# Patient Record
Sex: Male | Born: 1960
Health system: Southern US, Community
[De-identification: ages and names within clinical notes are randomized; demographics above are authoritative.]

## PROBLEM LIST (undated history)

## (undated) DIAGNOSIS — F32A Depression, unspecified: Secondary | ICD-10-CM

## (undated) DIAGNOSIS — F419 Anxiety disorder, unspecified: Secondary | ICD-10-CM

## (undated) DIAGNOSIS — K5792 Diverticulitis of intestine, part unspecified, without perforation or abscess without bleeding: Secondary | ICD-10-CM

## (undated) DIAGNOSIS — F4310 Post-traumatic stress disorder, unspecified: Secondary | ICD-10-CM

## (undated) DIAGNOSIS — N400 Enlarged prostate without lower urinary tract symptoms: Secondary | ICD-10-CM

## (undated) DIAGNOSIS — I213 ST elevation (STEMI) myocardial infarction of unspecified site: Secondary | ICD-10-CM

## (undated) DIAGNOSIS — E7800 Pure hypercholesterolemia, unspecified: Secondary | ICD-10-CM

## (undated) DIAGNOSIS — I513 Intracardiac thrombosis, not elsewhere classified: Secondary | ICD-10-CM

## (undated) DIAGNOSIS — F329 Major depressive disorder, single episode, unspecified: Secondary | ICD-10-CM

## (undated) DIAGNOSIS — I10 Essential (primary) hypertension: Secondary | ICD-10-CM

## (undated) HISTORY — PX: KNEE SURGERY: SHX244

---

## 1998-01-10 ENCOUNTER — Other Ambulatory Visit: Admission: RE | Admit: 1998-01-10 | Discharge: 1998-01-10 | Payer: Self-pay | Admitting: Unknown Physician Specialty

## 1998-02-07 ENCOUNTER — Other Ambulatory Visit: Admission: RE | Admit: 1998-02-07 | Discharge: 1998-02-07 | Payer: Self-pay | Admitting: Unknown Physician Specialty

## 2001-10-01 ENCOUNTER — Emergency Department (HOSPITAL_COMMUNITY): Admission: EM | Admit: 2001-10-01 | Discharge: 2001-10-01 | Payer: Self-pay | Admitting: Emergency Medicine

## 2001-10-01 ENCOUNTER — Encounter: Payer: Self-pay | Admitting: Emergency Medicine

## 2001-10-09 ENCOUNTER — Emergency Department (HOSPITAL_COMMUNITY): Admission: EM | Admit: 2001-10-09 | Discharge: 2001-10-09 | Payer: Self-pay | Admitting: Emergency Medicine

## 2002-01-14 ENCOUNTER — Ambulatory Visit: Admission: RE | Admit: 2002-01-14 | Discharge: 2002-01-14 | Payer: Self-pay | Admitting: Orthopedic Surgery

## 2002-01-14 ENCOUNTER — Encounter: Payer: Self-pay | Admitting: Orthopedic Surgery

## 2004-08-20 ENCOUNTER — Ambulatory Visit: Payer: Self-pay | Admitting: Internal Medicine

## 2004-10-01 ENCOUNTER — Ambulatory Visit: Payer: Self-pay | Admitting: Internal Medicine

## 2004-11-02 ENCOUNTER — Emergency Department (HOSPITAL_COMMUNITY): Admission: EM | Admit: 2004-11-02 | Discharge: 2004-11-02 | Payer: Self-pay | Admitting: Emergency Medicine

## 2004-12-07 ENCOUNTER — Ambulatory Visit: Payer: Self-pay | Admitting: Internal Medicine

## 2004-12-28 ENCOUNTER — Ambulatory Visit: Payer: Self-pay | Admitting: Internal Medicine

## 2005-01-04 ENCOUNTER — Inpatient Hospital Stay (HOSPITAL_COMMUNITY): Admission: EM | Admit: 2005-01-04 | Discharge: 2005-01-06 | Payer: Self-pay | Admitting: Emergency Medicine

## 2005-01-04 ENCOUNTER — Ambulatory Visit (HOSPITAL_COMMUNITY): Admission: RE | Admit: 2005-01-04 | Discharge: 2005-01-04 | Payer: Self-pay | Admitting: Internal Medicine

## 2005-01-05 ENCOUNTER — Ambulatory Visit: Payer: Self-pay | Admitting: Internal Medicine

## 2005-01-23 ENCOUNTER — Ambulatory Visit: Payer: Self-pay | Admitting: Internal Medicine

## 2005-02-28 ENCOUNTER — Ambulatory Visit: Payer: Self-pay | Admitting: Internal Medicine

## 2005-03-01 ENCOUNTER — Encounter: Admission: RE | Admit: 2005-03-01 | Discharge: 2005-03-01 | Payer: Self-pay | Admitting: General Surgery

## 2005-05-20 ENCOUNTER — Ambulatory Visit: Payer: Self-pay | Admitting: Internal Medicine

## 2005-05-21 ENCOUNTER — Ambulatory Visit: Payer: Self-pay | Admitting: Internal Medicine

## 2005-07-22 ENCOUNTER — Ambulatory Visit: Payer: Self-pay | Admitting: Internal Medicine

## 2006-03-21 ENCOUNTER — Ambulatory Visit: Payer: Self-pay | Admitting: Internal Medicine

## 2006-06-27 ENCOUNTER — Ambulatory Visit: Payer: Self-pay | Admitting: Internal Medicine

## 2006-06-27 LAB — CONVERTED CEMR LAB
Hemoglobin, Urine: NEGATIVE
Nitrite: NEGATIVE
Specific Gravity, Urine: <=1.005 (ref 1.000–1.03)
Total Protein, Urine: NEGATIVE mg/dL
Urine Glucose: NEGATIVE mg/dL

## 2006-10-01 ENCOUNTER — Ambulatory Visit: Payer: Self-pay | Admitting: Internal Medicine

## 2006-12-31 ENCOUNTER — Ambulatory Visit: Payer: Self-pay | Admitting: Internal Medicine

## 2007-03-07 ENCOUNTER — Encounter: Payer: Self-pay | Admitting: Internal Medicine

## 2007-03-07 DIAGNOSIS — N508 Other specified disorders of male genital organs: Secondary | ICD-10-CM | POA: Insufficient documentation

## 2007-03-07 DIAGNOSIS — Z8719 Personal history of other diseases of the digestive system: Secondary | ICD-10-CM

## 2007-03-07 DIAGNOSIS — F4310 Post-traumatic stress disorder, unspecified: Secondary | ICD-10-CM

## 2007-03-07 DIAGNOSIS — K219 Gastro-esophageal reflux disease without esophagitis: Secondary | ICD-10-CM

## 2007-05-22 ENCOUNTER — Ambulatory Visit: Payer: Self-pay | Admitting: Internal Medicine

## 2007-05-22 LAB — CONVERTED CEMR LAB
AST: 28 units/L (ref 0–37)
Alkaline Phosphatase: 78 units/L (ref 39–117)
BUN: 13 mg/dL (ref 6–23)
Basophils Absolute: 0.1 10*3/uL (ref 0.0–0.1)
Calcium: 9.1 mg/dL (ref 8.4–10.5)
Crystals: NEGATIVE
Eosinophils Absolute: 0.3 10*3/uL (ref 0.0–0.6)
GFR calc non Af Amer: 85 mL/min
HDL: 25.1 mg/dL — ABNORMAL LOW (ref >39.0)
Hemoglobin, Urine: NEGATIVE
Hemoglobin: 15.2 g/dL (ref 13.0–17.0)
Ketones, ur: NEGATIVE mg/dL
Leukocytes, UA: NEGATIVE
Monocytes Absolute: 0.6 10*3/uL (ref 0.2–0.7)
Mucus, UA: NEGATIVE
Neutro Abs: 2.6 10*3/uL (ref 1.4–7.7)
Neutrophils Relative %: 44.8 % (ref 43.0–77.0)
Platelets: 264 10*3/uL (ref 150–400)
Potassium: 4.2 meq/L (ref 3.5–5.1)
RBC / HPF: NONE SEEN
Squamous Epithelial / LPF: NEGATIVE /lpf
Total Bilirubin: 0.6 mg/dL (ref 0.3–1.2)
Total CHOL/HDL Ratio: 6.9
Total Protein, Urine: NEGATIVE mg/dL
Total Protein: 7.1 g/dL (ref 6.0–8.3)
Triglycerides: 328 mg/dL (ref 0–149)
Urine Glucose: NEGATIVE mg/dL
Vitamin B-12: 440 pg/mL (ref 211–911)
WBC: 5.9 10*3/uL (ref 4.5–10.5)
pH: 5.5 (ref 5.0–8.0)

## 2007-06-03 ENCOUNTER — Encounter: Payer: Self-pay | Admitting: Internal Medicine

## 2007-06-03 ENCOUNTER — Ambulatory Visit: Payer: Self-pay | Admitting: Internal Medicine

## 2007-06-03 DIAGNOSIS — M25519 Pain in unspecified shoulder: Secondary | ICD-10-CM

## 2007-06-03 DIAGNOSIS — F329 Major depressive disorder, single episode, unspecified: Secondary | ICD-10-CM

## 2007-06-03 DIAGNOSIS — F3289 Other specified depressive episodes: Secondary | ICD-10-CM | POA: Insufficient documentation

## 2007-06-03 DIAGNOSIS — F418 Other specified anxiety disorders: Secondary | ICD-10-CM

## 2007-06-03 DIAGNOSIS — N390 Urinary tract infection, site not specified: Secondary | ICD-10-CM

## 2007-08-20 ENCOUNTER — Encounter: Payer: Self-pay | Admitting: Internal Medicine

## 2007-09-17 ENCOUNTER — Ambulatory Visit: Payer: Self-pay | Admitting: Internal Medicine

## 2007-09-21 LAB — CONVERTED CEMR LAB
Bilirubin Urine: NEGATIVE
Hemoglobin, Urine: NEGATIVE
Ketones, ur: NEGATIVE mg/dL
Nitrite: NEGATIVE
Total Protein, Urine: NEGATIVE mg/dL
Urine Glucose: NEGATIVE mg/dL
Urobilinogen, UA: 0.2 (ref 0.0–1.0)
pH: 6 (ref 5.0–8.0)

## 2007-09-23 ENCOUNTER — Ambulatory Visit: Payer: Self-pay | Admitting: Internal Medicine

## 2007-09-23 DIAGNOSIS — L570 Actinic keratosis: Secondary | ICD-10-CM

## 2007-11-19 ENCOUNTER — Encounter: Payer: Self-pay | Admitting: Internal Medicine

## 2008-01-01 ENCOUNTER — Encounter: Payer: Self-pay | Admitting: Internal Medicine

## 2008-02-18 ENCOUNTER — Ambulatory Visit: Payer: Self-pay | Admitting: Internal Medicine

## 2008-05-13 ENCOUNTER — Telehealth: Payer: Self-pay | Admitting: Internal Medicine

## 2008-06-01 ENCOUNTER — Telehealth: Payer: Self-pay | Admitting: Internal Medicine

## 2008-06-20 ENCOUNTER — Ambulatory Visit: Payer: Self-pay | Admitting: Internal Medicine

## 2008-06-21 LAB — CONVERTED CEMR LAB
BUN: 15 mg/dL (ref 6–23)
Calcium: 9.1 mg/dL (ref 8.4–10.5)
Creatinine, Ser: 0.9 mg/dL (ref 0.4–1.5)
GFR calc Af Amer: 115 mL/min
Ketones, ur: NEGATIVE mg/dL
Leukocytes, UA: NEGATIVE
Potassium: 3.9 meq/L (ref 3.5–5.1)
Specific Gravity, Urine: 1.02 (ref 1.000–1.03)
Total Protein, Urine: NEGATIVE mg/dL
Urine Glucose: NEGATIVE mg/dL
Urobilinogen, UA: 0.2 (ref 0.0–1.0)

## 2008-06-22 ENCOUNTER — Ambulatory Visit: Payer: Self-pay | Admitting: Internal Medicine

## 2008-06-22 DIAGNOSIS — N529 Male erectile dysfunction, unspecified: Secondary | ICD-10-CM

## 2009-01-16 ENCOUNTER — Telehealth: Payer: Self-pay | Admitting: Internal Medicine

## 2009-06-18 ENCOUNTER — Emergency Department (HOSPITAL_COMMUNITY): Admission: EM | Admit: 2009-06-18 | Discharge: 2009-06-18 | Payer: Self-pay | Admitting: Emergency Medicine

## 2009-06-30 ENCOUNTER — Ambulatory Visit: Payer: Self-pay | Admitting: Internal Medicine

## 2009-06-30 DIAGNOSIS — J069 Acute upper respiratory infection, unspecified: Secondary | ICD-10-CM | POA: Insufficient documentation

## 2009-06-30 DIAGNOSIS — S61209A Unspecified open wound of unspecified finger without damage to nail, initial encounter: Secondary | ICD-10-CM | POA: Insufficient documentation

## 2009-12-05 ENCOUNTER — Ambulatory Visit: Payer: Self-pay | Admitting: Internal Medicine

## 2010-02-28 ENCOUNTER — Telehealth: Payer: Self-pay | Admitting: Internal Medicine

## 2010-06-27 ENCOUNTER — Telehealth: Payer: Self-pay | Admitting: Internal Medicine

## 2010-09-02 ENCOUNTER — Encounter: Payer: Self-pay | Admitting: Internal Medicine

## 2010-09-13 NOTE — Progress Notes (Signed)
Summary: Rf Lorazepam  Phone Note Refill Request Message from:  Fax from Pharmacy  Refills Requested: Medication #1:  ATIVAN 1 MG  TABS 1 tab 2 - 3  times a day as needed   Dosage confirmed as above?Dosage Confirmed   Supply Requested: #90   Last Refilled: 01/29/2010  Method Requested: Telephone to Pharmacy Next Appointment Scheduled: None Initial call taken by: Lanier Prude, [facility]),  February 28, 2010 3:41 PM  Follow-up for Phone Call        ok 3 ref Follow-up by: Tresa Garter MD,  February 28, 2010 11:03 PM    Prescriptions: ATIVAN 1 MG  TABS (LORAZEPAM) 1 tab 2 - 3  times a day as needed  #90 x 3   Entered by:   Lamar Sprinkles, CMA   Authorized by:   Tresa Garter MD   Signed by:   Lamar Sprinkles, CMA on 03/01/2010   Method used:   Telephoned to ...       [facility]  [phone]* (retail)       [address] [place], [place]  [address]       Ph: [phone] or [phone]       Fax: [phone]   RxID:   [id number]

## 2010-09-13 NOTE — Progress Notes (Signed)
Summary: Rf Lorazepam  Phone Note Refill Request Message from:  Fax from Pharmacy  Refills Requested: Medication #1:  ATIVAN 1 MG  TABS 1 tab 2 - 3  times a day as needed   Dosage confirmed as above?Dosage Confirmed   Supply Requested: 90   Last Refilled: 05/29/2010  Method Requested: Telephone to Pharmacy Next Appointment Scheduled: none Initial call taken by: Lanier Prude, [facility]),  June 27, 2010 11:48 AM  Follow-up for Phone Call        0k 1 ref - needs ov Follow-up by: Tresa Garter MD,  June 27, 2010 12:39 PM  Additional Follow-up for Phone Call Additional follow up Details #1::        Rx called to pharmacy Additional Follow-up by: Lanier Prude, [facility]),  June 27, 2010 2:51 PM    Prescriptions: ATIVAN 1 MG  TABS (LORAZEPAM) 1 tab 2 - 3  times a day as needed  #90 x 0   Entered by:   Lanier Prude, CMA(AAMA)   Authorized by:   Tresa Garter MD   Signed by:   Lanier Prude, CMA(AAMA) on 06/27/2010   Method used:   Telephoned to ...       [facility]  [phone]* (retail)       [address] [place], [place]  [address]       Ph: [phone] or [phone]       Fax: [phone]   RxID:   [id number]

## 2010-12-25 NOTE — Assessment & Plan Note (Signed)
[facility]                           PRIMARY CARE OFFICE NOTE   JEFFERSON, FULLAM                     MRN:          [id number]  DATE:12/31/2006                            DOB:          12-Nov-1960    PROCEDURE:  Cryosurgery.   INDICATION:  Genital warts.  Risks, including a need for a repeat  procedure, infections,scars , and others as well as benefits explained  to the patient in detail and he agreed to proceed.  Three lesions on the  penile skin were treated with liquid nitrogen in the usual fashion.  Tolerated well.   COMPLICATIONS:  None.     Georgina Quint. Plotnikov, MD  Electronically Signed    AVP/MedQ  DD: 01/08/2007  DT: 01/08/2007  Job #: [id number]

## 2010-12-25 NOTE — Letter (Signed)
February 23, 2007    Trial Court Administrator  Attention:  Payton Mccallum Excuse Request  [address]  [place], [place] [address]   RE:  Henry David, Henry David  MRN:  [id number]  /  DOB:  [birth year]   Dr. Magdalen Spatz Court Administrator,   Please excuse my patient, Mr. Garland Hincapie, from jury duty on March 02, 2007, juror [phone] due to medical reasons.    Sincerely,      Georgina Quint. Plotnikov, MD  Electronically Signed    AVP/MedQ  DD: 02/23/2007  DT: 02/24/2007  Job #: [id number]

## 2010-12-28 NOTE — Assessment & Plan Note (Signed)
[facility]                             PRIMARY CARE OFFICE NOTE   Henry David, Henry David                     MRN:          [id number]  DATE:06/27/2006                            DOB:          [birth year]    PROCEDURE:  Irrigation.   INDICATION:  Severe wax impaction bilaterally.   Risks and benefits related to the patient in detail.  He agreed to proceed.  Both ears were irrigated with lukewarm water.  After a significant effort,  both ears were cleared from wax.  He tolerated it well.   COMPLICATIONS:  None.   In case he develops swimmers ear, given a prescription for Cortisporin otic  solution 3 drops each ear three times a day for 5 days.     Georgina Quint. Plotnikov, MD  Electronically Signed    AVP/MedQ  DD: 06/27/2006  DT: 06/27/2006  Job #: [id number]

## 2010-12-28 NOTE — Discharge Summary (Signed)
NAME:  Henry David, Henry David NO.:  [id number]   MEDICAL RECORD NO.:  [id number]          PATIENT TYPE:  INP   LOCATION:  1512                         FACILITY:  [facility]   PHYSICIAN:  Rosalyn Gess. Norins, M.D. [facility] OF BIRTH:  1960-09-24   DATE OF ADMISSION:  01/04/2005  DATE OF DISCHARGE:  01/06/2005                                 DISCHARGE SUMMARY   ADMISSION DIAGNOSIS:  Diverticulitis.   DISCHARGE DIAGNOSES:  Diverticulitis.   HISTORY OF PRESENT ILLNESS:  The patient is a 50 year old Caucasian male who  presented to the office with abdominal pain and fever two to three weeks  prior to admission.  He had an elevated white count at that time and was  treated empirically with Cipro for 10 days per possible UTI versus  diverticulitis.  The patient reports that his fever resolved and abdominal  pain improved but continued to be a problem.  He had increased myalgias and  abdominal pain with sluggishness.  The patient also complained of urinary  frequency.  The patient on CT scan of the date of admission did have what  appeared to be diverticulitis with microperforation and he subsequently  admitted to the hospital.   Please see the History and Physical for Past Medical History, Family History  and Social History.   HOSPITAL COURSE:  The patient was admitted to a regular bed.  He was seen in  consultation by general surgery who felt the patient did not require  surgical intervention but conservative medical management.  The patient was  started on Augmentin.  He had no fever.  He continued to do well with only  mild abdominal discomfort.  The patient was continued on Augmentin for a  full 36 to 48 hours.  On the date of discharge, his white count was 6900,  KUB was ordered and pending.  If negative for any increasing problems with  bowel wall thickening or increased free air he would be discharged to home  with oral course of antibiotics using Augmentin 875 mg b.i.d.   The patient was instructed in the size of diverticulitis as well as food  avoidance in regards to hard nondigestable products such as popcorn kernels  etc.   DISCHARGE EXAMINATION:  Temperature 98.6, blood pressure was stable.  Abdomen has positive bowel sounds noted.  The patient has mild tenderness to  palpation in the left lower quadrant.  Final laboratory with normal  hemoglobin and hematocrit with WBC of 6.9.  KUB is pending at the time of  dictation.   DISPOSITION:  The patient is discharged to home.  He is to follow up with  his primary care physician in 7-10 days.     MEN/MEDQ  D:  01/06/2005  T:  01/06/2005  Job:  [id number]   cc:   Dr. Trinna Post _____________  [facility] _________
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
## 2012-11-27 ENCOUNTER — Encounter (HOSPITAL_COMMUNITY): Payer: Self-pay | Admitting: Emergency Medicine

## 2012-11-27 ENCOUNTER — Emergency Department (HOSPITAL_COMMUNITY)
Admission: EM | Admit: 2012-11-27 | Discharge: 2012-11-27 | Disposition: A | Discharge status: Home / Self Care | Payer: Self-pay | Attending: Emergency Medicine | Admitting: Emergency Medicine

## 2012-11-27 DIAGNOSIS — Z77098 Contact with and (suspected) exposure to other hazardous, chiefly nonmedicinal, chemicals: Secondary | ICD-10-CM | POA: Insufficient documentation

## 2012-11-27 DIAGNOSIS — Z8659 Personal history of other mental and behavioral disorders: Secondary | ICD-10-CM | POA: Insufficient documentation

## 2012-11-27 DIAGNOSIS — F17200 Nicotine dependence, unspecified, uncomplicated: Secondary | ICD-10-CM | POA: Insufficient documentation

## 2012-11-27 DIAGNOSIS — Z8639 Personal history of other endocrine, nutritional and metabolic disease: Secondary | ICD-10-CM | POA: Insufficient documentation

## 2012-11-27 DIAGNOSIS — Z862 Personal history of diseases of the blood and blood-forming organs and certain disorders involving the immune mechanism: Secondary | ICD-10-CM | POA: Insufficient documentation

## 2012-11-27 HISTORY — DX: Major depressive disorder, single episode, unspecified: F32.9

## 2012-11-27 HISTORY — DX: Anxiety disorder, unspecified: F41.9

## 2012-11-27 HISTORY — DX: Depression, unspecified: F32.A

## 2012-11-27 HISTORY — DX: Post-traumatic stress disorder, unspecified: F43.10

## 2012-11-27 HISTORY — DX: Pure hypercholesterolemia, unspecified: E78.00

## 2012-11-27 NOTE — ED Provider Notes (Signed)
History    This chart was scribed for non-physician practitioner working with Gwyneth Sprout, MD by Toya Smothers, ED Scribe. This patient was seen in room TR05C/TR05C and the patient's care was started at 5:1 PM.  CSN: [id number]  Arrival date & time 11/27/12  1530   First MD Initiated Contact with Patient 11/27/12 1703      No chief complaint on file.  Patient is a 52 y.o. male presenting with eye problem. The history is provided by the patient. No language interpreter was used.  Eye Problem Location:  Both Quality:  Dull Severity:  Mild Onset quality:  Gradual Duration:  3 hours Timing:  Constant Progression:  Unchanged Chronicity:  New Context: chemical exposure   Relieved by:  Flushing Ineffective treatments:  None tried Associated symptoms: itching   Associated symptoms: no blurred vision, no decreased vision, no discharge, no double vision, no facial rash, no photophobia and no redness   Risk factors: no previous injury to eye     KAVAUGHN FAUCETT is a 52 y.o. male with h/o Anxiety, Depression, and PTSD, who presents to the ED c/o 3 hours of gradual onset, unchanged, mild eye irritation after thoroughly wiping his face and neck with a disposable germicidal sheet containing chlorine. Pt was taken to an eye wash station where symptoms became worse.No redness, itching, or visual disturbance. Pain has not been treated prior to arrival. No fever, chills, cough, congestion, rhinorrhea, chest pain, SOB, or n/v/d. Pt is a current everyday smoker admitting alcohol use, and denying illicit drug use.     Past Medical History  Diagnosis Date  . Anxiety   . Depression   . Post traumatic stress disorder   . Elevated cholesterol     Past Surgical History  Procedure Laterality Date  . Knee surgery      No family history on file.  History  Substance Use Topics  . Smoking status: Current Every Day Smoker  . Smokeless tobacco: Not on file  . Alcohol Use: Yes      Review of  Systems  Eyes: Positive for itching. Negative for blurred vision, double vision, photophobia, discharge and redness.  All other systems reviewed and are negative.    Allergies  Coconut fatty acids  Home Medications   Current Outpatient Rx  Name  Route  Sig  Dispense  Refill  . Ibuprofen (IBU PO)   Oral   Take 4 tablets by mouth daily as needed (pain).           BP 139/79  Pulse 90  Temp(Src) 98 F (36.7 C) (Oral)  SpO2 96%  Physical Exam  Nursing note and vitals reviewed. Constitutional: He is oriented to person, place, and time. He appears well-developed and well-nourished. No distress.  HENT:  Head: Normocephalic and atraumatic.  No swelling of the lids of either R or L eye.  Eyes: Conjunctivae and EOM are normal. Right eye exhibits no discharge. Left eye exhibits no discharge. No scleral icterus.  Neck: Neck supple. No tracheal deviation present.  Cardiovascular: Normal rate.   Capillary refill is less than 3 seconds.  Pulmonary/Chest: Effort normal. No respiratory distress. He has no wheezes. He has no rales.  Symmetrical rise and fall of the chest.  Musculoskeletal: Normal range of motion.  Neurological: He is alert and oriented to person, place, and time.  Skin: Skin is warm and dry.  No rash about the face or the neck. No rash on the hands.  Psychiatric: He has a  normal mood and affect. His behavior is normal.    ED Course  Procedures DIAGNOSTIC STUDIES: Oxygen Saturation is 96% on room air, normal by my interpretation.    COORDINATION OF CARE: 17:11- Evaluated Pt. Pt is awake, alert, and without distress. 17:17- Patient  understands and agrees with initial ED impression and plan with expectations set for ED visit.     Labs Reviewed - No data to display No results found.   No diagnosis found.    MDM  I have reviewed nursing notes, vital signs, and all appropriate lab and imaging results for this patient. Patient weighed hands, face, and neck  with industrial cleaner/sensitizer by mistake. Patient states that he had mild itching around the eyes and some redness. He received irrigation and flush prior to arriving in the fast track area. He states that he feels some better now. Patient states that he is somewhat anxious because he has not received his medications lately, as well as his mother is currently being treated for a possible stroke.  Visual acuity noted. No acute changes noted on examination. Patient is advised to continue to irrigate the face and eyes. He is to return to the emergency department if any changes, problems, or concerns.     *I personally performed the services described in this documentation, which was scribed in my presence. The recorded information has been reviewed and is accurate.  Kathie Dike, PA-C 11/27/12 1737

## 2012-11-27 NOTE — ED Notes (Signed)
PA at bedside.

## 2012-11-27 NOTE — ED Notes (Signed)
Pt visitor in the hospital. Wiped his face and eyes with germicidal wipes. Pt reportedly was taken to an eye wash station and irrigated his eyes. Eyes appear slightly reddened. Vision intact. Skin without blistering or significant redness.

## 2012-11-28 NOTE — ED Provider Notes (Signed)
Medical screening examination/treatment/procedure(s) were performed by non-physician practitioner and as supervising physician I was immediately available for consultation/collaboration.   Shyonna Carlin, MD 11/28/12 0030 

## 2015-07-09 ENCOUNTER — Inpatient Hospital Stay (HOSPITAL_COMMUNITY)
Admission: EM | Admit: 2015-07-09 | Discharge: 2015-07-13 | DRG: 247 | Disposition: A | Discharge status: Home / Self Care | Payer: Self-pay | Attending: Cardiovascular Disease | Admitting: Cardiovascular Disease

## 2015-07-09 ENCOUNTER — Encounter (HOSPITAL_COMMUNITY)
Admission: EM | Disposition: A | Discharge status: Home / Self Care | Payer: Self-pay | Source: Home / Self Care | Attending: Cardiovascular Disease

## 2015-07-09 ENCOUNTER — Encounter (HOSPITAL_COMMUNITY): Payer: Self-pay | Admitting: Emergency Medicine

## 2015-07-09 ENCOUNTER — Emergency Department (HOSPITAL_COMMUNITY): Payer: Self-pay

## 2015-07-09 DIAGNOSIS — I237 Postinfarction angina: Secondary | ICD-10-CM | POA: Diagnosis not present

## 2015-07-09 DIAGNOSIS — I2102 ST elevation (STEMI) myocardial infarction involving left anterior descending coronary artery: Principal | ICD-10-CM | POA: Diagnosis present

## 2015-07-09 DIAGNOSIS — Z91018 Allergy to other foods: Secondary | ICD-10-CM

## 2015-07-09 DIAGNOSIS — E785 Hyperlipidemia, unspecified: Secondary | ICD-10-CM | POA: Diagnosis present

## 2015-07-09 DIAGNOSIS — F329 Major depressive disorder, single episode, unspecified: Secondary | ICD-10-CM | POA: Diagnosis present

## 2015-07-09 DIAGNOSIS — I213 ST elevation (STEMI) myocardial infarction of unspecified site: Secondary | ICD-10-CM

## 2015-07-09 DIAGNOSIS — R7303 Prediabetes: Secondary | ICD-10-CM | POA: Diagnosis present

## 2015-07-09 DIAGNOSIS — I10 Essential (primary) hypertension: Secondary | ICD-10-CM | POA: Diagnosis present

## 2015-07-09 DIAGNOSIS — I25119 Atherosclerotic heart disease of native coronary artery with unspecified angina pectoris: Secondary | ICD-10-CM | POA: Diagnosis present

## 2015-07-09 DIAGNOSIS — F418 Other specified anxiety disorders: Secondary | ICD-10-CM | POA: Diagnosis present

## 2015-07-09 DIAGNOSIS — I2510 Atherosclerotic heart disease of native coronary artery without angina pectoris: Secondary | ICD-10-CM

## 2015-07-09 DIAGNOSIS — Z7982 Long term (current) use of aspirin: Secondary | ICD-10-CM

## 2015-07-09 DIAGNOSIS — Z9861 Coronary angioplasty status: Secondary | ICD-10-CM

## 2015-07-09 DIAGNOSIS — F4310 Post-traumatic stress disorder, unspecified: Secondary | ICD-10-CM | POA: Diagnosis present

## 2015-07-09 DIAGNOSIS — E7800 Pure hypercholesterolemia, unspecified: Secondary | ICD-10-CM | POA: Diagnosis present

## 2015-07-09 DIAGNOSIS — Z955 Presence of coronary angioplasty implant and graft: Secondary | ICD-10-CM | POA: Diagnosis not present

## 2015-07-09 DIAGNOSIS — Z79899 Other long term (current) drug therapy: Secondary | ICD-10-CM

## 2015-07-09 DIAGNOSIS — F17210 Nicotine dependence, cigarettes, uncomplicated: Secondary | ICD-10-CM | POA: Diagnosis present

## 2015-07-09 DIAGNOSIS — F411 Generalized anxiety disorder: Secondary | ICD-10-CM | POA: Diagnosis present

## 2015-07-09 DIAGNOSIS — I25118 Atherosclerotic heart disease of native coronary artery with other forms of angina pectoris: Secondary | ICD-10-CM | POA: Diagnosis present

## 2015-07-09 HISTORY — PX: CARDIAC CATHETERIZATION: SHX172

## 2015-07-09 LAB — BASIC METABOLIC PANEL
ANION GAP: 10 (ref 5–15)
BUN: 17 mg/dL (ref 6–20)
CHLORIDE: 100 mmol/L — AB (ref 101–111)
CO2: 25 mmol/L (ref 22–32)
Calcium: 9.9 mg/dL (ref 8.9–10.3)
Creatinine, Ser: 0.95 mg/dL (ref 0.61–1.24)
GFR calc non Af Amer: >60 mL/min (ref >60)
Glucose, Bld: 122 mg/dL — ABNORMAL HIGH (ref 65–99)
POTASSIUM: 4.1 mmol/L (ref 3.5–5.1)
SODIUM: 135 mmol/L (ref 135–145)

## 2015-07-09 LAB — CBC
HEMATOCRIT: 46.8 % (ref 39.0–52.0)
HEMOGLOBIN: 16.3 g/dL (ref 13.0–17.0)
MCH: 30.6 pg (ref 26.0–34.0)
MCHC: 34.8 g/dL (ref 30.0–36.0)
MCV: 87.8 fL (ref 78.0–100.0)
PLATELETS: 276 10*3/uL (ref 150–400)
RBC: 5.33 MIL/uL (ref 4.22–5.81)
RDW: 12.9 % (ref 11.5–15.5)
WBC: 9.2 10*3/uL (ref 4.0–10.5)

## 2015-07-09 LAB — TROPONIN I
TROPONIN I: 0.22 ng/mL — AB (ref <0.031)
TROPONIN I: 0.98 ng/mL — AB (ref <0.031)
Troponin I: 0.1 ng/mL — ABNORMAL HIGH (ref <0.031)

## 2015-07-09 LAB — D-DIMER, QUANTITATIVE (NOT AT ARMC): D DIMER QUANT: 0.34 ug{FEU}/mL (ref 0.00–0.50)

## 2015-07-09 LAB — APTT: APTT: 31 s (ref 24–37)

## 2015-07-09 LAB — I-STAT TROPONIN, ED: Troponin i, poc: 0.09 ng/mL (ref 0.00–0.08)

## 2015-07-09 LAB — PROTIME-INR
INR: 0.93 (ref 0.00–1.49)
Prothrombin Time: 12.7 seconds (ref 11.6–15.2)

## 2015-07-09 SURGERY — LEFT HEART CATH AND CORONARY ANGIOGRAPHY
Anesthesia: LOCAL

## 2015-07-09 MED ORDER — SODIUM CHLORIDE 0.9 % IJ SOLN: 3.0000 mL | INTRAMUSCULAR | Status: DC | PRN

## 2015-07-09 MED ORDER — MIDAZOLAM HCL 2 MG/2ML IJ SOLN
INTRAMUSCULAR | Status: DC | PRN
  Administered 2015-07-09: 1 mg via INTRAVENOUS

## 2015-07-09 MED ORDER — NITROGLYCERIN 1 MG/10 ML FOR IR/CATH LAB
INTRA_ARTERIAL | Status: DC | PRN
  Administered 2015-07-09: 21:00:00

## 2015-07-09 MED ORDER — ONDANSETRON HCL 4 MG/2ML IJ SOLN
4.0000 mg | Freq: Four times a day (QID) | INTRAMUSCULAR | Status: DC | PRN
  Administered 2015-07-09 – 2015-07-11 (×5): 4 mg via INTRAVENOUS
  Filled 2015-07-09 (×5): qty 2

## 2015-07-09 MED ORDER — HEPARIN SODIUM (PORCINE) 5000 UNIT/ML IJ SOLN
5000.0000 [IU] | Freq: Once | INTRAMUSCULAR | Status: AC
  Administered 2015-07-09: 5000 [IU] via SUBCUTANEOUS

## 2015-07-09 MED ORDER — ASPIRIN EC 81 MG PO TBEC
81.0000 mg | DELAYED_RELEASE_TABLET | Freq: Every day | ORAL | Status: DC
  Administered 2015-07-10: 81 mg via ORAL
  Filled 2015-07-09: qty 1

## 2015-07-09 MED ORDER — LISINOPRIL 2.5 MG PO TABS
2.5000 mg | ORAL_TABLET | Freq: Every day | ORAL | Status: DC
Start: 2015-07-10 — End: 2015-07-10
  Administered 2015-07-10: 2.5 mg via ORAL
  Filled 2015-07-09: qty 1

## 2015-07-09 MED ORDER — METOPROLOL TARTRATE 12.5 MG HALF TABLET
12.5000 mg | ORAL_TABLET | Freq: Two times a day (BID) | ORAL | Status: DC
  Administered 2015-07-09 – 2015-07-10 (×2): 12.5 mg via ORAL
  Filled 2015-07-09 (×2): qty 1

## 2015-07-09 MED ORDER — ACETAMINOPHEN 325 MG PO TABS
650.0000 mg | ORAL_TABLET | ORAL | Status: DC | PRN
  Administered 2015-07-11 – 2015-07-12 (×4): 650 mg via ORAL
  Filled 2015-07-09 (×4): qty 2

## 2015-07-09 MED ORDER — NITROGLYCERIN 0.4 MG SL SUBL
0.4000 mg | SUBLINGUAL_TABLET | SUBLINGUAL | Status: AC | PRN
  Administered 2015-07-09 (×3): 0.4 mg via SUBLINGUAL
  Filled 2015-07-09: qty 1

## 2015-07-09 MED ORDER — MORPHINE SULFATE (PF) 2 MG/ML IV SOLN
2.0000 mg | INTRAVENOUS | Status: DC | PRN
Start: 2015-07-09 — End: 2015-07-13
  Administered 2015-07-09 – 2015-07-10 (×2): 4 mg via INTRAVENOUS
  Administered 2015-07-10 – 2015-07-12 (×8): 2 mg via INTRAVENOUS
  Filled 2015-07-09: qty 2
  Filled 2015-07-09 (×2): qty 1
  Filled 2015-07-09: qty 2
  Filled 2015-07-09 (×7): qty 1

## 2015-07-09 MED ORDER — NITROGLYCERIN IN D5W 200-5 MCG/ML-% IV SOLN: 0.0000 ug/min | INTRAVENOUS | Status: DC

## 2015-07-09 MED ORDER — TICAGRELOR 90 MG PO TABS
ORAL_TABLET | ORAL | Status: DC | PRN
  Administered 2015-07-09: 180 mg via ORAL

## 2015-07-09 MED ORDER — BIVALIRUDIN 250 MG IV SOLR
INTRAVENOUS | Status: AC
  Filled 2015-07-09: qty 250

## 2015-07-09 MED ORDER — NITROGLYCERIN IN D5W 200-5 MCG/ML-% IV SOLN: 5.0000 ug/min | INTRAVENOUS | Status: DC

## 2015-07-09 MED ORDER — ASPIRIN 81 MG PO CHEW
CHEWABLE_TABLET | ORAL | Status: AC
  Filled 2015-07-09: qty 4

## 2015-07-09 MED ORDER — FENTANYL CITRATE (PF) 100 MCG/2ML IJ SOLN
INTRAMUSCULAR | Status: DC | PRN
  Administered 2015-07-09 (×2): 25 ug via INTRAVENOUS

## 2015-07-09 MED ORDER — ASPIRIN 81 MG PO CHEW
324.0000 mg | CHEWABLE_TABLET | Freq: Once | ORAL | Status: AC
Start: 2015-07-09 — End: 2015-07-09
  Administered 2015-07-09: 324 mg via ORAL

## 2015-07-09 MED ORDER — HEPARIN (PORCINE) IN NACL 2-0.9 UNIT/ML-% IJ SOLN
INTRAMUSCULAR | Status: AC
  Filled 2015-07-09: qty 1000

## 2015-07-09 MED ORDER — SODIUM CHLORIDE 0.9 % IJ SOLN
3.0000 mL | Freq: Two times a day (BID) | INTRAMUSCULAR | Status: DC
  Administered 2015-07-10 – 2015-07-11 (×4): 3 mL via INTRAVENOUS

## 2015-07-09 MED ORDER — TICAGRELOR 90 MG PO TABS
ORAL_TABLET | ORAL | Status: AC
Start: 2015-07-09 — End: 2015-07-09
  Filled 2015-07-09: qty 1

## 2015-07-09 MED ORDER — ATORVASTATIN CALCIUM 80 MG PO TABS
80.0000 mg | ORAL_TABLET | Freq: Every day | ORAL | Status: DC
  Administered 2015-07-10 – 2015-07-12 (×3): 80 mg via ORAL
  Filled 2015-07-09 (×4): qty 1

## 2015-07-09 MED ORDER — SODIUM CHLORIDE 0.9 % IV SOLN
1.7500 mg/kg/h | INTRAVENOUS | Status: AC
  Administered 2015-07-09 – 2015-07-10 (×2): 1.75 mg/kg/h via INTRAVENOUS
  Filled 2015-07-09 (×2): qty 250

## 2015-07-09 MED ORDER — HEPARIN (PORCINE) IN NACL 100-0.45 UNIT/ML-% IJ SOLN: 12.0000 [IU]/kg/h | INTRAMUSCULAR | Status: DC

## 2015-07-09 MED ORDER — MORPHINE SULFATE (PF) 4 MG/ML IV SOLN
INTRAVENOUS | Status: AC
  Filled 2015-07-09: qty 1

## 2015-07-09 MED ORDER — HEPARIN (PORCINE) IN NACL 100-0.45 UNIT/ML-% IJ SOLN
1000.0000 [IU]/h | INTRAMUSCULAR | Status: DC
Start: 2015-07-09 — End: 2015-07-09
  Filled 2015-07-09: qty 250

## 2015-07-09 MED ORDER — NITROGLYCERIN IN D5W 200-5 MCG/ML-% IV SOLN
0.0000 ug/min | INTRAVENOUS | Status: DC
  Administered 2015-07-09: 5 ug/min via INTRAVENOUS

## 2015-07-09 MED ORDER — SODIUM CHLORIDE 0.9 % IV SOLN: 250.0000 mL | INTRAVENOUS | Status: DC | PRN

## 2015-07-09 MED ORDER — LIDOCAINE HCL (PF) 1 % IJ SOLN
INTRAMUSCULAR | Status: AC
  Filled 2015-07-09: qty 30

## 2015-07-09 MED ORDER — SODIUM CHLORIDE 0.9 % IV SOLN
250.0000 mg | INTRAVENOUS | Status: DC | PRN
  Administered 2015-07-09 (×2): 1.75 mg/kg/h via INTRAVENOUS

## 2015-07-09 MED ORDER — MORPHINE SULFATE (PF) 4 MG/ML IV SOLN
4.0000 mg | Freq: Once | INTRAVENOUS | Status: AC
  Administered 2015-07-09: 4 mg via INTRAVENOUS

## 2015-07-09 MED ORDER — NITROGLYCERIN 1 MG/10 ML FOR IR/CATH LAB
INTRA_ARTERIAL | Status: AC
  Filled 2015-07-09: qty 10

## 2015-07-09 MED ORDER — IOHEXOL 350 MG/ML SOLN
INTRAVENOUS | Status: DC | PRN
  Administered 2015-07-09: 345 mL via INTRAVENOUS

## 2015-07-09 MED ORDER — BIVALIRUDIN BOLUS VIA INFUSION - CUPID
INTRAVENOUS | Status: DC | PRN
  Administered 2015-07-09: 70.725 mg via INTRAVENOUS

## 2015-07-09 MED ORDER — HEPARIN SODIUM (PORCINE) 5000 UNIT/ML IJ SOLN: 60.0000 [IU]/kg | Freq: Once | INTRAMUSCULAR | Status: DC

## 2015-07-09 MED ORDER — TICAGRELOR 90 MG PO TABS
90.0000 mg | ORAL_TABLET | Freq: Two times a day (BID) | ORAL | Status: DC
  Administered 2015-07-10: 90 mg via ORAL
  Filled 2015-07-09: qty 1

## 2015-07-09 MED ORDER — TICAGRELOR 90 MG PO TABS
ORAL_TABLET | ORAL | Status: AC
  Filled 2015-07-09: qty 1

## 2015-07-09 MED ORDER — FENTANYL CITRATE (PF) 100 MCG/2ML IJ SOLN
INTRAMUSCULAR | Status: AC
  Filled 2015-07-09: qty 2

## 2015-07-09 MED ORDER — HEPARIN BOLUS VIA INFUSION
4000.0000 [IU] | Freq: Once | INTRAVENOUS | Status: DC
  Filled 2015-07-09: qty 4000

## 2015-07-09 MED ORDER — LIDOCAINE HCL (PF) 1 % IJ SOLN
INTRAMUSCULAR | Status: DC | PRN
  Administered 2015-07-09: 20 mL

## 2015-07-09 MED ORDER — HEPARIN (PORCINE) IN NACL 2-0.9 UNIT/ML-% IJ SOLN
INTRAMUSCULAR | Status: DC | PRN
  Administered 2015-07-09: 20:00:00

## 2015-07-09 MED ORDER — MORPHINE SULFATE (PF) 4 MG/ML IV SOLN: 4.0000 mg | Freq: Once | INTRAVENOUS | Status: DC

## 2015-07-09 MED ORDER — METOPROLOL TARTRATE 1 MG/ML IV SOLN
INTRAVENOUS | Status: AC
  Filled 2015-07-09: qty 5

## 2015-07-09 MED ORDER — MORPHINE SULFATE (PF) 2 MG/ML IV SOLN
INTRAVENOUS | Status: AC
  Filled 2015-07-09: qty 1

## 2015-07-09 MED ORDER — MIDAZOLAM HCL 2 MG/2ML IJ SOLN
INTRAMUSCULAR | Status: AC
  Filled 2015-07-09: qty 2

## 2015-07-09 MED ORDER — SODIUM CHLORIDE 0.9 % IV SOLN
INTRAVENOUS | Status: DC
  Administered 2015-07-09 – 2015-07-10 (×2): via INTRAVENOUS

## 2015-07-09 MED ORDER — NITROGLYCERIN IN D5W 200-5 MCG/ML-% IV SOLN
INTRAVENOUS | Status: AC
  Filled 2015-07-09: qty 250

## 2015-07-09 SURGICAL SUPPLY — 19 items
BALLN EUPHORA RX 2.5X12 (BALLOONS) ×2
BALLN EUPHORA RX 3.0X15 (BALLOONS) ×2
BALLN ~~LOC~~ TREK RX 3.0X12 (BALLOONS) ×2
BALLOON EUPHORA RX 2.5X12 (BALLOONS) IMPLANT
BALLOON EUPHORA RX 3.0X15 (BALLOONS) IMPLANT
BALLOON ~~LOC~~ TREK RX 3.0X12 (BALLOONS) IMPLANT
CATH INFINITI 5FR MULTPACK ANG (CATHETERS) ×1 IMPLANT
CATH VISTA GUIDE 6FR XBLAD3.5 (CATHETERS) ×1 IMPLANT
KIT ENCORE 26 ADVANTAGE (KITS) ×1 IMPLANT
KIT HEART LEFT (KITS) ×2 IMPLANT
PACK CARDIAC CATHETERIZATION (CUSTOM PROCEDURE TRAY) ×2 IMPLANT
SHEATH PINNACLE 6F 10CM (SHEATH) ×1 IMPLANT
STENT XIENCE ALPINE RX 2.75X18 (Permanent Stent) ×1 IMPLANT
SYR MEDRAD MARK V 150ML (SYRINGE) ×2 IMPLANT
TRANSDUCER W/STOPCOCK (MISCELLANEOUS) ×2 IMPLANT
TUBING CIL FLEX 10 FLL-RA (TUBING) ×2 IMPLANT
WIRE COUGAR XT STRL 190CM (WIRE) ×1 IMPLANT
WIRE EMERALD 3MM-J .035X150CM (WIRE) ×1 IMPLANT
WIRE PT2 MS 185 (WIRE) ×1 IMPLANT

## 2015-07-09 NOTE — ED Notes (Signed)
MD at bedside. 

## 2015-07-09 NOTE — H&P (Signed)
Chief Complaint:  [name] is a 30 white male who was transferred from [facility] long emergency room for emergent cardiac catheterization with anterolateral ST segment elevation myocardial infarction.   HPI:  [name] denies any known prior cardiac history.  He has a history of anxiety and depression and stress disorder.  He has not seen a physician in years.  In the past.  He was told of having high cholesterol but has not never received treatment.  Reactive tobacco use.  Today between noon and 1 PM, he developed chest discomfort.  He initially presented to [facility] ER at ~ 4 PM.  Initially was felt that his chest pain was somewhat atypical and he was not taken back to be evaluated immediately.  His initial ECG did not show any definitive diagnostic changes, but there was a suggestion of subtle inferior J-point elevation.  His point of care marker came back minimally positive.  Several hours later.  Subsequent ECG was done with troponin level and his ECG now showed ST elevation anterolaterally code STEMI was called.  He was transported emergently to [facility] catheterization laboratory for emergent cardiac catheterization.  PMHx:  Past Medical History  Diagnosis Date  . Anxiety   . Depression   . Post traumatic stress disorder   . Elevated cholesterol     Past Surgical History  Procedure Laterality Date  . Knee surgery      FAMHx:  History reviewed. No pertinent family history.  SOCHx:   reports that he has been smoking.  He does not have any smokeless tobacco history on file. He reports that he drinks alcohol. He reports that he does not use illicit drugs.  ALLERGIES:  Allergies  Allergen Reactions  . Coconut Fatty Acids     unknown    ROS: General: Negative; No fevers, chills, or night sweats;  HEENT: Negative; No changes in vision or hearing, sinus congestion, difficulty swallowing Pulmonary: Negative; No cough, wheezing, shortness of breath,  hemoptysis Cardiovascular:  See HPI GI: Negative; No nausea, vomiting, diarrhea, or abdominal pain GU: Negative; No dysuria, hematuria, or difficulty voiding Musculoskeletal: Negative; no myalgias, joint pain, or weakness Hematologic/Oncology: Negative; no easy bruising, bleeding Endocrine: Negative; no heat/cold intolerance; no diabetes Neuro: Negative; no changes in balance, headaches Skin: Negative; No rashes or skin lesions Psychiatric: Positive for depression depression Sleep: Negative; No snoring, daytime sleepiness, hypersomnolence, bruxism, restless legs, hypnogognic hallucinations, no cataplexy Other comprehensive 14 point system review is negative.  HOME MEDS: Medications Prior to Admission  Medication Sig Dispense Refill  . aspirin 81 MG tablet Take 2-3 mg by mouth once.    . Doxylamine Succinate, Sleep, (SLEEP AID PO) Take 1 tablet by mouth at bedtime as needed (sleep).    Marland Kitchen ibuprofen (ADVIL,MOTRIN) 200 MG tablet Take 200 mg by mouth every 6 (six) hours as needed for moderate pain.    . Pseudoephedrine HCl (DECONGESTANT PO) Take 1-2 tablets by mouth daily as needed (help breathe).      LABS/IMAGING: Results for orders placed or performed during the hospital encounter of 07/09/15 (from the past 48 hour(s))  Basic metabolic panel     Status: Abnormal   Collection Time: 07/09/15  5:25 PM  Result Value Ref Range   Sodium 135 135 - 145 mmol/L   Potassium 4.1 3.5 - 5.1 mmol/L   Chloride 100 (L) 101 - 111 mmol/L   CO2 25 22 - 32 mmol/L   Glucose, Bld 122 (H) 65 - 99 mg/dL  BUN 17 6 - 20 mg/dL   Creatinine, Ser 0.95 0.61 - 1.24 mg/dL   Calcium 9.9 8.9 - 10.3 mg/dL   GFR calc non Af Amer >60 >60 mL/min   GFR calc Af Amer >60 >60 mL/min    Comment: (NOTE) The eGFR has been calculated using the CKD EPI equation. This calculation has not been validated in all clinical situations. eGFR's persistently <60 mL/min signify possible Chronic Kidney Disease.    Anion gap 10 5 - 15   CBC     Status: None   Collection Time: 07/09/15  5:25 PM  Result Value Ref Range   WBC 9.2 4.0 - 10.5 K/uL   RBC 5.33 4.22 - 5.81 MIL/uL   Hemoglobin 16.3 13.0 - 17.0 g/dL   HCT 46.8 39.0 - 52.0 %   MCV 87.8 78.0 - 100.0 fL   MCH 30.6 26.0 - 34.0 pg   MCHC 34.8 30.0 - 36.0 g/dL   RDW 12.9 11.5 - 15.5 %   Platelets 276 150 - 400 K/uL  D-dimer, quantitative (not at [facility])     Status: None   Collection Time: 07/09/15  5:25 PM  Result Value Ref Range   D-Dimer, Quant 0.34 0.00 - 0.50 ug/mL-FEU    Comment: Please note change in reference range. (NOTE) At the manufacturer cut-off of 0.50 ug/mL FEU, this assay has been documented to exclude PE with a sensitivity and negative predictive value of 97 to 99%.  At this time, this assay has not been approved by the FDA to exclude DVT/VTE. Results should be correlated with clinical presentation.   Troponin I     Status: Abnormal   Collection Time: 07/09/15  5:25 PM  Result Value Ref Range   Troponin I 0.10 (H) <0.031 ng/mL    Comment:        PERSISTENTLY INCREASED TROPONIN VALUES IN THE RANGE OF 0.04-0.49 ng/mL CAN BE SEEN IN:       -UNSTABLE ANGINA       -CONGESTIVE HEART FAILURE       -MYOCARDITIS       -CHEST TRAUMA       -ARRYHTHMIAS       -LATE PRESENTING MYOCARDIAL INFARCTION       -COPD   CLINICAL FOLLOW-UP RECOMMENDED.   I-stat troponin, ED (not at [facility])     Status: Abnormal   Collection Time: 07/09/15  5:55 PM  Result Value Ref Range   Troponin i, poc 0.09 (HH) 0.00 - 0.08 ng/mL   Comment NOTIFIED PHYSICIAN    Comment 3            Comment: Due to the release kinetics of cTnI, a negative result within the first hours of the onset of symptoms does not rule out myocardial infarction with certainty. If myocardial infarction is still suspected, repeat the test at appropriate intervals.   APTT     Status: None   Collection Time: 07/09/15  7:42 PM  Result Value Ref Range   aPTT 31 24 - 37 seconds  Protime-INR      Status: None   Collection Time: 07/09/15  7:42 PM  Result Value Ref Range   Prothrombin Time 12.7 11.6 - 15.2 seconds   INR 0.93 0.00 - 1.49  Troponin I     Status: Abnormal   Collection Time: 07/09/15  7:42 PM  Result Value Ref Range   Troponin I 0.22 (H) <0.031 ng/mL    Comment:  PERSISTENTLY INCREASED TROPONIN VALUES IN THE RANGE OF 0.04-0.49 ng/mL CAN BE SEEN IN:       -UNSTABLE ANGINA       -CONGESTIVE HEART FAILURE       -MYOCARDITIS       -CHEST TRAUMA       -ARRYHTHMIAS       -LATE PRESENTING MYOCARDIAL INFARCTION       -COPD   CLINICAL FOLLOW-UP RECOMMENDED.    Dg Chest 2 View  07/09/2015  CLINICAL DATA:  Left-sided chest pain EXAM: CHEST  2 VIEW COMPARISON:  None. FINDINGS: The mild cardiac silhouette enlargement. Normal vascular pattern. Lungs clear. IMPRESSION: Mild enlargement of cardiac silhouette.  Otherwise negative. Electronically Signed   By: Skipper Cliche M.D.   On: 07/09/2015 19:13    VITALS: Blood pressure 140/91, pulse 92, temperature 97.8 F (36.6 C), temperature source Oral, resp. rate 11, height $RemoveBe'5\' 11"'ILhnuocCo$  (1.803 m), weight 208 lb (94.348 kg), SpO2 99 %.  EXAM: General appearance: alert, cooperative and Complaining of 9 out of 10 chest pain on initial presentation. Neck: no adenopathy, no carotid bruit, no JVD, supple, symmetrical, trachea midline and thyroid not enlarged, symmetric, no tenderness/mass/nodules Lungs: No wheezing or rhonchi Heart: regular rate and rhythm and .  No S3 or S4. Abdomen: soft, non-tender; bowel sounds normal; no masses,  no organomegaly Extremities: no edema, redness or tenderness in the calves or thighs Pulses: 2+ and symmetric Skin: Skin color, texture, turgor normal. No rashes or lesions Neurologic: Grossly normal   ECG at 8:01 pm(independently read by me): Acute anterolateral injury current. V3- through V5 and 0.5-1 mm inferior ST elevation inferiorly  [facility] (independently reviewed by me):  Normal sinus rhythm at 80.  Less than 1 mm J-point elevation inferiorly.  QS complex V1 V2.  IMPRESSION: 1. Acute coronary syndrome with acute ST segment anterolateral myocardial infarction, most likely due to LAD/diagonal disease.  However, the patient also has subtle inferior J-point elevation of < 47mm.   2.  History of hyperlipidemia  3.  History of anxiety/depression   PLAN: Emergent cardiac catheterization and possible coronary intervention.  Troy Sine, MD, [facility] 07/09/2015  10:42 PM

## 2015-07-09 NOTE — Progress Notes (Signed)
ANTICOAGULATION CONSULT NOTE Pharmacy Consult for heparin Indication: CAD s/p PCI  Allergies  Allergen Reactions  . Coconut Fatty Acids     unknown    Patient Measurements: Height: 5\' 11"  (180.3 cm) Weight: 208 lb (94.348 kg) (patient estimate) IBW/kg (Calculated) : 75.3  Vital Signs: Temp: 97.8 F (36.6 C) (11/27 2011) Temp Source: Oral (11/27 2011) BP: 140/91 mmHg (11/27 2151) Pulse Rate: 92 (11/27 2151)  Labs:  Recent Labs  07/09/15 1725 07/09/15 1942  HGB 16.3  --   HCT 46.8  --   PLT 276  --   APTT  --  31  LABPROT  --  12.7  INR  --  0.93  CREATININE 0.95  --   TROPONINI 0.10* 0.22*    Estimated Creatinine Clearance: 101.8 mL/min (by C-G formula based on Cr of 0.95).  Assessment: 54 yo male with STEMI s/p PCI for anticoagulation.  Angiomax to continue for total of 4 hours.  Heparin to be resumed 8 hours after sheath removal while awaiting staged PCI  Goal of Therapy:  Heparin level 0.3-0.7 units/ml Monitor platelets by anticoagulation protocol: Yes   Plan:  F/U sheath removal later tonight, then restart heparin 1350 units/hr . Check heparin level 8 hours after resuming.   Geannie Risen, PharmD, BCPS  07/09/2015 10:44 PM

## 2015-07-09 NOTE — ED Notes (Signed)
Pt transferred from [facility]; Pt c/o of left chest pain 9/10; pt A&O on arrival;

## 2015-07-09 NOTE — ED Provider Notes (Addendum)
CSN: [id number]     Arrival date & time 07/09/15  1600 History   First MD Initiated Contact with Patient 07/09/15 1815     Chief Complaint  Patient presents with  . Chest Pain     (Consider location/radiation/quality/duration/timing/severity/associated sxs/prior Treatment) Patient is a 54 y.o. male presenting with chest pain.  Chest Pain Pain location:  Substernal area Pain quality: sharp   Pain radiates to:  Does not radiate Pain radiates to the back: no   Pain severity:  Moderate Onset quality:  Gradual Duration:  5 hours Timing:  Constant Progression:  Unchanged Chronicity:  New Context: at rest   Relieved by:  Nothing Worsened by:  Coughing and deep breathing Ineffective treatments:  None tried Associated symptoms: no back pain, no cough, no nausea, no numbness, no shortness of breath and not vomiting     Past Medical History  Diagnosis Date  . Anxiety   . Depression   . Post traumatic stress disorder   . Elevated cholesterol    Past Surgical History  Procedure Laterality Date  . Knee surgery     History reviewed. No pertinent family history. Social History  Substance Use Topics  . Smoking status: Current Every Day Smoker  . Smokeless tobacco: None  . Alcohol Use: Yes    Review of Systems  Respiratory: Negative for cough and shortness of breath.   Cardiovascular: Positive for chest pain.  Gastrointestinal: Negative for nausea and vomiting.  Musculoskeletal: Negative for back pain.  Neurological: Negative for numbness.  All other systems reviewed and are negative.     Allergies  Coconut fatty acids  Home Medications   Prior to Admission medications   Medication Sig Start Date End Date Taking? Authorizing Provider  aspirin 81 MG tablet Take 2-3 mg by mouth once.   Yes Historical Provider, MD  Doxylamine Succinate, Sleep, (SLEEP AID PO) Take 1 tablet by mouth at bedtime as needed (sleep).   Yes Historical Provider, MD  ibuprofen (ADVIL,MOTRIN) 200  MG tablet Take 200 mg by mouth every 6 (six) hours as needed for moderate pain.   Yes Historical Provider, MD  Pseudoephedrine HCl (DECONGESTANT PO) Take 1-2 tablets by mouth daily as needed (help breathe).   Yes Historical Provider, MD   BP 164/96 mmHg  Pulse 67  Temp(Src) 97.4 F (36.3 C) (Oral)  Resp 11  SpO2 94% Physical Exam  Constitutional: He is oriented to person, place, and time. He appears well-developed and well-nourished.  HENT:  Head: Normocephalic and atraumatic.  Eyes: Conjunctivae and EOM are normal.  Neck: Normal range of motion. Neck supple.  Cardiovascular: Normal rate, regular rhythm and normal heart sounds.   Pulmonary/Chest: Effort normal and breath sounds normal. No respiratory distress. Chest wall is not dull to percussion. He exhibits tenderness.  Abdominal: He exhibits no distension. There is no tenderness. There is no rebound and no guarding.  Musculoskeletal: Normal range of motion.  Neurological: He is alert and oriented to person, place, and time.  Skin: Skin is warm and dry.  Vitals reviewed.   ED Course  Procedures (including critical care time) Labs Review Labs Reviewed  BASIC METABOLIC PANEL - Abnormal; Notable for the following:    Chloride 100 (*)    Glucose, Bld 122 (*)    All other components within normal limits  TROPONIN I - Abnormal; Notable for the following:    Troponin I 0.10 (*)    All other components within normal limits  I-STAT TROPOININ, ED - Abnormal;  Notable for the following:    Troponin i, poc 0.09 (*)    All other components within normal limits  CBC  D-DIMER, QUANTITATIVE (NOT AT [facility])  APTT  PROTIME-INR  TROPONIN I    Imaging Review Dg Chest 2 View  07/09/2015  CLINICAL DATA:  Left-sided chest pain EXAM: CHEST  2 VIEW COMPARISON:  None. FINDINGS: The mild cardiac silhouette enlargement. Normal vascular pattern. Lungs clear. IMPRESSION: Mild enlargement of cardiac silhouette.  Otherwise negative. Electronically  Signed   By: Esperanza Heir M.D.   On: 07/09/2015 19:13   I have personally reviewed and evaluated these images and lab results as part of my medical decision-making.   EKG Interpretation   Date/Time:  Sunday July 09 2015 19:27:36 EST Ventricular Rate:  74 PR Interval:  137 QRS Duration: 106 QT Interval:  396 QTC Calculation: 439 R Axis:   73 Text Interpretation:  Sinus arrhythmia Consider right atrial enlargement  Inferior infarct, acute Anterior infarct, acute (LAD) code stemi called  Confirmed by Mirian Mo [phone]) on 07/09/2015 7:33:20 PM      CRITICAL CARE Performed by: Mirian Mo   Total critical care time: 35 minutes  Critical care time was exclusive of separately billable procedures and treating other patients.  Critical care was necessary to treat or prevent imminent or life-threatening deterioration.  Critical care was time spent personally by me on the following activities: development of treatment plan with patient and/or surrogate as well as nursing, discussions with consultants, evaluation of patient's response to treatment, examination of patient, obtaining history from patient or surrogate, ordering and performing treatments and interventions, ordering and review of laboratory studies, ordering and review of radiographic studies, pulse oximetry and re-evaluation of patient's condition.   MDM   Final diagnoses:  ST elevation myocardial infarction (STEMI), unspecified artery (HCC)    54 y.o. male with pertinent PMH of anxiety, depression, PTSD, HLD presents with chest pain as above.  On arrival vitals and physical exam as above.  History atypical, exam with reproduction.  Triage labs with +trop.  EKG with STE, no reciprocal changes in inferior leads.  Consulted cardiology.  I have reviewed all laboratory and imaging studies if ordered as above  1. ST elevation myocardial infarction (STEMI), unspecified artery [employer])         Mirian Mo,  MD 07/09/15 [id number]  Cardiology recommended repeat EKG, this was notable for prominent change and STE in anterolateral leads.  Code STEMI called.  Transferred to [facility] emergently after heparin  Mirian Mo, MD 07/09/15 1950

## 2015-07-09 NOTE — ED Notes (Signed)
Pt complaining of left sided burning aching chest pain. Denies SOB, N/V/D, weakness/fatigue, back pain. Pt not obviously diaphoretic. States it feels similar to heartburn or a pulled muscle that hasn't gone away for a couple of hours.

## 2015-07-09 NOTE — ED Notes (Signed)
Bed: WA21 Expected date:  Expected time:  Means of arrival:  Comments: TR 6

## 2015-07-09 NOTE — ED Notes (Signed)
ED tech took EKG to MD A.Freida Busman

## 2015-07-09 NOTE — ED Notes (Signed)
Pt complaining of worsening chest pain in the lobby. Roomed patient in triage while awaiting room. Alerted MD Littie Deeds. Hooked patient up to monitor, ED physician stated did not need to do repeat EKG. Will room patient as soon as room available.

## 2015-07-09 NOTE — Progress Notes (Addendum)
ANTICOAGULATION CONSULT NOTE - Initial Consult  Pharmacy Consult for IV heparin Indication: chest pain/ACS  Allergies  Allergen Reactions  . Coconut Fatty Acids     unknown    Patient Measurements:    Vital Signs: Temp: 97.4 F (36.3 C) (11/27 1604) Temp Source: Oral (11/27 1604) BP: 164/96 mmHg (11/27 1930) Pulse Rate: 67 (11/27 1900)  Labs:  Recent Labs  07/09/15 1725  HGB 16.3  HCT 46.8  PLT 276  CREATININE 0.95  TROPONINI 0.10*    CrCl cannot be calculated (Unknown ideal weight.).   Medical History: Past Medical History  Diagnosis Date  . Anxiety   . Depression   . Post traumatic stress disorder   . Elevated cholesterol     Medications:  Scheduled:  . aspirin      . metoprolol      . morphine       Infusions:    Assessment: 54 yo male presented to ER with CP to start IV heparin per pharmacy for ACS. Baseline labs stable. Note that had to use old weight for patient in order to start heparin quickly as was unable to get weight from ER staff  Goal of Therapy:  Heparin level 0.3-0.7 units/ml Monitor platelets by anticoagulation protocol: Yes   Plan:  1) Note that 5000 units SQ heparin was given x 1 as the bolus  2) IV heparin infusion rate of 1000 units/hr 3) Check heparin level 6 hours after start of IV heparin 4) Daily heparin level and CBC   Hessie Knows, PharmD, BCPS Pager [phone] 07/09/2015 7:51 PM

## 2015-07-10 ENCOUNTER — Encounter (HOSPITAL_COMMUNITY): Payer: Self-pay | Admitting: Cardiovascular Disease

## 2015-07-10 DIAGNOSIS — Z955 Presence of coronary angioplasty implant and graft: Secondary | ICD-10-CM | POA: Diagnosis not present

## 2015-07-10 DIAGNOSIS — Z9861 Coronary angioplasty status: Secondary | ICD-10-CM

## 2015-07-10 DIAGNOSIS — I25119 Atherosclerotic heart disease of native coronary artery with unspecified angina pectoris: Secondary | ICD-10-CM | POA: Diagnosis present

## 2015-07-10 DIAGNOSIS — E785 Hyperlipidemia, unspecified: Secondary | ICD-10-CM | POA: Diagnosis present

## 2015-07-10 DIAGNOSIS — I10 Essential (primary) hypertension: Secondary | ICD-10-CM

## 2015-07-10 DIAGNOSIS — I2510 Atherosclerotic heart disease of native coronary artery without angina pectoris: Secondary | ICD-10-CM | POA: Diagnosis present

## 2015-07-10 LAB — BASIC METABOLIC PANEL
ANION GAP: 10 (ref 5–15)
BUN: 11 mg/dL (ref 6–20)
CO2: 23 mmol/L (ref 22–32)
Calcium: 9.1 mg/dL (ref 8.9–10.3)
Chloride: 101 mmol/L (ref 101–111)
Creatinine, Ser: 0.92 mg/dL (ref 0.61–1.24)
GLUCOSE: 139 mg/dL — AB (ref 65–99)
POTASSIUM: 4.2 mmol/L (ref 3.5–5.1)
Sodium: 134 mmol/L — ABNORMAL LOW (ref 135–145)

## 2015-07-10 LAB — POCT ACTIVATED CLOTTING TIME
ACTIVATED CLOTTING TIME: 700 s
Activated Clotting Time: 116 seconds

## 2015-07-10 LAB — CBC
HEMATOCRIT: 40.3 % (ref 39.0–52.0)
HEMOGLOBIN: 14.3 g/dL (ref 13.0–17.0)
MCH: 30.6 pg (ref 26.0–34.0)
MCHC: 35.5 g/dL (ref 30.0–36.0)
MCV: 86.3 fL (ref 78.0–100.0)
Platelets: 286 10*3/uL (ref 150–400)
RBC: 4.67 MIL/uL (ref 4.22–5.81)
RDW: 13.1 % (ref 11.5–15.5)
WBC: 14.3 10*3/uL — ABNORMAL HIGH (ref 4.0–10.5)

## 2015-07-10 LAB — TROPONIN I: TROPONIN I: 21.93 ng/mL — AB (ref <0.031)

## 2015-07-10 LAB — MRSA PCR SCREENING: MRSA by PCR: NEGATIVE

## 2015-07-10 LAB — HEPARIN LEVEL (UNFRACTIONATED): HEPARIN UNFRACTIONATED: 0.25 [IU]/mL — AB (ref 0.30–0.70)

## 2015-07-10 MED ORDER — ALPRAZOLAM 0.25 MG PO TABS
0.2500 mg | ORAL_TABLET | Freq: Two times a day (BID) | ORAL | Status: DC | PRN
  Administered 2015-07-10 – 2015-07-13 (×5): 0.25 mg via ORAL
  Filled 2015-07-10 (×5): qty 1

## 2015-07-10 MED ORDER — ATROPINE SULFATE 0.1 MG/ML IJ SOLN
INTRAMUSCULAR | Status: AC
  Filled 2015-07-10: qty 10

## 2015-07-10 MED ORDER — HEPARIN (PORCINE) IN NACL 100-0.45 UNIT/ML-% IJ SOLN
1750.0000 [IU]/h | INTRAMUSCULAR | Status: DC
  Administered 2015-07-10: 1350 [IU]/h via INTRAVENOUS
  Administered 2015-07-11: 1550 [IU]/h via INTRAVENOUS
  Filled 2015-07-10 (×2): qty 250

## 2015-07-10 MED ORDER — LISINOPRIL 2.5 MG PO TABS
2.5000 mg | ORAL_TABLET | Freq: Every day | ORAL | Status: DC
  Administered 2015-07-10 – 2015-07-13 (×4): 2.5 mg via ORAL
  Filled 2015-07-10 (×4): qty 1

## 2015-07-10 MED ORDER — METOPROLOL TARTRATE 12.5 MG HALF TABLET
12.5000 mg | ORAL_TABLET | Freq: Two times a day (BID) | ORAL | Status: DC
  Administered 2015-07-10 (×2): 12.5 mg via ORAL
  Filled 2015-07-10 (×2): qty 1

## 2015-07-10 MED ORDER — SERTRALINE HCL 50 MG PO TABS
50.0000 mg | ORAL_TABLET | Freq: Every day | ORAL | Status: DC
  Administered 2015-07-10: 50 mg via ORAL
  Filled 2015-07-10: qty 1

## 2015-07-10 MED ORDER — SODIUM CHLORIDE 0.9 % IV SOLN
INTRAVENOUS | Status: DC
  Administered 2015-07-11: 06:00:00 via INTRAVENOUS

## 2015-07-10 MED ORDER — SERTRALINE HCL 50 MG PO TABS
50.0000 mg | ORAL_TABLET | Freq: Every day | ORAL | Status: DC
  Administered 2015-07-10 – 2015-07-13 (×4): 50 mg via ORAL
  Filled 2015-07-10 (×4): qty 1

## 2015-07-10 MED ORDER — INFLUENZA VAC SPLIT QUAD 0.5 ML IM SUSY
0.5000 mL | PREFILLED_SYRINGE | INTRAMUSCULAR | Status: AC
  Administered 2015-07-12: 0.5 mL via INTRAMUSCULAR
  Filled 2015-07-10: qty 0.5

## 2015-07-10 MED ORDER — TICAGRELOR 90 MG PO TABS
90.0000 mg | ORAL_TABLET | Freq: Two times a day (BID) | ORAL | Status: DC
  Administered 2015-07-10 – 2015-07-13 (×7): 90 mg via ORAL
  Filled 2015-07-10 (×7): qty 1

## 2015-07-10 MED ORDER — ASPIRIN EC 81 MG PO TBEC
81.0000 mg | DELAYED_RELEASE_TABLET | Freq: Every day | ORAL | Status: DC
  Administered 2015-07-10 – 2015-07-13 (×4): 81 mg via ORAL
  Filled 2015-07-10 (×4): qty 1

## 2015-07-10 MED ORDER — SODIUM CHLORIDE 0.9 % IJ SOLN
3.0000 mL | Freq: Two times a day (BID) | INTRAMUSCULAR | Status: DC
  Administered 2015-07-11: 3 mL via INTRAVENOUS

## 2015-07-10 MED ORDER — PNEUMOCOCCAL VAC POLYVALENT 25 MCG/0.5ML IJ INJ
0.5000 mL | INJECTION | INTRAMUSCULAR | Status: AC
  Administered 2015-07-12: 0.5 mL via INTRAMUSCULAR
  Filled 2015-07-10: qty 0.5

## 2015-07-10 MED ORDER — SODIUM CHLORIDE 0.9 % IV SOLN: 250.0000 mL | INTRAVENOUS | Status: DC | PRN

## 2015-07-10 MED ORDER — SODIUM CHLORIDE 0.9 % IJ SOLN: 3.0000 mL | INTRAMUSCULAR | Status: DC | PRN

## 2015-07-10 NOTE — Progress Notes (Signed)
CRITICAL VALUE ALERT  Critical value received:  Trop 21.93  Date of notification:  07/10/15  Time of notification:  1400  Critical value read back yes  Nurse who received alert:  Ellwood Handler  MD notified (1st page):  Lisabeth Devoid  Time of first page:  1400  MD notified (2nd page):  Time of second page:  Responding MD:  Lisabeth Devoid  Time MD responded:  1500

## 2015-07-10 NOTE — Progress Notes (Signed)
CARDIAC REHAB PHASE I  Spoke with pt RN, states pt has been complaining of nausea, pain, received medication and is now sleeping. Per RN, pt has been anxious and will likely not be receptive to education today. Will follow-up tomorrow.    Joylene Grapes, RN, BSN 07/10/2015 1:01 PM

## 2015-07-10 NOTE — Progress Notes (Signed)
Patient unable to void. Bladder scan done with results greater than 999 ml of urine in bladder. Patient to be on bedrest for 4 hours post sheath pull, 16 Fr foley cath placed until bedrest is complete.

## 2015-07-10 NOTE — Progress Notes (Signed)
   07/10/15 1400  Clinical Encounter Type  Visited With Patient not available  Visit Type Initial;Psychological support;Spiritual support;Social support  Referral From Care management  Consult/Referral To Chaplain   Pt. was asleep went chaplain came by. Chaplain will check in later.

## 2015-07-10 NOTE — Progress Notes (Signed)
   07/10/15 0515 07/10/15 0520 07/10/15 0525  Vitals  BP 123/70 mmHg 114/71 mmHg 114/71 mmHg  MAP (mmHg) 85 84 83  Pulse Rate 87 93 96  ECG Heart Rate 84 93 96  Resp 15 20 18   Oxygen Therapy  SpO2 97 % 97 % 96 %     07/10/15 0530 07/10/15 0535 07/10/15 0540  Vitals  BP 112/69 mmHg 115/73 mmHg 114/69 mmHg  MAP (mmHg) 81 84 83  Pulse Rate 95 80 93  ECG Heart Rate 93 80 89  Resp (!) 21 16 16   Oxygen Therapy  SpO2 96 % 98 % 98 %     07/10/15 0545  Vitals  BP 115/78 mmHg  MAP (mmHg) 89  Pulse Rate 96  ECG Heart Rate 94  Resp (!) 22  Oxygen Therapy  SpO2 96 %  Right femoral sheath with oozing prior to sheath pull. Sheath pulled, pressure held with easy hold device for 20 min.,  hemostasis achieved. Bilateral pedal pulses 3+. Post sheath pull, groin site is level 0. Gauze pressure dressing applied. Vital signs stable. Patient tolerated well. Pt educated on holding pressure to site when coughing, sneezing, lifting head, or shifting in bed. Patient demonstrated and voiced understanding. Will continue to monitor.

## 2015-07-10 NOTE — Progress Notes (Signed)
Subjective:  This AM, he reports a "bruising" feeling in his chest though markedly improved from his pain on admission. He reports smoking 1/2 ppd since 1982 and not following with a regular doctor for quite some time. He struggles with anxiety and depression.  Objective:  Vital Signs in the last 24 hours: Temp:  [97.4 F (36.3 C)-97.9 F (36.6 C)] 97.9 F (36.6 C) (11/28 0000) Pulse Rate:  [62-97] 76 (11/28 0700) Resp:  [9-24] 13 (11/28 0700) BP: (110-173)/(64-107) 116/74 mmHg (11/28 0700) SpO2:  [0 %-100 %] 98 % (11/28 0700) Weight:  [208 lb (94.348 kg)-211 lb 10.3 oz (96 kg)] 211 lb 10.3 oz (96 kg) (11/27 2300)  Intake/Output from previous day: 11/27 0701 - 11/28 0700 In: 1581.6 [P.O.:200; I.V.:1381.6] Out: 1101 [Urine:1100; Emesis/NG output:1]  Physical Exam: General: obese, middle aged Caucasian male, resting in bed, 4L O2 by [place] HEENT: EOMI, no scleral icterus, oropharynx clear, no JVD though exam limited by obese neck Cardiac: RRR, no rubs, murmurs or gallops Pulm: clear to auscultation bilaterally in the anterior lung fields, no wheezes, rales, or rhonchi Ext: warm and well perfused, no pedal edema, R groin with bandage clean/dry/intact, Foley in place  Neuro: responds to questions appropriately; moving all extremities freely   Lab Results:  Recent Labs  07/09/15 1725 07/10/15 0449  WBC 9.2 14.3*  HGB 16.3 14.3  PLT 276 286    Recent Labs  07/09/15 1725 07/10/15 0449  NA 135 134*  K 4.1 4.2  CL 100* 101  CO2 25 23  GLUCOSE 122* 139*  BUN 17 11  CREATININE 0.95 0.92    Recent Labs  07/09/15 1942 07/09/15 2250  TROPONINI 0.22* 0.98*    Cardiac Studies: Procedures    Coronary Stent Intervention   Left Heart Cath and Coronary Angiography    Conclusion     Dist LAD lesion, 60% stenosed.  1st Mrg lesion, 80% stenosed.  Prox RCA lesion, 80% stenosed.  Mid LAD lesion, 95% stenosed. Post intervention, there is a 0% residual  stenosis.  1st Diag lesion, 80% stenosed. Post intervention, there is a 0% residual stenosis.  There is mild left ventricular systolic dysfunction.  Mild acute LV dysfunction with mid anterolateral hypocontractility and an ejection fraction of approximate 45-50%.  Significant multivessel CAD with the LAD giving rise to a large bifurcating diagonal vessel with tubular narrowing of 75-80% and after the takeoff of the proximal septal perforating artery. The LAD had a napkin ring 95% stenosis followed by 60% stenosis in its midsegment.  Left circumflex coronary artery with 80% eccentric, somewhat napkin ringlike stenosis in the obtuse marginal 1 vessel.  Large dominant RCA with diffuse 80% proximal tubular stenosis.   Tele: Normal sinus rhythm  Assessment/Plan:  Henry David is a 54 year old male with ongoing tobacco abuse, anxiety, depression and history of hyperlipidemia hospitalized for anterolateral STEMI found to have multi-vessel disease now s/p DES to the mid LAD.   Anteriolateral STEMI s/p DES to mid LAD: Serial EKGs notable for ST elevations in the anteriolateral leads with mildly elevated troponin initially. EKG this AM with Q waves in V4-V6. Cath findings as noted above. Chest discomfort appears improved though confounded by anxiety.  -Continue close monitoring for symptoms with plan for staged PCI either today or tomorrow. -Recheck troponin this morning -Continue nitroglycerin and heparin gtt -Continue ASA, Brillinta -Continue lisinopril 2.5mg  daily -Increase metoprolol to  twice daily -Consult Case Management and Social Work for medication/insurance needs -[employer] operational [employer] for now  with plan to remove once bed rest expires  History of hyperlipidemia: Lipid panel 2008 notable for total cholesterol 174, triglycerides 328, LDL 103, HDL 25. . -Continue Lipitor 80mg  -Check fasting lipid panel tomorrow morning -Consult Case Management and Social Work as noted above  Ongoing  tobacco abuse: Reports smoking 1/2 ppd x 34 years.  -Advise cessation  Anxiety/depression: Reports seeing a therapist previously. -Start Xanax 0.25mg  twice daily as needed for anxiety -Start Zoloft 50mg  as well for mood symptoms    Heywood Iles, M.D. 07/10/2015, 7:51 AM

## 2015-07-10 NOTE — Progress Notes (Signed)
Dr. Herbie Baltimore ordered foley catheter to stay in until after procedure on 07/11/15

## 2015-07-10 NOTE — Progress Notes (Signed)
Pt. Vomited approx. 5 min. After receiving 10am meds --Zofran given.  Pharmacy notified.   Meds given @ 1pm

## 2015-07-10 NOTE — Research (Signed)
Dal-Gene Informed Consent   Subject Name: Henry David  Subject met inclusion and exclusion criteria.  The informed consent form, study requirements and expectations were reviewed with the subject and questions and concerns were addressed prior to the signing of the consent form.  The subject verbalized understanding of the trail requirements.  The subject agreed to participate in the Dal-Gene trial and signed the informed consent.  The informed consent was obtained prior to performance of any protocol-specific procedures for the subject.  A copy of the signed informed consent was given to the subject and a copy was placed in the subject's medical record.  [name] 07/10/2015, 4:50pm

## 2015-07-10 NOTE — Care Management Note (Signed)
Case Management Note  Patient Details  Name: Henry David MRN: [id number] Date of Birth: 07-22-1961  Subjective/Objective:      Adm w stemi              Action/Plan: lives alone, pcp dr plotnikov   Expected Discharge Date:                  Expected Discharge Plan:     In-House Referral:     Discharge planning Services     Post Acute Care Choice:    Choice offered to:     DME Arranged:    DME Agency:     HH Arranged:    HH Agency:     Status of Service:     Medicare Important Message Given:    Date Medicare IM Given:    Medicare IM give by:    Date Additional Medicare IM Given:    Additional Medicare Important Message give by:     If discussed at Long Length of Stay Meetings, dates discussed:    Additional Comments: ur review done  Hanley Hays, RN 07/10/2015, 8:22 AM

## 2015-07-10 NOTE — Progress Notes (Addendum)
Pt states residual CP- but better than earlier. Dr. Herbie Baltimore notified and in room to evaluate pt.EKG ordered

## 2015-07-10 NOTE — Care Management Note (Signed)
Case Management Note  Patient Details  Name: Henry David MRN: [id number] Date of Birth: 06/01/1961  Subjective/Objective:      Adm w mi              Action/Plan: lives alone, pcp dr plotnikov   Expected Discharge Date:                  Expected Discharge Plan:  Home/Self Care  In-House Referral:     Discharge planning Services  CM Consult, Medication Assistance  Post Acute Care Choice:    Choice offered to:     DME Arranged:    DME Agency:     HH Arranged:    HH Agency:     Status of Service:     Medicare Important Message Given:    Date Medicare IM Given:    Medicare IM give by:    Date Additional Medicare IM Given:    Additional Medicare Important Message give by:     If discussed at Long Length of Stay Meetings, dates discussed:    Additional Comments:no ins listed. Left pt assist form on shadow chart if needed. Gave pt 30day free and copay assist card for brilinta.left [facility] list in room also in case does not have ins.  Hanley Hays, RN 07/10/2015, 9:56 AM

## 2015-07-10 NOTE — Progress Notes (Addendum)
ANTICOAGULATION CONSULT NOTE Pharmacy Consult for heparin Indication: CAD s/p PCI  Allergies  Allergen Reactions  . Coconut Fatty Acids     unknown    Patient Measurements: Height: 5\' 10"  (177.8 cm) Weight: 211 lb 10.3 oz (96 kg) IBW/kg (Calculated) : 73  Vital Signs: Temp: 97.5 F (36.4 C) (11/28 2000) Temp Source: Oral (11/28 2000) BP: 132/78 mmHg (11/28 2200) Pulse Rate: 90 (11/28 2200)  Labs:  Recent Labs  07/09/15 1725 07/09/15 1942 07/09/15 2250 07/10/15 0449 07/10/15 1357 07/10/15 2234  HGB 16.3  --   --  14.3  --   --   HCT 46.8  --   --  40.3  --   --   PLT 276  --   --  286  --   --   APTT  --  31  --   --   --   --   LABPROT  --  12.7  --   --   --   --   INR  --  0.93  --   --   --   --   HEPARINUNFRC  --   --   --   --   --  0.25*  CREATININE 0.95  --   --  0.92  --   --   TROPONINI 0.10* 0.22* 0.98*  --  21.93*  --     Estimated Creatinine Clearance: 104.2 mL/min (by C-G formula based on Cr of 0.92).  Assessment: 54 yo male with STEMI s/p PCI, awaiting staged PCI, for heparin.   Goal of Therapy:  Heparin level 0.3-0.7 units/ml Monitor platelets by anticoagulation protocol: Yes   Plan:  Increase Heparin 1550 units/hr Follow-up am labs.   Geannie Risen, PharmD, BCPS  07/10/2015 11:33 PM  Addendum: Repeat level this am 0.11  Infusing OK per RN.  Will increase heparin 1750 units/hr  Check heparin level in 8 hours.  Geannie Risen, PharmD, BCPS 07/11/2015 6:06 AM

## 2015-07-11 ENCOUNTER — Encounter (HOSPITAL_COMMUNITY)
Admission: EM | Disposition: A | Discharge status: Home / Self Care | Payer: Self-pay | Source: Home / Self Care | Attending: Cardiovascular Disease

## 2015-07-11 DIAGNOSIS — I237 Postinfarction angina: Secondary | ICD-10-CM

## 2015-07-11 DIAGNOSIS — F418 Other specified anxiety disorders: Secondary | ICD-10-CM

## 2015-07-11 DIAGNOSIS — I2510 Atherosclerotic heart disease of native coronary artery without angina pectoris: Secondary | ICD-10-CM

## 2015-07-11 HISTORY — PX: CARDIAC CATHETERIZATION: SHX172

## 2015-07-11 LAB — BASIC METABOLIC PANEL
ANION GAP: 10 (ref 5–15)
BUN: 9 mg/dL (ref 6–20)
CALCIUM: 9.1 mg/dL (ref 8.9–10.3)
CO2: 25 mmol/L (ref 22–32)
Chloride: 98 mmol/L — ABNORMAL LOW (ref 101–111)
Creatinine, Ser: 0.95 mg/dL (ref 0.61–1.24)
GFR calc Af Amer: >60 mL/min (ref >60)
Glucose, Bld: 131 mg/dL — ABNORMAL HIGH (ref 65–99)
POTASSIUM: 3.6 mmol/L (ref 3.5–5.1)
SODIUM: 133 mmol/L — AB (ref 135–145)

## 2015-07-11 LAB — HEPATIC FUNCTION PANEL
ALT: 53 U/L (ref 17–63)
AST: 196 U/L — AB (ref 15–41)
Albumin: 3.9 g/dL (ref 3.5–5.0)
Alkaline Phosphatase: 76 U/L (ref 38–126)
Total Bilirubin: 0.9 mg/dL (ref 0.3–1.2)
Total Protein: 7.6 g/dL (ref 6.5–8.1)

## 2015-07-11 LAB — CBC
HCT: 42.5 % (ref 39.0–52.0)
HEMOGLOBIN: 14.7 g/dL (ref 13.0–17.0)
MCH: 30.1 pg (ref 26.0–34.0)
MCHC: 34.6 g/dL (ref 30.0–36.0)
MCV: 87.1 fL (ref 78.0–100.0)
Platelets: 268 10*3/uL (ref 150–400)
RBC: 4.88 MIL/uL (ref 4.22–5.81)
RDW: 13 % (ref 11.5–15.5)
WBC: 18.8 10*3/uL — ABNORMAL HIGH (ref 4.0–10.5)

## 2015-07-11 LAB — TROPONIN I: TROPONIN I: 26.3 ng/mL — AB (ref <0.031)

## 2015-07-11 LAB — HEMOGLOBIN A1C
Hgb A1c MFr Bld: 6.1 % — ABNORMAL HIGH (ref 4.8–5.6)
Mean Plasma Glucose: 128 mg/dL

## 2015-07-11 LAB — LIPID PANEL
CHOL/HDL RATIO: 4.8 ratio
CHOLESTEROL: 206 mg/dL — AB (ref 0–200)
HDL: 43 mg/dL (ref >40)
LDL Cholesterol: 135 mg/dL — ABNORMAL HIGH (ref 0–99)
Triglycerides: 138 mg/dL (ref <150)
VLDL: 28 mg/dL (ref 0–40)

## 2015-07-11 LAB — POCT ACTIVATED CLOTTING TIME: ACTIVATED CLOTTING TIME: 429 s

## 2015-07-11 LAB — HEPARIN LEVEL (UNFRACTIONATED): HEPARIN UNFRACTIONATED: 0.11 [IU]/mL — AB (ref 0.30–0.70)

## 2015-07-11 SURGERY — CORONARY STENT INTERVENTION

## 2015-07-11 MED ORDER — HEPARIN (PORCINE) IN NACL 2-0.9 UNIT/ML-% IJ SOLN
INTRAMUSCULAR | Status: AC
  Filled 2015-07-11: qty 1000

## 2015-07-11 MED ORDER — LIDOCAINE HCL (PF) 1 % IJ SOLN
INTRAMUSCULAR | Status: AC
  Filled 2015-07-11: qty 30

## 2015-07-11 MED ORDER — SODIUM CHLORIDE 0.9 % IV SOLN
250.0000 mL | INTRAVENOUS | Status: DC | PRN
  Administered 2015-07-12: 250 mL via INTRAVENOUS

## 2015-07-11 MED ORDER — FAMOTIDINE 20 MG PO TABS
20.0000 mg | ORAL_TABLET | Freq: Two times a day (BID) | ORAL | Status: DC
  Administered 2015-07-11 – 2015-07-13 (×5): 20 mg via ORAL
  Filled 2015-07-11 (×5): qty 1

## 2015-07-11 MED ORDER — LIDOCAINE HCL (PF) 1 % IJ SOLN
INTRAMUSCULAR | Status: DC | PRN
  Administered 2015-07-11: 15:00:00

## 2015-07-11 MED ORDER — METOPROLOL TARTRATE 25 MG PO TABS
25.0000 mg | ORAL_TABLET | Freq: Two times a day (BID) | ORAL | Status: DC
Start: 2015-07-11 — End: 2015-07-13
  Administered 2015-07-11 – 2015-07-13 (×5): 25 mg via ORAL
  Filled 2015-07-11 (×5): qty 1

## 2015-07-11 MED ORDER — ASPIRIN EC 81 MG PO TBEC: 81.0000 mg | DELAYED_RELEASE_TABLET | Freq: Every day | ORAL | Status: DC

## 2015-07-11 MED ORDER — SODIUM CHLORIDE 0.9 % IV SOLN
INTRAVENOUS | Status: AC
  Administered 2015-07-11: 16:00:00 via INTRAVENOUS

## 2015-07-11 MED ORDER — SODIUM CHLORIDE 0.9 % IJ SOLN
3.0000 mL | Freq: Two times a day (BID) | INTRAMUSCULAR | Status: DC
  Administered 2015-07-11: 3 mL via INTRAVENOUS
  Administered 2015-07-12: 10 mL via INTRAVENOUS
  Administered 2015-07-12: 3 mL via INTRAVENOUS

## 2015-07-11 MED ORDER — MIDAZOLAM HCL 2 MG/2ML IJ SOLN
INTRAMUSCULAR | Status: AC
  Filled 2015-07-11: qty 2

## 2015-07-11 MED ORDER — FENTANYL CITRATE (PF) 100 MCG/2ML IJ SOLN
INTRAMUSCULAR | Status: DC | PRN
  Administered 2015-07-11: 50 ug via INTRAVENOUS

## 2015-07-11 MED ORDER — ACETAMINOPHEN 325 MG PO TABS: 650.0000 mg | ORAL_TABLET | ORAL | Status: DC | PRN

## 2015-07-11 MED ORDER — TICAGRELOR 90 MG PO TABS: 90.0000 mg | ORAL_TABLET | Freq: Two times a day (BID) | ORAL | Status: DC

## 2015-07-11 MED ORDER — FAMOTIDINE 20 MG PO TABS: 20.0000 mg | ORAL_TABLET | Freq: Two times a day (BID) | ORAL | Status: DC

## 2015-07-11 MED ORDER — FENTANYL CITRATE (PF) 100 MCG/2ML IJ SOLN
INTRAMUSCULAR | Status: AC
  Filled 2015-07-11: qty 2

## 2015-07-11 MED ORDER — BIVALIRUDIN 250 MG IV SOLR
INTRAVENOUS | Status: AC
  Filled 2015-07-11: qty 250

## 2015-07-11 MED ORDER — BIVALIRUDIN BOLUS VIA INFUSION - CUPID
INTRAVENOUS | Status: DC | PRN
  Administered 2015-07-11: 70.35 mg via INTRAVENOUS

## 2015-07-11 MED ORDER — ONDANSETRON HCL 4 MG/2ML IJ SOLN: 4.0000 mg | Freq: Four times a day (QID) | INTRAMUSCULAR | Status: DC | PRN

## 2015-07-11 MED ORDER — SODIUM CHLORIDE 0.9 % IJ SOLN: 3.0000 mL | INTRAMUSCULAR | Status: DC | PRN

## 2015-07-11 MED ORDER — NITROGLYCERIN 1 MG/10 ML FOR IR/CATH LAB
INTRA_ARTERIAL | Status: AC
  Filled 2015-07-11: qty 10

## 2015-07-11 MED ORDER — SODIUM CHLORIDE 0.9 % IV SOLN
250.0000 mg | INTRAVENOUS | Status: DC | PRN
  Administered 2015-07-11: 1.75 mg/kg/h via INTRAVENOUS

## 2015-07-11 MED ORDER — NITROGLYCERIN IN D5W 200-5 MCG/ML-% IV SOLN
INTRAVENOUS | Status: AC
  Filled 2015-07-11: qty 250

## 2015-07-11 MED ORDER — MIDAZOLAM HCL 2 MG/2ML IJ SOLN
INTRAMUSCULAR | Status: DC | PRN
  Administered 2015-07-11: 2 mg via INTRAVENOUS

## 2015-07-11 SURGICAL SUPPLY — 15 items
BALLN EMERGE MR 2.5X12 (BALLOONS) ×2
BALLN ~~LOC~~ EUPHORA RX 4.5X15 (BALLOONS) ×2
BALLOON EMERGE MR 2.5X12 (BALLOONS) IMPLANT
BALLOON ~~LOC~~ EUPHORA RX 4.5X15 (BALLOONS) IMPLANT
CATH INFINITI 5FR JL4 (CATHETERS) ×4 IMPLANT
GUIDE CATH RUNWAY 6FR FR4 (CATHETERS) ×2 IMPLANT
KIT ENCORE 26 ADVANTAGE (KITS) ×2 IMPLANT
KIT HEART LEFT (KITS) ×2 IMPLANT
PACK CARDIAC CATHETERIZATION (CUSTOM PROCEDURE TRAY) ×2 IMPLANT
SHEATH PINNACLE 6F 10CM (SHEATH) ×2 IMPLANT
STENT XIENCE ALPINE RX 4.0X18 (Permanent Stent) ×1 IMPLANT
TRANSDUCER W/STOPCOCK (MISCELLANEOUS) ×2 IMPLANT
TUBING CIL FLEX 10 FLL-RA (TUBING) ×2 IMPLANT
WIRE COUGAR XT STRL 190CM (WIRE) ×1 IMPLANT
WIRE EMERALD 3MM-J .035X150CM (WIRE) ×2 IMPLANT

## 2015-07-11 NOTE — Progress Notes (Signed)
CSW consulted for outpatient therapy resources.  CSW met with pt who does report he has struggled with depression for 30 years now- believes this is largely attributed to the decline and death of his parents while he was there caretaker.  Pt is agreeable to resources and CSW provided list of outpatient providers- pt does state he used to take medication for depression and believes this did help while he was on it.  Pt reports no further needs or concerns- CSW signing off.  Domenica Reamer, [place] Social Worker [phone]

## 2015-07-11 NOTE — Progress Notes (Signed)
60f sheath removed from Rt femoral artery at 1700.  Manual pressure applied to rt groin for 20 min.  No complications.  Post sheath removal instructions given.  Pt understands and acknowledges.  Palpable Rt. PT pulse and rt groin level 0  before and after sheath pull.  Vital signs stable.

## 2015-07-11 NOTE — Progress Notes (Signed)
Subjective:  This AM, he appears improved. Still reports some ongoing heartburn though feels his chest pain is improved. No dyspnea.  Objective:  Vital Signs in the last 24 hours: Temp:  [97.5 F (36.4 C)-98.8 F (37.1 C)] 98.6 F (37 C) (11/29 0400) Pulse Rate:  [72-97] 97 (11/29 0600) Resp:  [9-22] 14 (11/29 0600) BP: (115-146)/(46-91) 119/70 mmHg (11/29 0600) SpO2:  [91 %-100 %] 96 % (11/29 0600) Weight:  [206 lb 12.7 oz (93.8 kg)] 206 lb 12.7 oz (93.8 kg) (11/29 0400)  Intake/Output from previous day: 11/28 0701 - 11/29 0700 In: 1208.1 [P.O.:220; I.V.:988.1] Out: 3785 [Urine:3485; Emesis/NG output:300]  Physical Exam: General: obese, middle aged Caucasian male, resting in bed HEENT: EOMI, no scleral icterus, oropharynx clear, no JVD though exam limited by obese neck Cardiac: RRR, no rubs, murmurs or gallops Pulm: clear to auscultation bilaterally in the anterior lung fields, no wheezes, rales, or rhonchi Ext: warm and well perfused, no pedal edema, 2+ DP pulses, R groin with bandage clean/dry/intact, Foley in place  Neuro: responds to questions appropriately; moving all extremities freely   Lab Results:  Recent Labs  07/10/15 0449 07/11/15 0515  WBC 14.3* 18.8*  HGB 14.3 14.7  PLT 286 268    Recent Labs  07/10/15 0449 07/11/15 0515  NA 134* 133*  K 4.2 3.6  CL 101 98*  CO2 23 25  GLUCOSE 139* 131*  BUN 11 9  CREATININE 0.92 0.95    Recent Labs  07/09/15 2250 07/10/15 1357  TROPONINI 0.98* 21.93*    Cardiac Studies: Procedures    Coronary Stent Intervention   Left Heart Cath and Coronary Angiography    Conclusion     Dist LAD lesion, 60% stenosed.  1st Mrg lesion, 80% stenosed.  Prox RCA lesion, 80% stenosed.  Mid LAD lesion, 95% stenosed. Post intervention, there is a 0% residual stenosis.  1st Diag lesion, 80% stenosed. Post intervention, there is a 0% residual stenosis.  There is mild left ventricular systolic  dysfunction.  Mild acute LV dysfunction with mid anterolateral hypocontractility and an ejection fraction of approximate 45-50%.  Significant multivessel CAD with the LAD giving rise to a large bifurcating diagonal vessel with tubular narrowing of 75-80% and after the takeoff of the proximal septal perforating artery. The LAD had a napkin ring 95% stenosis followed by 60% stenosis in its midsegment.  Left circumflex coronary artery with 80% eccentric, somewhat napkin ringlike stenosis in the obtuse marginal 1 vessel.  Large dominant RCA with diffuse 80% proximal tubular stenosis.   Tele: Occasional PVCs  Assessment/Plan:  Henry David is a 54 year old male with ongoing tobacco abuse, anxiety, depression hyperlipidemia, pre-diabetes hospitalized for anterolateral STEMI found to have multi-vessel disease now s/p DES to the mid LAD scheduled for staged PCI today.   Anteriolateral STEMI s/p DES to mid LAD: Serial EKGs notable for ST elevations in the anteriolateral leads and ST changes in the inferior leads with mildly elevated troponin initially. Troponin rose to 21.93 yesterday though repeat EKG yesterday afternoon with no acute findings. -Plan for staged PCI today. -Continue nitroglycerin and heparin gtt -Continue ASA, Brillinta -Continue lisinopril 2.5mg  daily, metoprolol 25mg  twice daily -Consult Case Management and Social Work for medication/insurance needs -Continue Foley for now until post-procedure  Hyperlipidemia: Lipid panel this morning notable for total cholesterol 206, triglycerides 138, LDL 135, HDL 43. . -Continue Lipitor 80mg  -Consult Case Management and Social Work as noted above  Ongoing tobacco abuse: Reports smoking 1/2 ppd x  34 years.  -Advise cessation  Pre-diabetes: A1c 6.1.  -Advise lifestyle modification as noted above  Anxiety/depression: Reports seeing a therapist previously. -Continue Xanax 0.25mg  twice daily as needed for anxiety -Continue Zoloft   as well for mood symptoms  Henry David, PGY2 Internal Medicine Pager: [phone]  07/11/2015, 7:33 AM    I have seen, examined and evaluated the patient this AM along with Dr. Allena Katz on AM rounds.  After reviewing all the available data and chart,  I agree with his findings, examination as well as impression recommendations. Principal Problem:   ST elevation myocardial infarction involving left anterior descending (LAD) coronary artery (HCC) Active Problems:   Coronary artery disease involving native coronary artery with angina pectoris (HCC)   Coronary artery disease involving native coronary artery of native heart with angina pectoris (HCC)   Presence of drug coated stent in LAD coronary artery   Postinfarction angina (HCC)   Essential hypertension   Hyperlipidemia with target LDL less than 70  Admitted with ~ Anterior STEMI - EKG still showing evolving changes Had persistent chest discomfort thoughout the day yesterday - at least some of this sounds MSK (worse with movement).  Significant anxiety - agree with Benzo PRN with Zoloft.  On BB & low dose ACE-I. Statin & DAPT.  With recurrent anginal symptoms following MI & Troponin level not decreasing, agree that proceeding with complete revascularization is indicated.  Plan Cor angio & PCI today Keep foley in until post cath bedrest.  Anticipate transfer to TCU or 6C post PCI. ? D/c tomorrow vs. In 2 days depending upon Sx.    Henry David, M.D., M.S. Interventional Cardiologist   Pager # [phone]

## 2015-07-12 ENCOUNTER — Encounter (HOSPITAL_COMMUNITY): Payer: Self-pay | Admitting: Cardiovascular Disease

## 2015-07-12 DIAGNOSIS — Z955 Presence of coronary angioplasty implant and graft: Secondary | ICD-10-CM

## 2015-07-12 LAB — CBC
HEMATOCRIT: 39.9 % (ref 39.0–52.0)
HEMOGLOBIN: 13.6 g/dL (ref 13.0–17.0)
MCH: 30 pg (ref 26.0–34.0)
MCHC: 34.1 g/dL (ref 30.0–36.0)
MCV: 88.1 fL (ref 78.0–100.0)
Platelets: 252 10*3/uL (ref 150–400)
RBC: 4.53 MIL/uL (ref 4.22–5.81)
RDW: 13 % (ref 11.5–15.5)
WBC: 14.6 10*3/uL — AB (ref 4.0–10.5)

## 2015-07-12 LAB — BASIC METABOLIC PANEL
ANION GAP: 8 (ref 5–15)
BUN: 14 mg/dL (ref 6–20)
CHLORIDE: 101 mmol/L (ref 101–111)
CO2: 23 mmol/L (ref 22–32)
Calcium: 8.4 mg/dL — ABNORMAL LOW (ref 8.9–10.3)
Creatinine, Ser: 0.96 mg/dL (ref 0.61–1.24)
GFR calc Af Amer: >60 mL/min (ref >60)
GLUCOSE: 138 mg/dL — AB (ref 65–99)
POTASSIUM: 3.4 mmol/L — AB (ref 3.5–5.1)
Sodium: 132 mmol/L — ABNORMAL LOW (ref 135–145)

## 2015-07-12 LAB — TROPONIN I: TROPONIN I: 12.46 ng/mL — AB (ref <0.031)

## 2015-07-12 LAB — MAGNESIUM: Magnesium: 2.1 mg/dL (ref 1.7–2.4)

## 2015-07-12 MED ORDER — NICOTINE 7 MG/24HR TD PT24
7.0000 mg | MEDICATED_PATCH | Freq: Every day | TRANSDERMAL | Status: DC
  Filled 2015-07-12: qty 1

## 2015-07-12 MED ORDER — NICOTINE 14 MG/24HR TD PT24
14.0000 mg | MEDICATED_PATCH | Freq: Every day | TRANSDERMAL | Status: DC
  Administered 2015-07-13: 14 mg via TRANSDERMAL
  Filled 2015-07-12: qty 1

## 2015-07-12 MED ORDER — POTASSIUM CHLORIDE ER 10 MEQ PO TBCR: 40.0000 meq | EXTENDED_RELEASE_TABLET | Freq: Once | ORAL | Status: DC

## 2015-07-12 MED ORDER — TRAMADOL HCL 50 MG PO TABS
50.0000 mg | ORAL_TABLET | Freq: Four times a day (QID) | ORAL | Status: DC | PRN
  Administered 2015-07-12 – 2015-07-13 (×2): 50 mg via ORAL
  Filled 2015-07-12 (×2): qty 1

## 2015-07-12 MED ORDER — POTASSIUM CHLORIDE CRYS ER 20 MEQ PO TBCR
40.0000 meq | EXTENDED_RELEASE_TABLET | ORAL | Status: AC
  Administered 2015-07-12 (×2): 40 meq via ORAL
  Filled 2015-07-12 (×2): qty 2

## 2015-07-12 MED ORDER — ISOSORBIDE MONONITRATE ER 30 MG PO TB24
30.0000 mg | ORAL_TABLET | Freq: Every day | ORAL | Status: DC
  Administered 2015-07-12 – 2015-07-13 (×2): 30 mg via ORAL
  Filled 2015-07-12 (×2): qty 1

## 2015-07-12 MED FILL — Nitroglycerin IV Soln 200 MCG/ML in D5W: INTRAVENOUS | Qty: 250 | Status: AC

## 2015-07-12 NOTE — Progress Notes (Signed)
EKG CRITICAL VALUE     12 lead EKG performed.  Critical value noted.  Kavin Leech, RN notified.   Wandalee Ferdinand, [place] 07/12/2015 7:35 AM

## 2015-07-12 NOTE — Progress Notes (Addendum)
Subjective:  This AM, he reports intermittent chest discomfort, mostly left lower side that's worse with inspiration and radiates up the margin of his rib to the sternum and throat. Feels it may be associated with movement with onset prior to procedure yesterday.   Objective:  Vital Signs in the last 24 hours: Temp:  [97.7 F (36.5 C)-98.6 F (37 C)] 98.1 F (36.7 C) (11/30 1343) Pulse Rate:  [0-107] 89 (11/30 1343) Resp:  [0-95] 20 (11/30 1343) BP: (83-134)/(52-90) 83/52 mmHg (11/30 1343) SpO2:  [0 %-100 %] 98 % (11/30 1132)  Intake/Output from previous day: 11/29 0701 - 11/30 0700 In: 2171.5 [P.O.:360; I.V.:1811.5] Out: 3200 [Urine:3200]  Physical Exam: General: obese, middle aged Caucasian male, resting in bed HEENT: EOMI, no scleral icterus, oropharynx clear, no JVD though exam limited by obese neck Cardiac: RRR, no rubs, murmurs or gallops Pulm: clear to auscultation bilaterally, no wheezes, rales, or rhonchi Ext: warm and well perfused, no pedal edema, 2+ DP pulses, R groin with bandage clean/dry/intact  Neuro: responds to questions appropriately; moving all extremities freely   Lab Results:  Recent Labs  07/11/15 0515 07/12/15 0250  WBC 18.8* 14.6*  HGB 14.7 13.6  PLT 268 252    Recent Labs  07/11/15 0515 07/12/15 0250  NA 133* 132*  K 3.6 3.4*  CL 98* 101  CO2 25 23  GLUCOSE 131* 138*  BUN 9 14  CREATININE 0.95 0.96    Recent Labs  07/11/15 0515 07/12/15 1011  TROPONINI 26.30* 12.46*    Cardiac Studies:  07/09/15 Procedures    Coronary Stent Intervention   Left Heart Cath and Coronary Angiography    Conclusion     Dist LAD lesion, 60% stenosed.  1st Mrg lesion, 80% stenosed.  Prox RCA lesion, 80% stenosed.  Mid LAD lesion, 95% stenosed. Post intervention, there is a 0% residual stenosis.  1st Diag lesion, 80% stenosed. Post intervention, there is a 0% residual stenosis.  There is mild left ventricular systolic  dysfunction.  Mild acute LV dysfunction with mid anterolateral hypocontractility and an ejection fraction of approximate 45-50%.  Significant multivessel CAD with the LAD giving rise to a large bifurcating diagonal vessel with tubular narrowing of 75-80% and after the takeoff of the proximal septal perforating artery. The LAD had a napkin ring 95% stenosis followed by 60% stenosis in its midsegment.  Left circumflex coronary artery with 80% eccentric, somewhat napkin ringlike stenosis in the obtuse marginal 1 vessel.  Large dominant RCA with diffuse 80% proximal tubular stenosis.   07/11/15 Procedures    Coronary Stent Intervention    Conclusion     1st Mrg lesion, 30% stenosed.  1st Diag lesion, 25% stenosed.  Prox RCA to Mid RCA lesion, 90% stenosed. Post intervention, there is a 0% residual stenosis.  Widely patent stent in the LAD at the site of acute intervention on 07/09/2015 with no significant restenosis at the site of PTCA in the first diagonal vessel.  Left circumflex coronary artery with significant improvement in the circumflex flex marginal stenosis, which now appeared only 30%.  Large dominant RCA with 90% stenosis proximally.  Successful PCI/DES stenting of the RCA with insertion of a 4.018 mm Xience Alpine DES stent postdilated to 4.51 mm with the stenosis being reduced to 0% and evidence for brisk TIMI-3 flow without evidence for dissection.      Tele: Occasional PVCs  Assessment/Plan:  [name] is a 54 year old male with ongoing tobacco abuse, anxiety, depression, hyperlipidemia,  pre-diabetes hospitalized for anterolateral STEMI found to have multi-vessel disease now s/p DES to the mid LAD and proximal RCA.   Anteriolateral STEMI s/p DES to mid LAD and proximal RCA: Repeat EKG this AM with ST changes in V2, V3 and Q waves in the inferior leads which appear stable from post-cath EKG. Troponin 26.3 yesterday . -Wean off nitroglycerin and encourage  ambulation today -Start Imdur 30mg  -Continue ASA, Brillinta -Continue lisinopril 2.5mg  daily, metoprolol 25mg  twice daily -Transfer to telemetry today  Hyperlipidemia: Lipid panel notable for total cholesterol 206, triglycerides 138, LDL 135, HDL 43. . -Continue Lipitor 80mg   Ongoing tobacco abuse: Reports smoking 1/2 ppd x 34 years.  -Advise cessation -Start 7g nicotine patch  Pre-diabetes: A1c 6.1.  -Advise lifestyle modification as noted above  Anxiety/depression: Reports seeing a therapist previously. Received resources yesterday from social work. -Continue Xanax 0.25mg  twice daily as needed for anxiety -Continue Zoloft 50mg  as well for mood symptoms  Heywood Iles, PGY2 Internal Medicine Pager: [phone]  07/12/2015, 7:10 AM    have seen, examined and evaluated the patient this AM along with Dr. Allena Katz on AM rounds. After reviewing all the available data and chart, I agree with his findings, examination as well as impression recommendations.  Principal Problem:   ST elevation myocardial infarction involving left anterior descending (LAD) coronary artery [facility]) Active Problems:   Coronary artery disease involving native coronary artery with angina pectoris (HCC)   Coronary artery disease involving native coronary artery of native heart with angina pectoris (HCC)   Presence of drug coated stent in LAD coronary artery   Postinfarction angina (HCC)   Presence of drug coated stent in right coronary artery   Essential hypertension   Hyperlipidemia with target LDL less than 70   CAD in native artery  As post PCI of the RCA for post infarct angina residual RCA stenosis. Thankfully the circumflex lesion apparently cleared up with no significant stenosis by relook cath. He may have had a small thrombus could not location, but the lesion is now no longer significant. I personally reviewed the images with Dr. Tresa Endo yesterday. He still has some musculoskeletal chest pain on this  side. He still feels somewhat weak. Is yet to walk around. I would like to have him ambulate with cardiac rehabilitation who will help hopefully with motivation and lifestyle modification recommendations. We talked for about 7 right minutes about smoking cessation. We can consider NicoDerm patch if he has cravings, but not really having much cravings. His blood pressure has been stable, but am reluctant to titrate up any further from the current dose of beta blocker and ACE inhibitor. We have started Imdur. On dual platelet therapy and statin.  Remains very anxious and nervous. He definitely will benefit from Zoloft as an outpatient. This will also help with smoking cessation.   -- He did not like get up with cardiac rehabilitation today, but did walk with them and then had some discomfort in his chest after going to the bathroom. In quite rapidly became very upset because felt like he was being pushed around and he was trying to get into bed to take rest but is light was on, he then felt stressed about being moved to another unit which is already the plan to transfer to telemetry today. He did not remember that discussion. He was frustrated because he felt rushed with finishing his lunch prior to transfer to the telemetry floor. He rapidly went from feeling poorly lying in bed to wearing  his sitting close monitoring to leave potentially gets medical advice. He came down to talk with him about this and long 20 minute discussion about the importance of not making rash decisions. I felt like he was not feeling well and wanted to monitor him. We'll give him some Ultram for his but clearly muscle skeletal chest pain. But my concern is with his 2 new stents in him being relatively weak at 100 monitor him another day to ensure that his blood pressure remained stable and that he is no longer having any anginal symptoms.  I was able to convince him to stay for the day to allow [place] to monitor stability. We will  plan to discharge tomorrow administered is any adverse outcomes. He will need some medication assistance, at least for the Brilinta. Follow-up for medical form for assistance.  He finally calmed down and agreed to move to the telemetry floor if he can eat his lunch there. We'll have him ambulate in the hallways see how he does. We will provide tramadol for her chest discomfort as well to minimize sedation already getting Xanax for anxiety. Stressed the importance of actually being forthcoming with his concerns and feelings of his treatment prior to that and it get to the point of him being so upset that he mostly than me. In the C2 comfortable discussing his concerns but also understand that there are protocols that need to be followed.      Marykay Lex, M.D., M.S. Interventional Cardiologist   Pager # [phone]

## 2015-07-12 NOTE — Progress Notes (Signed)
CARDIAC REHAB PHASE I   PRE:  Rate/Rhythm: 93 SR  BP:  Sitting: 120/70        SaO2: 95 RA  MODE:  Ambulation: 350 ft   POST:  Rate/Rhythm: 111 ST  BP:  Sitting: 116/70         SaO2: 98 RA  Pt ambulated 350 ft on RA, handheld assist, fairly steady gait, tolerated well.  Pt c/o of mild DOE, possible dizziness (pt very vague explaining symptoms), denies cp, does complain of ongoing pain to left chest/rib area with deep breaths, declined rest stop. Began MI/stent education.  Reviewed risk factors, tobacco cessation (gave pt fake cigarette), anti-platelet therapy, stent card, activity restrictions, ntg, exercise, and phase 2 cardiac rehab. Gave pt heart healthy diet handout to review, will discuss tomorrow as pt states he is overwhelmed. Pt verbalized understanding, receptive to education. Pt states he has been very depressed, does not work and does not have insurance. Will need to discuss cost of medication with case manager prior to discharge. Pt is currently on brilinta. Pt agrees to phase 2 cardiac rehab referral, will send to [facility]. Gave pt financial assistance form. Pt to recliner after walk, call bell within reach. Encouraged additional ambulation today. Will follow.   [id number]  Joylene Grapes, RN, BSN 07/12/2015 10:54 AM

## 2015-07-13 DIAGNOSIS — F411 Generalized anxiety disorder: Secondary | ICD-10-CM

## 2015-07-13 DIAGNOSIS — R7303 Prediabetes: Secondary | ICD-10-CM

## 2015-07-13 DIAGNOSIS — Z720 Tobacco use: Secondary | ICD-10-CM

## 2015-07-13 LAB — BASIC METABOLIC PANEL
Anion gap: 8 (ref 5–15)
BUN: 15 mg/dL (ref 6–20)
CALCIUM: 8.3 mg/dL — AB (ref 8.9–10.3)
CO2: 22 mmol/L (ref 22–32)
CREATININE: 0.94 mg/dL (ref 0.61–1.24)
Chloride: 103 mmol/L (ref 101–111)
GFR calc Af Amer: >60 mL/min (ref >60)
GLUCOSE: 115 mg/dL — AB (ref 65–99)
Potassium: 3.8 mmol/L (ref 3.5–5.1)
Sodium: 133 mmol/L — ABNORMAL LOW (ref 135–145)

## 2015-07-13 LAB — CBC
HCT: 37.8 % — ABNORMAL LOW (ref 39.0–52.0)
Hemoglobin: 13 g/dL (ref 13.0–17.0)
MCH: 30.4 pg (ref 26.0–34.0)
MCHC: 34.4 g/dL (ref 30.0–36.0)
MCV: 88.3 fL (ref 78.0–100.0)
Platelets: 249 10*3/uL (ref 150–400)
RBC: 4.28 MIL/uL (ref 4.22–5.81)
RDW: 13 % (ref 11.5–15.5)
WBC: 13.1 10*3/uL — ABNORMAL HIGH (ref 4.0–10.5)

## 2015-07-13 MED ORDER — SERTRALINE HCL 50 MG PO TABS: 50.0000 mg | ORAL_TABLET | Freq: Every day | ORAL | Status: DC

## 2015-07-13 MED ORDER — TICAGRELOR 90 MG PO TABS: 90.0000 mg | ORAL_TABLET | Freq: Two times a day (BID) | ORAL | Status: DC

## 2015-07-13 MED ORDER — LISINOPRIL 2.5 MG PO TABS: 2.5000 mg | ORAL_TABLET | Freq: Every day | ORAL | Status: DC

## 2015-07-13 MED ORDER — FAMOTIDINE 20 MG PO TABS: 20.0000 mg | ORAL_TABLET | Freq: Two times a day (BID) | ORAL | Status: DC

## 2015-07-13 MED ORDER — ASPIRIN 81 MG PO TBEC: 81.0000 mg | DELAYED_RELEASE_TABLET | Freq: Every day | ORAL | Status: DC

## 2015-07-13 MED ORDER — METOPROLOL TARTRATE 25 MG PO TABS: 25.0000 mg | ORAL_TABLET | Freq: Two times a day (BID) | ORAL | Status: DC

## 2015-07-13 MED ORDER — ISOSORBIDE MONONITRATE ER 30 MG PO TB24: 30.0000 mg | ORAL_TABLET | Freq: Every day | ORAL | Status: DC

## 2015-07-13 MED ORDER — ATORVASTATIN CALCIUM 80 MG PO TABS: 80.0000 mg | ORAL_TABLET | Freq: Every day | ORAL | Status: DC

## 2015-07-13 MED ORDER — MAGNESIUM HYDROXIDE 400 MG/5ML PO SUSP
30.0000 mL | Freq: Every day | ORAL | Status: DC | PRN
  Administered 2015-07-13: 30 mL via ORAL
  Filled 2015-07-13: qty 30

## 2015-07-13 MED ORDER — NICOTINE 14 MG/24HR TD PT24: 14.0000 mg | MEDICATED_PATCH | Freq: Every day | TRANSDERMAL | Status: DC

## 2015-07-13 NOTE — Discharge Summary (Signed)
Physician Discharge Summary   Cardiologist:  Cleda Clarks PCP: Sonda Primes, MD  Patient ID: Henry David MRN: [id number] DOB/AGE: [birth year] 54 y.o.  Admit date: 07/09/2015 Discharge date: 07/13/2015  Admission Diagnoses:  STEMI  Discharge Diagnoses:  Principal Problem:   ST elevation myocardial infarction involving left anterior descending (LAD) coronary artery (HCC) Active Problems:   Anxiety state   Essential hypertension   Hyperlipidemia with target LDL less than 70   Coronary artery disease involving native coronary artery with angina pectoris (HCC)   Presence of drug coated stent in LAD coronary artery   Postinfarction angina (HCC)   Presence of drug coated stent in right coronary artery   Prediabetes    Discharged Condition: stable  Hospital Course:   [name] denies any known prior cardiac history. He has a history of anxiety and depression and stress disorder. He has not seen a physician in years. In the past he was told of having high cholesterol but has  never received treatment. Reactive tobacco use. Between noon and 1 PM, the day of admission he developed chest discomfort. He initially presented to [facility] ER at ~ 4 PM and it was felt that his chest pain was somewhat atypical and he was not taken back to be evaluated immediately. His initial ECG did not show any definitive diagnostic changes, but there was a suggestion of subtle inferior J-point elevation. His point of care marker came back minimally positive. Several hours later. Subsequent ECG was done with troponin level and his ECG showed ST elevation anterolaterally code STEMI was called. He was transported emergently to [facility] catheterization laboratory for emergent cardiac catheterization.  The cardiac cath revealed the dist LAD lesion, 60% stenosed. 1st Mrg lesion, 80% stenosed. Prox RCA lesion, 80% stenosed.  Mid LAD lesion, 95% stenosed.  1st Diag lesion, 80% stenosed. There is mild left  ventricular systolic dysfunction.  Mild acute LV dysfunction with mid anterolateral hypocontractility and an ejection fraction of approximate 45-50%.  He underwent successful percutaneous coronary intervention with PTCA/stenting of the mid LAD with insertion of a 2.7518 mm Xience Alpine stent dilated to 2.97 mm with the stenoses being reduced to 0%, and PTCA of the very proximal diagonal 1 vessel with the 75-80% tubular stenosis being reduced to 0-5%.  He was started on ASA, brilinta, high dose statin, beta blocker. The plan was for resumption of heparin 8 hours post sheath removal with plans for staged PCI to the RCA and probable OM1 vessel in 2-3 days.  He also continued on IV NTG.  The stage PCI took place on 11/29 with successful PCI/DES stenting of the RCA with insertion of a 4.018 mm Xience Alpine DES stent.  The patient had significant anxiety and was started on Zolft and Xanax.  Case manger and CSW consulted for medication assistance.  He was enrolled in the Dal-Gene trial.  Tobacco cessation was discussed as well as appropriate diet.  Nicotine patch, imdur and Lisinopril 2.5 added.  On 11/30 he ambulated well with Cardiac Rehab.  No complaints of CP.  He agrees to participate in Phase II CR.   The patient was seen by Dr. Herbie Baltimore who felt he was stable for DC home.    He will need lipid panel and LFTs in 6 weeks.      Consults: Cardiac rehab, Case manager, Social work  Significant Diagnostic Studies:   Conclusion     Dist LAD lesion, 60% stenosed.  1st Mrg lesion, 80% stenosed.  Prox RCA  lesion, 80% stenosed.  Mid LAD lesion, 95% stenosed. Post intervention, there is a 0% residual stenosis.  1st Diag lesion, 80% stenosed. Post intervention, there is a 0% residual stenosis.  There is mild left ventricular systolic dysfunction.  Mild acute LV dysfunction with mid anterolateral hypocontractility and an ejection fraction of approximate 45-50%.  Significant multivessel CAD with the  LAD giving rise to a large bifurcating diagonal vessel with tubular narrowing of 75-80% and after the takeoff of the proximal septal perforating artery. The LAD had a napkin ring 95% stenosis followed by 60% stenosis in its midsegment.  Left circumflex coronary artery with 80% eccentric, somewhat napkin ringlike stenosis in the obtuse marginal 1 vessel.  Large dominant RCA with diffuse 80% proximal tubular stenosis.  Successful percutaneous coronary intervention with PTCA/stenting of the mid LAD with insertion of a 2.7518 mm Xience Alpine stent dilated to 2.97 mm with the stenoses being reduced to 0%, and PTCA of the very proximal diagonal 1 vessel with the 75-80% tubular stenosis being reduced to 0-5%.  RECOMMENDATION: The patient will be maintained on bivalirudin for 4 hours post procedure. The patient had delayed presentation to the Cath Lab. due to initial uncertainty of cardiac etiology to his discomfort after presenting to [facility].Marland Kitchen He ultimately evolved anterolateral ST elevation and underwent intervention to both the mid LAD and proximal diagonal vessel. Plan for resumption of heparin 8 hours post sheath removal with plans for staged PCI to the RCA and probable OM1 vessel in 2-3 days.     Diagnostic Diagram 07/09/15        Post-Intervention Diagram 07/11/15         Treatments: See above  Discharge Exam: Blood pressure 115/66, pulse 87, temperature 98.2 F (36.8 C), temperature source Oral, resp. rate 20, height  (1.778 m), weight 206 lb 12.7 oz (93.8 kg), SpO2 99 %.   Disposition: 01-Home or Self Care      Discharge Instructions    AMB Referral to Cardiac Rehabilitation - Phase II    Complete by:  As directed   Diagnosis:  Myocardial Infarction     Diet - low sodium heart healthy    Complete by:  As directed      Increase activity slowly    Complete by:  As directed             Medication List    STOP taking these medications        aspirin 81 MG  tablet  Replaced by:  aspirin 81 MG EC tablet     DECONGESTANT PO     ibuprofen 200 MG tablet  Commonly known as:  ADVIL,MOTRIN      TAKE these medications        aspirin 81 MG EC tablet  Take 1 tablet (81 mg total) by mouth daily.     atorvastatin 80 MG tablet  Commonly known as:  LIPITOR  Take 1 tablet (80 mg total) by mouth daily at 6 PM.     famotidine 20 MG tablet  Commonly known as:  PEPCID  Take 1 tablet (20 mg total) by mouth 2 (two) times daily.     isosorbide mononitrate 30 MG 24 hr tablet  Commonly known as:  IMDUR  Take 1 tablet (30 mg total) by mouth daily.     lisinopril 2.5 MG tablet  Commonly known as:  PRINIVIL,ZESTRIL  Take 1 tablet (2.5 mg total) by mouth daily.     metoprolol tartrate 25 MG tablet  Commonly known as:  LOPRESSOR  Take 1 tablet (25 mg total) by mouth 2 (two) times daily.     nicotine 14 mg/24hr patch  Commonly known as:  NICODERM CQ - dosed in mg/24 hours  Place 1 patch (14 mg total) onto the skin daily.     sertraline 50 MG tablet  Commonly known as:  ZOLOFT  Take 1 tablet (50 mg total) by mouth daily.     SLEEP AID PO  Take 1 tablet by mouth at bedtime as needed (sleep).     ticagrelor 90 MG Tabs tablet  Commonly known as:  BRILINTA  Take 1 tablet (90 mg total) by mouth 2 (two) times daily.       Follow-up Information    Follow up with Abelino Derrick, PA-C On 07/20/2015.   Specialties:  Cardiology, Radiology   Why:  9:00 AM   Contact information:   [address] [place] [address] [phone]       Schedule an appointment as soon as possible for a visit with Sonda Primes, MD.   Specialty:  Internal Medicine   Contact information:   [address] [place] [address] [phone]      Greater than 30 minutes was spent completing the patient's discharge.   SignedWilburt Finlay, PAC 07/13/2015, 10:23 AM   I have seen, examined and evaluated the patient this AM along with Mr. Leron Croak, [place].  After reviewing all the available data and chart, I agree with his summary.  Patient actually is doing much better today. He had some mild reproducible left-sided chest discomfort that I can palpate on exam. He is still anxious, but he is stable for discharge today. Blood pressure stable. On stable regimen.  He is on low-dose ACE inhibitor and beta blocker. On dual antiplatelet therapy and high-dose statin.  Smoking cessation counseling provided. He may require patches at home. We discussed that. Currently on patch here.  For his muscle skeletal chest pain, would use Ultram when necessary. Needs prediabetes evaluation by PCP.  With his anxiety, I would prefer to use a standing dose of Zoloft as opposed to when necessary benzodiazepine. I also plan to him the importance of reestablishing with his PCP to get this managed. Apparently was undergoing psychotherapy, but stopped it because he felt it wasn't helping.  Plan: Discharge today with close follow-up next week.   Marykay Lex, M.D., M.S. Interventional Cardiologist   Pager # [phone]

## 2015-07-13 NOTE — Progress Notes (Signed)
CARDIAC REHAB PHASE I   Pt lying in bed, eyes closed, states he is constipated and cannot move right now. Pt declines ambulation at this time. Pt increasingly frustrated, states he does no wish to discuss diet education, states he "will not remember it anyway."  When asked if pt will be willing to discuss diet education or ambulate later today, pt states "I don't know." RN notified, will attempt to follow-up as schedule permits and if pt is willing/receptive.    Joylene Grapes, RN, BSN 07/13/2015 9:17 AM

## 2015-07-13 NOTE — Progress Notes (Signed)
Discussed with the patient and all questioned fully answered. He will call me if any problems arise. Given paper prescriptions including 30 day Brilinta prescription. Reinforced that he go to wellness clinic to fill prescriptions.   Leonidas Romberg, RN

## 2015-07-13 NOTE — Hospital Discharge Follow-Up (Signed)
This Case Manager received phone call from Donn Pierini, RN CM who indicated patient needing a hospital follow-up appointment.  Patient does not have a PCP.  Appointment scheduled on 07/18/15 at 1545 with Dr. Venetia Night.  AVS updated.  Donn Pierini, RN CM also updated.

## 2015-07-13 NOTE — Progress Notes (Signed)
Subjective: Still with some reproducible CP.    Objective: Vital signs in last 24 hours: Temp:  [98.1 F (36.7 C)-98.5 F (36.9 C)] 98.2 F (36.8 C) (12/01 0313) Pulse Rate:  [82-98] 87 (12/01 0313) Resp:  [20] 20 (12/01 0313) BP: (83-116)/(52-70) 115/66 mmHg (12/01 0313) SpO2:  [96 %-99 %] 99 % (12/01 0313) Last BM Date: 07/09/15  Intake/Output from previous day: 11/30 0701 - 12/01 0700 In: 751.5 [P.O.:720; I.V.:31.5] Out: 450 [Urine:450] Intake/Output this shift:    Medications Scheduled Meds: . aspirin EC  81 mg Oral Daily  . atorvastatin  80 mg Oral q1800  . famotidine  20 mg Oral BID  . isosorbide mononitrate  30 mg Oral Daily  . lisinopril  2.5 mg Oral Daily  . metoprolol tartrate  25 mg Oral BID  . nicotine  14 mg Transdermal Daily  . sertraline  50 mg Oral Daily  . sodium chloride  3 mL Intravenous Q12H  . ticagrelor  90 mg Oral BID   Continuous Infusions:  PRN Meds:.sodium chloride, acetaminophen, ALPRAZolam, morphine injection, ondansetron (ZOFRAN) IV, traMADol  PE: General appearance: alert, cooperative and no distress Lungs: clear to auscultation bilaterally Heart: regular rate and rhythm, S1, S2 normal, no murmur, click, rub or gallop Extremities: No LEE Pulses: 2+ and symmetric Skin: Warm and dry Neurologic: Grossly normal  Lab Results:   Recent Labs  07/11/15 0515 07/12/15 0250 07/13/15 0305  WBC 18.8* 14.6* 13.1*  HGB 14.7 13.6 13.0  HCT 42.5 39.9 37.8*  PLT 268 252 249   BMET  Recent Labs  07/11/15 0515 07/12/15 0250 07/13/15 0305  NA 133* 132* 133*  K 3.6 3.4* 3.8  CL 98* 101 103  CO2 25 23 22   GLUCOSE 131* 138* 115*  BUN 9 14 15   CREATININE 0.95 0.96 0.94  CALCIUM 9.1 8.4* 8.3*   PT/INR No results for input(s): LABPROT, INR in the last 72 hours. Cholesterol  Recent Labs  07/11/15 0515  CHOL 206*   Lipid Panel     Component Value Date/Time   CHOL 206* 07/11/2015 0515   TRIG 138 07/11/2015 0515   HDL 43  07/11/2015 0515   CHOLHDL 4.8 07/11/2015 0515   VLDL 28 07/11/2015 0515   LDLCALC 135* 07/11/2015 0515   LDLDIRECT 103.1 05/22/2007 8372       Assessment/Plan  Principal Problem:   ST elevation myocardial infarction involving left anterior descending (LAD) coronary artery (HCC) Active Problems:   Essential hypertension   Hyperlipidemia with target LDL less than 70   Coronary artery disease involving native coronary artery with angina pectoris (HCC)   Coronary artery disease involving native coronary artery of native heart with angina pectoris (HCC)   Presence of drug coated stent in LAD coronary artery   Postinfarction angina (HCC)   CAD in native artery   Presence of drug coated stent in right coronary artery  [name] is a 54 year old male with ongoing tobacco abuse, anxiety, depression, hyperlipidemia, pre-diabetes hospitalized for anterolateral STEMI found to have multi-vessel disease now s/p DES to the mid LAD and proximal RCA.   Anteriolateral STEMI s/p DES to mid LAD and proximal RCA 07/09/15:  . -Imdur 30mg , ASA, Brillinta -Continue lisinopril 2.5mg  daily, metoprolol 25mg  twice daily BP controlled.   Hyperlipidemia:  Lipid panel notable for total cholesterol 206, triglycerides 138, LDL 135, HDL 43. . -Continue Lipitor 80mg  LFTs and lipids in six weeks.   Ongoing tobacco abuse:  Reports smoking 1/2 ppd x  34 years. Advise cessation.  7g nicotine patch  Pre-diabetes:  A1c 6.1. Diet and exercise discussed.   Anxiety/depression:  Reports seeing a therapist previously. Received resources yesterday from social work. Continue Zoloft  for mood symptoms.  Appears nervous about going home.  Lives alone and no family.  Ambulate with Cardiac rehab this morning and likely DC home    LOS: 4 days    HAGER, BRYAN PA-C 07/13/2015 7:46 AM  I have seen, examined and evaluated the patient this AM along with Mr. Leron Croak, [place].  After reviewing all the available data  and chart,  I agree with his findings, examination as well as impression recommendations.  Patient actually is doing much better today. He had some mild reproducible left-sided chest discomfort that I can palpate on exam. He is still anxious, but he is stable for discharge today. Blood pressure stable. On stable regimen.  He is on low-dose ACE inhibitor and beta blocker. On dual antiplatelet therapy and high-dose statin.  Smoking cessation counseling provided. He may require patches at home. We discussed that. Currently on patch here.  For his muscle skeletal chest pain, would use Ultram when necessary. Needs prediabetes evaluation by PCP.  With his anxiety, I would prefer to use a standing dose of Zoloft as opposed to when necessary benzodiazepine. I also plan to him the importance of reestablishing with his PCP to get this managed. Apparently was undergoing psychotherapy, but stopped it because he felt it wasn't helping.  Plan: Discharge today with close follow-up next week.   Marykay Lex, M.D., M.S. Interventional Cardiologist   Pager # [phone]

## 2015-07-13 NOTE — Care Management Note (Signed)
Case Management Note Previous CM note initiated by Dorcas Carrow RN CM  Patient Details  Name: Henry David MRN: [id number] Date of Birth: Mar 01, 1961  Subjective/Objective:      Adm w mi              Action/Plan: lives alone, pcp dr Posey Rea   Expected Discharge Date:    07/13/15              Expected Discharge Plan:  Home/Self Care  In-House Referral:     Discharge planning Services  CM Consult, Medication Assistance, [facility] Clinic, Follow-up appt scheduled  Post Acute Care Choice:  NA Choice offered to:  NA  DME Arranged:  N/A DME Agency:  NA  HH Arranged:  NA HH Agency:  NA  Status of Service:  Completed, signed off  Medicare Important Message Given:    Date Medicare IM Given:    Medicare IM give by:    Date Additional Medicare IM Given:    Additional Medicare Important Message give by:     If discussed at Long Length of Stay Meetings, dates discussed:    Additional Comments:no ins listed. Left pt assist form on shadow chart if needed. Gave pt 30day free and copay assist card for brilinta.left [facility] list in room also in case does not have ins.--- Dorcas Carrow RN CM   07/13/15- Donn Pierini RN, BSN - 1200- pt for discharge today- in to speak with pt at bedside regarding d/c needs- per pt he states that he does not have a PCP- has not seen one in "years", pt also reports that he has no income/ does not work- is worried about loosing his house after recently loosing his mother. Pt appears overwhelmed by situation- discussed Brilinta and importance of taking drug- pt has 30 day free card- assisted pt with filling out pt assistance form, MD has signed - form faxed for pt to pt assistance with Brilinta (original application returned pt)- pt will also be assisted with medications through [facility] at discharge- program explained to pt and letter given- pt states that he can do the $3 per script copay. After discussing PCP needs- pt agreeable to establish  at [facility]- call made to [facility] [place] liaison with clinic - appointment made for 07/18/15 at 1545 pm- pt given info on clinic along with appointment time.    Darrold Span, RN 07/13/2015, 12:12 PM

## 2015-07-18 ENCOUNTER — Telehealth: Payer: Self-pay | Admitting: *Deleted

## 2015-07-18 ENCOUNTER — Inpatient Hospital Stay: Payer: Self-pay | Admitting: Family Medicine

## 2015-07-18 NOTE — Telephone Encounter (Signed)
I spoke to patient on phone to let him know he did not have the genotype to qualify for the Dal-Gene Study. Patient verbalized understanding.

## 2015-07-20 ENCOUNTER — Ambulatory Visit (INDEPENDENT_AMBULATORY_CARE_PROVIDER_SITE_OTHER): Payer: Self-pay | Admitting: Cardiology

## 2015-07-20 ENCOUNTER — Encounter: Payer: Self-pay | Admitting: Cardiology

## 2015-07-20 VITALS — BP 96/66 | HR 68 | Ht 71.0 in | Wt 210.5 lb

## 2015-07-20 DIAGNOSIS — F411 Generalized anxiety disorder: Secondary | ICD-10-CM

## 2015-07-20 DIAGNOSIS — F17200 Nicotine dependence, unspecified, uncomplicated: Secondary | ICD-10-CM | POA: Insufficient documentation

## 2015-07-20 DIAGNOSIS — Z79899 Other long term (current) drug therapy: Secondary | ICD-10-CM

## 2015-07-20 DIAGNOSIS — Z9861 Coronary angioplasty status: Secondary | ICD-10-CM

## 2015-07-20 DIAGNOSIS — I2510 Atherosclerotic heart disease of native coronary artery without angina pectoris: Secondary | ICD-10-CM

## 2015-07-20 DIAGNOSIS — E785 Hyperlipidemia, unspecified: Secondary | ICD-10-CM

## 2015-07-20 DIAGNOSIS — I255 Ischemic cardiomyopathy: Secondary | ICD-10-CM | POA: Insufficient documentation

## 2015-07-20 DIAGNOSIS — R7303 Prediabetes: Secondary | ICD-10-CM

## 2015-07-20 DIAGNOSIS — Z720 Tobacco use: Secondary | ICD-10-CM

## 2015-07-20 DIAGNOSIS — I2102 ST elevation (STEMI) myocardial infarction involving left anterior descending coronary artery: Secondary | ICD-10-CM

## 2015-07-20 MED ORDER — ISOSORBIDE MONONITRATE ER 30 MG PO TB24: 15.0000 mg | ORAL_TABLET | Freq: Every day | ORAL | Status: DC

## 2015-07-20 NOTE — Progress Notes (Signed)
07/20/2015 Henry David   [birth year]  [id number]  Primary Physician Sonda Primes, MD Primary Cardiologist: Dr Tresa Endo  HPI:  54 y/o male with a history of anxiety and depression. 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 His mother recently passed and he has been struggling with this. He presented to [facility] ED 07/09/15 with chest pain. His initial EKG showed no acute changes and apparently he was sent back to the waiting room.  He continued to complain of pain and a second EKG showed anterior STEMI. He was transferred to [facility] and underwent urgent cath. This revealed LAD, RCA, and OM disease with mild LVD. He had an LAD PCI with DES followed by a staged RCA DES 07/11/15. The OM lesion did not appear as severe on the second cath. He had some chest pain post PCI but he also is very anxious. He is in the office today for follow up.He denies any further angina. His main complaint is a headache.    Current Outpatient Prescriptions  Medication Sig Dispense Refill  . aspirin EC 81 MG EC tablet Take 1 tablet (81 mg total) by mouth daily.    Marland Kitchen atorvastatin (LIPITOR) 80 MG tablet Take 1 tablet (80 mg total) by mouth daily at 6 PM. 30 tablet 11  . famotidine (PEPCID) 20 MG tablet Take 1 tablet (20 mg total) by mouth 2 (two) times daily. 60 tablet 5  . isosorbide mononitrate (IMDUR) 30 MG 24 hr tablet Take 0.5 tablets (15 mg total) by mouth daily. 15 tablet 11  . lisinopril (PRINIVIL,ZESTRIL) 2.5 MG tablet Take 1 tablet (2.5 mg total) by mouth daily. 30 tablet 11  . metoprolol tartrate (LOPRESSOR) 25 MG tablet Take 1 tablet (25 mg total) by mouth 2 (two) times daily. 60 tablet 11  . sertraline (ZOLOFT) 50 MG tablet Take 1 tablet (50 mg total) by mouth daily. 30 tablet 1  . ticagrelor (BRILINTA) 90 MG TABS tablet Take 1 tablet (90 mg total) by mouth 2 (two) times daily. 60 tablet 0   No current facility-administered medications for this visit.    Allergies  Allergen Reactions  .  Coconut Fatty Acids     unknown    Social History   Social History  . Marital Status: Single    Spouse Name: N/A  . Number of Children: N/A  . Years of Education: N/A   Occupational History  . Not on file.   Social History Main Topics  . Smoking status: Current Every Day Smoker -- 0.50 packs/day    Types: Cigarettes  . Smokeless tobacco: Not on file  . Alcohol Use: 1.8 oz/week    3 Cans of beer per week     Comment: occassional use  . Drug Use: No  . Sexual Activity: Not Currently   Other Topics Concern  . Not on file   Social History Narrative     Review of Systems: General: negative for chills, fever, night sweats or weight changes.  Cardiovascular: negative for chest pain, dyspnea on exertion, edema, orthopnea, palpitations, paroxysmal nocturnal dyspnea or shortness of breath Dermatological: negative for rash Respiratory: negative for cough or wheezing Urologic: negative for hematuria Abdominal: negative for nausea, vomiting, diarrhea, bright red blood per rectum, melena, or hematemesis Neurologic: negative for visual changes, syncope, or dizziness All other systems reviewed and are otherwise negative except as noted above.    Blood pressure 96/66, pulse 68, height  (1.803 m), weight 210 lb 8 oz (  95.482 kg).  General appearance: alert, cooperative, no distress and mildly obese Neck: no carotid bruit and no JVD Lungs: clear to auscultation bilaterally Heart: regular rate and rhythm Neurologic: Grossly normal  EKG NSR-67, Q V2, NSST changes  ASSESSMENT AND PLAN:   STEMI 07/09/15 Anterior STEMI, late presentation to [facility]  CAD S/P LAD and RCA DES LAD DES 07/09/15, staged RCA DES 07/10/18  Prediabetes Hgb A1c- 6.1  Anxiety state On medication  Hyperlipidemia with target LDL less than 70 LDL 138 in hospital- statin added  Smoker Not smoked since his MI/PCI  Cardiomyopathy, ischemic Mild LVD at cath 07/09/15    PLAN  I suggested he cut his  Imdur to 15 mg, if he still has a headache on this dose he can stop it. F/U with Dr Tresa Endo in 2-3 months, check lipids and LFTs before that visit. I congratulated him on not smoking.   Corine Shelter K PA-C 07/20/2015 9:37 AM

## 2015-07-20 NOTE — Assessment & Plan Note (Signed)
Mild LVD at cath 07/09/15

## 2015-07-20 NOTE — Assessment & Plan Note (Deleted)
LDL 138 in hospital- statin added

## 2015-07-20 NOTE — Assessment & Plan Note (Signed)
Anterior STEMI, late presentation to [facility]

## 2015-07-20 NOTE — Assessment & Plan Note (Signed)
LAD DES 07/09/15, staged RCA DES 07/10/18

## 2015-07-20 NOTE — Assessment & Plan Note (Signed)
LDL 138 in hospital- statin added 

## 2015-07-20 NOTE — Assessment & Plan Note (Addendum)
Not smoked since his MI/PCI

## 2015-07-20 NOTE — Assessment & Plan Note (Signed)
Hgb A1c- 6.1

## 2015-07-20 NOTE — Patient Instructions (Signed)
Medication Instructions:  DECREASE Isosorbide Mononitrate (Imdur) to 15 mg - take a half tablet daily.  >>If you continue to have headaches, it is okay to stop the medication  Labwork: Your physician recommends that you return for lab work in at your convenience (before your follow-up with Dr Tresa Endo).  Testing/Procedures: NONE  Follow-Up: Corine Shelter, PA-C, recommends that you schedule a follow-up appointment in [place] with Dr Tresa Endo. You will receive a reminder letter in the mail two months in advance. If you don't receive a letter, please call our office to schedule the follow-up appointment.  If you need a refill on your cardiac medications before your next appointment, please call your pharmacy.

## 2015-07-20 NOTE — Assessment & Plan Note (Signed)
On medication

## 2015-07-31 ENCOUNTER — Ambulatory Visit: Payer: Self-pay | Attending: Family Medicine | Admitting: Family Medicine

## 2015-07-31 ENCOUNTER — Encounter: Payer: Self-pay | Admitting: Family Medicine

## 2015-07-31 ENCOUNTER — Encounter (HOSPITAL_BASED_OUTPATIENT_CLINIC_OR_DEPARTMENT_OTHER): Payer: Self-pay | Admitting: Clinical

## 2015-07-31 VITALS — BP 117/63 | HR 69 | Temp 97.6°F | Resp 16 | Ht 68.5 in | Wt 210.0 lb

## 2015-07-31 DIAGNOSIS — Z6830 Body mass index (BMI) 30.0-30.9, adult: Secondary | ICD-10-CM | POA: Insufficient documentation

## 2015-07-31 DIAGNOSIS — F411 Generalized anxiety disorder: Secondary | ICD-10-CM | POA: Insufficient documentation

## 2015-07-31 DIAGNOSIS — R7303 Prediabetes: Secondary | ICD-10-CM | POA: Insufficient documentation

## 2015-07-31 DIAGNOSIS — Z79899 Other long term (current) drug therapy: Secondary | ICD-10-CM | POA: Insufficient documentation

## 2015-07-31 DIAGNOSIS — Z7982 Long term (current) use of aspirin: Secondary | ICD-10-CM | POA: Insufficient documentation

## 2015-07-31 DIAGNOSIS — E669 Obesity, unspecified: Secondary | ICD-10-CM | POA: Insufficient documentation

## 2015-07-31 DIAGNOSIS — F32A Depression, unspecified: Secondary | ICD-10-CM

## 2015-07-31 DIAGNOSIS — Z9861 Coronary angioplasty status: Secondary | ICD-10-CM | POA: Insufficient documentation

## 2015-07-31 DIAGNOSIS — I2510 Atherosclerotic heart disease of native coronary artery without angina pectoris: Secondary | ICD-10-CM | POA: Insufficient documentation

## 2015-07-31 DIAGNOSIS — Z955 Presence of coronary angioplasty implant and graft: Secondary | ICD-10-CM | POA: Insufficient documentation

## 2015-07-31 DIAGNOSIS — M5431 Sciatica, right side: Secondary | ICD-10-CM | POA: Insufficient documentation

## 2015-07-31 DIAGNOSIS — R351 Nocturia: Secondary | ICD-10-CM | POA: Insufficient documentation

## 2015-07-31 DIAGNOSIS — R51 Headache: Secondary | ICD-10-CM | POA: Insufficient documentation

## 2015-07-31 DIAGNOSIS — R079 Chest pain, unspecified: Secondary | ICD-10-CM | POA: Insufficient documentation

## 2015-07-31 DIAGNOSIS — K219 Gastro-esophageal reflux disease without esophagitis: Secondary | ICD-10-CM | POA: Insufficient documentation

## 2015-07-31 DIAGNOSIS — F418 Other specified anxiety disorders: Secondary | ICD-10-CM

## 2015-07-31 DIAGNOSIS — F329 Major depressive disorder, single episode, unspecified: Secondary | ICD-10-CM

## 2015-07-31 DIAGNOSIS — Z87891 Personal history of nicotine dependence: Secondary | ICD-10-CM | POA: Insufficient documentation

## 2015-07-31 DIAGNOSIS — R519 Headache, unspecified: Secondary | ICD-10-CM

## 2015-07-31 DIAGNOSIS — F419 Anxiety disorder, unspecified: Principal | ICD-10-CM

## 2015-07-31 MED ORDER — CYCLOBENZAPRINE HCL 10 MG PO TABS: 10.0000 mg | ORAL_TABLET | Freq: Three times a day (TID) | ORAL | Status: DC | PRN

## 2015-07-31 MED ORDER — METOPROLOL TARTRATE 25 MG PO TABS: 25.0000 mg | ORAL_TABLET | Freq: Two times a day (BID) | ORAL | Status: DC

## 2015-07-31 MED ORDER — LISINOPRIL 2.5 MG PO TABS: 2.5000 mg | ORAL_TABLET | Freq: Every day | ORAL | Status: DC

## 2015-07-31 MED ORDER — SERTRALINE HCL 50 MG PO TABS: 50.0000 mg | ORAL_TABLET | Freq: Every day | ORAL | Status: DC

## 2015-07-31 MED ORDER — ATORVASTATIN CALCIUM 80 MG PO TABS: 80.0000 mg | ORAL_TABLET | Freq: Every day | ORAL | Status: DC

## 2015-07-31 MED ORDER — TICAGRELOR 90 MG PO TABS: 90.0000 mg | ORAL_TABLET | Freq: Two times a day (BID) | ORAL | Status: DC

## 2015-07-31 MED ORDER — FAMOTIDINE 20 MG PO TABS
20.0000 mg | ORAL_TABLET | Freq: Two times a day (BID) | ORAL | Status: DC
Start: 2015-07-31 — End: 2015-10-04

## 2015-07-31 MED ORDER — HYDROXYZINE HCL 25 MG PO TABS: 25.0000 mg | ORAL_TABLET | Freq: Three times a day (TID) | ORAL | Status: DC | PRN

## 2015-07-31 MED ORDER — ASPIRIN 81 MG PO TBEC: 81.0000 mg | DELAYED_RELEASE_TABLET | Freq: Every day | ORAL | Status: DC

## 2015-07-31 NOTE — Patient Instructions (Signed)
Generalized Anxiety Disorder Generalized anxiety disorder (GAD) is a mental disorder. It interferes with life functions, including relationships, work, and school. GAD is different from normal anxiety, which everyone experiences at some point in their lives in response to specific life events and activities. Normal anxiety actually helps us prepare for and get through these life events and activities. Normal anxiety goes away after the event or activity is over.  GAD causes anxiety that is not necessarily related to specific events or activities. It also causes excess anxiety in proportion to specific events or activities. The anxiety associated with GAD is also difficult to control. GAD can vary from mild to severe. People with severe GAD can have intense waves of anxiety with physical symptoms (panic attacks).  SYMPTOMS The anxiety and worry associated with GAD are difficult to control. This anxiety and worry are related to many life events and activities and also occur more days than not for 6 months or longer. People with GAD also have three or more of the following symptoms (one or more in children):  Restlessness.   Fatigue.  Difficulty concentrating.   Irritability.  Muscle tension.  Difficulty sleeping or unsatisfying sleep. DIAGNOSIS GAD is diagnosed through an assessment by your health care provider. Your health care provider will ask you questions aboutyour mood,physical symptoms, and events in your life. Your health care provider may ask you about your medical history and use of alcohol or drugs, including prescription medicines. Your health care provider may also do a physical exam and blood tests. Certain medical conditions and the use of certain substances can cause symptoms similar to those associated with GAD. Your health care provider may refer you to a mental health specialist for further evaluation. TREATMENT The following therapies are usually used to treat GAD:    Medication. Antidepressant medication usually is prescribed for long-term daily control. Antianxiety medicines may be added in severe cases, especially when panic attacks occur.   Talk therapy (psychotherapy). Certain types of talk therapy can be helpful in treating GAD by providing support, education, and guidance. A form of talk therapy called cognitive behavioral therapy can teach you healthy ways to think about and react to daily life events and activities.  Stress managementtechniques. These include yoga, meditation, and exercise and can be very helpful when they are practiced regularly. A mental health specialist can help determine which treatment is best for you. Some people see improvement with one therapy. However, other people require a combination of therapies.   This information is not intended to replace advice given to you by your health care provider. Make sure you discuss any questions you have with your health care provider.   Document Released: 11/23/2012 Document Revised: 08/19/2014 Document Reviewed: 11/23/2012 Elsevier Interactive Patient Education 2016 [employer]  

## 2015-07-31 NOTE — Progress Notes (Signed)
ASSESSMENT: Pt currently experiencing symptoms of anxiety and depression. Pt needs to f/u with [facility] physician and [facility]; would benefit from psychoeducation and supportive counseling regarding coping with symptoms of anxiety and depression.  Stage of Change: contemplative  PLAN: 1. F/U with behavioral health consultant in one month 2. Psychiatric Medications: hydroxyzine, zoloft. 3. Behavioral recommendation(s):   -Continue quit smoking -Consider walking five minutes/day -Consider importance of self-care, as discussed in office visit -Consider reading educational material regarding coping with anxiety and panic attacks  SUBJECTIVE: Pt. referred by Dr Venetia Night for symptoms of anxiety and depression:  Pt. reports the following symptoms/concerns: Pt states that he has been "dealing with depression and anxiety since 1987", and that nothing has helped except medications. He thinks he may have had panic attacks while driving. Pt says he is thinking about possibly needing to take better care of himself, but he cannot think of anything that might help; he admits he could walk for a few minutes, but probably won't. Quit smoking one month ago, and no alcohol since ED visit 3 weeks ago. He states that "sometimes I have thoughts of death, but I have never wanted to hurt myself". Pt has used both [employer] and [employer] of the [place] previously, says they both cause him further anxiety.  Duration of problem: Most recent-over three weeks Severity: severe  OBJECTIVE: Orientation & Cognition: Oriented x3. Thought processes normal and appropriate to situation. Mood: appropriate. Affect: appropriate Appearance: appropriate Risk of harm to self or others: no known risk of harm to self or others Substance use: none Assessments administered: PHQ9: 24/ GAD7: 21  Diagnosis: Anxiety and depression CPT Code: F41.8 -------------------------------------------- Other(s) present in the room: none  Time spent with  patient in exam room: 25 minutes

## 2015-07-31 NOTE — Progress Notes (Addendum)
Subjective:  Patient ID: Henry David, male    DOB: 09/12/1960  Age: 54 y.o. MRN: [id number]  CC: Hospitalization Follow-up   HPI Henry David is a 54 year old male with a history of anxiety, GERD, coronary artery disease status post PCI with DES to LAD and RCA in 06/2015 (currently on dual antiplatelet therapy with Brilinta and aspirin) who comes into the clinic to establish care. He was last seen by cardiology on 07/20/15 where he had complained of headaches and his isosorbide was cut back from 30 mg to 15 mg.  Today he states he still has headache which is not as severe. He also complains of feeling weak and being short of breath along with intermittent chest pains which he has had ever since his cardiac procedure in 06/2015 but denies any symptoms at this time. He admits to gaining a few pounds ever since he quit smoking a month ago but then states he is not as active and sits at home all day trying to soak through his late moms belongings. He also suffers from anxiety and was placed on Zoloft at his last visit with cardiology; this happens to be the one-year anniversary of his mom's passing and he is taking it real hard as he was an only child.  He complains of right low back numbness that radiates down to the right hip but no pain. Numbness is worse when he tries to walk and is rated as mild. He has noticed waking up at night several times to use the bathroom and has to strain but denies any abdominal pain or fever. Medical history is notable for prediabetes with A1c of 6.1  Outpatient Prescriptions Prior to Visit  Medication Sig Dispense Refill  . aspirin EC 81 MG EC tablet Take 1 tablet (81 mg total) by mouth daily.    Marland Kitchen atorvastatin (LIPITOR) 80 MG tablet Take 1 tablet (80 mg total) by mouth daily at 6 PM. 30 tablet 11  . famotidine (PEPCID) 20 MG tablet Take 1 tablet (20 mg total) by mouth 2 (two) times daily. 60 tablet 5  . isosorbide mononitrate (IMDUR) 30 MG 24 hr tablet  Take 0.5 tablets (15 mg total) by mouth daily. 15 tablet 11  . lisinopril (PRINIVIL,ZESTRIL) 2.5 MG tablet Take 1 tablet (2.5 mg total) by mouth daily. 30 tablet 11  . metoprolol tartrate (LOPRESSOR) 25 MG tablet Take 1 tablet (25 mg total) by mouth 2 (two) times daily. 60 tablet 11  . sertraline (ZOLOFT) 50 MG tablet Take 1 tablet (50 mg total) by mouth daily. 30 tablet 1  . ticagrelor (BRILINTA) 90 MG TABS tablet Take 1 tablet (90 mg total) by mouth 2 (two) times daily. 60 tablet 0   No facility-administered medications prior to visit.    ROS Review of Systems  Constitutional: Negative for activity change and appetite change.  HENT: Negative for sinus pressure and sore throat.   Eyes: Negative for visual disturbance.  Respiratory: Positive for shortness of breath. Negative for cough and chest tightness.   Cardiovascular: Positive for chest pain (intermittent which is absent at this time). Negative for leg swelling.  Gastrointestinal: Negative for abdominal pain, diarrhea, constipation and abdominal distention.  Endocrine: Negative.   Genitourinary:       See history of present illness  Musculoskeletal: Negative for myalgias and joint swelling.  Skin: Negative for rash.  Allergic/Immunologic: Negative.   Neurological: Positive for numbness. Negative for weakness and light-headedness.  Psychiatric/Behavioral: Positive for dysphoric mood. Negative  for suicidal ideas. The patient is nervous/anxious.     Objective:  BP 117/63 mmHg  Pulse 69  Temp(Src) 97.6 F (36.4 C) (Oral)  Resp 16  Ht 5' 8.5" (1.74 m)  Wt 210 lb (95.255 kg)  BMI 31.46 kg/m2  SpO2 96%  BP/Weight 07/31/2015 07/20/2015 07/13/2015  Systolic BP 117 96 115  Diastolic BP 63 66 66  Wt. (Lbs) 210 210.5 -  BMI 31.46 29.37 -      Physical Exam  Constitutional: He is oriented to person, place, and time. He appears well-developed and well-nourished.  Obese  HENT:  Head: Normocephalic and atraumatic.  Right Ear:  External ear normal.  Left Ear: External ear normal.  Eyes: Conjunctivae and EOM are normal. Pupils are equal, round, and reactive to light.  Neck: Normal range of motion. Neck supple. No tracheal deviation present.  Cardiovascular: Normal rate, regular rhythm and normal heart sounds.   No murmur heard. Pulmonary/Chest: Effort normal and breath sounds normal. No respiratory distress. He has no wheezes. He exhibits no tenderness.  Abdominal: Soft. Bowel sounds are normal. He exhibits no mass. There is no tenderness.  Musculoskeletal: Normal range of motion. He exhibits no edema or tenderness.  Neurological: He is alert and oriented to person, place, and time.  Skin: Skin is warm and dry.  Psychiatric:  Depressed    Wt Readings from Last 3 Encounters:  07/31/15 210 lb (95.255 kg)  07/20/15 210 lb 8 oz (95.482 kg)  07/11/15 206 lb 12.7 oz (93.8 kg)    Assessment & Plan:   1. Anxiety state LCSW called in to see the patient as he has just been on an SSRI for just 2 weeks and the effect cannot be felt yet. His chest pains could also be secondary to his anxiety and the fact that this is the anniversary of his mom's passing. Placed on hydroxyzine as per patient request-educated about side effects especially sedation - sertraline (ZOLOFT) 50 MG tablet; Take 1 tablet (50 mg total) by mouth daily.  Dispense: 30 tablet; Refill: 3  2. CAD S/P LAD and RCA DES Aggressive lifestyle modification.  Referred to the pharmacy so paperwork for medication assistance can be completed. - atorvastatin (LIPITOR) 80 MG tablet; Take 1 tablet (80 mg total) by mouth daily at 6 PM.  Dispense: 30 tablet; Refill: 3 - aspirin 81 MG EC tablet; Take 1 tablet (81 mg total) by mouth daily.  Dispense: 30 tablet; Refill: 3 - lisinopril (PRINIVIL,ZESTRIL) 2.5 MG tablet; Take 1 tablet (2.5 mg total) by mouth daily.  Dispense: 30 tablet; Refill: 3 - metoprolol tartrate (LOPRESSOR) 25 MG tablet; Take 1 tablet (25 mg total) by  mouth 2 (two) times daily.  Dispense: 60 tablet; Refill: 3 - ticagrelor (BRILINTA) 90 MG TABS tablet; Take 1 tablet (90 mg total) by mouth 2 (two) times daily.  Dispense: 180 tablet; Refill: 3  3. Prediabetes Diet control. Advised on an exercise regimen and cutting portion sizes; also cut back on starches and increase fruits and vegetables  4. Nocturia May need to be placed on Flomax for lower urinary tract symptoms versus BPH followed Will need to exclude prostate malignancy first - PSA  5. Gastroesophageal reflux disease without esophagitis Controlled - famotidine (PEPCID) 20 MG tablet; Take 1 tablet (20 mg total) by mouth 2 (two) times daily.  Dispense: 60 tablet; Refill: 3  6. Sciatica of right side Demonstrated back exercises - cyclobenzaprine (FLEXERIL) 10 MG tablet; Take 1 tablet (10 mg total) by mouth  3 (three) times daily as needed for muscle spasms.  Dispense: 30 tablet; Refill: 0  7. Obesity This could explain the current shortness of breath he is having as he admits to some recent weight gain ever since he stopped smoking a month ago and so I have discussed a walking regimen with him and he is willing to work on it.  8. Headaches Could be secondary to Imdur which I have discontinued today.  Meds ordered this encounter  Medications  . atorvastatin (LIPITOR) 80 MG tablet    Sig: Take 1 tablet (80 mg total) by mouth daily at 6 PM.    Dispense:  30 tablet    Refill:  3  . famotidine (PEPCID) 20 MG tablet    Sig: Take 1 tablet (20 mg total) by mouth 2 (two) times daily.    Dispense:  60 tablet    Refill:  3  . aspirin 81 MG EC tablet    Sig: Take 1 tablet (81 mg total) by mouth daily.    Dispense:  30 tablet    Refill:  3  . lisinopril (PRINIVIL,ZESTRIL) 2.5 MG tablet    Sig: Take 1 tablet (2.5 mg total) by mouth daily.    Dispense:  30 tablet    Refill:  3  . metoprolol tartrate (LOPRESSOR) 25 MG tablet    Sig: Take 1 tablet (25 mg total) by mouth 2 (two) times  daily.    Dispense:  60 tablet    Refill:  3  . sertraline (ZOLOFT) 50 MG tablet    Sig: Take 1 tablet (50 mg total) by mouth daily.    Dispense:  30 tablet    Refill:  3  . ticagrelor (BRILINTA) 90 MG TABS tablet    Sig: Take 1 tablet (90 mg total) by mouth 2 (two) times daily.    Dispense:  180 tablet    Refill:  3    First script was for 30 days free.  Patient was given card.  . cyclobenzaprine (FLEXERIL) 10 MG tablet    Sig: Take 1 tablet (10 mg total) by mouth 3 (three) times daily as needed for muscle spasms.    Dispense:  30 tablet    Refill:  0    Follow-up: Return in about 1 month (around 08/31/2015) for folllow up of anxiety.   Jaclyn Shaggy MD

## 2015-07-31 NOTE — Progress Notes (Signed)
Patients here for hospital f/up for stemi. Patient states that he's having some SOB. weak and some pain in chest off and on.   Patient reports having numbness in buttocks that radiates down right leg rated at 6/10.  Patient concern with anxiety/panic attacks.

## 2015-07-31 NOTE — Addendum Note (Signed)
Addended by: Jaclyn Shaggy on: 07/31/2015 05:03 PM   Modules accepted: Orders

## 2015-08-01 ENCOUNTER — Other Ambulatory Visit: Payer: Self-pay | Admitting: Family Medicine

## 2015-08-01 ENCOUNTER — Telehealth: Payer: Self-pay

## 2015-08-01 DIAGNOSIS — N139 Obstructive and reflux uropathy, unspecified: Secondary | ICD-10-CM

## 2015-08-01 LAB — PSA: PSA: 0.85 ng/mL (ref <=4.00)

## 2015-08-01 MED ORDER — TAMSULOSIN HCL 0.4 MG PO CAPS: 0.4000 mg | ORAL_CAPSULE | Freq: Every day | ORAL | Status: DC

## 2015-08-01 NOTE — Telephone Encounter (Signed)
-----   Message from Jaclyn Shaggy, MD sent at 08/01/2015  8:53 AM EST ----- His PSA is normal and so i have sent a prescription for flomax to his pharmacy to treat his urinary symptoms which could be an evidence of benign prostatic hyperplasia.

## 2015-08-01 NOTE — Telephone Encounter (Signed)
CMA called patient, patient verified name and DOB. Patient was given lab results with no further questions or concern. Patient verbalized that he understood.

## 2015-08-03 ENCOUNTER — Telehealth: Payer: Self-pay | Admitting: Family Medicine

## 2015-08-03 DIAGNOSIS — F17200 Nicotine dependence, unspecified, uncomplicated: Secondary | ICD-10-CM

## 2015-08-03 NOTE — Telephone Encounter (Signed)
Pt is wondering if there is a medication to help with his tobacco addiction  Pt feels the nicotine patch is too expensive Please call Pt.

## 2015-08-08 DIAGNOSIS — F17200 Nicotine dependence, unspecified, uncomplicated: Secondary | ICD-10-CM | POA: Insufficient documentation

## 2015-08-08 MED ORDER — BUPROPION HCL ER (SR) 150 MG PO TB12: 150.0000 mg | ORAL_TABLET | Freq: Two times a day (BID) | ORAL | Status: DC

## 2015-08-08 NOTE — Telephone Encounter (Signed)
Verified name and date of birth and let patient know about the buproprion ready for him at the pharmacy.  Patient verbalized understanding and appreciated call to check on him.  He states he has not been smoking and is doing it "cold [place]"  RN encouraged him to try the buproprion and patient states he will.  He also reports that he is now eating breakfast each morning and trying to be more active.  RN commended patient and reminded him of his follow up appointment on 08/23/15

## 2015-08-08 NOTE — Telephone Encounter (Signed)
Bupropion sent to pharmacy. Holding off on Chantix due to his Coronary Artery Disease

## 2015-08-08 NOTE — Telephone Encounter (Signed)
Message routed to Dr. Venetia Night for review

## 2015-08-14 MED FILL — SERTRALINE HCL 50 MG TABLET: 50 | 30 days supply | Qty: 30 | Fill #1

## 2015-08-16 ENCOUNTER — Ambulatory Visit: Payer: Self-pay | Attending: Family Medicine

## 2015-08-23 ENCOUNTER — Encounter: Payer: Self-pay | Admitting: Family Medicine

## 2015-08-23 ENCOUNTER — Ambulatory Visit (HOSPITAL_BASED_OUTPATIENT_CLINIC_OR_DEPARTMENT_OTHER): Payer: Self-pay | Admitting: Clinical

## 2015-08-23 ENCOUNTER — Ambulatory Visit: Payer: Self-pay | Attending: Family Medicine | Admitting: Family Medicine

## 2015-08-23 VITALS — BP 96/64 | HR 60 | Temp 98.0°F | Resp 16 | Ht 71.0 in | Wt 211.6 lb

## 2015-08-23 DIAGNOSIS — Z7982 Long term (current) use of aspirin: Secondary | ICD-10-CM | POA: Insufficient documentation

## 2015-08-23 DIAGNOSIS — Z9861 Coronary angioplasty status: Secondary | ICD-10-CM

## 2015-08-23 DIAGNOSIS — F411 Generalized anxiety disorder: Secondary | ICD-10-CM | POA: Insufficient documentation

## 2015-08-23 DIAGNOSIS — R7303 Prediabetes: Secondary | ICD-10-CM | POA: Insufficient documentation

## 2015-08-23 DIAGNOSIS — Z87891 Personal history of nicotine dependence: Secondary | ICD-10-CM | POA: Insufficient documentation

## 2015-08-23 DIAGNOSIS — F4310 Post-traumatic stress disorder, unspecified: Secondary | ICD-10-CM | POA: Insufficient documentation

## 2015-08-23 DIAGNOSIS — J322 Chronic ethmoidal sinusitis: Secondary | ICD-10-CM

## 2015-08-23 DIAGNOSIS — F418 Other specified anxiety disorders: Secondary | ICD-10-CM

## 2015-08-23 DIAGNOSIS — J329 Chronic sinusitis, unspecified: Secondary | ICD-10-CM | POA: Insufficient documentation

## 2015-08-23 DIAGNOSIS — Z9889 Other specified postprocedural states: Secondary | ICD-10-CM | POA: Insufficient documentation

## 2015-08-23 DIAGNOSIS — F329 Major depressive disorder, single episode, unspecified: Secondary | ICD-10-CM

## 2015-08-23 DIAGNOSIS — K219 Gastro-esophageal reflux disease without esophagitis: Secondary | ICD-10-CM | POA: Insufficient documentation

## 2015-08-23 DIAGNOSIS — I2510 Atherosclerotic heart disease of native coronary artery without angina pectoris: Secondary | ICD-10-CM | POA: Insufficient documentation

## 2015-08-23 DIAGNOSIS — F419 Anxiety disorder, unspecified: Principal | ICD-10-CM

## 2015-08-23 DIAGNOSIS — N139 Obstructive and reflux uropathy, unspecified: Secondary | ICD-10-CM

## 2015-08-23 DIAGNOSIS — Z955 Presence of coronary angioplasty implant and graft: Secondary | ICD-10-CM | POA: Insufficient documentation

## 2015-08-23 DIAGNOSIS — E7800 Pure hypercholesterolemia, unspecified: Secondary | ICD-10-CM | POA: Insufficient documentation

## 2015-08-23 MED ORDER — NITROGLYCERIN 0.4 MG SL SUBL: 0.4000 mg | SUBLINGUAL_TABLET | SUBLINGUAL | Status: DC | PRN

## 2015-08-23 MED ORDER — HYDROXYZINE HCL 25 MG PO TABS: 25.0000 mg | ORAL_TABLET | Freq: Three times a day (TID) | ORAL | Status: DC | PRN

## 2015-08-23 MED ORDER — FLUTICASONE PROPIONATE 50 MCG/ACT NA SUSP: 2.0000 | Freq: Every day | NASAL | Status: DC

## 2015-08-23 MED FILL — ?HYDROXYZINE HCL 25 MG TAB: 25 | 30 days supply | Qty: 90 | Fill #0

## 2015-08-23 MED FILL — FLUTICASONE PROP 50 MCG SPR: 50 | 30 days supply | Qty: 16 | Fill #0

## 2015-08-23 MED FILL — NITROSTAT 0.4 MG TABLET SL: 0.4 | 25 days supply | Qty: 25 | Fill #0

## 2015-08-23 NOTE — Patient Instructions (Signed)
Generalized Anxiety Disorder Generalized anxiety disorder (GAD) is a mental disorder. It interferes with life functions, including relationships, work, and school. GAD is different from normal anxiety, which everyone experiences at some point in their lives in response to specific life events and activities. Normal anxiety actually helps us prepare for and get through these life events and activities. Normal anxiety goes away after the event or activity is over.  GAD causes anxiety that is not necessarily related to specific events or activities. It also causes excess anxiety in proportion to specific events or activities. The anxiety associated with GAD is also difficult to control. GAD can vary from mild to severe. People with severe GAD can have intense waves of anxiety with physical symptoms (panic attacks).  SYMPTOMS The anxiety and worry associated with GAD are difficult to control. This anxiety and worry are related to many life events and activities and also occur more days than not for 6 months or longer. People with GAD also have three or more of the following symptoms (one or more in children):  Restlessness.   Fatigue.  Difficulty concentrating.   Irritability.  Muscle tension.  Difficulty sleeping or unsatisfying sleep. DIAGNOSIS GAD is diagnosed through an assessment by your health care provider. Your health care provider will ask you questions aboutyour mood,physical symptoms, and events in your life. Your health care provider may ask you about your medical history and use of alcohol or drugs, including prescription medicines. Your health care provider may also do a physical exam and blood tests. Certain medical conditions and the use of certain substances can cause symptoms similar to those associated with GAD. Your health care provider may refer you to a mental health specialist for further evaluation. TREATMENT The following therapies are usually used to treat GAD:    Medication. Antidepressant medication usually is prescribed for long-term daily control. Antianxiety medicines may be added in severe cases, especially when panic attacks occur.   Talk therapy (psychotherapy). Certain types of talk therapy can be helpful in treating GAD by providing support, education, and guidance. A form of talk therapy called cognitive behavioral therapy can teach you healthy ways to think about and react to daily life events and activities.  Stress managementtechniques. These include yoga, meditation, and exercise and can be very helpful when they are practiced regularly. A mental health specialist can help determine which treatment is best for you. Some people see improvement with one therapy. However, other people require a combination of therapies.   This information is not intended to replace advice given to you by your health care provider. Make sure you discuss any questions you have with your health care provider.   Document Released: 11/23/2012 Document Revised: 08/19/2014 Document Reviewed: 11/23/2012 Elsevier Interactive Patient Education 2016 [employer]  

## 2015-08-23 NOTE — Progress Notes (Signed)
Patient here for follow up on his anxiety and depression Patient states occasionally he is still feeling like a "flutter" on the left side of his chest Also stated he has not smoked since his hospital stay

## 2015-08-23 NOTE — Patient Instructions (Signed)
PLAN:  1. F/U with behavioral health consultant in one month 2. Psychiatric Medications: Atarax, Zoloft, Wellbutrin 3. Behavioral recommendation(s):  -Continue quit smoking/alcohol  -Consider 5 minute walk/day (as discussed at last visit) -Consider relaxation breathing exercises, as practiced in office.

## 2015-08-23 NOTE — Progress Notes (Signed)
Subjective:    Patient ID: Henry David, male    DOB: March 03, 1961, 54 y.o.   MRN: [id number]  HPI Henry David is a 55 year old male with a history o fdepression and  anxiety, GERD, coronary artery disease status post PCI with DES to LAD and RCA in 06/2015 (currently on dual antiplatelet therapy with Brilinta and aspirin) who comes into the clinic for a follow up on depression and anxiety. He has ben on Zoloft for 1 month and Hydroxyzine for 2 weeks and states he has not noticed much improvement; he had a counelling session with the LCSW today. Imdur was discontinued at his last visit due to headaches and he reports improvement in that regard. He was last seen by cardiology on 07/20/15.  Complains of post nasal drip worse n the morning and states he previously received Flonase from his previous PCP which helped his symptoms.  Past Medical History  Diagnosis Date  . Anxiety   . Depression   . Post traumatic stress disorder   . Elevated cholesterol     Past Surgical History  Procedure Laterality Date  . Knee surgery    . Cardiac catheterization N/A 07/09/2015    Procedure: Left Heart Cath and Coronary Angiography;  Surgeon: Lennette Bihari, MD;  Location[facility] INVASIVE CV LAB;  Service: Cardiovascular;  Laterality: N/A;  . Cardiac catheterization N/A 07/09/2015    Procedure: Coronary Stent Intervention;  Surgeon: Lennette Bihari, MD;  Location: [facility] INVASIVE CV LAB;  Service: Cardiovascular;  Laterality: N/A;  . Cardiac catheterization N/A 07/11/2015    Procedure: Coronary Stent Intervention;  Surgeon: Lennette Bihari, MD;  Location: [facility] INVASIVE CV LAB;  Service: Cardiovascular;  Laterality: N/A;    Social History   Social History  . Marital Status: Single    Spouse Name: N/A  . Number of Children: N/A  . Years of Education: N/A   Occupational History  . Not on file.   Social History Main Topics  . Smoking status: Former Smoker -- 0.25 packs/day    Types: Cigarettes  .  Smokeless tobacco: Not on file  . Alcohol Use: 1.8 oz/week    3 Cans of beer per week     Comment: occassional use  . Drug Use: No  . Sexual Activity: Not Currently   Other Topics Concern  . Not on file   Social History Narrative    Allergies  Allergen Reactions  . Coconut Fatty Acids     unknown      Review of Systems  Constitutional: Negative for activity change and appetite change.  HENT: Positive for postnasal drip. Negative for sinus pressure and sore throat.   Eyes: Negative for visual disturbance.  Respiratory: Negative for cough, chest tightness and shortness of breath.   Cardiovascular: Negative for chest pain and leg swelling.       Occasional chest fluttering  Gastrointestinal: Negative for abdominal pain, diarrhea, constipation and abdominal distention.  Endocrine: Negative.   Genitourinary: Negative for dysuria.  Musculoskeletal: Negative for myalgias and joint swelling.  Skin: Negative for rash.  Allergic/Immunologic: Negative.   Neurological: Negative for weakness, light-headedness and numbness.  Psychiatric/Behavioral: Positive for dysphoric mood. Negative for suicidal ideas. The patient is nervous/anxious.        Objective: Filed Vitals:   08/23/15 1446  BP: 96/64  Pulse: 60  Temp: 98 F (36.7 C)  Resp: 16  Height:  (1.803 m)  Weight: 211 lb 9.6 oz (95.981 kg)  SpO2: 98%  Physical Exam Constitutional: He is oriented to person, place, and time. He appears well-developed and well-nourished.  Obese  HENT:  Head: Normocephalic and atraumatic.  Right Ear: External ear normal.  Left Ear: External ear normal.  Eyes: Conjunctivae and EOM are normal. Pupils are equal, round, and reactive to light.  Neck: Normal range of motion. Neck supple. No tracheal deviation present.  Cardiovascular: Normal rate, regular rhythm and normal heart sounds.   No murmur heard. Pulmonary/Chest: Effort normal and breath sounds normal. No respiratory  distress. He has no wheezes. He exhibits no tenderness.  Abdominal: Soft. Bowel sounds are normal. He exhibits no mass. There is no tenderness.  Musculoskeletal: Normal range of motion. He exhibits no edema or tenderness.  Neurological: He is alert and oriented to person, place, and time.  Skin: Skin is warm and dry.  Psychiatric:  Depressed         Assessment & Plan:  1. Anxiety state Has a counselling session with the LCSW today Continue Zoloft and Hydroxyzine - sertraline (ZOLOFT) 50 MG tablet; Take 1 tablet (50 mg total) by mouth daily.  Dispense: 30 tablet; Refill: 3  2. CAD S/P LAD and RCA DES Aggressive lifestyle modification.  Nitroglyccerin prn chest pain Also followed by Cardiology - atorvastatin (LIPITOR) 80 MG tablet; Take 1 tablet (80 mg total) by mouth daily at 6 PM.  Dispense: 30 tablet; Refill: 3 - aspirin 81 MG EC tablet; Take 1 tablet (81 mg total) by mouth daily.  Dispense: 30 tablet; Refill: 3 - lisinopril (PRINIVIL,ZESTRIL) 2.5 MG tablet; Take 1 tablet (2.5 mg total) by mouth daily.  Dispense: 30 tablet; Refill: 3 - metoprolol tartrate (LOPRESSOR) 25 MG tablet; Take 1 tablet (25 mg total) by mouth 2 (two) times daily.  Dispense: 60 tablet; Refill: 3 - ticagrelor (BRILINTA) 90 MG TABS tablet; Take 1 tablet (90 mg total) by mouth 2 (two) times daily.  Dispense: 180 tablet; Refill: 3  3. Prediabetes Diet control. Advised on an exercise regimen and cutting portion sizes; also cut back on starches and increase fruits and vegetables  4. Lower urinary obstructive symptoms Likely from BPH as PSA was normal Symptoms are controlled on Flomax   5. Gastroesophageal reflux disease without esophagitis Controlled - famotidine (PEPCID) 20 MG tablet; Take 1 tablet (20 mg total) by mouth 2 (two) times daily.  Dispense: 60 tablet; Refill: 3  6. Chronic sinusitis Placed on Flonase

## 2015-08-23 NOTE — Progress Notes (Signed)
ASSESSMENT: Pt currently experiencing symptoms of anxiety and depression. Pt needs to f/u with PCP and [facility]; may benefit from brief therapy to cope with symptoms of anxiety and depression.  Stage of Change: contemplative  PLAN: 1. F/U with behavioral health consultant in one month 2. Psychiatric Medications: Atarax, Zoloft, Wellbutrin 3. Behavioral recommendation(s):   -Continue quit smoking/alcohol -Consider relaxation breathing exercises, as practiced in office  SUBJECTIVE: Pt. referred by Dr Venetia Night for symptoms of anxiety and depression:  Pt. reports the following symptoms/concerns: Pt states that he has been taking Wellbutrin, and that he is unsure if it is helping yet or not; nothing else helps his feelings of anxiety/depression. His primary concerns today are anxiety while in the car and feeling like he cannot breathe through his nose; flonase helped years ago with his breathing.  Duration of problem: over a month Severity:severe  OBJECTIVE: Orientation & Cognition: Oriented x3. Thought processes normal and appropriate to situation. Mood: appropriate. Affect: appropriate Appearance: appropriate Risk of harm to self or others: no know risk of harm to self or others Substance use: none Assessments administered: PHQ9: 24/ GAD7: 21  Diagnosis: Anxiety and depression CPT Code: F41.8 -------------------------------------------- Other(s) present in the room: none  Time spent with patient in exam room: 16 minutes

## 2015-08-29 MED FILL — ?FAMOTIDINE 20 MG TABLET: 20 | 30 days supply | Qty: 60 | Fill #1

## 2015-08-29 MED FILL — TAMSULOSIN HCL 0.4 MG CAP: 0.4 | 30 days supply | Qty: 30 | Fill #1

## 2015-09-07 ENCOUNTER — Other Ambulatory Visit: Payer: Self-pay | Admitting: Family Medicine

## 2015-09-07 MED FILL — SERTRALINE HCL 50 MG TABLET: 50 | 30 days supply | Qty: 30 | Fill #0

## 2015-09-07 MED FILL — LISINOPRIL 2.5 MG TABLET: 2.5 | 30 days supply | Qty: 30 | Fill #1

## 2015-09-07 MED FILL — ?METOPROLOL 25 MG TABLET: 25 | 30 days supply | Qty: 60 | Fill #1

## 2015-09-07 MED FILL — ATORVASTATIN 80 MG TABLET: 80 | 30 days supply | Qty: 30 | Fill #1

## 2015-09-07 MED FILL — ?BUPROPION HCL SR 150 MG TA: 150 | 30 days supply | Qty: 60 | Fill #1

## 2015-09-08 MED FILL — CYCLOBENZAPRINE 10 MG TAB: 10 | 10 days supply | Qty: 30 | Fill #0

## 2015-09-12 ENCOUNTER — Telehealth: Payer: Self-pay | Admitting: Cardiovascular Disease

## 2015-09-12 NOTE — Telephone Encounter (Signed)
PT AWARE  BRILINTA 90  MG  SAMPLES LEFT  AT  FRONT  DESK  NUMBER 64 TABS ./CY

## 2015-09-12 NOTE — Telephone Encounter (Signed)
New message      Patient calling the office for samples of medication:   1.  What medication and dosage are you requesting samples for? birlinta 90mg  2.  Are you currently out of this medication? Pt is out and they are trying to get assistance

## 2015-09-20 ENCOUNTER — Other Ambulatory Visit: Payer: Self-pay | Admitting: Family Medicine

## 2015-09-20 DIAGNOSIS — I2510 Atherosclerotic heart disease of native coronary artery without angina pectoris: Secondary | ICD-10-CM

## 2015-09-20 DIAGNOSIS — Z9861 Coronary angioplasty status: Principal | ICD-10-CM

## 2015-09-20 MED ORDER — TICAGRELOR 90 MG PO TABS: 90.0000 mg | ORAL_TABLET | Freq: Two times a day (BID) | ORAL | Status: DC

## 2015-09-27 ENCOUNTER — Other Ambulatory Visit: Payer: Self-pay | Admitting: *Deleted

## 2015-09-27 DIAGNOSIS — Z9861 Coronary angioplasty status: Principal | ICD-10-CM

## 2015-09-27 DIAGNOSIS — I2510 Atherosclerotic heart disease of native coronary artery without angina pectoris: Secondary | ICD-10-CM

## 2015-09-27 MED ORDER — TICAGRELOR 90 MG PO TABS: 90.0000 mg | ORAL_TABLET | Freq: Two times a day (BID) | ORAL | Status: DC

## 2015-09-28 LAB — COMPREHENSIVE METABOLIC PANEL
ALT: 39 U/L (ref 9–46)
AST: 25 U/L (ref 10–35)
Albumin: 4.3 g/dL (ref 3.6–5.1)
Alkaline Phosphatase: 100 U/L (ref 40–115)
BUN: 18 mg/dL (ref 7–25)
CO2: 24 mmol/L (ref 20–31)
Calcium: 9.2 mg/dL (ref 8.6–10.3)
Chloride: 104 mmol/L (ref 98–110)
Creat: 1.01 mg/dL (ref 0.70–1.33)
Glucose, Bld: 105 mg/dL — ABNORMAL HIGH (ref 65–99)
Potassium: 4.8 mmol/L (ref 3.5–5.3)
Sodium: 140 mmol/L (ref 135–146)
Total Bilirubin: 0.5 mg/dL (ref 0.2–1.2)
Total Protein: 7.3 g/dL (ref 6.1–8.1)

## 2015-09-28 LAB — LIPID PANEL
Cholesterol: 124 mg/dL — ABNORMAL LOW (ref 125–200)
HDL: 25 mg/dL — ABNORMAL LOW (ref >=40)
LDL Cholesterol: 63 mg/dL (ref <130)
Total CHOL/HDL Ratio: 5 Ratio (ref <=5.0)
Triglycerides: 181 mg/dL — ABNORMAL HIGH (ref <150)
VLDL: 36 mg/dL — ABNORMAL HIGH (ref <30)

## 2015-10-02 ENCOUNTER — Encounter: Payer: Self-pay | Admitting: *Deleted

## 2015-10-03 MED FILL — METOPROLOL TARTRATE 25 MG T: 25 | 30 days supply | Qty: 60 | Fill #2

## 2015-10-03 MED FILL — FLUTICASONE PROP 50 MCG SPR: 50 | 30 days supply | Qty: 16 | Fill #1

## 2015-10-03 MED FILL — ?BUPROPION HCL SR 150 MG TA: 150 | 30 days supply | Qty: 60 | Fill #2

## 2015-10-03 MED FILL — ?FAMOTIDINE 20 MG TABLET: 20 | 30 days supply | Qty: 60 | Fill #2

## 2015-10-03 MED FILL — ?HYDROXYZINE HCL 25 MG TAB: 25 | 30 days supply | Qty: 90 | Fill #1

## 2015-10-03 MED FILL — TAMSULOSIN HCL 0.4 MG CAP: 0.4 | 30 days supply | Qty: 30 | Fill #2

## 2015-10-03 MED FILL — SERTRALINE HCL 50 MG TABLET: 50 | 30 days supply | Qty: 30 | Fill #1

## 2015-10-03 MED FILL — CYCLOBENZAPRINE 10 MG TAB: 10 | 10 days supply | Qty: 30 | Fill #1

## 2015-10-03 MED FILL — LISINOPRIL 2.5 MG TABLET: 2.5 | 30 days supply | Qty: 30 | Fill #2

## 2015-10-03 MED FILL — ATORVASTATIN 80 MG TABLET: 80 | 30 days supply | Qty: 30 | Fill #2

## 2015-10-04 ENCOUNTER — Ambulatory Visit: Payer: Self-pay | Attending: Family Medicine | Admitting: Family Medicine

## 2015-10-04 ENCOUNTER — Encounter: Payer: Self-pay | Admitting: Family Medicine

## 2015-10-04 VITALS — BP 98/62 | HR 66 | Temp 97.4°F | Resp 15 | Ht 68.0 in | Wt 219.0 lb

## 2015-10-04 DIAGNOSIS — Z955 Presence of coronary angioplasty implant and graft: Secondary | ICD-10-CM | POA: Insufficient documentation

## 2015-10-04 DIAGNOSIS — Z87891 Personal history of nicotine dependence: Secondary | ICD-10-CM | POA: Insufficient documentation

## 2015-10-04 DIAGNOSIS — Z7982 Long term (current) use of aspirin: Secondary | ICD-10-CM | POA: Insufficient documentation

## 2015-10-04 DIAGNOSIS — K219 Gastro-esophageal reflux disease without esophagitis: Secondary | ICD-10-CM

## 2015-10-04 DIAGNOSIS — F17200 Nicotine dependence, unspecified, uncomplicated: Secondary | ICD-10-CM

## 2015-10-04 DIAGNOSIS — Z9861 Coronary angioplasty status: Secondary | ICD-10-CM

## 2015-10-04 DIAGNOSIS — E785 Hyperlipidemia, unspecified: Secondary | ICD-10-CM

## 2015-10-04 DIAGNOSIS — R399 Unspecified symptoms and signs involving the genitourinary system: Secondary | ICD-10-CM

## 2015-10-04 DIAGNOSIS — Z79899 Other long term (current) drug therapy: Secondary | ICD-10-CM | POA: Insufficient documentation

## 2015-10-04 DIAGNOSIS — F418 Other specified anxiety disorders: Secondary | ICD-10-CM

## 2015-10-04 DIAGNOSIS — R7303 Prediabetes: Secondary | ICD-10-CM

## 2015-10-04 DIAGNOSIS — F411 Generalized anxiety disorder: Secondary | ICD-10-CM

## 2015-10-04 DIAGNOSIS — I2510 Atherosclerotic heart disease of native coronary artery without angina pectoris: Secondary | ICD-10-CM

## 2015-10-04 DIAGNOSIS — E669 Obesity, unspecified: Secondary | ICD-10-CM

## 2015-10-04 MED ORDER — ATORVASTATIN CALCIUM 80 MG PO TABS: 80.0000 mg | ORAL_TABLET | Freq: Every day | ORAL | Status: DC

## 2015-10-04 MED ORDER — TAMSULOSIN HCL 0.4 MG PO CAPS: 0.4000 mg | ORAL_CAPSULE | Freq: Every day | ORAL | Status: DC

## 2015-10-04 MED ORDER — BUPROPION HCL ER (SR) 150 MG PO TB12: 150.0000 mg | ORAL_TABLET | Freq: Two times a day (BID) | ORAL | Status: DC

## 2015-10-04 MED ORDER — ASPIRIN 81 MG PO TBEC: 81.0000 mg | DELAYED_RELEASE_TABLET | Freq: Every day | ORAL | Status: DC

## 2015-10-04 MED ORDER — HYDROXYZINE HCL 25 MG PO TABS: 25.0000 mg | ORAL_TABLET | Freq: Three times a day (TID) | ORAL | Status: DC | PRN

## 2015-10-04 MED ORDER — FAMOTIDINE 20 MG PO TABS: 20.0000 mg | ORAL_TABLET | Freq: Two times a day (BID) | ORAL | Status: DC

## 2015-10-04 MED ORDER — LISINOPRIL 2.5 MG PO TABS: 2.5000 mg | ORAL_TABLET | Freq: Every day | ORAL | Status: DC

## 2015-10-04 MED ORDER — METOPROLOL TARTRATE 25 MG PO TABS: 25.0000 mg | ORAL_TABLET | Freq: Two times a day (BID) | ORAL | Status: DC

## 2015-10-04 NOTE — Progress Notes (Signed)
Follow up on anxiety and depression

## 2015-10-04 NOTE — Progress Notes (Signed)
Subjective:  Patient ID: Henry David, male    DOB: 18-Jul-1961  Age: 55 y.o. MRN: [id number]  CC: Follow-up   HPI Henry David presents for a follow-up of depression and anxiety. Medical history is significant for GERD, coronary artery disease status post PCI with DES to LAD and RCA in 06/2015 (currently on dual antiplatelet therapy with Brilinta and aspirin), depression and anxiety. He remains on Zoloft, BuSpar and hydroxyzine and reports that his symptoms of depression and anxiety are "better but not there yet". He still lacks some motivation to get up and move. The passing of his parents still making severely sad and he had seen the LCSW at his last office visit and coping mechanisms discussed including exercise which the patient has not been compliant with.  He remains on atorvastatin for hyperlipidemia and has been taking his Brilinta and aspirin. He does not have any chest pains or shortness of breath at this time and has no reduced exercise tolerance. He has not smoked a cigarette in the last 2 months but is concerned that he has gained a few pounds since quitting in the: He is also taking Wellbutrin which helps  Outpatient Prescriptions Prior to Visit  Medication Sig Dispense Refill  . acetaminophen (TYLENOL) 500 MG tablet Take 500 mg by mouth every 6 (six) hours as needed.    . cyclobenzaprine (FLEXERIL) 10 MG tablet TAKE 1 TABLET BY MOUTH 3 TIMES DAILY AS NEEDED FOR MUSCLE SPASMS. 30 tablet 0  . fluticasone (FLONASE) 50 MCG/ACT nasal spray Place 2 sprays into both nostrils daily. 16 g 1  . nitroGLYCERIN (NITROSTAT) 0.4 MG SL tablet Place 1 tablet (0.4 mg total) under the tongue every 5 (five) minutes as needed for chest pain. Max-3 doses within a 15 minute interval 30 tablet 1  . sertraline (ZOLOFT) 50 MG tablet Take 1 tablet (50 mg total) by mouth daily. 30 tablet 3  . ticagrelor (BRILINTA) 90 MG TABS tablet Take 1 tablet (90 mg total) by mouth 2 (two) times daily. 48 tablet  0  . aspirin 81 MG EC tablet Take 1 tablet (81 mg total) by mouth daily. 30 tablet 3  . atorvastatin (LIPITOR) 80 MG tablet Take 1 tablet (80 mg total) by mouth daily at 6 PM. 30 tablet 3  . buPROPion (WELLBUTRIN SR) 150 MG 12 hr tablet Take 1 tablet (150 mg total) by mouth 2 (two) times daily. 60 tablet 2  . famotidine (PEPCID) 20 MG tablet Take 1 tablet (20 mg total) by mouth 2 (two) times daily. 60 tablet 3  . hydrOXYzine (ATARAX/VISTARIL) 25 MG tablet Take 1 tablet (25 mg total) by mouth 3 (three) times daily as needed. 90 tablet 1  . lisinopril (PRINIVIL,ZESTRIL) 2.5 MG tablet Take 1 tablet (2.5 mg total) by mouth daily. 30 tablet 3  . metoprolol tartrate (LOPRESSOR) 25 MG tablet Take 1 tablet (25 mg total) by mouth 2 (two) times daily. 60 tablet 3  . tamsulosin (FLOMAX) 0.4 MG CAPS capsule Take 1 capsule (0.4 mg total) by mouth daily. 30 capsule 3   No facility-administered medications prior to visit.    ROS Review of Systems  Constitutional: Negative for activity change and appetite change.  HENT: Negative for sinus pressure and sore throat.   Eyes: Negative for visual disturbance.  Respiratory: Negative for cough, chest tightness and shortness of breath.   Cardiovascular: Negative for chest pain and leg swelling.  Gastrointestinal: Negative for abdominal pain, diarrhea, constipation and abdominal distention.  Endocrine: Negative.   Genitourinary: Negative for dysuria.  Musculoskeletal: Negative for myalgias and joint swelling.  Skin: Negative for rash.  Allergic/Immunologic: Negative.   Neurological: Negative for weakness, light-headedness and numbness.  Psychiatric/Behavioral: Positive for dysphoric mood. Negative for suicidal ideas.    Objective:  BP 98/62 mmHg  Pulse 66  Temp(Src) 97.4 F (36.3 C)  Resp 15  Ht 5\' 8"  (1.727 m)  Wt 219 lb (99.338 kg)  BMI 33.31 kg/m2  SpO2 96%  BP/Weight 10/04/2015 08/23/2015 07/31/2015  Systolic BP 98 96 117  Diastolic BP 62 64 63    Wt. (Lbs) 219 211.6 210  BMI 33.31 29.53 31.46      Physical Exam  Constitutional: He is oriented to person, place, and time. He appears well-developed and well-nourished.  Cardiovascular: Normal rate, normal heart sounds and intact distal pulses.   No murmur heard. Pulmonary/Chest: Effort normal and breath sounds normal. He has no wheezes. He has no rales. He exhibits no tenderness.  Abdominal: Soft. Bowel sounds are normal. He exhibits no distension and no mass. There is no tenderness.  Musculoskeletal: Normal range of motion.  Neurological: He is alert and oriented to person, place, and time.  Skin: Skin is warm and dry.  Psychiatric: He has a normal mood and affect.     Lipid Panel     Component Value Date/Time   CHOL 124* 09/27/2015 1500   TRIG 181* 09/27/2015 1500   HDL 25* 09/27/2015 1500   CHOLHDL 5.0 09/27/2015 1500   VLDL 36* 09/27/2015 1500   LDLCALC 63 09/27/2015 1500   LDLDIRECT 103.1 05/22/2007 0922    CMP Latest Ref Rng 09/27/2015 07/13/2015 07/12/2015  Glucose 65 - 99 mg/dL 161(W) 960(A) 540(J)  BUN 7 - 25 mg/dL 18 15 14   Creatinine 0.70 - 1.33 mg/dL 8.11 9.14 7.82  Sodium 135 - 146 mmol/L 140 133(L) 132(L)  Potassium 3.5 - 5.3 mmol/L 4.8 3.8 3.4(L)  Chloride 98 - 110 mmol/L 104 103 101  CO2 20 - 31 mmol/L 24 22 23   Calcium 8.6 - 10.3 mg/dL 9.2 9.5(A) 2.1(H)  Total Protein 6.1 - 8.1 g/dL 7.3 - -  Total Bilirubin 0.2 - 1.2 mg/dL 0.5 - -  Alkaline Phos 40 - 115 U/L 100 - -  AST 10 - 35 U/L 25 - -  ALT 9 - 46 U/L 39 - -     Assessment & Plan:   1. Depression with anxiety Improved but not fully optimized on Zoloft, Buspar and Hydroxyzine Symptoms are also situational and we have discussed coping skills  2. CAD S/P LAD and RCA DES Aggressive risk factor modification Scheduled to see Cardiology tomorrow - lisinopril (PRINIVIL,ZESTRIL) 2.5 MG tablet; Take 1 tablet (2.5 mg total) by mouth daily.  Dispense: 30 tablet; Refill: 3 - metoprolol tartrate  (LOPRESSOR) 25 MG tablet; Take 1 tablet (25 mg total) by mouth 2 (two) times daily.  Dispense: 60 tablet; Refill: 3 - atorvastatin (LIPITOR) 80 MG tablet; Take 1 tablet (80 mg total) by mouth daily at 6 PM.  Dispense: 30 tablet; Refill: 3 - aspirin 81 MG EC tablet; Take 1 tablet (81 mg total) by mouth daily.  Dispense: 30 tablet; Refill: 3  3. Prediabetes A1c 6.1 Discussed lifestyle changes to prevent progression to Diabetes  4. Hyperlipidemia with target LDL less than 70 Controlled Continue Atorvastatin  5. Lower urinary tract symptoms Controlled - tamsulosin (FLOMAX) 0.4 MG CAPS capsule; Take 1 capsule (0.4 mg total) by mouth daily.  Dispense: 30 capsule;  Refill: 3  6. Obesity Advised to commence an exercise regimen, cut back on portion sizes Weight gain could also be secondary to smoking cessatio  7. Anxiety state - hydrOXYzine (ATARAX/VISTARIL) 25 MG tablet; Take 1 tablet (25 mg total) by mouth 3 (three) times daily as needed.  Dispense: 90 tablet; Refill: 1  8. Tobacco use disorder Has not smoked in the last 2 months - buPROPion (WELLBUTRIN SR) 150 MG 12 hr tablet; Take 1 tablet (150 mg total) by mouth 2 (two) times daily.  Dispense: 60 tablet; Refill: 2  9. Gastroesophageal reflux disease without esophagitis - famotidine (PEPCID) 20 MG tablet; Take 1 tablet (20 mg total) by mouth 2 (two) times daily.  Dispense: 60 tablet; Refill: 3   Meds ordered this encounter  Medications  . lisinopril (PRINIVIL,ZESTRIL) 2.5 MG tablet    Sig: Take 1 tablet (2.5 mg total) by mouth daily.    Dispense:  30 tablet    Refill:  3  . hydrOXYzine (ATARAX/VISTARIL) 25 MG tablet    Sig: Take 1 tablet (25 mg total) by mouth 3 (three) times daily as needed.    Dispense:  90 tablet    Refill:  1  . buPROPion (WELLBUTRIN SR) 150 MG 12 hr tablet    Sig: Take 1 tablet (150 mg total) by mouth 2 (two) times daily.    Dispense:  60 tablet    Refill:  2  . metoprolol tartrate (LOPRESSOR) 25 MG tablet     Sig: Take 1 tablet (25 mg total) by mouth 2 (two) times daily.    Dispense:  60 tablet    Refill:  3  . famotidine (PEPCID) 20 MG tablet    Sig: Take 1 tablet (20 mg total) by mouth 2 (two) times daily.    Dispense:  60 tablet    Refill:  3  . tamsulosin (FLOMAX) 0.4 MG CAPS capsule    Sig: Take 1 capsule (0.4 mg total) by mouth daily.    Dispense:  30 capsule    Refill:  3  . atorvastatin (LIPITOR) 80 MG tablet    Sig: Take 1 tablet (80 mg total) by mouth daily at 6 PM.    Dispense:  30 tablet    Refill:  3  . aspirin 81 MG EC tablet    Sig: Take 1 tablet (81 mg total) by mouth daily.    Dispense:  30 tablet    Refill:  3    Follow-up: Return in about 3 months (around 01/01/2016) for Follow-up on depression and anxiety.   Jaclyn Shaggy MD

## 2015-10-04 NOTE — Patient Instructions (Signed)
Smoking Cessation, Tips for Success If you are ready to quit smoking, congratulations! You have chosen to help yourself be healthier. Cigarettes bring nicotine, tar, carbon monoxide, and other irritants into your body. Your lungs, heart, and blood vessels will be able to work better without these poisons. There are many different ways to quit smoking. Nicotine gum, nicotine patches, a nicotine inhaler, or nicotine nasal spray can help with physical craving. Hypnosis, support groups, and medicines help break the habit of smoking. WHAT THINGS CAN I DO TO MAKE QUITTING EASIER?  Here are some tips to help you quit for good:  Pick a date when you will quit smoking completely. Tell all of your friends and family about your plan to quit on that date.  Do not try to slowly cut down on the number of cigarettes you are smoking. Pick a quit date and quit smoking completely starting on that day.  Throw away all cigarettes.   Clean and remove all ashtrays from your home, work, and car.  On a card, write down your reasons for quitting. Carry the card with you and read it when you get the urge to smoke.  Cleanse your body of nicotine. Drink enough water and fluids to keep your urine clear or pale yellow. Do this after quitting to flush the nicotine from your body.  Learn to predict your moods. Do not let a bad situation be your excuse to have a cigarette. Some situations in your life might tempt you into wanting a cigarette.  Never have "just one" cigarette. It leads to wanting another and another. Remind yourself of your decision to quit.  Change habits associated with smoking. If you smoked while driving or when feeling stressed, try other activities to replace smoking. Stand up when drinking your coffee. Brush your teeth after eating. Sit in a different chair when you read the paper. Avoid alcohol while trying to quit, and try to drink fewer caffeinated beverages. Alcohol and caffeine may urge you to  smoke.  Avoid foods and drinks that can trigger a desire to smoke, such as sugary or spicy foods and alcohol.  Ask people who smoke not to smoke around you.  Have something planned to do right after eating or having a cup of coffee. For example, plan to take a walk or exercise.  Try a relaxation exercise to calm you down and decrease your stress. Remember, you may be tense and nervous for the first 2 weeks after you quit, but this will pass.  Find new activities to keep your hands busy. Play with a pen, coin, or rubber band. Doodle or draw things on paper.  Brush your teeth right after eating. This will help cut down on the craving for the taste of tobacco after meals. You can also try mouthwash.   Use oral substitutes in place of cigarettes. Try using lemon drops, carrots, cinnamon sticks, or chewing gum. Keep them handy so they are available when you have the urge to smoke.  When you have the urge to smoke, try deep breathing.  Designate your home as a nonsmoking area.  If you are a heavy smoker, ask your health care provider about a prescription for nicotine chewing gum. It can ease your withdrawal from nicotine.  Reward yourself. Set aside the cigarette money you save and buy yourself something nice.  Look for support from others. Join a support group or smoking cessation program. Ask someone at home or at work to help you with your plan   to quit smoking.  Always ask yourself, "Do I need this cigarette or is this just a reflex?" Tell yourself, "Today, I choose not to smoke," or "I do not want to smoke." You are reminding yourself of your decision to quit.  Do not replace cigarette smoking with electronic cigarettes (commonly called e-cigarettes). The safety of e-cigarettes is unknown, and some may contain harmful chemicals.  If you relapse, do not give up! Plan ahead and think about what you will do the next time you get the urge to smoke. HOW WILL I FEEL WHEN I QUIT SMOKING? You  may have symptoms of withdrawal because your body is used to nicotine (the addictive substance in cigarettes). You may crave cigarettes, be irritable, feel very hungry, cough often, get headaches, or have difficulty concentrating. The withdrawal symptoms are only temporary. They are strongest when you first quit but will go away within 10-14 days. When withdrawal symptoms occur, stay in control. Think about your reasons for quitting. Remind yourself that these are signs that your body is healing and getting used to being without cigarettes. Remember that withdrawal symptoms are easier to treat than the major diseases that smoking can cause.  Even after the withdrawal is over, expect periodic urges to smoke. However, these cravings are generally short lived and will go away whether you smoke or not. Do not smoke! WHAT RESOURCES ARE AVAILABLE TO HELP ME QUIT SMOKING? Your health care provider can direct you to community resources or hospitals for support, which may include:  Group support.  Education.  Hypnosis.  Therapy.   This information is not intended to replace advice given to you by your health care provider. Make sure you discuss any questions you have with your health care provider.   Document Released: 04/26/2004 Document Revised: 08/19/2014 Document Reviewed: 01/14/2013 [employer] Interactive Patient Education 2016 [employer]  

## 2015-10-05 ENCOUNTER — Ambulatory Visit (INDEPENDENT_AMBULATORY_CARE_PROVIDER_SITE_OTHER): Payer: Self-pay | Admitting: Cardiovascular Disease

## 2015-10-05 ENCOUNTER — Encounter: Payer: Self-pay | Admitting: Cardiovascular Disease

## 2015-10-05 VITALS — BP 100/62 | HR 70 | Ht 71.0 in | Wt 216.5 lb

## 2015-10-05 DIAGNOSIS — I237 Postinfarction angina: Secondary | ICD-10-CM

## 2015-10-05 DIAGNOSIS — E785 Hyperlipidemia, unspecified: Secondary | ICD-10-CM

## 2015-10-05 DIAGNOSIS — R0602 Shortness of breath: Secondary | ICD-10-CM

## 2015-10-05 DIAGNOSIS — I2102 ST elevation (STEMI) myocardial infarction involving left anterior descending coronary artery: Secondary | ICD-10-CM

## 2015-10-05 DIAGNOSIS — Z9861 Coronary angioplasty status: Secondary | ICD-10-CM

## 2015-10-05 DIAGNOSIS — E669 Obesity, unspecified: Secondary | ICD-10-CM

## 2015-10-05 DIAGNOSIS — I2510 Atherosclerotic heart disease of native coronary artery without angina pectoris: Secondary | ICD-10-CM

## 2015-10-05 DIAGNOSIS — F418 Other specified anxiety disorders: Secondary | ICD-10-CM

## 2015-10-05 MED ORDER — LISINOPRIL 2.5 MG PO TABS: 2.5000 mg | ORAL_TABLET | Freq: Every day | ORAL | Status: DC

## 2015-10-05 NOTE — Progress Notes (Signed)
Patient ID: [name], male   DOB: March 25, 1961, 55 y.o.   MRN: [id number]     HPI: Henry David is a 55 y.o. male who presents to the office today for initial  follow up cardiology evaluation with me after his ST segment elevation MI on 07/09/2015.  Henry David  Has a history of anxiety , depression and stress disorder.  He had not seen a physician in years in the past was told of having high cholesterol but never received treatment. On November 27 he developed new onset chest pain leading to a [facility] emergency room presentation.  His ECG several hours after presentation revealed anterolateral ST elevation and he was transported to [facility] for emergent catheterization and PCI. Catheterization was performed by me, which revealed mild acute LV dysfunction with mid anterolateral hypocontractility and an EF of 45-50%.  There was significant multivessel CAD the LAD giving rise to a bifurcating diagonal vessel with tubular narrowing of 75-80% and after the takeoff of the proximal ulcerative perforating artery the LAD had a 95% napkin ring stenosis followed by 60% stenosis in its midsegment. Left circumflex coronary artery.  Initially was felt to have an 80% eccentric narrowing in the first marginal branch.  In his RCA was a large dominant vessel with diffuse 80% proximal stenosis.  On that day underwent successful intervention with PTCA stenting of the mid LAD with insertion of a 2.7518 mm Xience Alpine stent postdilated to 2.97 mm in the 100% occlusion reduced to 0%, and PTCA of his diagonal stenosis being reduced to approximate 5%. 2 days later he underwent repeat catheterization which showed patent intervention sites.  His circumflex marginal stenosis had improved and now only appeared 30%. His RCA stenosis appeared 90% and he underwent successful stenting of the RCA  with insertion of a 4.018 mm Xience Alpine DES stent postdilated to 4.51 mm with the stenoses being reduced to 0% in the  staged procedure.  Following his hospitalization, he has done well. He was seen by Kerin Ransom in follow-up evaluation  On 07/20/2015.   Over the past several months, he has continued to remain stable without recurrent chest pain development.  He has continued to be a aspirin and Brilinta 90 mg twice a day for dual antiplatelet therapy.  He has been on metoprolol, tartrate 25 g twice a day and low-dose lisinopril 2.5 mg in the morning for CAD.  He has had issues with low blood pressure.  For his anxiety.  He has been on Wellbutrin 150 mg twice a day.  He is tolerating atorvastatin 80 mg for his hyperlipidemia.  He denies myalgias.  He presents for evaluation.  Past Medical History  Diagnosis Date  . Anxiety   . Depression   . Post traumatic stress disorder   . Elevated cholesterol     Past Surgical History  Procedure Laterality Date  . Knee surgery    . Cardiac catheterization N/A 07/09/2015    Procedure: Left Heart Cath and Coronary Angiography;  Surgeon: Troy Sine, MD;  Location: [place] [facility];  Service: Cardiovascular;  Laterality: N/A;  . Cardiac catheterization N/A 07/09/2015    Procedure: Coronary Stent Intervention;  Surgeon: Troy Sine, MD;  Location: [place] [facility];  Service: Cardiovascular;  Laterality: N/A;  . Cardiac catheterization N/A 07/11/2015    Procedure: Coronary Stent Intervention;  Surgeon: Troy Sine, MD;  Location: [place] [facility];  Service: Cardiovascular;  Laterality: N/A;    Allergies  Allergen Reactions  . Coconut Fatty Acids     unknown    Current Outpatient Prescriptions  Medication Sig Dispense Refill  . acetaminophen (TYLENOL) 500 MG tablet Take 500 mg by mouth every 6 (six) hours as needed.    Marland Kitchen aspirin 81 MG EC tablet Take 1 tablet (81 mg total) by mouth daily. 30 tablet 3  . atorvastatin (LIPITOR) 80 MG tablet Take 1 tablet (80 mg total) by mouth daily at 6 PM. 30 tablet 3  . buPROPion (WELLBUTRIN SR) 150 MG 12 hr tablet  Take 1 tablet (150 mg total) by mouth 2 (two) times daily. 60 tablet 2  . cyclobenzaprine (FLEXERIL) 10 MG tablet TAKE 1 TABLET BY MOUTH 3 TIMES DAILY AS NEEDED FOR MUSCLE SPASMS. 30 tablet 0  . famotidine (PEPCID) 20 MG tablet Take 1 tablet (20 mg total) by mouth 2 (two) times daily. 60 tablet 3  . fluticasone (FLONASE) 50 MCG/ACT nasal spray Place 2 sprays into both nostrils daily. 16 g 1  . hydrOXYzine (ATARAX/VISTARIL) 25 MG tablet Take 1 tablet (25 mg total) by mouth 3 (three) times daily as needed. 90 tablet 1  . lisinopril (PRINIVIL,ZESTRIL) 2.5 MG tablet Take 1 tablet (2.5 mg total) by mouth at bedtime. 30 tablet 3  . metoprolol tartrate (LOPRESSOR) 25 MG tablet Take 1 tablet (25 mg total) by mouth 2 (two) times daily. 60 tablet 3  . nitroGLYCERIN (NITROSTAT) 0.4 MG SL tablet Place 1 tablet (0.4 mg total) under the tongue every 5 (five) minutes as needed for chest pain. Max-3 doses within a 15 minute interval 30 tablet 1  . sertraline (ZOLOFT) 50 MG tablet Take 1 tablet (50 mg total) by mouth daily. 30 tablet 3  . tamsulosin (FLOMAX) 0.4 MG CAPS capsule Take 1 capsule (0.4 mg total) by mouth daily. 30 capsule 3  . ticagrelor (BRILINTA) 90 MG TABS tablet Take 1 tablet (90 mg total) by mouth 2 (two) times daily. 48 tablet 0  . isosorbide mononitrate (IMDUR) 30 MG 24 hr tablet Take 1 tablet by mouth daily.  11   No current facility-administered medications for this visit.    Social History   Social History  . Marital Status: Single    Spouse Name: N/A  . Number of Children: N/A  . Years of Education: N/A   Occupational History  . Not on file.   Social History Main Topics  . Smoking status: Former Smoker -- 0.25 packs/day    Types: Cigarettes    Quit date: 07/04/2015  . Smokeless tobacco: Not on file  . Alcohol Use: 1.8 oz/week    3 Cans of beer per week     Comment: occassional use  . Drug Use: No  . Sexual Activity: Not Currently   Other Topics Concern  . Not on file    Social History Narrative    No family history on file.  ROS General: Negative; No fevers, chills, or night sweats HEENT: Negative; No changes in vision or hearing, sinus congestion, difficulty swallowing Pulmonary: Negative; No cough, wheezing, shortness of breath, hemoptysis Cardiovascular: See HPI: No chest pain, presyncope, syncope, palpatations GI: Negative; No nausea, vomiting, diarrhea, or abdominal pain GU: Negative; No dysuria, hematuria, or difficulty voiding Musculoskeletal: Negative; no myalgias, joint pain, or weakness Hematologic: Negative; no easy bruising, bleeding Endocrine: Negative; no heat/cold intolerance; no diabetes, Neuro: Negative; no changes in balance, headaches Skin: Negative; No rashes or skin lesions Psychiatric: Negative; No behavioral problems, depression Sleep: Negative; No snoring,  daytime  sleepiness, hypersomnolence, bruxism, restless legs, hypnogognic hallucinations. Other comprehensive 14 point system review is negative   Physical Exam BP 100/62 mmHg  Pulse 70  Ht '5\' 11"'$  (1.803 m)  Wt 216 lb 8 oz (98.204 kg)  BMI 30.21 kg/m2 Wt Readings from Last 3 Encounters:  10/05/15 216 lb 8 oz (98.204 kg)  10/04/15 219 lb (99.338 kg)  08/23/15 211 lb 9.6 oz (95.981 kg)   General: Alert, oriented, no distress.  Skin: normal turgor, no rashes, warm and dry HEENT: Normocephalic, atraumatic. Pupils equal round and reactive to light; sclera anicteric; extraocular muscles intact, No lid lag; Nose without nasal septal hypertrophy; Mouth/Parynx benign; Mallinpatti scale 3 Neck: No JVD, no carotid bruits; normal carotid upstroke Lungs: clear to ausculatation and percussion bilaterally; no wheezing or rales, normal inspiratory and expiratory effort Chest wall: without tenderness to palpitation Heart: PMI not displaced, RRR, s1 s2 normal, 1/6 systolic murmur, No diastolic murmur, no rubs, gallops, thrills, or heaves Abdomen: soft, nontender; no  hepatosplenomehaly, BS+; abdominal aorta nontender and not dilated by palpation. Back: no CVA tenderness Pulses: 2+  Musculoskeletal: full range of motion, normal strength, no joint deformities Extremities: Pulses 2+, no clubbing cyanosis or edema, Homan's sign negative  Neurologic: grossly nonfocal; Cranial nerves grossly wnl Psychologic: Normal mood and affect   ECG (independently read by me):  Normal sinus rhythm at 70 bpm. Nonspecific T abnormality in lead 3. Poor progression V1 through V3.Marland Kitchen  Normal intervals.  LABS:  BMP Latest Ref Rng 09/27/2015 07/13/2015 07/12/2015  Glucose 65 - 99 mg/dL 105(H) 115(H) 138(H)  BUN 7 - 25 mg/dL '18 15 14  '$ Creatinine 0.70 - 1.33 mg/dL 1.01 0.94 0.96  Sodium 135 - 146 mmol/L 140 133(L) 132(L)  Potassium 3.5 - 5.3 mmol/L 4.8 3.8 3.4(L)  Chloride 98 - 110 mmol/L 104 103 101  CO2 20 - 31 mmol/L '24 22 23  '$ Calcium 8.6 - 10.3 mg/dL 9.2 8.3(L) 8.4(L)     Hepatic Function Latest Ref Rng 09/27/2015 07/11/2015 05/22/2007  Total Protein 6.1 - 8.1 g/dL 7.3 7.6 7.1  Albumin 3.6 - 5.1 g/dL 4.3 3.9 3.9  AST 10 - 35 U/L 25 196(H) 28  ALT 9 - 46 U/L 39 53 43  Alk Phosphatase 40 - 115 U/L 100 76 78  Total Bilirubin 0.2 - 1.2 mg/dL 0.5 0.9 0.6  Bilirubin, Direct 0.1 - 0.5 mg/dL - <0.1(L) 0.2    CBC Latest Ref Rng 07/13/2015 07/12/2015 07/11/2015  WBC 4.0 - 10.5 K/uL 13.1(H) 14.6(H) 18.8(H)  Hemoglobin 13.0 - 17.0 g/dL 13.0 13.6 14.7  Hematocrit 39.0 - 52.0 % 37.8(L) 39.9 42.5  Platelets 150 - 400 K/uL 249 252 268   Lab Results  Component Value Date   MCV 88.3 07/13/2015   MCV 88.1 07/12/2015   MCV 87.1 07/11/2015    Lab Results  Component Value Date   TSH 2.27 05/22/2007    BNP No results found for: BNP  ProBNP No results found for: PROBNP   Lipid Panel     Component Value Date/Time   CHOL 124* 09/27/2015 1500   TRIG 181* 09/27/2015 1500   HDL 25* 09/27/2015 1500   CHOLHDL 5.0 09/27/2015 1500   VLDL 36* 09/27/2015 1500   LDLCALC 63  09/27/2015 1500   LDLDIRECT 103.1 05/22/2007 0922     RADIOLOGY: No results found.    ASSESSMENT AND PLAN: Henry Arts  Is a 55 year old male who suffered and anterior ST segment elevation myocardial infarction on 07/09/2015 at which time he  underwent successful acute intervention to totally occluded LAD and high-grade diagonal vessel as noted above.  He subsequently underwent staged intervention 2 days later to his RCA.  Clinically, he has felt well without recurrent anginal symptomatology.  He is on a very low-dose medical regimen.  His blood pressure remains somewhat low at 100/62.  He denies any significant episodes of dizziness but at times there has been very mild symptoms.  There is no syncope.  He is unaware of any palpitations.  I have suggested that he try redo changing his lisinopril timing and take the 2.5 mg dose at bedtime.  At this point, I wish to continue him on low-dose ACE inhibitor therapy as long as his blood pressure allows.  He has noticed some mild shortness of breath with activity.  I reviewed his most recent laboratory.  His lipid studies are significantly improved and now reveal a total cholesterol 121 and LDL cholesterol of 63.  However, his triglycerides are still elevated at 181 and HDL cholesterol  Is low at 25.  I would recommend the addition of omega-3 fatty acids to his medical regimen. The patient does have a history of significant anxiety has been long time with him.  Again, reviewing all the catheterization findings in detail, explaining to him events during his hospitalization. Prior to his next office visit with me, I have recommended he undergo a follow-up echo Doppler study to assess LV systolic and diastolic function and valvular architecture following his myocardial infarction. He quit tobacco use the day of his heart attack and has not smoked since. I will see him in 4 months for cardiology reevaluation or sooner if problems arise.   Time spent: 30  minutes  Troy Sine, MD, [facility]  10/05/2015 5:39 PM

## 2015-10-05 NOTE — Patient Instructions (Addendum)
Your physician recommends that you schedule a follow-up appointment in 4 months.  Your physician has requested that you have an echocardiogram. Echocardiography is a painless test that uses sound waves to create images of your heart. It provides your doctor with information about the size and shape of your heart and how well your heart's chambers and valves are working. This procedure takes approximately one hour. There are no restrictions for this procedure. This will be done in 3 months.  Dr Tresa Endo recommends for you to take the lisinopril at night instead of the daytime.  Your physician recommends that you return for lab work fasting.

## 2015-10-30 ENCOUNTER — Other Ambulatory Visit: Payer: Self-pay | Admitting: Family Medicine

## 2015-10-30 DIAGNOSIS — M25519 Pain in unspecified shoulder: Secondary | ICD-10-CM

## 2015-10-30 MED FILL — ?HYDROXYZINE HCL 25 MG TAB: 25 | 30 days supply | Qty: 90 | Fill #0

## 2015-10-30 MED FILL — BUPROPION SR 150 MG TABLET: 150 | 30 days supply | Qty: 60 | Fill #0

## 2015-10-30 MED FILL — LISINOPRIL 2.5 MG TABLET: 2.5 | 30 days supply | Qty: 30 | Fill #3

## 2015-10-30 MED FILL — SERTRALINE HCL 50 MG TABLET: 50 | 30 days supply | Qty: 30 | Fill #2

## 2015-10-30 MED FILL — ATORVASTATIN 80 MG TABLET: 80 | 30 days supply | Qty: 30 | Fill #3

## 2015-10-30 MED FILL — METOPROLOL TARTRATE 25 MG T: 25 | 30 days supply | Qty: 60 | Fill #3

## 2015-10-30 MED FILL — ?TAMSULOSIN HCL 0.4 MG CAP: 0.4 | 30 days supply | Qty: 30 | Fill #3

## 2015-10-30 MED FILL — ?FAMOTIDINE 20 MG TABLET: 20 | 30 days supply | Qty: 60 | Fill #3

## 2015-10-30 MED FILL — FLUTICASONE PROP 50 MCG SPR: 50 | 30 days supply | Qty: 16 | Fill #0

## 2015-11-01 MED FILL — BRILINTA 90 MG TABLET: 90 | 30 days supply | Qty: 60 | Fill #1

## 2015-11-06 MED FILL — CYCLOBENZAPRINE 10 MG TAB: 10 | 10 days supply | Qty: 30 | Fill #0

## 2015-11-27 ENCOUNTER — Other Ambulatory Visit: Payer: Self-pay | Admitting: Family Medicine

## 2015-11-27 MED FILL — FLUTICASONE PROP 50 MCG SPR: 50 | 30 days supply | Qty: 16 | Fill #1

## 2015-11-27 MED FILL — ATORVASTATIN 80 MG TABLET: 80 | 30 days supply | Qty: 30 | Fill #0

## 2015-11-27 MED FILL — SERTRALINE HCL 50 MG TABLET: 50 | 30 days supply | Qty: 30 | Fill #3

## 2015-11-27 MED FILL — LISINOPRIL 2.5 MG TABLET: 2.5 | 30 days supply | Qty: 30 | Fill #0

## 2015-11-27 MED FILL — TAMSULOSIN HCL 0.4 MG CAP: 0.4 | 30 days supply | Qty: 30 | Fill #0

## 2015-11-27 MED FILL — ?CYCLOBENZAPRINE 10 MG TABL: 10 | 10 days supply | Qty: 30 | Fill #0

## 2015-11-27 MED FILL — NITROSTAT 0.4 MG TABLET SL: 0.4 | 25 days supply | Qty: 25 | Fill #1

## 2015-11-27 MED FILL — BRILINTA 90 MG TABLET: 90 | 30 days supply | Qty: 60 | Fill #2

## 2015-11-27 MED FILL — ?HYDROXYZINE HCL 25 MG TAB: 25 | 30 days supply | Qty: 90 | Fill #1

## 2015-11-27 MED FILL — ?FAMOTIDINE 20 MG TABLET: 20 | 30 days supply | Qty: 60 | Fill #0

## 2015-11-29 ENCOUNTER — Telehealth: Payer: Self-pay | Admitting: Family Medicine

## 2015-11-29 NOTE — Telephone Encounter (Signed)
Could you please get in touch with the patient and inform him that a Medicaid denial letter is needed to process his Brilinta through the patient assistance program. In the event that he is unable to provide this letter he would need to get in touch with his cardiologist to see if they have some samples of Brilinta (as we have run out) and if not he would need to inform us so we can switch him to Plavix. Thank you.

## 2015-12-04 ENCOUNTER — Telehealth: Payer: Self-pay | Admitting: Clinical

## 2015-12-04 MED FILL — BUPROPION SR 150 MG TABLET: 150 | 30 days supply | Qty: 60 | Fill #1

## 2015-12-04 NOTE — Telephone Encounter (Signed)
Informed pt that he needs his Medicaid denial letter to process his Brilinta through the patient assistance program. It is also recommended that he get in tough with his cardiologist to obtain samples of Brilinta, as [facility] pharmacy has run out. If he is unable to obtain a Medicaid denial letter, nor obtain samples of Brilinta, the other option is to let us know so we can switch him over to Plavix. Pt says that he does have a Medicaid denial letter given to him in the hospital, and he would prefer to stay on Brilinta; he will bring in the letter to [facility].

## 2015-12-25 MED FILL — METOPROLOL TARTRATE 25 MG T: 25 | 30 days supply | Qty: 60 | Fill #0

## 2015-12-26 ENCOUNTER — Other Ambulatory Visit (HOSPITAL_COMMUNITY): Payer: Self-pay | Admitting: *Deleted

## 2015-12-26 ENCOUNTER — Other Ambulatory Visit: Payer: Self-pay | Admitting: Family Medicine

## 2015-12-26 ENCOUNTER — Telehealth: Payer: Self-pay | Admitting: Family Medicine

## 2015-12-26 ENCOUNTER — Other Ambulatory Visit: Payer: Self-pay

## 2015-12-26 ENCOUNTER — Ambulatory Visit (HOSPITAL_COMMUNITY): Payer: Self-pay | Attending: Cardiology

## 2015-12-26 ENCOUNTER — Encounter (HOSPITAL_COMMUNITY): Payer: Self-pay

## 2015-12-26 DIAGNOSIS — E785 Hyperlipidemia, unspecified: Secondary | ICD-10-CM | POA: Insufficient documentation

## 2015-12-26 DIAGNOSIS — I77810 Thoracic aortic ectasia: Secondary | ICD-10-CM | POA: Insufficient documentation

## 2015-12-26 DIAGNOSIS — Z6830 Body mass index (BMI) 30.0-30.9, adult: Secondary | ICD-10-CM | POA: Insufficient documentation

## 2015-12-26 DIAGNOSIS — R29898 Other symptoms and signs involving the musculoskeletal system: Secondary | ICD-10-CM | POA: Insufficient documentation

## 2015-12-26 DIAGNOSIS — I2510 Atherosclerotic heart disease of native coronary artery without angina pectoris: Secondary | ICD-10-CM | POA: Insufficient documentation

## 2015-12-26 DIAGNOSIS — R0602 Shortness of breath: Secondary | ICD-10-CM | POA: Insufficient documentation

## 2015-12-26 DIAGNOSIS — I5189 Other ill-defined heart diseases: Secondary | ICD-10-CM | POA: Insufficient documentation

## 2015-12-26 DIAGNOSIS — E669 Obesity, unspecified: Secondary | ICD-10-CM | POA: Insufficient documentation

## 2015-12-26 DIAGNOSIS — Z87891 Personal history of nicotine dependence: Secondary | ICD-10-CM | POA: Insufficient documentation

## 2015-12-26 DIAGNOSIS — R7303 Prediabetes: Secondary | ICD-10-CM | POA: Insufficient documentation

## 2015-12-26 DIAGNOSIS — I25118 Atherosclerotic heart disease of native coronary artery with other forms of angina pectoris: Secondary | ICD-10-CM

## 2015-12-26 MED ORDER — PERFLUTREN LIPID MICROSPHERE
1.0000 mL | INTRAVENOUS | Status: AC | PRN
  Administered 2015-12-26: 1 mL via INTRAVENOUS

## 2015-12-26 MED FILL — FLUTICASONE PROP 50 MCG SPR: 50 | 30 days supply | Qty: 16 | Fill #2

## 2015-12-26 MED FILL — ?TAMSULOSIN HCL 0.4 MG CAP: 0.4 | 30 days supply | Qty: 30 | Fill #1

## 2015-12-26 MED FILL — ATORVASTATIN 80 MG TABLET: 80 | 30 days supply | Qty: 30 | Fill #1

## 2015-12-26 MED FILL — $BRILINTA 90 MG TABLET: 90 | 30 days supply | Qty: 60 | Fill #3

## 2015-12-26 MED FILL — ?SERTRALINE HCL 50 MG TABLE: 50 | 30 days supply | Qty: 30 | Fill #0

## 2015-12-26 MED FILL — NITROSTAT 0.4 MG TABLET SL: 0.4 | 25 days supply | Qty: 25 | Fill #0

## 2015-12-26 MED FILL — LISINOPRIL 2.5 MG TABLET: 2.5 | 30 days supply | Qty: 30 | Fill #1

## 2015-12-26 MED FILL — ?CYCLOBENZAPRINE 10 MG TABL: 10 | 10 days supply | Qty: 30 | Fill #0

## 2015-12-26 MED FILL — ?HYDROXYZINE HCL 25 MG TAB: 25 | 30 days supply | Qty: 60 | Fill #1

## 2015-12-26 MED FILL — BUPROPION SR 150 MG TABLET: 150 | 30 days supply | Qty: 60 | Fill #2

## 2015-12-26 MED FILL — ?FAMOTIDINE 20 MG TABLET: 20 | 30 days supply | Qty: 60 | Fill #1

## 2015-12-26 NOTE — Progress Notes (Signed)
[name] presented for an echocardiogram today. Definity was utilized and noticed an apical thrombus in the apex. Dr. Jens Som (Reader of the day) confirmed the thrombus and Dr. Tresa Endo was notified. Dr. Tresa Endo said he should be fine to be discharged home and will contact him as soon as he gets the finalized echo report.  Dewitt Hoes, [place] 12/26/2015

## 2015-12-26 NOTE — Telephone Encounter (Signed)
Pt. Came into facility stating that he had an Echo done and it was determined that he has a blood clot.  Pt. Has an appointment with his Cardiologist on 01/12/16, and it going to f/u with his PCP after his appointment  With his Cardiologist.

## 2015-12-29 ENCOUNTER — Other Ambulatory Visit: Payer: Self-pay

## 2015-12-29 ENCOUNTER — Inpatient Hospital Stay (HOSPITAL_COMMUNITY)
Admission: EM | Admit: 2015-12-29 | Discharge: 2015-12-31 | DRG: 880 | Disposition: A | Discharge status: Home / Self Care | Payer: Self-pay | Attending: Internal Medicine | Admitting: Internal Medicine

## 2015-12-29 ENCOUNTER — Emergency Department (HOSPITAL_COMMUNITY): Payer: Self-pay

## 2015-12-29 ENCOUNTER — Encounter (HOSPITAL_COMMUNITY): Payer: Self-pay

## 2015-12-29 DIAGNOSIS — E669 Obesity, unspecified: Secondary | ICD-10-CM | POA: Diagnosis present

## 2015-12-29 DIAGNOSIS — F4310 Post-traumatic stress disorder, unspecified: Secondary | ICD-10-CM | POA: Diagnosis present

## 2015-12-29 DIAGNOSIS — I10 Essential (primary) hypertension: Secondary | ICD-10-CM | POA: Diagnosis present

## 2015-12-29 DIAGNOSIS — F4000 Agoraphobia, unspecified: Secondary | ICD-10-CM | POA: Diagnosis present

## 2015-12-29 DIAGNOSIS — Z7982 Long term (current) use of aspirin: Secondary | ICD-10-CM

## 2015-12-29 DIAGNOSIS — K219 Gastro-esophageal reflux disease without esophagitis: Secondary | ICD-10-CM | POA: Diagnosis present

## 2015-12-29 DIAGNOSIS — E785 Hyperlipidemia, unspecified: Secondary | ICD-10-CM

## 2015-12-29 DIAGNOSIS — I110 Hypertensive heart disease with heart failure: Secondary | ICD-10-CM | POA: Diagnosis present

## 2015-12-29 DIAGNOSIS — Z9861 Coronary angioplasty status: Secondary | ICD-10-CM

## 2015-12-29 DIAGNOSIS — I513 Intracardiac thrombosis, not elsewhere classified: Secondary | ICD-10-CM | POA: Diagnosis present

## 2015-12-29 DIAGNOSIS — I2510 Atherosclerotic heart disease of native coronary artery without angina pectoris: Secondary | ICD-10-CM

## 2015-12-29 DIAGNOSIS — F418 Other specified anxiety disorders: Secondary | ICD-10-CM

## 2015-12-29 DIAGNOSIS — F419 Anxiety disorder, unspecified: Principal | ICD-10-CM | POA: Diagnosis present

## 2015-12-29 DIAGNOSIS — R079 Chest pain, unspecified: Secondary | ICD-10-CM | POA: Diagnosis present

## 2015-12-29 DIAGNOSIS — N400 Enlarged prostate without lower urinary tract symptoms: Secondary | ICD-10-CM | POA: Diagnosis present

## 2015-12-29 DIAGNOSIS — Z6831 Body mass index (BMI) 31.0-31.9, adult: Secondary | ICD-10-CM

## 2015-12-29 DIAGNOSIS — I252 Old myocardial infarction: Secondary | ICD-10-CM

## 2015-12-29 DIAGNOSIS — Z87891 Personal history of nicotine dependence: Secondary | ICD-10-CM

## 2015-12-29 DIAGNOSIS — I509 Heart failure, unspecified: Secondary | ICD-10-CM | POA: Diagnosis present

## 2015-12-29 DIAGNOSIS — I2102 ST elevation (STEMI) myocardial infarction involving left anterior descending coronary artery: Secondary | ICD-10-CM | POA: Diagnosis present

## 2015-12-29 DIAGNOSIS — Z955 Presence of coronary angioplasty implant and graft: Secondary | ICD-10-CM

## 2015-12-29 DIAGNOSIS — I213 ST elevation (STEMI) myocardial infarction of unspecified site: Secondary | ICD-10-CM

## 2015-12-29 DIAGNOSIS — R0789 Other chest pain: Secondary | ICD-10-CM

## 2015-12-29 DIAGNOSIS — F329 Major depressive disorder, single episode, unspecified: Secondary | ICD-10-CM | POA: Diagnosis present

## 2015-12-29 DIAGNOSIS — Z7902 Long term (current) use of antithrombotics/antiplatelets: Secondary | ICD-10-CM

## 2015-12-29 HISTORY — DX: Diverticulitis of intestine, part unspecified, without perforation or abscess without bleeding: K57.92

## 2015-12-29 HISTORY — DX: Intracardiac thrombosis, not elsewhere classified: I51.3

## 2015-12-29 HISTORY — DX: Essential (primary) hypertension: I10

## 2015-12-29 HISTORY — DX: ST elevation (STEMI) myocardial infarction of unspecified site: I21.3

## 2015-12-29 HISTORY — DX: Benign prostatic hyperplasia without lower urinary tract symptoms: N40.0

## 2015-12-29 LAB — PROTIME-INR
INR: 1.03 (ref 0.00–1.49)
PROTHROMBIN TIME: 13.3 s (ref 11.6–15.2)

## 2015-12-29 LAB — CBC WITH DIFFERENTIAL/PLATELET
BASOS ABS: 0.1 10*3/uL (ref 0.0–0.1)
Basophils Relative: 1 %
EOS PCT: 4 %
Eosinophils Absolute: 0.3 10*3/uL (ref 0.0–0.7)
HEMATOCRIT: 39 % (ref 39.0–52.0)
Hemoglobin: 13.7 g/dL (ref 13.0–17.0)
LYMPHS ABS: 3.1 10*3/uL (ref 0.7–4.0)
LYMPHS PCT: 36 %
MCH: 29.1 pg (ref 26.0–34.0)
MCHC: 35.1 g/dL (ref 30.0–36.0)
MCV: 83 fL (ref 78.0–100.0)
MONO ABS: 0.8 10*3/uL (ref 0.1–1.0)
MONOS PCT: 9 %
Neutro Abs: 4.4 10*3/uL (ref 1.7–7.7)
Neutrophils Relative %: 50 %
PLATELETS: 301 10*3/uL (ref 150–400)
RBC: 4.7 MIL/uL (ref 4.22–5.81)
RDW: 13.2 % (ref 11.5–15.5)
WBC: 8.7 10*3/uL (ref 4.0–10.5)

## 2015-12-29 LAB — COMPREHENSIVE METABOLIC PANEL
ALT: 33 U/L (ref 17–63)
ANION GAP: 7 (ref 5–15)
AST: 25 U/L (ref 15–41)
Albumin: 4.4 g/dL (ref 3.5–5.0)
Alkaline Phosphatase: 87 U/L (ref 38–126)
BILIRUBIN TOTAL: 0.8 mg/dL (ref 0.3–1.2)
BUN: 21 mg/dL — ABNORMAL HIGH (ref 6–20)
CHLORIDE: 106 mmol/L (ref 101–111)
CO2: 24 mmol/L (ref 22–32)
Calcium: 9.1 mg/dL (ref 8.9–10.3)
Creatinine, Ser: 1.08 mg/dL (ref 0.61–1.24)
Glucose, Bld: 105 mg/dL — ABNORMAL HIGH (ref 65–99)
POTASSIUM: 4 mmol/L (ref 3.5–5.1)
Sodium: 137 mmol/L (ref 135–145)
TOTAL PROTEIN: 7.8 g/dL (ref 6.5–8.1)

## 2015-12-29 LAB — APTT: aPTT: 38 seconds — ABNORMAL HIGH (ref 24–37)

## 2015-12-29 LAB — LIPID PANEL
CHOL/HDL RATIO: 6.2 ratio
Cholesterol: 136 mg/dL (ref 0–200)
HDL: 22 mg/dL — AB (ref >40)
LDL Cholesterol: UNDETERMINED mg/dL (ref 0–99)
TRIGLYCERIDES: 485 mg/dL — AB (ref <150)
VLDL: UNDETERMINED mg/dL (ref 0–40)

## 2015-12-29 LAB — TROPONIN I: Troponin I: <0.03 ng/mL (ref <0.031)

## 2015-12-29 LAB — HEPARIN LEVEL (UNFRACTIONATED): HEPARIN UNFRACTIONATED: 0.57 [IU]/mL (ref 0.30–0.70)

## 2015-12-29 MED ORDER — ASPIRIN 81 MG PO TBEC: 81.0000 mg | DELAYED_RELEASE_TABLET | Freq: Every day | ORAL | Status: DC

## 2015-12-29 MED ORDER — ATORVASTATIN CALCIUM 80 MG PO TABS
80.0000 mg | ORAL_TABLET | Freq: Every day | ORAL | Status: DC
  Administered 2015-12-29 – 2015-12-30 (×2): 80 mg via ORAL
  Filled 2015-12-29 (×3): qty 1

## 2015-12-29 MED ORDER — WARFARIN - PHARMACIST DOSING INPATIENT: Freq: Every day | Status: DC

## 2015-12-29 MED ORDER — FLUTICASONE PROPIONATE 50 MCG/ACT NA SUSP
1.0000 | Freq: Every day | NASAL | Status: DC
  Administered 2015-12-30 – 2015-12-31 (×2): 1 via NASAL
  Filled 2015-12-29: qty 16

## 2015-12-29 MED ORDER — METOPROLOL TARTRATE 25 MG PO TABS
25.0000 mg | ORAL_TABLET | Freq: Two times a day (BID) | ORAL | Status: DC
  Administered 2015-12-29 – 2015-12-31 (×5): 25 mg via ORAL
  Filled 2015-12-29 (×5): qty 1

## 2015-12-29 MED ORDER — HEPARIN (PORCINE) IN NACL 100-0.45 UNIT/ML-% IJ SOLN
1500.0000 [IU]/h | INTRAMUSCULAR | Status: DC
  Administered 2015-12-29 – 2015-12-31 (×4): 1500 [IU]/h via INTRAVENOUS
  Filled 2015-12-29 (×4): qty 250

## 2015-12-29 MED ORDER — ALPRAZOLAM 0.5 MG PO TABS
0.5000 mg | ORAL_TABLET | Freq: Every day | ORAL | Status: DC | PRN
  Administered 2015-12-30 – 2015-12-31 (×2): 0.5 mg via ORAL
  Filled 2015-12-29 (×2): qty 1

## 2015-12-29 MED ORDER — FAMOTIDINE 20 MG PO TABS
20.0000 mg | ORAL_TABLET | Freq: Two times a day (BID) | ORAL | Status: DC
  Administered 2015-12-29 – 2015-12-31 (×5): 20 mg via ORAL
  Filled 2015-12-29 (×5): qty 1

## 2015-12-29 MED ORDER — TICAGRELOR 90 MG PO TABS
90.0000 mg | ORAL_TABLET | Freq: Two times a day (BID) | ORAL | Status: DC
  Administered 2015-12-29 – 2015-12-31 (×5): 90 mg via ORAL
  Filled 2015-12-29 (×6): qty 1

## 2015-12-29 MED ORDER — ASPIRIN EC 81 MG PO TBEC: 81.0000 mg | DELAYED_RELEASE_TABLET | Freq: Every day | ORAL | Status: DC

## 2015-12-29 MED ORDER — ASPIRIN 81 MG PO CHEW
324.0000 mg | CHEWABLE_TABLET | Freq: Once | ORAL | Status: AC
  Administered 2015-12-29: 324 mg via ORAL
  Filled 2015-12-29: qty 4

## 2015-12-29 MED ORDER — HEPARIN SODIUM (PORCINE) 5000 UNIT/ML IJ SOLN: 5000.0000 [IU] | Freq: Three times a day (TID) | INTRAMUSCULAR | Status: DC

## 2015-12-29 MED ORDER — LISINOPRIL 2.5 MG PO TABS
2.5000 mg | ORAL_TABLET | Freq: Every day | ORAL | Status: DC
  Administered 2015-12-29 – 2015-12-30 (×2): 2.5 mg via ORAL
  Filled 2015-12-29 (×3): qty 1

## 2015-12-29 MED ORDER — BUPROPION HCL ER (SR) 150 MG PO TB12
150.0000 mg | ORAL_TABLET | Freq: Two times a day (BID) | ORAL | Status: DC
  Administered 2015-12-29 – 2015-12-31 (×5): 150 mg via ORAL
  Filled 2015-12-29 (×6): qty 1

## 2015-12-29 MED ORDER — ACETAMINOPHEN 500 MG PO TABS: 500.0000 mg | ORAL_TABLET | Freq: Four times a day (QID) | ORAL | Status: DC | PRN

## 2015-12-29 MED ORDER — TAMSULOSIN HCL 0.4 MG PO CAPS
0.4000 mg | ORAL_CAPSULE | Freq: Every day | ORAL | Status: DC
  Administered 2015-12-29 – 2015-12-30 (×2): 0.4 mg via ORAL
  Filled 2015-12-29 (×2): qty 1

## 2015-12-29 MED ORDER — CYCLOBENZAPRINE HCL 10 MG PO TABS: 10.0000 mg | ORAL_TABLET | Freq: Three times a day (TID) | ORAL | Status: DC | PRN

## 2015-12-29 MED ORDER — NITROGLYCERIN 0.4 MG SL SUBL: 0.4000 mg | SUBLINGUAL_TABLET | SUBLINGUAL | Status: DC | PRN

## 2015-12-29 MED ORDER — HEPARIN BOLUS VIA INFUSION
4800.0000 [IU] | Freq: Once | INTRAVENOUS | Status: AC
  Administered 2015-12-29: 4800 [IU] via INTRAVENOUS
  Filled 2015-12-29: qty 4800

## 2015-12-29 MED ORDER — ASPIRIN EC 81 MG PO TBEC
81.0000 mg | DELAYED_RELEASE_TABLET | Freq: Every day | ORAL | Status: DC
  Administered 2015-12-30 – 2015-12-31 (×2): 81 mg via ORAL
  Filled 2015-12-29 (×2): qty 1

## 2015-12-29 MED ORDER — WARFARIN SODIUM 5 MG PO TABS
10.0000 mg | ORAL_TABLET | Freq: Once | ORAL | Status: AC
Start: 2015-12-29 — End: 2015-12-29
  Administered 2015-12-29: 10 mg via ORAL
  Filled 2015-12-29: qty 2

## 2015-12-29 MED ORDER — ONDANSETRON HCL 4 MG/2ML IJ SOLN: 4.0000 mg | Freq: Four times a day (QID) | INTRAMUSCULAR | Status: DC | PRN

## 2015-12-29 MED ORDER — HYDROXYZINE HCL 25 MG PO TABS: 25.0000 mg | ORAL_TABLET | Freq: Three times a day (TID) | ORAL | Status: DC | PRN

## 2015-12-29 MED ORDER — SERTRALINE HCL 50 MG PO TABS
50.0000 mg | ORAL_TABLET | Freq: Every day | ORAL | Status: DC
  Administered 2015-12-29 – 2015-12-30 (×2): 50 mg via ORAL
  Filled 2015-12-29 (×2): qty 1

## 2015-12-29 NOTE — Consult Note (Addendum)
CARDIOLOGY CONSULT NOTE       Patient ID: Henry David MRN: [id number] DOB/AGE: 1961-03-30 55 y.o.  Admit date: 12/29/2015 Referring Physician:  Clyde Lundborg Primary Physician: Jaclyn Shaggy, MD Primary Cardiologist:  Daphene Jaeger Reason for Consultation: Chest Pain  Principal Problem:   Chest pain Active Problems:   Depression with anxiety   GERD   STEMI 07/09/15   Hyperlipidemia with target LDL less than 70   CAD S/P LAD and RCA DES   Presence of drug coated stent in LAD coronary artery   Obesity   Apical mural thrombus (HCC)   Essential hypertension   BPH (benign prostatic hyperplasia)   HPI:   55 y.o. admitted with chest pain  Has a history of anxiety , depression and stress disorder.On July 09, 2015  he developed new onset chest pain leading to a [facility] emergency room presentation. His ECG several hours after presentation revealed anterolateral ST elevation and he was transported to [facility] for emergent catheterization and PCI. Catheterization  revealed mild acute LV dysfunction with mid anterolateral hypocontractility and an EF of 45-50%. There was significant multivessel CAD the LAD giving rise to a bifurcating diagonal vessel with tubular narrowing of 75-80% and after the takeoff of the proximal ulcerative perforating artery the LAD had a 95% napkin ring stenosis followed by 60% stenosis in its midsegment. Left circumflex coronary artery. Initially was felt to have an 80% eccentric narrowing in the first marginal branch. In his RCA was a large dominant vessel with diffuse 80% proximal stenosis. He underwent successful intervention with PTCA stenting of the mid LAD with insertion of a 2.7518 mm Xience Alpine stent , and PTCA of his diagonal stenosis . 2 days later he underwent repeat catheterization which showed patent intervention sites. His circumflex marginal stenosis had improved and now only appeared 30%. His RCA stenosis appeared 90% and he underwent  successful stenting of the RCA with insertion of a 4.018 mm Xience Alpine DES stent   Had a f/u echo 12/26/15 which I reviewed. EF 40-45% with two areas of small mural apical thrombus best seen with definity.  This has not been addressed by Dr Tresa Endo yet  Admitted with atypical SSCP.  "I may have gotten anxious"  Sharp intermit ant left sided pain  Not like his MI pain that had some GI overtones. Pain free this am Pain was not exertional and was not really helped by nitro.  Enzymes have been negative   .  ROS All other systems reviewed and negative except as noted above  Past Medical History  Diagnosis Date  . Anxiety   . Depression   . Post traumatic stress disorder   . Elevated cholesterol   . STEMI (ST elevation myocardial infarction) (HCC)   . Diverticulitis   . Apical mural thrombus (HCC)   . Essential hypertension   . BPH (benign prostatic hyperplasia)     Family History  Problem Relation Age of Onset  . Heart disease Mother   . Heart disease Father     Social History   Social History  . Marital Status: Single    Spouse Name: N/A  . Number of Children: N/A  . Years of Education: N/A   Occupational History  . Not on file.   Social History Main Topics  . Smoking status: Former Smoker -- 0.25 packs/day    Types: Cigarettes    Quit date: 07/04/2015  . Smokeless tobacco: Not on file  . Alcohol Use: 1.8 oz/week  3 Cans of beer per week     Comment: occassional use  . Drug Use: No  . Sexual Activity: Not Currently   Other Topics Concern  . Not on file   Social History Narrative    Past Surgical History  Procedure Laterality Date  . Knee surgery    . Cardiac catheterization N/A 07/09/2015    Procedure: Left Heart Cath and Coronary Angiography;  Surgeon: Lennette Bihari, MD;  Location: [facility];  Service: Cardiovascular;  Laterality: N/A;  . Cardiac catheterization N/A 07/09/2015    Procedure: Coronary Stent Intervention;  Surgeon: Lennette Bihari,  MD;  Location: [facility] INVASIVE [facility];  Service: Cardiovascular;  Laterality: N/A;  . Cardiac catheterization N/A 07/11/2015    Procedure: Coronary Stent Intervention;  Surgeon: Lennette Bihari, MD;  Location: [facility] INVASIVE [facility];  Service: Cardiovascular;  Laterality: N/A;     . [START ON 12/30/2015] aspirin EC  81 mg Oral Daily  . atorvastatin  80 mg Oral q1800  . buPROPion  150 mg Oral BID  . famotidine  20 mg Oral BID  . fluticasone  1 spray Each Nare Daily  . heparin  5,000 Units Subcutaneous Q8H  . lisinopril  2.5 mg Oral QHS  . metoprolol tartrate  25 mg Oral BID  . sertraline  50 mg Oral Daily  . tamsulosin  0.4 mg Oral Daily  . ticagrelor  90 mg Oral BID      Physical Exam: Blood pressure 100/65, pulse 64, temperature 98.1 F (36.7 C), temperature source Oral, resp. rate 16, height  (1.778 m), weight 99.8 kg (220 lb 0.3 oz), SpO2 100 %.    Anxious  Healthy:  appears stated age HEENT: normal Neck supple with no adenopathy JVP normal no bruits no thyromegaly Lungs clear with no wheezing and good diaphragmatic motion Heart:  S1/S2 no murmur, no rub, gallop or click PMI normal Abdomen: benighn, BS positve, no tenderness, no AAA no bruit.  No HSM or HJR Distal pulses intact with no bruits No edema Neuro non-focal Skin warm and dry No muscular weakness   Labs:   Lab Results  Component Value Date   WBC 8.7 12/29/2015   HGB 13.7 12/29/2015   HCT 39.0 12/29/2015   MCV 83.0 12/29/2015   PLT 301 12/29/2015    Recent Labs Lab 12/29/15 0350  NA 137  K 4.0  CL 106  CO2 24  BUN 21*  CREATININE 1.08  CALCIUM 9.1  PROT 7.8  BILITOT 0.8  ALKPHOS 87  ALT 33  AST 25  GLUCOSE 105*   Lab Results  Component Value Date   TROPONINI <0.03 12/29/2015    Lab Results  Component Value Date   CHOL 136 12/29/2015   CHOL 124* 09/27/2015   CHOL 206* 07/11/2015   Lab Results  Component Value Date   HDL 22* 12/29/2015   HDL 25* 09/27/2015   HDL 43 07/11/2015    Lab Results  Component Value Date   LDLCALC UNABLE TO CALCULATE IF TRIGLYCERIDE OVER 400 mg/dL 16/05/9603   LDLCALC 63 09/27/2015   LDLCALC 135* 07/11/2015   Lab Results  Component Value Date   TRIG 485* 12/29/2015   TRIG 181* 09/27/2015   TRIG 138 07/11/2015   Lab Results  Component Value Date   CHOLHDL 6.2 12/29/2015   CHOLHDL 5.0 09/27/2015   CHOLHDL 4.8 07/11/2015   Lab Results  Component Value Date   LDLDIRECT 103.1 05/22/2007  Radiology: Dg Chest 2 View  12/29/2015  CLINICAL DATA:  Chest pain, onset this morning. Anxious with tachycardia. History of heart disease. Previous smoker. EXAM: CHEST  2 VIEW COMPARISON:  07/09/2015 FINDINGS: The heart size and mediastinal contours are within normal limits. Both lungs are clear. The visualized skeletal structures are unremarkable. IMPRESSION: No active cardiopulmonary disease. Electronically Signed   By: Burman Nieves M.D.   On: 12/29/2015 04:41    EKG: SR old anterior MI mild J point elevation lead F no change from 10/05/15   ASSESSMENT AND PLAN:  Chest Pain: 6 months post DES to LAD and RCA with POB of diagonal.  Compliant with DAT. Enzymes negative no ECG changes. Atypical and now resolved Do not think cath or myovue currently indicated Continue medical RX DAT and beta blocker  CHF:  EF 40-45% euvolemic CXR clear continue ACE no indication for AICD  Thrombus:  Post anterior MI His agoraphobia and anxiety preclude further delineation with MRI  Start heparin and coumadin   Signed: Charlton Haws 12/29/2015, 9:29 AM

## 2015-12-29 NOTE — Progress Notes (Signed)
ANTICOAGULATION CONSULT NOTE - Initial Consult  Pharmacy Consult for heparin/warfarin Indication: mural apical thrombus  Allergies  Allergen Reactions  . Coconut Fatty Acids     unknown    Patient Measurements: Height:  (177.8 cm) Weight: 220 lb 0.3 oz (99.8 kg) IBW/kg (Calculated) : 73 Heparin Dosing Weight: 93.8 kg  Vital Signs: Temp: 98.1 F (36.7 C) (05/19 0754) Temp Source: Oral (05/19 0754) BP: 100/65 mmHg (05/19 0754) Pulse Rate: 64 (05/19 0754)  Labs:  Recent Labs  12/29/15 0350 12/29/15 0627  HGB 13.7  --   HCT 39.0  --   PLT 301  --   APTT 38*  --   LABPROT 13.3  --   INR 1.03  --   CREATININE 1.08  --   TROPONINI <0.03 <0.03    Estimated Creatinine Clearance: 90.4 mL/min (by C-G formula based on Cr of 1.08).   Medical History: Past Medical History  Diagnosis Date  . Anxiety   . Depression   . Post traumatic stress disorder   . Elevated cholesterol   . STEMI (ST elevation myocardial infarction) (HCC)   . Diverticulitis   . Apical mural thrombus (HCC)   . Essential hypertension   . BPH (benign prostatic hyperplasia)     Medications:  Prescriptions prior to admission  Medication Sig Dispense Refill Last Dose  . acetaminophen (TYLENOL) 500 MG tablet Take 500 mg by mouth every 6 (six) hours as needed.   prn  . aspirin 81 MG EC tablet Take 1 tablet (81 mg total) by mouth daily. 30 tablet 3 12/29/2015 at Unknown time  . atorvastatin (LIPITOR) 80 MG tablet Take 1 tablet (80 mg total) by mouth daily at 6 PM. 30 tablet 3 12/28/2015 at Unknown time  . buPROPion (WELLBUTRIN SR) 150 MG 12 hr tablet Take 1 tablet (150 mg total) by mouth 2 (two) times daily. 60 tablet 2 12/28/2015 at Unknown time  . cyclobenzaprine (FLEXERIL) 10 MG tablet TAKE 1 TABLET BY MOUTH 3 TIMES DAILY AS NEEDED FOR MUSCLE SPASMS. 30 tablet 0 Past Week at Unknown time  . famotidine (PEPCID) 20 MG tablet Take 1 tablet (20 mg total) by mouth 2 (two) times daily. 60 tablet 3  12/28/2015 at Unknown time  . fluticasone (FLONASE) 50 MCG/ACT nasal spray PLACE 2 SPRAYS INTO BOTH NOSTRILS DAILY. 16 g 2 Past Week at Unknown time  . hydrOXYzine (ATARAX/VISTARIL) 25 MG tablet Take 1 tablet (25 mg total) by mouth 3 (three) times daily as needed. 90 tablet 1 Past Week at Unknown time  . lisinopril (PRINIVIL,ZESTRIL) 2.5 MG tablet Take 1 tablet (2.5 mg total) by mouth at bedtime. 30 tablet 3 12/28/2015 at Unknown time  . metoprolol tartrate (LOPRESSOR) 25 MG tablet Take 1 tablet (25 mg total) by mouth 2 (two) times daily. 60 tablet 3 12/28/2015 at 2100  . NITROSTAT 0.4 MG SL tablet PLACE 1 TABLET UNDER THE TONGUE EVERY 5 MINUTES AS NEEDED FOR CHEST PAIN. MAX-3 DOSES WITHIN A 15 MINUTE INTERVAL 25 tablet 1 12/29/2015 at Unknown time  . sertraline (ZOLOFT) 50 MG tablet TAKE 1 TABLET BY MOUTH DAILY 30 tablet 3 12/28/2015 at Unknown time  . tamsulosin (FLOMAX) 0.4 MG CAPS capsule Take 1 capsule (0.4 mg total) by mouth daily. 30 capsule 3 12/28/2015 at Unknown time  . ticagrelor (BRILINTA) 90 MG TABS tablet Take 1 tablet (90 mg total) by mouth 2 (two) times daily. 48 tablet 0 12/28/2015 at Unknown time    Assessment: Pharmacy is consulted  to dose heparin and warfarin in 55 yo male diagnosed with mural apical thrombus s/p post anterior STEMI in 06/2015 on a 2D echo on 12/26/15. Pt not noted to be on any anticoagulation prior to admission. Pt is on dual antiplatelet therapy with aspirin 81 mg PO daily and ticagrelor 90 mg PO BID.  Baseline Labs:   INR 1.03, PT 13.2  Hgb, plt WNL  Scr 1.08   LFT, albumin WNL  12/29/2015     Goal of Therapy:  INR 2-3  Anti-Xa level 0.3-0.7 Monitor platelets by anticoagulation protocol: Yes   Plan:   Warfarin 10 mg PO x1   Heparin 4800 unit IV bolus x1 , then heparin 1500 units/hr  Daily Heparin levels, PT/INR, CBC  Monitor for signs/symptoms of bleeding    Adalberto Cole, PharmD, BCPS Pager [phone] 12/29/2015 10:51 AM

## 2015-12-29 NOTE — Discharge Instructions (Signed)

## 2015-12-29 NOTE — Progress Notes (Signed)
CSW is unable to assist with obtaining meds at d/c. RNCM may be able to assist with this request.  Cori Razor LCSW [phone]

## 2015-12-29 NOTE — ED Notes (Signed)
Admitting MD at bedside.

## 2015-12-29 NOTE — ED Provider Notes (Signed)
CSN: [id number]     Arrival date & time 12/29/15  0325 History   First MD Initiated Contact with Patient 12/29/15 0357     Chief Complaint  Patient presents with  . Chest Pain     (Consider location/radiation/quality/duration/timing/severity/associated sxs/prior Treatment) HPI Comments: Patient presents to the emergency department for evaluation of chest pain. Patient reports that he had onset of discomfort in the chest around 10:30 PM last night. He initially thought it was indigestion, took Tums but it did not help. Patient reports that he then developed more of a tightness in the lateral left chest region. Patient reports this was more consistent with the pain he had with previous MI. Patient denies associated shortness of breath, nausea or diaphoresis. He did take 2 nitroglycerin at home. He did not have improvement with nitroglycerin, but reports the symptoms have slowly improved over time.  Patient does report that he has a history of anxiety disorder. After he started having pain in the chest to start having symptoms that he recognized as anxiety and panic attack. He does not, however, think that all of his symptoms were secondary to panic attack.  Patient is a 55 y.o. male presenting with chest pain.  Chest Pain   Past Medical History  Diagnosis Date  . Anxiety   . Depression   . Post traumatic stress disorder   . Elevated cholesterol    Past Surgical History  Procedure Laterality Date  . Knee surgery    . Cardiac catheterization N/A 07/09/2015    Procedure: Left Heart Cath and Coronary Angiography;  Surgeon: Lennette Bihari, MD;  Location: [facility] INVASIVE CV LAB;  Service: Cardiovascular;  Laterality: N/A;  . Cardiac catheterization N/A 07/09/2015    Procedure: Coronary Stent Intervention;  Surgeon: Lennette Bihari, MD;  Location: [facility] INVASIVE CV LAB;  Service: Cardiovascular;  Laterality: N/A;  . Cardiac catheterization N/A 07/11/2015    Procedure: Coronary Stent Intervention;   Surgeon: Lennette Bihari, MD;  Location: [facility] INVASIVE CV LAB;  Service: Cardiovascular;  Laterality: N/A;   History reviewed. No pertinent family history. Social History  Substance Use Topics  . Smoking status: Former Smoker -- 0.25 packs/day    Types: Cigarettes    Quit date: 07/04/2015  . Smokeless tobacco: None  . Alcohol Use: 1.8 oz/week    3 Cans of beer per week     Comment: occassional use    Review of Systems  Cardiovascular: Positive for chest pain.  Psychiatric/Behavioral: The patient is nervous/anxious.   All other systems reviewed and are negative.     Allergies  Coconut fatty acids  Home Medications   Prior to Admission medications   Medication Sig Start Date End Date Taking? Authorizing Provider  acetaminophen (TYLENOL) 500 MG tablet Take 500 mg by mouth every 6 (six) hours as needed.   Yes Historical Provider, MD  aspirin 81 MG EC tablet Take 1 tablet (81 mg total) by mouth daily. 10/04/15  Yes Jaclyn Shaggy, MD  atorvastatin (LIPITOR) 80 MG tablet Take 1 tablet (80 mg total) by mouth daily at 6 PM. 10/04/15  Yes Jaclyn Shaggy, MD  buPROPion (WELLBUTRIN SR) 150 MG 12 hr tablet Take 1 tablet (150 mg total) by mouth 2 (two) times daily. 10/04/15  Yes Jaclyn Shaggy, MD  cyclobenzaprine (FLEXERIL) 10 MG tablet TAKE 1 TABLET BY MOUTH 3 TIMES DAILY AS NEEDED FOR MUSCLE SPASMS. 12/26/15  Yes Jaclyn Shaggy, MD  famotidine (PEPCID) 20 MG tablet Take 1 tablet (20 mg total)  by mouth 2 (two) times daily. 10/04/15  Yes Jaclyn Shaggy, MD  fluticasone (FLONASE) 50 MCG/ACT nasal spray PLACE 2 SPRAYS INTO BOTH NOSTRILS DAILY. 10/30/15  Yes Jaclyn Shaggy, MD  hydrOXYzine (ATARAX/VISTARIL) 25 MG tablet Take 1 tablet (25 mg total) by mouth 3 (three) times daily as needed. 10/04/15  Yes Jaclyn Shaggy, MD  lisinopril (PRINIVIL,ZESTRIL) 2.5 MG tablet Take 1 tablet (2.5 mg total) by mouth at bedtime. 10/05/15  Yes Lennette Bihari, MD  metoprolol tartrate (LOPRESSOR) 25 MG tablet Take 1 tablet (25 mg  total) by mouth 2 (two) times daily. 10/04/15  Yes Jaclyn Shaggy, MD  NITROSTAT 0.4 MG SL tablet PLACE 1 TABLET UNDER THE TONGUE EVERY 5 MINUTES AS NEEDED FOR CHEST PAIN. MAX-3 DOSES WITHIN A 15 MINUTE INTERVAL 12/26/15  Yes Jaclyn Shaggy, MD  sertraline (ZOLOFT) 50 MG tablet TAKE 1 TABLET BY MOUTH DAILY 12/26/15  Yes Jaclyn Shaggy, MD  tamsulosin (FLOMAX) 0.4 MG CAPS capsule Take 1 capsule (0.4 mg total) by mouth daily. 10/04/15  Yes Jaclyn Shaggy, MD  ticagrelor (BRILINTA) 90 MG TABS tablet Take 1 tablet (90 mg total) by mouth 2 (two) times daily. 09/27/15  Yes Lennette Bihari, MD   BP 100/71 mmHg  Pulse 62  Temp(Src) 98.1 F (36.7 C) (Oral)  Resp 16  Ht  (1.778 m)  Wt 215 lb (97.523 kg)  BMI 30.85 kg/m2  SpO2 96% Physical Exam  Constitutional: He is oriented to person, place, and time. He appears well-developed and well-nourished. No distress.  HENT:  Head: Normocephalic and atraumatic.  Right Ear: Hearing normal.  Left Ear: Hearing normal.  Nose: Nose normal.  Mouth/Throat: Oropharynx is clear and moist and mucous membranes are normal.  Eyes: Conjunctivae and EOM are normal. Pupils are equal, round, and reactive to light.  Neck: Normal range of motion. Neck supple.  Cardiovascular: Regular rhythm, S1 normal and S2 normal.  Exam reveals no gallop and no friction rub.   No murmur heard. Pulmonary/Chest: Effort normal and breath sounds normal. No respiratory distress. He exhibits no tenderness.  Abdominal: Soft. Normal appearance and bowel sounds are normal. There is no hepatosplenomegaly. There is no tenderness. There is no rebound, no guarding, no tenderness at McBurney's point and negative Murphy's sign. No hernia.  Musculoskeletal: Normal range of motion.  Neurological: He is alert and oriented to person, place, and time. He has normal strength. No cranial nerve deficit or sensory deficit. Coordination normal. GCS eye subscore is 4. GCS verbal subscore is 5. GCS motor subscore is 6.   Skin: Skin is warm, dry and intact. No rash noted. No cyanosis.  Psychiatric: He has a normal mood and affect. His speech is normal and behavior is normal. Thought content normal.  Nursing note and vitals reviewed.   ED Course  Procedures (including critical care time) Labs Review Labs Reviewed  COMPREHENSIVE METABOLIC PANEL - Abnormal; Notable for the following:    Glucose, Bld 105 (*)    BUN 21 (*)    All other components within normal limits  CBC WITH DIFFERENTIAL/PLATELET  TROPONIN I    Imaging Review Dg Chest 2 View  12/29/2015  CLINICAL DATA:  Chest pain, onset this morning. Anxious with tachycardia. History of heart disease. Previous smoker. EXAM: CHEST  2 VIEW COMPARISON:  07/09/2015 FINDINGS: The heart size and mediastinal contours are within normal limits. Both lungs are clear. The visualized skeletal structures are unremarkable. IMPRESSION: No active cardiopulmonary disease. Electronically Signed   By: Burman Nieves  M.D.   On: 12/29/2015 04:41   I have personally reviewed and evaluated these images and lab results as part of my medical decision-making.   EKG Interpretation   Date/Time:  Friday Dec 29 2015 03:33:53 EDT Ventricular Rate:  78 PR Interval:  159 QRS Duration: 96 QT Interval:  369 QTC Calculation: 420 R Axis:   83 Text Interpretation:  Sinus rhythm Anterior infarct, old Confirmed by  POLLINA  MD, CHRISTOPHER [phone]) on 12/29/2015 3:59:20 AM      MDM   Final diagnoses:  Chest pain, unspecified chest pain type    Patient with history of coronary artery disease presents to the emergency part with chest pain. He had slight chest pain yesterday evening while at rest that he thought was indigestion. This then became more of a tightness and sharp pain in the left side of his chest that seemed similar to when he had an MI in November of last year. Patient's EKG does not show any ST elevations or depressions. He took nitroglycerin at home without  improvement. His pain has subsided on its own without intervention. He is now pain-free. Initial troponin was negative. Reviewing his records reveals that he required multiple areas of blockage on his cardiac catheterization, had PCI of the LAD at that time. Patient tells me that he is experiencing increased dyspnea with exertion over the last weeks or months which is unusual for him. Based on these symptoms and his known coronary artery disease, will require hospitalization for further cardiac evaluation.    Gilda Crease, MD 12/29/15 [phone]

## 2015-12-29 NOTE — Progress Notes (Signed)
ANTICOAGULATION CONSULT NOTE - Initial Consult  Pharmacy Consult for heparin/warfarin Indication: mural apical thrombus  Allergies  Allergen Reactions  . Coconut Fatty Acids     unknown   Patient Measurements: Height: 5\' 10"  (177.8 cm) Weight: 220 lb 0.3 oz (99.8 kg) IBW/kg (Calculated) : 73 Heparin Dosing Weight: 93.8 kg  Vital Signs: Temp: 98 F (36.7 C) (05/19 1425) Temp Source: Oral (05/19 1425) BP: 102/56 mmHg (05/19 1425) Pulse Rate: 61 (05/19 1425)  Labs:  Recent Labs  12/29/15 0350 12/29/15 0627 12/29/15 1217 12/29/15 1827  HGB 13.7  --   --   --   HCT 39.0  --   --   --   PLT 301  --   --   --   APTT 38*  --   --   --   LABPROT 13.3  --   --   --   INR 1.03  --   --   --   HEPARINUNFRC  --   --   --  0.57  CREATININE 1.08  --   --   --   TROPONINI <0.03 <0.03 <0.03 <0.03   Estimated Creatinine Clearance: 90.4 mL/min (by C-G formula based on Cr of 1.08).  Medical History: Past Medical History  Diagnosis Date  . Anxiety   . Depression   . Post traumatic stress disorder   . Elevated cholesterol   . STEMI (ST elevation myocardial infarction) (HCC)   . Diverticulitis   . Apical mural thrombus (HCC)   . Essential hypertension   . BPH (benign prostatic hyperplasia)    Medications:  Prescriptions prior to admission  Medication Sig Dispense Refill Last Dose  . acetaminophen (TYLENOL) 500 MG tablet Take 500 mg by mouth every 6 (six) hours as needed.   prn  . aspirin 81 MG EC tablet Take 1 tablet (81 mg total) by mouth daily. 30 tablet 3 12/29/2015 at Unknown time  . atorvastatin (LIPITOR) 80 MG tablet Take 1 tablet (80 mg total) by mouth daily at 6 PM. 30 tablet 3 12/28/2015 at Unknown time  . buPROPion (WELLBUTRIN SR) 150 MG 12 hr tablet Take 1 tablet (150 mg total) by mouth 2 (two) times daily. 60 tablet 2 12/28/2015 at Unknown time  . cyclobenzaprine (FLEXERIL) 10 MG tablet TAKE 1 TABLET BY MOUTH 3 TIMES DAILY AS NEEDED FOR MUSCLE SPASMS. 30 tablet 0 Past  Week at Unknown time  . famotidine (PEPCID) 20 MG tablet Take 1 tablet (20 mg total) by mouth 2 (two) times daily. 60 tablet 3 12/28/2015 at Unknown time  . fluticasone (FLONASE) 50 MCG/ACT nasal spray PLACE 2 SPRAYS INTO BOTH NOSTRILS DAILY. 16 g 2 Past Week at Unknown time  . hydrOXYzine (ATARAX/VISTARIL) 25 MG tablet Take 1 tablet (25 mg total) by mouth 3 (three) times daily as needed. 90 tablet 1 Past Week at Unknown time  . lisinopril (PRINIVIL,ZESTRIL) 2.5 MG tablet Take 1 tablet (2.5 mg total) by mouth at bedtime. 30 tablet 3 12/28/2015 at Unknown time  . metoprolol tartrate (LOPRESSOR) 25 MG tablet Take 1 tablet (25 mg total) by mouth 2 (two) times daily. 60 tablet 3 12/28/2015 at 2100  . NITROSTAT 0.4 MG SL tablet PLACE 1 TABLET UNDER THE TONGUE EVERY 5 MINUTES AS NEEDED FOR CHEST PAIN. MAX-3 DOSES WITHIN A 15 MINUTE INTERVAL 25 tablet 1 12/29/2015 at Unknown time  . sertraline (ZOLOFT) 50 MG tablet TAKE 1 TABLET BY MOUTH DAILY 30 tablet 3 12/28/2015 at Unknown time  .  tamsulosin (FLOMAX) 0.4 MG CAPS capsule Take 1 capsule (0.4 mg total) by mouth daily. 30 capsule 3 12/28/2015 at Unknown time  . ticagrelor (BRILINTA) 90 MG TABS tablet Take 1 tablet (90 mg total) by mouth 2 (two) times daily. 48 tablet 0 12/28/2015 at Unknown time   Assessment: Pharmacy is consulted to dose heparin and warfarin in 55 yo male diagnosed with mural apical thrombus s/p post anterior STEMI in 06/2015 on a 2D echo on 12/26/15. Pt not noted to be on any anticoagulation prior to admission. Pt is on dual antiplatelet therapy with aspirin 81 mg PO daily and ticagrelor 90 mg PO BID.  Baseline Labs:   INR 1.03, PT 13.2  Hgb, plt WNL  Scr 1.08   LFT, albumin WNL  12/29/2015   Heparin 4800 unit IV bolus x1 , then heparin 1500 units/hr  1st Heparin level at 1800 = 0.57 units/ml, in desired range  Goal of Therapy:  INR 2-3  Anti-Xa level 0.3-0.7 Monitor platelets by anticoagulation protocol: Yes   Plan:    Warfarin 10 mg PO x1   Continue Heparin 1500 units/hr  2nd Heparin level in 6 hr  Daily Heparin levels, PT/INR, CBC  Monitor for signs/symptoms of bleeding  Otho Bellows PharmD Pager [phone] 12/29/2015, 7:32 PM

## 2015-12-29 NOTE — ED Notes (Signed)
Pt has hx of heart problems and most recent he thinks they saw a blood clot, pt looks grey in triage and is very fidgety, pt is short of breath and diaphoretic

## 2015-12-29 NOTE — H&P (Addendum)
History and Physical    [name]:[id number] DOB: 04-01-1961 DOA: 12/29/2015  Referring MD/NP/PA:   PCP: Jaclyn Shaggy, MD   Patient coming from:  The patient is coming from home.  At his baseline, he is independent for his ADL.       Chief Complaint: chest pain  HPI: Henry David is a 55 y.o. male with medical history significant of STEMI (s/p of DES), hyperlipidemia, GERD, depression, anxiety, diverticulitis, who presents with chest pain.  Patient reports that he started having chest pain at about 10:30 PM. The chest pain is located in the left side of the chest, moderate, constant, pressure like, non-radiating. Patient does not have shortness of breath, cough, fever or chills. No nausea, vomiting, abdominal pain, diarrhea. Denies symptoms of UTI. He states that he had one episode of transient numbness over left upper thigh, which has completely resolved. Currently no unilateral weakness, numbness, tingling sensations in extremities. No vision change or hearing loss. Of note, he was found to have apical thrombus on 2D echo by Dr. Jens Som (Reader of the day) on 12/26/15, Dr. Tresa Endo was notified. Pt was treated with nitroglycerin and aspirin in the emergency room, currently he is chest pain-free.  ED Course: pt was found to have negative troponin, WBC 8.1, temperature normal, no tachycardia, electrolytes and renal function okay, negative chest x-ray. Patient is placed on telemetry bed for observation.  Review of Systems:   General: no fevers, chills, no changes in body weight, has fatigue HEENT: no blurry vision, hearing changes or sore throat Pulm: no dyspnea, coughing, wheezing CV: had chest pain, no palpitations Abd: no nausea, vomiting, abdominal pain, diarrhea, constipation GU: no dysuria, burning on urination, increased urinary frequency, hematuria  Ext: no leg edema Neuro: no unilateral weakness, numbness, or tingling, no vision change or hearing loss Skin: no  rash MSK: No muscle spasm, no deformity, no limitation of range of movement in spin Heme: No easy bruising.  Travel history: No recent long distant travel.  Allergy:  Allergies  Allergen Reactions  . Coconut Fatty Acids     unknown    Past Medical History  Diagnosis Date  . Anxiety   . Depression   . Post traumatic stress disorder   . Elevated cholesterol   . STEMI (ST elevation myocardial infarction) (HCC)   . Diverticulitis   . Apical mural thrombus (HCC)   . Essential hypertension   . BPH (benign prostatic hyperplasia)     Past Surgical History  Procedure Laterality Date  . Knee surgery    . Cardiac catheterization N/A 07/09/2015    Procedure: Left Heart Cath and Coronary Angiography;  Surgeon: Lennette Bihari, MD;  Location: [facility] INVASIVE CV LAB;  Service: Cardiovascular;  Laterality: N/A;  . Cardiac catheterization N/A 07/09/2015    Procedure: Coronary Stent Intervention;  Surgeon: Lennette Bihari, MD;  Location: [facility] INVASIVE CV LAB;  Service: Cardiovascular;  Laterality: N/A;  . Cardiac catheterization N/A 07/11/2015    Procedure: Coronary Stent Intervention;  Surgeon: Lennette Bihari, MD;  Location: [facility] INVASIVE CV LAB;  Service: Cardiovascular;  Laterality: N/A;    Social History:  reports that he quit smoking about 5 months ago. His smoking use included Cigarettes. He smoked 0.25 packs per day. He does not have any smokeless tobacco history on file. He reports that he drinks about 1.8 oz of alcohol per week. He reports that he does not use illicit drugs.  Family History:  Family History  Problem Relation  Age of Onset  . Heart disease Mother   . Heart disease Father      Prior to Admission medications   Medication Sig Start Date End Date Taking? Authorizing Provider  acetaminophen (TYLENOL) 500 MG tablet Take 500 mg by mouth every 6 (six) hours as needed.   Yes Historical Provider, MD  aspirin 81 MG EC tablet Take 1 tablet (81 mg total) by mouth daily. 10/04/15  Yes  Jaclyn Shaggy, MD  atorvastatin (LIPITOR) 80 MG tablet Take 1 tablet (80 mg total) by mouth daily at 6 PM. 10/04/15  Yes Jaclyn Shaggy, MD  buPROPion (WELLBUTRIN SR) 150 MG 12 hr tablet Take 1 tablet (150 mg total) by mouth 2 (two) times daily. 10/04/15  Yes Jaclyn Shaggy, MD  cyclobenzaprine (FLEXERIL) 10 MG tablet TAKE 1 TABLET BY MOUTH 3 TIMES DAILY AS NEEDED FOR MUSCLE SPASMS. 12/26/15  Yes Jaclyn Shaggy, MD  famotidine (PEPCID) 20 MG tablet Take 1 tablet (20 mg total) by mouth 2 (two) times daily. 10/04/15  Yes Jaclyn Shaggy, MD  fluticasone (FLONASE) 50 MCG/ACT nasal spray PLACE 2 SPRAYS INTO BOTH NOSTRILS DAILY. 10/30/15  Yes Jaclyn Shaggy, MD  hydrOXYzine (ATARAX/VISTARIL) 25 MG tablet Take 1 tablet (25 mg total) by mouth 3 (three) times daily as needed. 10/04/15  Yes Jaclyn Shaggy, MD  lisinopril (PRINIVIL,ZESTRIL) 2.5 MG tablet Take 1 tablet (2.5 mg total) by mouth at bedtime. 10/05/15  Yes Lennette Bihari, MD  metoprolol tartrate (LOPRESSOR) 25 MG tablet Take 1 tablet (25 mg total) by mouth 2 (two) times daily. 10/04/15  Yes Jaclyn Shaggy, MD  NITROSTAT 0.4 MG SL tablet PLACE 1 TABLET UNDER THE TONGUE EVERY 5 MINUTES AS NEEDED FOR CHEST PAIN. MAX-3 DOSES WITHIN A 15 MINUTE INTERVAL 12/26/15  Yes Jaclyn Shaggy, MD  sertraline (ZOLOFT) 50 MG tablet TAKE 1 TABLET BY MOUTH DAILY 12/26/15  Yes Jaclyn Shaggy, MD  tamsulosin (FLOMAX) 0.4 MG CAPS capsule Take 1 capsule (0.4 mg total) by mouth daily. 10/04/15  Yes Jaclyn Shaggy, MD  ticagrelor (BRILINTA) 90 MG TABS tablet Take 1 tablet (90 mg total) by mouth 2 (two) times daily. 09/27/15  Yes Lennette Bihari, MD    Physical Exam: Filed Vitals:   12/29/15 0335 12/29/15 0400 12/29/15 0500 12/29/15 0600  BP: 120/79 102/64 100/71 105/74  Pulse: 77 65 62 66  Temp: 98.1 F (36.7 C)     TempSrc: Oral     Resp: 20 19 16 21   Height: 5\' 10"  (1.778 m)     Weight: 97.523 kg (215 lb)     SpO2: 99% 95% 96% 99%   General: Not in acute distress HEENT:       Eyes: PERRL,  EOMI, no scleral icterus.       ENT: No discharge from the ears and nose, no pharynx injection, no tonsillar enlargement.        Neck: No JVD, no bruit, no mass felt. Heme: No neck lymph node enlargement. Cardiac: S1/S2, RRR, No murmurs, No gallops or rubs. Pulm: No rales, wheezing, rhonchi or rubs. Abd: Soft, nondistended, nontender, no rebound pain, no organomegaly, BS present. GU: No hematuria Ext: No pitting leg edema bilaterally. 2+DP/PT pulse bilaterally. Musculoskeletal: No joint deformities, No joint redness or warmth, no limitation of ROM in spin. Skin: No rashes.  Neuro: Alert, oriented X3, cranial nerves II-XII grossly intact, moves all extremities normally. Muscle strength 5/5 in all extremities, sensation to light touch intact. Knee reflex 1+ bilaterally. Negative Babinski's sign. Normal finger to  nose test. Psych: Patient is not psychotic, no suicidal or hemocidal ideation.  Labs on Admission: I have personally reviewed following labs and imaging studies  CBC:  Recent Labs Lab 12/29/15 0350  WBC 8.7  NEUTROABS 4.4  HGB 13.7  HCT 39.0  MCV 83.0  PLT 301   Basic Metabolic Panel:  Recent Labs Lab 12/29/15 0350  NA 137  K 4.0  CL 106  CO2 24  GLUCOSE 105*  BUN 21*  CREATININE 1.08  CALCIUM 9.1   GFR: Estimated Creatinine Clearance: 89.4 mL/min (by C-G formula based on Cr of 1.08). Liver Function Tests:  Recent Labs Lab 12/29/15 0350  AST 25  ALT 33  ALKPHOS 87  BILITOT 0.8  PROT 7.8  ALBUMIN 4.4   No results for input(s): LIPASE, AMYLASE in the last 168 hours. No results for input(s): AMMONIA in the last 168 hours. Coagulation Profile:  Recent Labs Lab 12/29/15 0350  INR 1.03   Cardiac Enzymes:  Recent Labs Lab 12/29/15 0350  TROPONINI <0.03   BNP (last 3 results) No results for input(s): PROBNP in the last 8760 hours. HbA1C: No results for input(s): HGBA1C in the last 72 hours. CBG: No results for input(s): GLUCAP in the last 168  hours. Lipid Profile: No results for input(s): CHOL, HDL, LDLCALC, TRIG, CHOLHDL, LDLDIRECT in the last 72 hours. Thyroid Function Tests: No results for input(s): TSH, T4TOTAL, FREET4, T3FREE, THYROIDAB in the last 72 hours. Anemia Panel: No results for input(s): VITAMINB12, FOLATE, FERRITIN, TIBC, IRON, RETICCTPCT in the last 72 hours. Urine analysis:    Component Value Date/Time   COLORURINE YELLOW 06/20/2008 1444   APPEARANCEUR Clear 06/20/2008 1444   LABSPEC 1.020 06/20/2008 1444   PHURINE 5.5 06/20/2008 1444   GLUCOSEU NEGATIVE 06/20/2008 1444   BILIRUBINUR NEGATIVE 06/20/2008 1444   KETONESUR NEGATIVE 06/20/2008 1444   UROBILINOGEN 0.2 mg/dL 16/05/9603 5409   NITRITE Negative 06/20/2008 1444   LEUKOCYTESUR Negative 06/20/2008 1444   Sepsis Labs: @LABRCNTIP (procalcitonin:4,lacticidven:4) )No results found for this or any previous visit (from the past 240 hour(s)).   Radiological Exams on Admission: Dg Chest 2 View  12/29/2015  CLINICAL DATA:  Chest pain, onset this morning. Anxious with tachycardia. History of heart disease. Previous smoker. EXAM: CHEST  2 VIEW COMPARISON:  07/09/2015 FINDINGS: The heart size and mediastinal contours are within normal limits. Both lungs are clear. The visualized skeletal structures are unremarkable. IMPRESSION: No active cardiopulmonary disease. Electronically Signed   By: Burman Nieves M.D.   On: 12/29/2015 04:41     EKG: Independently reviewed. Sinus rhythm, QTC 420, low voltage, anteroseptal infarction pattern  Assessment/Plan Principal Problem:   Chest pain Active Problems:   Depression with anxiety   GERD   STEMI 07/09/15   Hyperlipidemia with target LDL less than 70   CAD S/P LAD and RCA DES   Presence of drug coated stent in LAD coronary artery   Obesity   Apical mural thrombus (HCC)   Essential hypertension   BPH (benign prostatic hyperplasia)   Chest pain and hx of STEMI: s/p of DES. Currently on aspirin and Brilinta.  His chest has resolved. Patient does not have shortness of breath or any signs of DVT, less likely to have pulmonary embolism. No infiltration on chest x-ray, no fever or leukocytosis, unlikely to have pneumonia. Given his significant risk factors, will admit for chest pain rule out.  - will place on tele bed for obs - cycle CE q6 x3 and repeat her EKG in  the am  - Nitroglycerin, Morphine, and aspirin, lipitor,metoprolol - Risk factor stratification: will check FLP and A1C  - will not repeat 2d echo since he just had on on 12/26/15, which showed EF of 40-45 percent with grade 1 diastolic dysfunction. He was also found to have apical thrombus. - please call Card in AM  Apical mural thrombus [facility]): - pt may need anticoagulant, I would like to follow-up cardiologist recommendation -Patient's on Brilinta now  GERD: -Pepcid  HLD: Last LDL was 63 on 09/27/15  -Continue home medications:   Depression and anxiety: Stable, no suicidal or homicidal ideations. -Continue home medications: Wellbutrin and when necessary hydroxyzine for anxious  Hypertension: -Continue metoprolol and lisinopril  BPH: stable - Continue Flomax   DVT ppx: SQ Heparin (if pt develops severe chest pain or significantly elevated trop, will be easier to switch to IV heparin or stop heparin for procedure than using Lovenox).  Code Status: Full code Family Communication: None at bed side. Disposition Plan:  Anticipate discharge back to previous home environment Consults called: not yet Admission status: Obs / tele  Date of Service 12/29/2015    Lorretta Harp [facility] Pager [phone]  If 7PM-7AM, please contact night-coverage [email or web address] Password [facility] 12/29/2015, 6:51 AM

## 2015-12-29 NOTE — Progress Notes (Signed)
Pt seen and examined, 54/M with recent STEMI 20m ago, admitted this am with atypical chest pain, EKG and troponins negative? Anxiety, also had small apical thrombus detected on ECHO 5/16, needs anticoagulation Will ask Cards to see  Zannie Cove, MD

## 2015-12-30 ENCOUNTER — Other Ambulatory Visit: Payer: Self-pay

## 2015-12-30 DIAGNOSIS — R079 Chest pain, unspecified: Secondary | ICD-10-CM

## 2015-12-30 LAB — BASIC METABOLIC PANEL
ANION GAP: 8 (ref 5–15)
BUN: 19 mg/dL (ref 6–20)
CHLORIDE: 105 mmol/L (ref 101–111)
CO2: 24 mmol/L (ref 22–32)
Calcium: 9 mg/dL (ref 8.9–10.3)
Creatinine, Ser: 0.97 mg/dL (ref 0.61–1.24)
GFR calc non Af Amer: >60 mL/min (ref >60)
GLUCOSE: 110 mg/dL — AB (ref 65–99)
POTASSIUM: 3.9 mmol/L (ref 3.5–5.1)
Sodium: 137 mmol/L (ref 135–145)

## 2015-12-30 LAB — HEPARIN LEVEL (UNFRACTIONATED)
HEPARIN UNFRACTIONATED: 0.63 [IU]/mL (ref 0.30–0.70)
Heparin Unfractionated: 0.56 IU/mL (ref 0.30–0.70)

## 2015-12-30 LAB — CBC
HEMATOCRIT: 38.3 % — AB (ref 39.0–52.0)
HEMOGLOBIN: 13.7 g/dL (ref 13.0–17.0)
MCH: 30.4 pg (ref 26.0–34.0)
MCHC: 35.8 g/dL (ref 30.0–36.0)
MCV: 85.1 fL (ref 78.0–100.0)
Platelets: 254 10*3/uL (ref 150–400)
RBC: 4.5 MIL/uL (ref 4.22–5.81)
RDW: 13.4 % (ref 11.5–15.5)
WBC: 8 10*3/uL (ref 4.0–10.5)

## 2015-12-30 LAB — BRAIN NATRIURETIC PEPTIDE: B Natriuretic Peptide: 42.5 pg/mL (ref 0.0–100.0)

## 2015-12-30 LAB — PROTIME-INR
INR: 1.12 (ref 0.00–1.49)
PROTHROMBIN TIME: 14.1 s (ref 11.6–15.2)

## 2015-12-30 LAB — HEMOGLOBIN A1C
HEMOGLOBIN A1C: 6.6 % — AB (ref 4.8–5.6)
Mean Plasma Glucose: 143 mg/dL

## 2015-12-30 MED ORDER — WARFARIN SODIUM 5 MG PO TABS
10.0000 mg | ORAL_TABLET | Freq: Once | ORAL | Status: AC
Start: 2015-12-30 — End: 2015-12-30
  Administered 2015-12-30: 10 mg via ORAL
  Filled 2015-12-30: qty 2

## 2015-12-30 NOTE — Progress Notes (Signed)
ANTICOAGULATION CONSULT NOTE - Initial Consult  Pharmacy Consult for heparin/warfarin Indication: mural apical thrombus  Allergies  Allergen Reactions  . Coconut Fatty Acids     unknown   Patient Measurements: Height: 5\' 10"  (177.8 cm) Weight: 220 lb 0.3 oz (99.8 kg) IBW/kg (Calculated) : 73 Heparin Dosing Weight: 93.8 kg  Vital Signs: Temp: 98.2 F (36.8 C) (05/20 0513) Temp Source: Oral (05/20 0513) BP: 106/64 mmHg (05/20 0513) Pulse Rate: 60 (05/20 0513)  Labs:  Recent Labs  12/29/15 0350 12/29/15 0627 12/29/15 1217 12/29/15 1827 12/30/15 0003 12/30/15 0453  HGB 13.7  --   --   --   --  13.7  HCT 39.0  --   --   --   --  38.3*  PLT 301  --   --   --   --  254  APTT 38*  --   --   --   --   --   LABPROT 13.3  --   --   --   --  14.1  INR 1.03  --   --   --   --  1.12  HEPARINUNFRC  --   --   --  0.57 0.56 0.63  CREATININE 1.08  --   --   --   --  0.97  TROPONINI <0.03 <0.03 <0.03 <0.03  --   --    Estimated Creatinine Clearance: 100.7 mL/min (by C-G formula based on Cr of 0.97).  Medical History: Past Medical History  Diagnosis Date  . Anxiety   . Depression   . Post traumatic stress disorder   . Elevated cholesterol   . STEMI (ST elevation myocardial infarction) (HCC)   . Diverticulitis   . Apical mural thrombus (HCC)   . Essential hypertension   . BPH (benign prostatic hyperplasia)    Medications:  Prescriptions prior to admission  Medication Sig Dispense Refill Last Dose  . acetaminophen (TYLENOL) 500 MG tablet Take 500 mg by mouth every 6 (six) hours as needed.   prn  . aspirin 81 MG EC tablet Take 1 tablet (81 mg total) by mouth daily. 30 tablet 3 12/29/2015 at Unknown time  . atorvastatin (LIPITOR) 80 MG tablet Take 1 tablet (80 mg total) by mouth daily at 6 PM. 30 tablet 3 12/28/2015 at Unknown time  . buPROPion (WELLBUTRIN SR) 150 MG 12 hr tablet Take 1 tablet (150 mg total) by mouth 2 (two) times daily. 60 tablet 2 12/28/2015 at Unknown time  .  cyclobenzaprine (FLEXERIL) 10 MG tablet TAKE 1 TABLET BY MOUTH 3 TIMES DAILY AS NEEDED FOR MUSCLE SPASMS. 30 tablet 0 Past Week at Unknown time  . famotidine (PEPCID) 20 MG tablet Take 1 tablet (20 mg total) by mouth 2 (two) times daily. 60 tablet 3 12/28/2015 at Unknown time  . fluticasone (FLONASE) 50 MCG/ACT nasal spray PLACE 2 SPRAYS INTO BOTH NOSTRILS DAILY. 16 g 2 Past Week at Unknown time  . hydrOXYzine (ATARAX/VISTARIL) 25 MG tablet Take 1 tablet (25 mg total) by mouth 3 (three) times daily as needed. 90 tablet 1 Past Week at Unknown time  . lisinopril (PRINIVIL,ZESTRIL) 2.5 MG tablet Take 1 tablet (2.5 mg total) by mouth at bedtime. 30 tablet 3 12/28/2015 at Unknown time  . metoprolol tartrate (LOPRESSOR) 25 MG tablet Take 1 tablet (25 mg total) by mouth 2 (two) times daily. 60 tablet 3 12/28/2015 at 2100  . NITROSTAT 0.4 MG SL tablet PLACE 1 TABLET UNDER THE TONGUE EVERY 5  MINUTES AS NEEDED FOR CHEST PAIN. MAX-3 DOSES WITHIN A 15 MINUTE INTERVAL 25 tablet 1 12/29/2015 at Unknown time  . sertraline (ZOLOFT) 50 MG tablet TAKE 1 TABLET BY MOUTH DAILY 30 tablet 3 12/28/2015 at Unknown time  . tamsulosin (FLOMAX) 0.4 MG CAPS capsule Take 1 capsule (0.4 mg total) by mouth daily. 30 capsule 3 12/28/2015 at Unknown time  . ticagrelor (BRILINTA) 90 MG TABS tablet Take 1 tablet (90 mg total) by mouth 2 (two) times daily. 48 tablet 0 12/28/2015 at Unknown time   Assessment: Pharmacy is consulted to dose heparin and warfarin in 55 yo male diagnosed with mural apical thrombus s/p post anterior STEMI in 06/2015 on a 2D echo on 12/26/15. Pt not noted to be on any anticoagulation prior to admission. Pt is on dual antiplatelet therapy with aspirin 81 mg PO daily and ticagrelor 90 mg PO BID.  Pt warfarin education completed on 5/19.   Baseline Labs:   INR 1.03, PT 13.2  Hgb, plt WNL  Scr 1.08   LFT, albumin WNL  12/30/2015   Pt has been on heparin 1500 units/hr  Heparin level this AM was 0.63, within  the target range of 0.3 to 0.7  Pt has been started on heart healthy diet  No bleeding issues reported by RN  Goal of Therapy:  INR 2-3  Anti-Xa level 0.3-0.7 Monitor platelets by anticoagulation protocol: Yes   Plan:   Per cardiology, continue heparin until INR 2.2 or above   Warfarin 10 mg PO x1   Continue Heparin 1500 units/hr  Daily Heparin levels, PT/INR, CBC  Monitor for signs/symptoms of bleeding  Adalberto Cole, PharmD, BCPS Pager [phone] 12/30/2015 8:33 AM

## 2015-12-30 NOTE — Progress Notes (Signed)
PHARMACY - HEPARIN (brief note)  Patient started on IV heparin/warfarin for mural apical thrombus.  IV heparin currently infusing @ 1500 units/hr  Repeat heparin level = 0.56 (Goal 0.3-0.7) No complications of therapy noted  Plan:  Continue IV heparin @ 1500 units/hr            Follow [facility]            F/U AM INR  Terrilee Files, PharmD 12/30/15 @ 01:20

## 2015-12-30 NOTE — Progress Notes (Signed)
Patient ID: Henry David, male   DOB: 1961/04/24, 55 y.o.   MRN: [id number]    Subjective:  Denies SSCP, palpitations or Dyspnea   Objective:  Filed Vitals:   12/29/15 0754 12/29/15 1425 12/29/15 2111 12/30/15 0513  BP: 100/65 102/56 108/56 106/64  Pulse: 64 61 62 60  Temp: 98.1 F (36.7 C) 98 F (36.7 C) 99 F (37.2 C) 98.2 F (36.8 C)  TempSrc: Oral Oral Oral Oral  Resp: 16 16 16 18   Height: 5\' 10"  (1.778 m)     Weight: 99.8 kg (220 lb 0.3 oz)     SpO2: 100% 100% 100% 100%    Intake/Output from previous day:  Intake/Output Summary (Last 24 hours) at 12/30/15 0818 Last data filed at 12/30/15 0304  Gross per 24 hour  Intake    616 ml  Output   1450 ml  Net   -834 ml    Physical Exam: BP 106/64 mmHg  Pulse 60  Temp(Src) 98.2 F (36.8 C) (Oral)  Resp 18  Ht 5\' 10"  (1.778 m)  Wt 99.8 kg (220 lb 0.3 oz)  BMI 31.57 kg/m2  SpO2 100% Affect appropriate Healthy:  appears stated age HEENT: normal Neck supple with no adenopathy JVP normal no bruits no thyromegaly Lungs clear with no wheezing and good diaphragmatic motion Heart:  S1/S2 no murmur, no rub, gallop or click PMI normal Abdomen: benighn, BS positve, no tenderness, no AAA no bruit.  No HSM or HJR Distal pulses intact with no bruits No edema Neuro non-focal Skin warm and dry No muscular weakness  Lab Results: Basic Metabolic Panel:  Recent Labs  31/43/88 0350 12/30/15 0453  NA 137 137  K 4.0 3.9  CL 106 105  CO2 24 24  GLUCOSE 105* 110*  BUN 21* 19  CREATININE 1.08 0.97  CALCIUM 9.1 9.0   Liver Function Tests:  Recent Labs  12/29/15 0350  AST 25  ALT 33  ALKPHOS 87  BILITOT 0.8  PROT 7.8  ALBUMIN 4.4   CBC:  Recent Labs  12/29/15 0350 12/30/15 0453  WBC 8.7 8.0  NEUTROABS 4.4  --   HGB 13.7 13.7  HCT 39.0 38.3*  MCV 83.0 85.1  PLT 301 254   Cardiac Enzymes:  Recent Labs  12/29/15 0627 12/29/15 1217 12/29/15 1827  TROPONINI <0.03 <0.03 <0.03   BNP: Invalid  input(s): POCBNP D-Dimer: No results for input(s): DDIMER in the last 72 hours. Hemoglobin A1C:  Recent Labs  12/29/15 0350  HGBA1C 6.6*   Fasting Lipid Panel:  Recent Labs  12/29/15 0350  CHOL 136  HDL 22*  LDLCALC UNABLE TO CALCULATE IF TRIGLYCERIDE OVER 400 mg/dL  TRIG 875*  CHOLHDL 6.2   Thyroid Function Tests: No results for input(s): TSH, T4TOTAL, T3FREE, THYROIDAB in the last 72 hours.  Invalid input(s): FREET3 Anemia Panel: No results for input(s): VITAMINB12, FOLATE, FERRITIN, TIBC, IRON, RETICCTPCT in the last 72 hours.  Imaging: Dg Chest 2 View  12/29/2015  CLINICAL DATA:  Chest pain, onset this morning. Anxious with tachycardia. History of heart disease. Previous smoker. EXAM: CHEST  2 VIEW COMPARISON:  07/09/2015 FINDINGS: The heart size and mediastinal contours are within normal limits. Both lungs are clear. The visualized skeletal structures are unremarkable. IMPRESSION: No active cardiopulmonary disease. Electronically Signed   By: Burman Nieves M.D.   On: 12/29/2015 04:41    Cardiac Studies:  ECG:  SR anterolateral MI no acute changes    Telemetry:  NSR no arrhythmia  Echo: 5/16 EF 40-45% apical thrombus images reviewed with definity   Medications:   . aspirin EC  81 mg Oral Daily  . atorvastatin  80 mg Oral q1800  . buPROPion  150 mg Oral BID  . famotidine  20 mg Oral BID  . fluticasone  1 spray Each Nare Daily  . lisinopril  2.5 mg Oral QHS  . metoprolol tartrate  25 mg Oral BID  . sertraline  50 mg Oral Daily  . tamsulosin  0.4 mg Oral Daily  . ticagrelor  90 mg Oral BID  . Warfarin - Pharmacist Dosing Inpatient   Does not apply q1800     . heparin 1,500 Units/hr (12/29/15 2342)    Assessment/Plan:  Chest Pain: 6 months post DES to LAD and RCA with POB of diagonal. Compliant with DAT. Enzymes negative no ECG changes. Atypical and now resolved Do not think cath or myovue currently indicated Continue medical RX DAT and beta blocker Has  R/O   CHF: EF 40-45% euvolemic CXR clear continue ACE no indication for AICD  Thrombus: Post anterior MI His agoraphobia and anxiety preclude further delineation with MRI Continue heparin until INR 2.2 or above and can be d/c home  Outpatient f/u Dr Dutch Quint 12/30/2015, 8:18 AM

## 2015-12-30 NOTE — Progress Notes (Addendum)
PROGRESS NOTE    Henry David  EAV:[id number] DOB: May 22, 1961 DOA: 12/29/2015 PCP: Jaclyn Shaggy, MD  Brief Narrative: Henry David is a 55 y.o. male with medical history significant of STEMI (s/p of DES), hyperlipidemia, GERD, depression, anxiety, diverticulitis, presented with chest pain, located in the left side of the chest, moderate, constant, pressure like, non-radiating  Assessment & Plan:   Chest pain  -atypical, likely anxiety induced, resolved -EKG unchanged and troponins negative -H/o CAD with STEMI 7months ago with DES to LAD and RCA with POB of diagonal -continue ASA/Brillinta, metop, statin -appreciate Cards input  Apical mural thrombus (HCC): -started on Warfarin with Heparin per Cards -per Pharmacy  GERD: -Pepcid  HLD: Last LDL was 63 on 09/27/15  -Continue home medications:   Depression and anxiety: Stable, no suicidal or homicidal ideations. -Continue  Wellbutrin and xanax  Hypertension: -Continue metoprolol and lisinopril  BPH: stable - Continue Flomax   DVT ppx: on IV heparin Code Status: Full code Family Communication: None at bed side. Disposition Plan: Home when INR >2  Consultants:   Cards   Subjective: Feels better, well rested, xanax helped a lot  Objective: Filed Vitals:   12/29/15 1425 12/29/15 2111 12/30/15 0513 12/30/15 1057  BP: 102/56 108/56 106/64   Pulse: 61 62 60 68  Temp: 98 F (36.7 C) 99 F (37.2 C) 98.2 F (36.8 C)   TempSrc: Oral Oral Oral   Resp: Height:      Weight:      SpO2: 100% 100% 100%     Intake/Output Summary (Last 24 hours) at 12/30/15 1151 Last data filed at 12/30/15 1113  Gross per 24 hour  Intake    616 ml  Output   1650 ml  Net  -1034 ml   Filed Weights   12/29/15 0335 12/29/15 0754  Weight: 97.523 kg (215 lb) 99.8 kg (220 lb 0.3 oz)    Examination:  General exam: Appears calm and comfortable, no distress  Respiratory system: Clear to auscultation. Respiratory  effort normal. Cardiovascular system: S1 & S2 heard, RRR. No JVD, murmurs, rubs, gallops or clicks. No pedal edema. Gastrointestinal system: Abdomen is nondistended, soft and nontender. No organomegaly or masses felt. Normal bowel sounds heard. Central nervous system: Alert and oriented. No focal neurological deficits. Extremities: Symmetric 5 x 5 power. Skin: No rashes, lesions or ulcers Psychiatry: Judgement and insight appear normal. Mood & affect appropriate.     Data Reviewed: I have personally reviewed following labs and imaging studies  CBC:  Recent Labs Lab 12/29/15 0350 12/30/15 0453  WBC 8.7 8.0  NEUTROABS 4.4  --   HGB 13.7 13.7  HCT 39.0 38.3*  MCV 83.0 85.1  PLT 301 254   Basic Metabolic Panel:  Recent Labs Lab 12/29/15 0350 12/30/15 0453  NA 137 137  K 4.0 3.9  CL 106 105  CO2 24 24  GLUCOSE 105* 110*  BUN 21* 19  CREATININE 1.08 0.97  CALCIUM 9.1 9.0   GFR: Estimated Creatinine Clearance: 100.7 mL/min (by C-G formula based on Cr of 0.97). Liver Function Tests:  Recent Labs Lab 12/29/15 0350  AST 25  ALT 33  ALKPHOS 87  BILITOT 0.8  PROT 7.8  ALBUMIN 4.4   No results for input(s): LIPASE, AMYLASE in the last 168 hours. No results for input(s): AMMONIA in the last 168 hours. Coagulation Profile:  Recent Labs Lab 12/29/15 0350 12/30/15 0453  INR 1.03 1.12   Cardiac Enzymes:  Recent Labs Lab 12/29/15 0350 12/29/15 0627 12/29/15 1217 12/29/15 1827  TROPONINI <0.03 <0.03 <0.03 <0.03   BNP (last 3 results) No results for input(s): PROBNP in the last 8760 hours. HbA1C:  Recent Labs  12/29/15 0350  HGBA1C 6.6*   CBG: No results for input(s): GLUCAP in the last 168 hours. Lipid Profile:  Recent Labs  12/29/15 0350  CHOL 136  HDL 22*  LDLCALC UNABLE TO CALCULATE IF TRIGLYCERIDE OVER 400 mg/dL  TRIG 741*  CHOLHDL 6.2   Thyroid Function Tests: No results for input(s): TSH, T4TOTAL, FREET4, T3FREE, THYROIDAB in the last  72 hours. Anemia Panel: No results for input(s): VITAMINB12, FOLATE, FERRITIN, TIBC, IRON, RETICCTPCT in the last 72 hours. Urine analysis:    Component Value Date/Time   COLORURINE YELLOW 06/20/2008 1444   APPEARANCEUR Clear 06/20/2008 1444   LABSPEC 1.020 06/20/2008 1444   PHURINE 5.5 06/20/2008 1444   GLUCOSEU NEGATIVE 06/20/2008 1444   BILIRUBINUR NEGATIVE 06/20/2008 1444   KETONESUR NEGATIVE 06/20/2008 1444   UROBILINOGEN 0.2 mg/dL 28/78/6767 2094   NITRITE Negative 06/20/2008 1444   LEUKOCYTESUR Negative 06/20/2008 1444   Sepsis Labs: @LABRCNTIP (procalcitonin:4,lacticidven:4)  )No results found for this or any previous visit (from the past 240 hour(s)).       Radiology Studies: Dg Chest 2 View  12/29/2015  CLINICAL DATA:  Chest pain, onset this morning. Anxious with tachycardia. History of heart disease. Previous smoker. EXAM: CHEST  2 VIEW COMPARISON:  07/09/2015 FINDINGS: The heart size and mediastinal contours are within normal limits. Both lungs are clear. The visualized skeletal structures are unremarkable. IMPRESSION: No active cardiopulmonary disease. Electronically Signed   By: Burman Nieves M.D.   On: 12/29/2015 04:41        Scheduled Meds: . aspirin EC  81 mg Oral Daily  . atorvastatin  80 mg Oral q1800  . buPROPion  150 mg Oral BID  . famotidine  20 mg Oral BID  . fluticasone  1 spray Each Nare Daily  . lisinopril  2.5 mg Oral QHS  . metoprolol tartrate  25 mg Oral BID  . sertraline  50 mg Oral Daily  . tamsulosin  0.4 mg Oral Daily  . ticagrelor  90 mg Oral BID  . warfarin  10 mg Oral ONCE-1800  . Warfarin - Pharmacist Dosing Inpatient   Does not apply q1800   Continuous Infusions: . heparin 1,500 Units/hr (12/29/15 2342)        Time spent:    Zannie Cove, MD [facility] Pager [phone]  If 7PM-7AM, please contact night-coverage [email or web address] Password [email or web address] 12/30/2015, 11:51 AM

## 2015-12-31 LAB — PROTIME-INR
INR: 2.24 — ABNORMAL HIGH (ref 0.00–1.49)
Prothrombin Time: 23.8 seconds — ABNORMAL HIGH (ref 11.6–15.2)

## 2015-12-31 LAB — CBC
HCT: 38.6 % — ABNORMAL LOW (ref 39.0–52.0)
Hemoglobin: 13.3 g/dL (ref 13.0–17.0)
MCH: 28.7 pg (ref 26.0–34.0)
MCHC: 34.5 g/dL (ref 30.0–36.0)
MCV: 83.4 fL (ref 78.0–100.0)
PLATELETS: 269 10*3/uL (ref 150–400)
RBC: 4.63 MIL/uL (ref 4.22–5.81)
RDW: 13.3 % (ref 11.5–15.5)
WBC: 7.9 10*3/uL (ref 4.0–10.5)

## 2015-12-31 LAB — HEPARIN LEVEL (UNFRACTIONATED): Heparin Unfractionated: 0.54 IU/mL (ref 0.30–0.70)

## 2015-12-31 MED ORDER — ALPRAZOLAM 0.5 MG PO TABS: 0.5000 mg | ORAL_TABLET | Freq: Every day | ORAL | Status: DC | PRN

## 2015-12-31 MED ORDER — WARFARIN SODIUM 2.5 MG PO TABS
2.5000 mg | ORAL_TABLET | Freq: Once | ORAL | Status: AC
  Administered 2015-12-31: 2.5 mg via ORAL
  Filled 2015-12-31: qty 1

## 2015-12-31 MED ORDER — WARFARIN SODIUM 5 MG PO TABS: 5.0000 mg | ORAL_TABLET | Freq: Once | ORAL | Status: DC

## 2015-12-31 NOTE — Progress Notes (Signed)
Patient ID: Henry David, male   DOB: Mar 17, 1961, 55 y.o.   MRN: [id number]    Subjective:  Denies SSCP, palpitations or Dyspnea Ready for d/c    Objective:  Filed Vitals:   12/30/15 1057 12/30/15 1410 12/30/15 2125 12/31/15 0614  BP:  98/62 104/63 100/53  Pulse: 68 65 67 58  Temp:  97.9 F (36.6 C) 98.7 F (37.1 C) 98 F (36.7 C)  TempSrc:  Oral Oral Oral  Resp:  18 18 16   Height:      Weight:      SpO2:  97% 98% 97%    Intake/Output from previous day:  Intake/Output Summary (Last 24 hours) at 12/31/15 0906 Last data filed at 12/31/15 0617  Gross per 24 hour  Intake    599 ml  Output   2450 ml  Net  -1851 ml    Physical Exam: BP 100/53 mmHg  Pulse 58  Temp(Src) 98 F (36.7 C) (Oral)  Resp 16  Ht 5\' 10"  (1.778 m)  Wt 99.8 kg (220 lb 0.3 oz)  BMI 31.57 kg/m2  SpO2 97% Affect appropriate Healthy:  appears stated age HEENT: normal Neck supple with no adenopathy JVP normal no bruits no thyromegaly Lungs clear with no wheezing and good diaphragmatic motion Heart:  S1/S2 no murmur, no rub, gallop or click PMI normal Abdomen: benighn, BS positve, no tenderness, no AAA no bruit.  No HSM or HJR Distal pulses intact with no bruits No edema Neuro non-focal Skin warm and dry No muscular weakness  Lab Results: Basic Metabolic Panel:  Recent Labs  64/40/34 0350 12/30/15 0453  NA 137 137  K 4.0 3.9  CL 106 105  CO2 24 24  GLUCOSE 105* 110*  BUN 21* 19  CREATININE 1.08 0.97  CALCIUM 9.1 9.0   Liver Function Tests:  Recent Labs  12/29/15 0350  AST 25  ALT 33  ALKPHOS 87  BILITOT 0.8  PROT 7.8  ALBUMIN 4.4   CBC:  Recent Labs  12/29/15 0350 12/30/15 0453 12/31/15 0509  WBC 8.7 8.0 7.9  NEUTROABS 4.4  --   --   HGB 13.7 13.7 13.3  HCT 39.0 38.3* 38.6*  MCV 83.0 85.1 83.4  PLT 301 254 269   Cardiac Enzymes:  Recent Labs  12/29/15 0627 12/29/15 1217 12/29/15 1827  TROPONINI <0.03 <0.03 <0.03   BNP: Invalid input(s):  POCBNP D-Dimer: No results for input(s): DDIMER in the last 72 hours. Hemoglobin A1C:  Recent Labs  12/29/15 0350  HGBA1C 6.6*   Fasting Lipid Panel:  Recent Labs  12/29/15 0350  CHOL 136  HDL 22*  LDLCALC UNABLE TO CALCULATE IF TRIGLYCERIDE OVER 400 mg/dL  TRIG 742*  CHOLHDL 6.2    Imaging: No results found.  Cardiac Studies:  ECG:  SR anterolateral MI no acute changes    Telemetry:  NSR no arrhythmia   Echo: 5/16 EF 40-45% apical thrombus images reviewed with definity   Medications:   . aspirin EC  81 mg Oral Daily  . atorvastatin  80 mg Oral q1800  . buPROPion  150 mg Oral BID  . famotidine  20 mg Oral BID  . fluticasone  1 spray Each Nare Daily  . lisinopril  2.5 mg Oral QHS  . metoprolol tartrate  25 mg Oral BID  . sertraline  50 mg Oral Daily  . tamsulosin  0.4 mg Oral Daily  . ticagrelor  90 mg Oral BID  . warfarin  2.5 mg Oral ONCE-1800  .  Warfarin - Pharmacist Dosing Inpatient   Does not apply q1800     . heparin 1,500 Units/hr (12/30/15 1613)    Assessment/Plan:  Chest Pain: 6 months post DES to LAD and RCA with POB of diagonal. Compliant with DAT. Enzymes negative no ECG changes. Atypical and now resolved Do not think cath or myovue currently indicated Continue medical RX DAT and beta blocker Has R/O   CHF: EF 40-45% euvolemic CXR clear continue ACE no indication for AICD  Thrombus: Post anterior MI His agoraphobia and anxiety preclude further delineation with MRI INR Rx   d/c home  Outpatient f/u Dr Tresa Endo  Will arrange f/u coumadin clinic and check INR [place]  Charlton Haws 12/31/2015, 9:06 AM

## 2015-12-31 NOTE — Progress Notes (Signed)
Reviewed discharge information with patient and caregiver. Answered all questions. Patient/caregiver able to teach back medications and reasons to contact MD/911. Verbalizes understanding of GI bleed (signs/syptomst) and verbalizes importance of follow up at coumadin clinic. Patient verbalizes importance of PCP follow up appointment. Earnest Conroy. Clelia Croft, RN

## 2015-12-31 NOTE — Progress Notes (Signed)
ANTICOAGULATION CONSULT NOTE - Initial Consult  Pharmacy Consult for heparin/warfarin Indication: mural apical thrombus  Allergies  Allergen Reactions  . Coconut Fatty Acids     unknown   Patient Measurements: Height: 5\' 10"  (177.8 cm) Weight: 220 lb 0.3 oz (99.8 kg) IBW/kg (Calculated) : 73 Heparin Dosing Weight: 93.8 kg  Vital Signs: Temp: 98 F (36.7 C) (05/21 0614) Temp Source: Oral (05/21 0614) BP: 100/53 mmHg (05/21 0614) Pulse Rate: 66 (05/21 1001)  Labs:  Recent Labs  12/29/15 0350 12/29/15 0627 12/29/15 1217  12/29/15 1827 12/30/15 0003 12/30/15 0453 12/31/15 0509  HGB 13.7  --   --   --   --   --  13.7 13.3  HCT 39.0  --   --   --   --   --  38.3* 38.6*  PLT 301  --   --   --   --   --  254 269  APTT 38*  --   --   --   --   --   --   --   LABPROT 13.3  --   --   --   --   --  14.1 23.8*  INR 1.03  --   --   --   --   --  1.12 2.24*  HEPARINUNFRC  --   --   --   < > 0.57 0.56 0.63 0.54  CREATININE 1.08  --   --   --   --   --  0.97  --   TROPONINI <0.03 <0.03 <0.03  --  <0.03  --   --   --   < > = values in this interval not displayed. Estimated Creatinine Clearance: 100.7 mL/min (by C-G formula based on Cr of 0.97).  Medical History: Past Medical History  Diagnosis Date  . Anxiety   . Depression   . Post traumatic stress disorder   . Elevated cholesterol   . STEMI (ST elevation myocardial infarction) (HCC)   . Diverticulitis   . Apical mural thrombus (HCC)   . Essential hypertension   . BPH (benign prostatic hyperplasia)    Medications:  Prescriptions prior to admission  Medication Sig Dispense Refill Last Dose  . acetaminophen (TYLENOL) 500 MG tablet Take 500 mg by mouth every 6 (six) hours as needed.   prn  . aspirin 81 MG EC tablet Take 1 tablet (81 mg total) by mouth daily. 30 tablet 3 12/29/2015 at Unknown time  . atorvastatin (LIPITOR) 80 MG tablet Take 1 tablet (80 mg total) by mouth daily at 6 PM. 30 tablet 3 12/28/2015 at Unknown time   . buPROPion (WELLBUTRIN SR) 150 MG 12 hr tablet Take 1 tablet (150 mg total) by mouth 2 (two) times daily. 60 tablet 2 12/28/2015 at Unknown time  . cyclobenzaprine (FLEXERIL) 10 MG tablet TAKE 1 TABLET BY MOUTH 3 TIMES DAILY AS NEEDED FOR MUSCLE SPASMS. 30 tablet 0 Past Week at Unknown time  . famotidine (PEPCID) 20 MG tablet Take 1 tablet (20 mg total) by mouth 2 (two) times daily. 60 tablet 3 12/28/2015 at Unknown time  . fluticasone (FLONASE) 50 MCG/ACT nasal spray PLACE 2 SPRAYS INTO BOTH NOSTRILS DAILY. 16 g 2 Past Week at Unknown time  . hydrOXYzine (ATARAX/VISTARIL) 25 MG tablet Take 1 tablet (25 mg total) by mouth 3 (three) times daily as needed. 90 tablet 1 Past Week at Unknown time  . lisinopril (PRINIVIL,ZESTRIL) 2.5 MG tablet Take 1 tablet (2.5 mg  total) by mouth at bedtime. 30 tablet 3 12/28/2015 at Unknown time  . metoprolol tartrate (LOPRESSOR) 25 MG tablet Take 1 tablet (25 mg total) by mouth 2 (two) times daily. 60 tablet 3 12/28/2015 at 2100  . NITROSTAT 0.4 MG SL tablet PLACE 1 TABLET UNDER THE TONGUE EVERY 5 MINUTES AS NEEDED FOR CHEST PAIN. MAX-3 DOSES WITHIN A 15 MINUTE INTERVAL 25 tablet 1 12/29/2015 at Unknown time  . sertraline (ZOLOFT) 50 MG tablet TAKE 1 TABLET BY MOUTH DAILY 30 tablet 3 12/28/2015 at Unknown time  . tamsulosin (FLOMAX) 0.4 MG CAPS capsule Take 1 capsule (0.4 mg total) by mouth daily. 30 capsule 3 12/28/2015 at Unknown time  . ticagrelor (BRILINTA) 90 MG TABS tablet Take 1 tablet (90 mg total) by mouth 2 (two) times daily. 48 tablet 0 12/28/2015 at Unknown time   Assessment: Pharmacy is consulted to dose heparin and warfarin in 55 yo male diagnosed with mural apical thrombus s/p post anterior STEMI in 06/2015 on a 2D echo on 12/26/15. Pt not noted to be on any anticoagulation prior to admission. Pt is on dual antiplatelet therapy with aspirin 81 mg PO daily and ticagrelor 90 mg PO BID.  Pt warfarin education completed on 5/19.   Baseline Labs:   INR 1.03, PT  13.2  Hgb, plt WNL  Scr 1.08   LFT, albumin WNL  12/31/2015   Pt has been on heparin 1500 units/hr  Heparin level this AM was 0.54, within the target range of 0.3 to 0.7  Hgb, plt stable, WNL  INR 2.24, substantial increase from 1.12  Pt has been started on heart healthy diet  No bleeding issues reported by RN  Goal of Therapy:  INR 2-3  Anti-Xa level 0.3-0.7 Monitor platelets by anticoagulation protocol: Yes   Plan:   Per cardiology, continue heparin until INR 2.2 or above   Would recommend another INR within target range as confirmation  Warfarin 2.5 mg PO x1   Continue Heparin 1500 units/hr  Daily Heparin levels, PT/INR, CBC  Monitor for signs/symptoms of bleeding  If pt is to be discharged today, would recommend warfarin 5 mg PO daily until       seen in Coumadin clinic on Wednesday  Adalberto Cole, PharmD, BCPS Pager [phone] 12/31/2015 10:18 AM

## 2016-01-01 MED FILL — WARFARIN SODIUM 5 MG TABLET: 5 | 30 days supply | Qty: 30 | Fill #0

## 2016-01-01 MED FILL — ALPRAZolam 0.5 MG TABS: 0.5 | 20 days supply | Qty: 20 | Fill #0

## 2016-01-01 NOTE — Discharge Summary (Signed)
Physician Discharge Summary  Henry David ZOX:[id number] DOB: [birth year] DOA: 12/29/2015  PCP: Jaclyn Shaggy, MD  Admit date: 12/29/2015 Discharge date: 12/31/2015  Time spent: 45 minutes  Recommendations for Outpatient Follow-up:  1. Dr.Kelly in 1 week, Coumadin clinic on 5/24, newly started on Coumadin  Discharge Diagnoses:  Principal Problem:   Chest pain   LV thrombus   Depression with anxiety   GERD   STEMI 07/09/15   Hyperlipidemia with target LDL less than 70   CAD S/P LAD and RCA DES   Presence of drug coated stent in LAD coronary artery   Obesity   Apical mural thrombus (HCC)   Essential hypertension   BPH (benign prostatic hyperplasia)   Discharge Condition: stable  Diet recommendation: heart healthy  Filed Weights   12/29/15 0335 12/29/15 0754  Weight: 97.523 kg (215 lb) 99.8 kg (220 lb 0.3 oz)    History of present illness:  Henry David is a 55 y.o. male with medical history significant of STEMI (s/p of DES), hyperlipidemia, GERD, depression, anxiety, diverticulitis, who presented with chest pain. Patient reports that he started having chest pain at about 10:30 PM. The chest pain is located in the left side of the chest, moderate, constant, pressure like, non-radiating. Patient does not have shortness of breath, cough, fever or chills. No nausea, vomiting, abdominal pain, diarrhea. Denies symptoms of UTI. He states that he had one episode of transient numbness over left upper thigh, which has completely resolved. Currently no unilateral weakness, numbness, tingling sensations in extremities. No vision change or hearing loss. Of note, he was found to have apical thrombus on 2D echo by Dr. Jens Som (Reader of the day) on 12/26/15, Dr. Tresa Endo was notified. Pt was treated with nitroglycerin and aspirin in the emergency room.  Hospital Course:  Chest pain  -atypical, likely anxiety induced, resolved -EKG unchanged and troponins negative -H/o CAD with STEMI  7months ago with DES to LAD and RCA with POB of diagonal -continue ASA/Brillinta, metoprolol, statin -followed by Cardiology this admission  Apical mural thrombus (HCC): -diagnosed prior to this admission, on outpatient ECHO on 5/16 -started on Warfarin with Heparin per Cardiology -INR therapeutic on Warfarin at discharge -next INR check on Wednesday at [facility] Coumadin clinic  GERD: -Pepcid  HLD: Last LDL was 63 on 09/27/15  -Continue statin   Depression and anxiety: Stable, no suicidal or homicidal ideations. -Continue Wellbutrin and xanax  Hypertension: -Continue metoprolol and lisinopril  BPH: stable - Continue Flomax  Consultations:  Cardiology  Discharge Exam: Filed Vitals:   12/31/15 0614 12/31/15 1001  BP: 100/53   Pulse: 58 66  Temp: 98 F (36.7 C)   Resp: 16     General: AAOx3 Cardiovascular: S1S2/RRR Respiratory: CTAB  Discharge Instructions   Discharge Instructions    Diet - low sodium heart healthy    Complete by:  As directed      Increase activity slowly    Complete by:  As directed           Discharge Medication List as of 12/31/2015  2:07 PM    START taking these medications   Details  ALPRAZolam (XANAX) 0.5 MG tablet Take 1 tablet (0.5 mg total) by mouth daily as needed for anxiety., Starting 12/31/2015, Until Discontinued, Print    warfarin (COUMADIN) 5 MG tablet Take 1 tablet (5 mg total) by mouth one time only at 6 PM. Take 1tab daily until INR check on Wednesday, then as instructed by Pharmacist, Starting 12/31/2015,  Until Discontinued, Print      CONTINUE these medications which have NOT CHANGED   Details  acetaminophen (TYLENOL) 500 MG tablet Take 500 mg by mouth every 6 (six) hours as needed., Until Discontinued, Historical Med    aspirin 81 MG EC tablet Take 1 tablet (81 mg total) by mouth daily., Starting 10/04/2015, Until Discontinued, Normal    atorvastatin (LIPITOR) 80 MG tablet Take 1 tablet (80 mg total) by mouth daily at  6 PM., Starting 10/04/2015, Until Discontinued, Normal    buPROPion (WELLBUTRIN SR) 150 MG 12 hr tablet Take 1 tablet (150 mg total) by mouth 2 (two) times daily., Starting 10/04/2015, Until Discontinued, Normal    cyclobenzaprine (FLEXERIL) 10 MG tablet TAKE 1 TABLET BY MOUTH 3 TIMES DAILY AS NEEDED FOR MUSCLE SPASMS., Normal    famotidine (PEPCID) 20 MG tablet Take 1 tablet (20 mg total) by mouth 2 (two) times daily., Starting 10/04/2015, Until Discontinued, Normal    fluticasone (FLONASE) 50 MCG/ACT nasal spray PLACE 2 SPRAYS INTO BOTH NOSTRILS DAILY., Normal    lisinopril (PRINIVIL,ZESTRIL) 2.5 MG tablet Take 1 tablet (2.5 mg total) by mouth at bedtime., Starting 10/05/2015, Until Discontinued, No Print    metoprolol tartrate (LOPRESSOR) 25 MG tablet Take 1 tablet (25 mg total) by mouth 2 (two) times daily., Starting 10/04/2015, Until Discontinued, Normal    NITROSTAT 0.4 MG SL tablet PLACE 1 TABLET UNDER THE TONGUE EVERY 5 MINUTES AS NEEDED FOR CHEST PAIN. MAX-3 DOSES WITHIN A 15 MINUTE INTERVAL, Normal    sertraline (ZOLOFT) 50 MG tablet TAKE 1 TABLET BY MOUTH DAILY, Normal    tamsulosin (FLOMAX) 0.4 MG CAPS capsule Take 1 capsule (0.4 mg total) by mouth daily., Starting 10/04/2015, Until Discontinued, Normal    ticagrelor (BRILINTA) 90 MG TABS tablet Take 1 tablet (90 mg total) by mouth 2 (two) times daily., Starting 09/27/2015, Until Discontinued, Sample      STOP taking these medications     hydrOXYzine (ATARAX/VISTARIL) 25 MG tablet        Allergies  Allergen Reactions  . Coconut Fatty Acids     unknown   Follow-up Information    Follow up with Nicki Guadalajara, MD In 1 week.   Specialty:  Cardiology   Why:  Office will call with FU   Contact information:   [address] [place] [address] [phone]       Follow up with [facility] On 01/03/2016.   Specialty:  Cardiology   Why:  Coumadin Clinic, on Wednesday for INR check to monitor  Coumadin dosing-VERY IMPORTANT   Contact information:   [address] [place] [address] [phone]       The results of significant diagnostics from this hospitalization (including imaging, microbiology, ancillary and laboratory) are listed below for reference.    Significant Diagnostic Studies: Dg Chest 2 View  12/29/2015  CLINICAL DATA:  Chest pain, onset this morning. Anxious with tachycardia. History of heart disease. Previous smoker. EXAM: CHEST  2 VIEW COMPARISON:  07/09/2015 FINDINGS: The heart size and mediastinal contours are within normal limits. Both lungs are clear. The visualized skeletal structures are unremarkable. IMPRESSION: No active cardiopulmonary disease. Electronically Signed   By: Burman Nieves M.D.   On: 12/29/2015 04:41    Microbiology: No results found for this or any previous visit (from the past 240 hour(s)).   Labs: Basic Metabolic Panel:  Recent Labs Lab 12/29/15 0350 12/30/15 0453  NA 137  137  K 4.0 3.9  CL 106 105  CO2 24 24  GLUCOSE 105* 110*  BUN 21* 19  CREATININE 1.08 0.97  CALCIUM 9.1 9.0   Liver Function Tests:  Recent Labs Lab 12/29/15 0350  AST 25  ALT 33  ALKPHOS 87  BILITOT 0.8  PROT 7.8  ALBUMIN 4.4   No results for input(s): LIPASE, AMYLASE in the last 168 hours. No results for input(s): AMMONIA in the last 168 hours. CBC:  Recent Labs Lab 12/29/15 0350 12/30/15 0453 12/31/15 0509  WBC 8.7 8.0 7.9  NEUTROABS 4.4  --   --   HGB 13.7 13.7 13.3  HCT 39.0 38.3* 38.6*  MCV 83.0 85.1 83.4  PLT 301 254 269   Cardiac Enzymes:  Recent Labs Lab 12/29/15 0350 12/29/15 0627 12/29/15 1217 12/29/15 1827  TROPONINI <0.03 <0.03 <0.03 <0.03   BNP: BNP (last 3 results)  Recent Labs  12/30/15 0453  BNP 42.5    ProBNP (last 3 results) No results for input(s): PROBNP in the last 8760 hours.  CBG: No results for input(s): GLUCAP in the last 168  hours.     SignedZannie Cove MD.  [facility] 01/01/2016, 4:41 PM

## 2016-01-02 NOTE — Telephone Encounter (Signed)
Note will be routed to Dr. Venetia Night.

## 2016-01-03 ENCOUNTER — Ambulatory Visit (INDEPENDENT_AMBULATORY_CARE_PROVIDER_SITE_OTHER): Payer: Self-pay | Admitting: Pharmacist Clinician (PhC)/ Clinical Pharmacy Specialist

## 2016-01-03 ENCOUNTER — Telehealth: Payer: Self-pay | Admitting: Cardiovascular Disease

## 2016-01-03 DIAGNOSIS — I513 Intracardiac thrombosis, not elsewhere classified: Secondary | ICD-10-CM

## 2016-01-03 DIAGNOSIS — Z7901 Long term (current) use of anticoagulants: Secondary | ICD-10-CM

## 2016-01-03 DIAGNOSIS — I213 ST elevation (STEMI) myocardial infarction of unspecified site: Secondary | ICD-10-CM

## 2016-01-03 LAB — POCT INR: INR: 5

## 2016-01-03 NOTE — Patient Instructions (Signed)
Wrote out dose as follows: 5/24 none 5/25 none 5/26 1/2 tablet 5/27 1 tablet 5/28 1 tablet 5/29 1/2 tablet 5/30 1 tablet 5/31 1 tablet 6/1   1 tablet 6/2 - visit with Dr. Gwynneth Macleod check

## 2016-01-03 NOTE — Telephone Encounter (Signed)
New message      The pt is calling coumadin check for blood the pt wants to make he can  take normal medications eat and drink if that is ok

## 2016-01-03 NOTE — Telephone Encounter (Signed)
Spoke to patient. Advised OK to eat and drink (pt wanted to make sure test not affected) Advised to take his regular meds today (pt was not sure if he should take other medications before check) Discussed general expectations of coumadin visit and things Belenda Cruise will discuss as far as foods to avoid, notifying us when starting antibiotics, etc.  Pt voiced understanding and thanks.

## 2016-01-12 ENCOUNTER — Ambulatory Visit (INDEPENDENT_AMBULATORY_CARE_PROVIDER_SITE_OTHER): Payer: Self-pay | Admitting: Pharmacist Clinician (PhC)/ Clinical Pharmacy Specialist

## 2016-01-12 ENCOUNTER — Ambulatory Visit (INDEPENDENT_AMBULATORY_CARE_PROVIDER_SITE_OTHER): Payer: Self-pay | Admitting: Cardiovascular Disease

## 2016-01-12 ENCOUNTER — Encounter: Payer: Self-pay | Admitting: Cardiovascular Disease

## 2016-01-12 VITALS — BP 112/74 | HR 60 | Ht 70.0 in | Wt 225.0 lb

## 2016-01-12 DIAGNOSIS — Z7901 Long term (current) use of anticoagulants: Secondary | ICD-10-CM

## 2016-01-12 DIAGNOSIS — Z9861 Coronary angioplasty status: Secondary | ICD-10-CM

## 2016-01-12 DIAGNOSIS — Z79899 Other long term (current) drug therapy: Secondary | ICD-10-CM

## 2016-01-12 DIAGNOSIS — I2510 Atherosclerotic heart disease of native coronary artery without angina pectoris: Secondary | ICD-10-CM

## 2016-01-12 DIAGNOSIS — F418 Other specified anxiety disorders: Secondary | ICD-10-CM

## 2016-01-12 DIAGNOSIS — I213 ST elevation (STEMI) myocardial infarction of unspecified site: Secondary | ICD-10-CM

## 2016-01-12 DIAGNOSIS — I513 Intracardiac thrombosis, not elsewhere classified: Secondary | ICD-10-CM

## 2016-01-12 DIAGNOSIS — E785 Hyperlipidemia, unspecified: Secondary | ICD-10-CM

## 2016-01-12 DIAGNOSIS — I255 Ischemic cardiomyopathy: Secondary | ICD-10-CM

## 2016-01-12 DIAGNOSIS — N139 Obstructive and reflux uropathy, unspecified: Secondary | ICD-10-CM

## 2016-01-12 DIAGNOSIS — I2102 ST elevation (STEMI) myocardial infarction involving left anterior descending coronary artery: Secondary | ICD-10-CM

## 2016-01-12 DIAGNOSIS — I10 Essential (primary) hypertension: Secondary | ICD-10-CM

## 2016-01-12 LAB — POCT INR: INR: 4.8

## 2016-01-12 MED ORDER — CLOPIDOGREL BISULFATE 75 MG PO TABS: 75.0000 mg | ORAL_TABLET | Freq: Every day | ORAL | Status: DC

## 2016-01-12 MED FILL — CLOPIDOGREL 75 MG TABLET: 75 | 90 days supply | Qty: 90 | Fill #0

## 2016-01-12 NOTE — Patient Instructions (Signed)
Medication Instructions: Dr Tresa Endo has recommended making the following medication changes: 1. STOP Brilinta 2. START Clopidogrel 75 mg - take 1 tablet by mouth daily  Labwork: Your physician recommends that you return for lab work in 1 week. This blood work has to be completed at [facility].  Testing/Procedures: NONE ORDERED  Follow-up: Dr Tresa Endo recommends that you schedule a follow-up appointment in 3 months.  If you need a refill on your cardiac medications before your next appointment, please call your pharmacy.

## 2016-01-14 ENCOUNTER — Encounter: Payer: Self-pay | Admitting: Cardiovascular Disease

## 2016-01-14 DIAGNOSIS — I513 Intracardiac thrombosis, not elsewhere classified: Secondary | ICD-10-CM | POA: Insufficient documentation

## 2016-01-14 NOTE — Progress Notes (Signed)
Patient ID: Henry David, male   DOB: [birth year], 55 y.o.   MRN: [id number]      HPI: Henry David is a 55 y.o. male who presents to the office today for a 4 month  follow up cardiology evaluation.  Henry David has a history of anxiety , depression and stress disorder.  He had not seen a physician in years in the past was told of having high cholesterol but never received treatment. On July 09, 2015 he developed new onset chest pain leading to a [facility] emergency room presentation.  His ECG several hours after presentation revealed anterolateral ST elevation and he was transported to [facility] for emergent catheterization and PCI. Catheterization was performed by me, which revealed mild acute LV dysfunction with mid anterolateral hypocontractility and an EF of 45-50%.  There was significant multivessel CAD with the LAD giving rise to a bifurcating diagonal vessel with tubular narrowing of 75-80% and after the takeoff of the proximal ulcerative perforating artery the LAD had a 95% napkin ring stenosis followed by 60% stenosis in its midsegment. The left circumflex coronary artery was felt to have an 80% eccentric narrowing in the first marginal branch. The RCA was a large dominant vessel with diffuse 80% proximal stenosis.  He underwent successful intervention with PTCA stenting of the mid LAD with insertion of a 2.7518 mm Xience Alpine stent postdilated to 2.97 mm in the 100% occlusion reduced to 0%, and PTCA of his diagonal stenosis being reduced to approximate 5%. 2 days later he underwent repeat catheterization which showed patent intervention sites.  His circumflex marginal stenosis had improved and now only appeared 30%. His RCA stenosis appeared 90% and he underwent successful stenting of the RCA  with insertion of a 4.018 mm Xience Alpine DES stent postdilated to 4.51 mm with the stenoses being reduced to 0% in the staged procedure.   When I saw him 4 months ago he remained  stable without recurrent chest pain development.  He was on  aspirin and Brilinta 90 mg twice a day for dual antiplatelet therapy.  He has been on metoprolol, tartrate 25 mg twice a day and low-dose lisinopril 2.5 mg in the morning for CAD.  He has had issues with low blood pressure and had been off lisinopril.  I suggested since his blood pressure was stable of rechallenge of low-dose therapy..  For his anxiety. He was on Wellbutrin 150 mg twice a day.  He was tolerating atorvastatin 80 mg for his hyperlipidemia.  He denied myalgias.    I recommended that he have an echo Doppler study to reassess LV function.  The echo study was done on 12/26/2015 showed an EF of 40-45%.  There was akinesis of the anteroseptal and apical myocardium and he had grade 1 diastolic dysfunction.  Aortic root was mildly dilated.  There appeared to be an apical thrombus on definity evaluation.  He was hospitalized on 12/29/2015 with palpitations and increased anxiety.  During that evaluation, he was started on Coumadin in light of his apical thrombus.  He presents for follow-up evaluation.   Past Medical History  Diagnosis Date  . Anxiety   . Depression   . Post traumatic stress disorder   . Elevated cholesterol   . STEMI (ST elevation myocardial infarction) ([place])   . Diverticulitis   . 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 Essential hypertension   . BPH (benign prostatic hyperplasia)     Past Surgical History  Procedure Laterality Date  .  Knee surgery    . Cardiac catheterization N/A 07/09/2015    Procedure: Left Heart Cath and Coronary Angiography;  Surgeon: Troy Sine, MD;  Location: [facility];  Service: Cardiovascular;  Laterality: N/A;  . Cardiac catheterization N/A 07/09/2015    Procedure: Coronary Stent Intervention;  Surgeon: Troy Sine, MD;  Location: [facility];  Service: Cardiovascular;  Laterality: N/A;  . Cardiac catheterization N/A 07/11/2015    Procedure: Coronary Stent  Intervention;  Surgeon: Troy Sine, MD;  Location: [facility];  Service: Cardiovascular;  Laterality: N/A;    Allergies  Allergen Reactions  . Coconut Fatty Acids     unknown    Current Outpatient Prescriptions  Medication Sig Dispense Refill  . acetaminophen (TYLENOL) 500 MG tablet Take 500 mg by mouth every 6 (six) hours as needed.    . ALPRAZolam (XANAX) 0.5 MG tablet Take 1 tablet (0.5 mg total) by mouth daily as needed for anxiety. 20 tablet 0  . aspirin 81 MG EC tablet Take 1 tablet (81 mg total) by mouth daily. 30 tablet 3  . atorvastatin (LIPITOR) 80 MG tablet Take 1 tablet (80 mg total) by mouth daily at 6 PM. 30 tablet 3  . buPROPion (WELLBUTRIN SR) 150 MG 12 hr tablet Take 1 tablet (150 mg total) by mouth 2 (two) times daily. 60 tablet 2  . cyclobenzaprine (FLEXERIL) 10 MG tablet TAKE 1 TABLET BY MOUTH 3 TIMES DAILY AS NEEDED FOR MUSCLE SPASMS. 30 tablet 0  . famotidine (PEPCID) 20 MG tablet Take 1 tablet (20 mg total) by mouth 2 (two) times daily. 60 tablet 3  . fluticasone (FLONASE) 50 MCG/ACT nasal spray PLACE 2 SPRAYS INTO BOTH NOSTRILS DAILY. 16 g 2  . lisinopril (PRINIVIL,ZESTRIL) 2.5 MG tablet Take 1 tablet (2.5 mg total) by mouth at bedtime. 30 tablet 3  . metoprolol tartrate (LOPRESSOR) 25 MG tablet Take 1 tablet (25 mg total) by mouth 2 (two) times daily. 60 tablet 3  . NITROSTAT 0.4 MG SL tablet PLACE 1 TABLET UNDER THE TONGUE EVERY 5 MINUTES AS NEEDED FOR CHEST PAIN. MAX-3 DOSES WITHIN A 15 MINUTE INTERVAL 25 tablet 1  . sertraline (ZOLOFT) 50 MG tablet TAKE 1 TABLET BY MOUTH DAILY 30 tablet 3  . tamsulosin (FLOMAX) 0.4 MG CAPS capsule Take 1 capsule (0.4 mg total) by mouth daily. 30 capsule 3  . warfarin (COUMADIN) 5 MG tablet Take 1 tablet (5 mg total) by mouth one time only at 6 PM. Take 1tab daily until INR check on Wednesday, then as instructed by Pharmacist 30 tablet 0  . clopidogrel (PLAVIX) 75 MG tablet Take 1 tablet (75 mg total) by mouth daily. 90  tablet 3   No current facility-administered medications for this visit.    Social History   Social History  . Marital Status: Single    Spouse Name: N/A  . Number of Children: N/A  . Years of Education: N/A   Occupational History  . Not on file.   Social History Main Topics  . Smoking status: Former Smoker -- 0.25 packs/day    Types: Cigarettes    Quit date: 07/04/2015  . Smokeless tobacco: Not on file  . Alcohol Use: 1.8 oz/week    3 Cans of beer per week     Comment: occassional use  . Drug Use: No  . Sexual Activity: Not Currently   Other Topics Concern  . Not on file   Social History Narrative  Family History  Problem Relation Age of Onset  . Heart disease Mother   . Heart disease Father     ROS General: Negative; No fevers, chills, or night sweats HEENT: Negative; No changes in vision or hearing, sinus congestion, difficulty swallowing Pulmonary: Negative; No cough, wheezing, shortness of breath, hemoptysis Cardiovascular: See HPI: No chest pain, presyncope, syncope, palpatations GI: Negative; No nausea, vomiting, diarrhea, or abdominal pain GU: Negative; No dysuria, hematuria, or difficulty voiding Musculoskeletal: Negative; no myalgias, joint pain, or weakness Hematologic: Negative; no easy bruising, bleeding Endocrine: Negative; no heat/cold intolerance; no diabetes, Neuro: Negative; no changes in balance, headaches Skin: Negative; No rashes or skin lesions Psychiatric: Negative; No behavioral problems, depression Sleep: Negative; No snoring,  daytime sleepiness, hypersomnolence, bruxism, restless legs, hypnogognic hallucinations. Other comprehensive 14 point system review is negative   Physical Exam BP 112/74 mmHg  Pulse 60  Ht _0  (1.778 m)  Wt 225 lb (102.059 kg)  BMI 32.28 kg/m2  SpO2 97% Wt Readings from Last 3 Encounters:  01/12/16 225 lb (102.059 kg)  12/29/15 220 lb 0.3 oz (99.8 kg)  10/05/15 216 lb 8 oz (98.204 kg)   General:  Alert, oriented, no distress.  Skin: normal turgor, no rashes, warm and dry HEENT: Normocephalic, atraumatic. Pupils equal round and reactive to light; sclera anicteric; extraocular muscles intact, No lid lag; Nose without nasal septal hypertrophy; Mouth/Parynx benign; Mallinpatti scale 3 Neck: No JVD, no carotid bruits; normal carotid upstroke Lungs: clear to ausculatation and percussion bilaterally; no wheezing or rales, normal inspiratory and expiratory effort Chest wall: without tenderness to palpitation Heart: PMI not displaced, RRR, s1 s2 normal, 1/6 systolic murmur, No diastolic murmur, no rubs, gallops, thrills, or heaves Abdomen: soft, nontender; no hepatosplenomehaly, BS+; abdominal aorta nontender and not dilated by palpation. Back: no CVA tenderness Pulses: 2+  Musculoskeletal: full range of motion, normal strength, no joint deformities Extremities: Pulses 2+, no clubbing cyanosis or edema, Homan's sign negative  Neurologic: grossly nonfocal; Cranial nerves grossly wnl Psychologic: Normal mood and affect  ECG (independently read by me): Normal sinus rhythm at 60 bpm.  Low voltage.  No ST segment changes.  Normal intervals.  February 2017 ECG (independently read by me):  Normal sinus rhythm at 70 bpm. Nonspecific T abnormality in lead 3. Poor progression V1 through V3.Marland Kitchen  Normal intervals.  LABS:  BMP Latest Ref Rng 12/30/2015 12/29/2015 09/27/2015  Glucose 65 - 99 mg/dL 110(H) 105(H) 105(H)  BUN 6 - 20 mg/dL 19 21(H) 18  Creatinine 0.61 - 1.24 mg/dL 0.97 1.08 1.01  Sodium 135 - 145 mmol/L 137 137 140  Potassium 3.5 - 5.1 mmol/L 3.9 4.0 4.8  Chloride 101 - 111 mmol/L 105 106 104  CO2 22 - 32 mmol/L _1 Calcium 8.9 - 10.3 mg/dL 9.0 9.1 9.2     Hepatic Function Latest Ref Rng 12/29/2015 09/27/2015 07/11/2015  Total Protein 6.5 - 8.1 g/dL 7.8 7.3 7.6  Albumin 3.5 - 5.0 g/dL 4.4 4.3 3.9  AST 15 - 41 U/L 25 25 196(H)  ALT 17 - 63 U/L 33 39 53  Alk Phosphatase 38 - 126 U/L  87 100 76  Total Bilirubin 0.3 - 1.2 mg/dL 0.8 0.5 0.9  Bilirubin, Direct 0.1 - 0.5 mg/dL - - <0.1(L)    CBC Latest Ref Rng 12/31/2015 12/30/2015 12/29/2015  WBC 4.0 - 10.5 K/uL 7.9 8.0 8.7  Hemoglobin 13.0 - 17.0 g/dL 13.3 13.7 13.7  Hematocrit 39.0 - 52.0 % 38.6(L) 38.3(L) 39.0  Platelets 150 - 400 K/uL 269 254 301   Lab Results  Component Value Date   MCV 83.4 12/31/2015   MCV 85.1 12/30/2015   MCV 83.0 12/29/2015    Lab Results  Component Value Date   TSH 2.27 05/22/2007    BNP    Component Value Date/Time   BNP 42.5 12/30/2015 0453    ProBNP No results found for: PROBNP   Lipid Panel     Component Value Date/Time   CHOL 136 12/29/2015 0350   TRIG 485* 12/29/2015 0350   HDL 22* 12/29/2015 0350   CHOLHDL 6.2 12/29/2015 0350   VLDL UNABLE TO CALCULATE IF TRIGLYCERIDE OVER 400 mg/dL 12/29/2015 0350   LDLCALC UNABLE TO CALCULATE IF TRIGLYCERIDE OVER 400 mg/dL 12/29/2015 0350   LDLDIRECT 103.1 05/22/2007 0922     RADIOLOGY: Dg Chest 2 View  12/29/2015  CLINICAL DATA:  Chest pain, onset this morning. Anxious with tachycardia. History of heart disease. Previous smoker. EXAM: CHEST  2 VIEW COMPARISON:  07/09/2015 FINDINGS: The heart size and mediastinal contours are within normal limits. Both lungs are clear. The visualized skeletal structures are unremarkable. IMPRESSION: No active cardiopulmonary disease. Electronically Signed   By: Lucienne Capers M.D.   On: 12/29/2015 04:41      ASSESSMENT AND PLAN: Henry David  Is a 55 year old male who suffered and anterior ST segment elevation myocardial infarction on 07/09/2015 at which time he underwent successful acute intervention to totally occluded LAD and high-grade diagonal vessel as noted above.  He subsequently underwent staged intervention 2 days later to his RCA.  Clinically, he has felt well without recurrent anginal symptomatology.  His blood pressure today is stable but low on his medical regimen consisting of  lisinopril 2.5 mg, metoprolol, tartrate 25 mg twice a day.  I reviewed his most recent echo Doppler study with him in detail.  This revealed a suggestion of an apical thrombus by Definity contrast evaluation.  This is concordant with his wall motion abnormalities.  During his recent hospitalization.  He was started on Coumadin therapy.  However, now that he is on Coumadin.  I have suggested that he discontinue Brilinta and in its place take Plavix 75 mg due to potential bleed risk.  He will continue aspirin 81 mg.  I will check a P2Y12 test in 1-2 weeks to make certain he is Plavix responsive.  His INR today was supratherapeutic at 4.8 and his Coumadin dose was adjusted.  He will hold it today and tomorrow and have a follow-up INR in 1 week.  He is on atorvastatin 80 mg for hyperlipidemia with target LDL less than 70.  When I last saw him, I suggested the addition of omega-3 fatty acids due to his markedly elevated triglycerides.  He has quit tobacco.  He is not having anginal symptoms.  I will see him in the office in 2-3 months for reevaluation.  Time spent: 25 minutes  Troy Sine, MD, [facility]  01/14/2016 8:49 PM

## 2016-01-19 ENCOUNTER — Ambulatory Visit (INDEPENDENT_AMBULATORY_CARE_PROVIDER_SITE_OTHER): Payer: Self-pay | Admitting: Pharmacist

## 2016-01-19 ENCOUNTER — Telehealth: Payer: Self-pay | Admitting: Family Medicine

## 2016-01-19 DIAGNOSIS — I2510 Atherosclerotic heart disease of native coronary artery without angina pectoris: Secondary | ICD-10-CM

## 2016-01-19 DIAGNOSIS — R399 Unspecified symptoms and signs involving the genitourinary system: Secondary | ICD-10-CM

## 2016-01-19 DIAGNOSIS — I213 ST elevation (STEMI) myocardial infarction of unspecified site: Secondary | ICD-10-CM

## 2016-01-19 DIAGNOSIS — Z9861 Coronary angioplasty status: Principal | ICD-10-CM

## 2016-01-19 DIAGNOSIS — F17200 Nicotine dependence, unspecified, uncomplicated: Secondary | ICD-10-CM

## 2016-01-19 DIAGNOSIS — K219 Gastro-esophageal reflux disease without esophagitis: Secondary | ICD-10-CM

## 2016-01-19 DIAGNOSIS — I513 Intracardiac thrombosis, not elsewhere classified: Secondary | ICD-10-CM

## 2016-01-19 DIAGNOSIS — Z7901 Long term (current) use of anticoagulants: Secondary | ICD-10-CM

## 2016-01-19 LAB — POCT INR: INR: 1

## 2016-01-19 NOTE — Telephone Encounter (Signed)
Pt called requesting refills on all of his medication Pt was told that some medications may not be able to be refilled due to not being prescribed by PCP Pt has been trying to get an appointment with PCP since May but there is no availability  Pls assist

## 2016-01-22 MED ORDER — SERTRALINE HCL 50 MG PO TABS: 50.0000 mg | ORAL_TABLET | Freq: Every day | ORAL | Status: DC

## 2016-01-22 MED ORDER — FAMOTIDINE 20 MG PO TABS: 20.0000 mg | ORAL_TABLET | Freq: Two times a day (BID) | ORAL | Status: DC

## 2016-01-22 MED ORDER — BUPROPION HCL ER (SR) 150 MG PO TB12: 150.0000 mg | ORAL_TABLET | Freq: Two times a day (BID) | ORAL | Status: DC

## 2016-01-22 MED ORDER — TAMSULOSIN HCL 0.4 MG PO CAPS: 0.4000 mg | ORAL_CAPSULE | Freq: Every day | ORAL | Status: DC

## 2016-01-22 MED ORDER — ASPIRIN 81 MG PO TBEC: 81.0000 mg | DELAYED_RELEASE_TABLET | Freq: Every day | ORAL | Status: DC

## 2016-01-22 MED ORDER — METOPROLOL TARTRATE 25 MG PO TABS: 25.0000 mg | ORAL_TABLET | Freq: Two times a day (BID) | ORAL | Status: DC

## 2016-01-22 MED ORDER — ATORVASTATIN CALCIUM 80 MG PO TABS: 80.0000 mg | ORAL_TABLET | Freq: Every day | ORAL | Status: DC

## 2016-01-22 MED FILL — ?SERTRALINE HCL 50 MG TABLE: 50 | 30 days supply | Qty: 30 | Fill #0

## 2016-01-22 MED FILL — ?TAMSULOSIN HCL 0.4 MG CAP: 0.4 | 30 days supply | Qty: 30 | Fill #0

## 2016-01-22 MED FILL — BUPROPION SR 150 MG TABLET: 150 | 30 days supply | Qty: 60 | Fill #0

## 2016-01-22 MED FILL — ?FAMOTIDINE 20 MG TABLET: 20 | 30 days supply | Qty: 60 | Fill #0

## 2016-01-22 MED FILL — ?METOPROLOL 25 MG TABLET: 25 | 30 days supply | Qty: 60 | Fill #0

## 2016-01-22 MED FILL — ATORVASTATIN 80 MG TABLET: 80 | 30 days supply | Qty: 30 | Fill #0

## 2016-01-22 NOTE — Telephone Encounter (Signed)
I have refilled patient's medications that were due for refill. Please have patient call pharmacy in the future for refills so we know exactly what needs to be refilled.

## 2016-01-24 ENCOUNTER — Encounter: Payer: Self-pay | Admitting: *Deleted

## 2016-01-24 ENCOUNTER — Other Ambulatory Visit (HOSPITAL_COMMUNITY)
Admission: AD | Admit: 2016-01-24 | Discharge: 2016-01-24 | Disposition: A | Discharge status: Home / Self Care | Payer: No Typology Code available for payment source | Source: Ambulatory Visit | Attending: Cardiovascular Disease | Admitting: Cardiovascular Disease

## 2016-01-24 ENCOUNTER — Other Ambulatory Visit: Payer: Self-pay | Admitting: Family Medicine

## 2016-01-24 DIAGNOSIS — Z7902 Long term (current) use of antithrombotics/antiplatelets: Secondary | ICD-10-CM | POA: Insufficient documentation

## 2016-01-24 DIAGNOSIS — Z5181 Encounter for therapeutic drug level monitoring: Secondary | ICD-10-CM | POA: Insufficient documentation

## 2016-01-24 LAB — PLATELET INHIBITION P2Y12: Platelet Function  P2Y12: 57 [PRU] — ABNORMAL LOW (ref 194–418)

## 2016-01-24 MED FILL — NITROSTAT 0.4 MG TABLET SL: 0.4 | 25 days supply | Qty: 25 | Fill #1

## 2016-01-24 MED FILL — LISINOPRIL 2.5 MG TABLET: 2.5 | 30 days supply | Qty: 30 | Fill #2

## 2016-01-24 MED FILL — ?CYCLOBENZAPRINE 10 MG TABL: 10 | 10 days supply | Qty: 30 | Fill #0

## 2016-01-24 MED FILL — FLUTICASONE PROP 50 MCG SPR: 50 | 30 days supply | Qty: 16 | Fill #3

## 2016-01-25 ENCOUNTER — Telehealth: Payer: Self-pay | Admitting: *Deleted

## 2016-01-25 NOTE — Telephone Encounter (Signed)
Called and informed patient of lad results and recommendations. Patient voiced understanding. He has a question about Brilinta recommended that he ask Baxter Hire on Monday when he comes to have his INR checked.

## 2016-01-25 NOTE — Telephone Encounter (Signed)
-----   Message from Lennette Bihari, MD sent at 01/24/2016 10:07 PM EDT ----- continue plavix; ? If hyper-responder

## 2016-01-29 ENCOUNTER — Ambulatory Visit (INDEPENDENT_AMBULATORY_CARE_PROVIDER_SITE_OTHER): Payer: No Typology Code available for payment source | Admitting: Pharmacist

## 2016-01-29 DIAGNOSIS — I213 ST elevation (STEMI) myocardial infarction of unspecified site: Secondary | ICD-10-CM

## 2016-01-29 DIAGNOSIS — Z7901 Long term (current) use of anticoagulants: Secondary | ICD-10-CM

## 2016-01-29 DIAGNOSIS — I513 Intracardiac thrombosis, not elsewhere classified: Secondary | ICD-10-CM

## 2016-01-29 LAB — POCT INR: INR: 3.6

## 2016-02-08 ENCOUNTER — Telehealth: Payer: Self-pay | Admitting: Cardiovascular Disease

## 2016-02-08 ENCOUNTER — Other Ambulatory Visit: Payer: Self-pay | Admitting: Pharmacist

## 2016-02-08 MED ORDER — WARFARIN SODIUM 5 MG PO TABS: ORAL_TABLET | ORAL | Status: DC

## 2016-02-08 NOTE — Telephone Encounter (Signed)
New message       *STAT* If patient is at the pharmacy, call can be transferred to refill team.   1. Which medications need to be refilled? (please list name of each medication and dose if known) warfarin 5mg  2. Which pharmacy/location (including street and city if local pharmacy) is medication to [facility] sent to[facility] 3. Do they need a 30 day or 90 day supply? 30 day

## 2016-02-08 NOTE — Telephone Encounter (Signed)
Rx sent to pharmacy - spoke to patient and confirmed appt for Monday.

## 2016-02-12 ENCOUNTER — Ambulatory Visit (INDEPENDENT_AMBULATORY_CARE_PROVIDER_SITE_OTHER): Payer: Self-pay | Admitting: Pharmacist

## 2016-02-12 DIAGNOSIS — I513 Intracardiac thrombosis, not elsewhere classified: Secondary | ICD-10-CM

## 2016-02-12 DIAGNOSIS — Z7901 Long term (current) use of anticoagulants: Secondary | ICD-10-CM

## 2016-02-12 DIAGNOSIS — I213 ST elevation (STEMI) myocardial infarction of unspecified site: Secondary | ICD-10-CM

## 2016-02-12 LAB — POCT INR: INR: 3.6

## 2016-02-19 MED FILL — WARFARIN SODIUM 5 MG TABLET: 5 | 30 days supply | Qty: 30 | Fill #0

## 2016-02-26 ENCOUNTER — Ambulatory Visit (INDEPENDENT_AMBULATORY_CARE_PROVIDER_SITE_OTHER): Payer: Self-pay | Admitting: Pharmacist Clinician (PhC)/ Clinical Pharmacy Specialist

## 2016-02-26 DIAGNOSIS — I513 Intracardiac thrombosis, not elsewhere classified: Secondary | ICD-10-CM

## 2016-02-26 DIAGNOSIS — I213 ST elevation (STEMI) myocardial infarction of unspecified site: Secondary | ICD-10-CM

## 2016-02-26 DIAGNOSIS — Z7901 Long term (current) use of anticoagulants: Secondary | ICD-10-CM

## 2016-02-26 LAB — POCT INR: INR: 1.2

## 2016-02-27 ENCOUNTER — Other Ambulatory Visit: Payer: Self-pay | Admitting: Family Medicine

## 2016-02-27 MED FILL — ?TAMSULOSIN HCL 0.4 MG CAP: 0.4 | 30 days supply | Qty: 30 | Fill #2

## 2016-02-27 MED FILL — ?SERTRALINE HCL 50 MG TABLE: 50 | 30 days supply | Qty: 30 | Fill #1

## 2016-02-27 MED FILL — ATORVASTATIN 80 MG TABLET: 80 | 30 days supply | Qty: 30 | Fill #2

## 2016-02-27 MED FILL — ?BUPROPION HCL SR 150 MG TA: 150 | 30 days supply | Qty: 60 | Fill #0

## 2016-02-27 MED FILL — LISINOPRIL 2.5 MG TABLET: 2.5 | 30 days supply | Qty: 30 | Fill #3

## 2016-02-27 MED FILL — ?HYDROXYZINE HCL 25 MG TAB: 25 | 30 days supply | Qty: 60 | Fill #0

## 2016-02-27 MED FILL — FAMOTIDINE 20 MG TABLET: 20 | 30 days supply | Qty: 60 | Fill #2

## 2016-02-27 MED FILL — NITROSTAT 0.4 MG TABLET SL: 0.4 | 25 days supply | Qty: 25 | Fill #0

## 2016-02-27 MED FILL — FLUTICASONE PROP 50 MCG SPR: 50 | 30 days supply | Qty: 16 | Fill #0

## 2016-02-27 MED FILL — ?METOPROLOL 25 MG TABLET: 25 | 30 days supply | Qty: 60 | Fill #1

## 2016-02-27 MED FILL — ?CYCLOBENZAPRINE 10 MG TABL: 10 | 10 days supply | Qty: 30 | Fill #0

## 2016-03-04 ENCOUNTER — Ambulatory Visit: Payer: Self-pay | Attending: Family Medicine

## 2016-03-08 ENCOUNTER — Encounter: Payer: Self-pay | Admitting: Family Medicine

## 2016-03-08 ENCOUNTER — Ambulatory Visit: Payer: Self-pay | Attending: Family Medicine | Admitting: Family Medicine

## 2016-03-08 VITALS — BP 107/70 | HR 62 | Temp 98.2°F | Ht 68.6 in | Wt 226.2 lb

## 2016-03-08 DIAGNOSIS — F418 Other specified anxiety disorders: Secondary | ICD-10-CM | POA: Insufficient documentation

## 2016-03-08 DIAGNOSIS — Z79899 Other long term (current) drug therapy: Secondary | ICD-10-CM | POA: Insufficient documentation

## 2016-03-08 DIAGNOSIS — Z9861 Coronary angioplasty status: Secondary | ICD-10-CM

## 2016-03-08 DIAGNOSIS — K219 Gastro-esophageal reflux disease without esophagitis: Secondary | ICD-10-CM | POA: Insufficient documentation

## 2016-03-08 DIAGNOSIS — R399 Unspecified symptoms and signs involving the genitourinary system: Secondary | ICD-10-CM

## 2016-03-08 DIAGNOSIS — I513 Intracardiac thrombosis, not elsewhere classified: Secondary | ICD-10-CM

## 2016-03-08 DIAGNOSIS — I213 ST elevation (STEMI) myocardial infarction of unspecified site: Secondary | ICD-10-CM | POA: Insufficient documentation

## 2016-03-08 DIAGNOSIS — Z9889 Other specified postprocedural states: Secondary | ICD-10-CM | POA: Insufficient documentation

## 2016-03-08 DIAGNOSIS — Z7982 Long term (current) use of aspirin: Secondary | ICD-10-CM | POA: Insufficient documentation

## 2016-03-08 DIAGNOSIS — I2510 Atherosclerotic heart disease of native coronary artery without angina pectoris: Secondary | ICD-10-CM

## 2016-03-08 DIAGNOSIS — N390 Urinary tract infection, site not specified: Secondary | ICD-10-CM | POA: Insufficient documentation

## 2016-03-08 DIAGNOSIS — H1013 Acute atopic conjunctivitis, bilateral: Secondary | ICD-10-CM | POA: Insufficient documentation

## 2016-03-08 LAB — COMPLETE METABOLIC PANEL WITH GFR
ALBUMIN: 4.1 g/dL (ref 3.6–5.1)
ALK PHOS: 95 U/L (ref 40–115)
ALT: 23 U/L (ref 9–46)
AST: 17 U/L (ref 10–35)
BILIRUBIN TOTAL: 0.3 mg/dL (ref 0.2–1.2)
BUN: 12 mg/dL (ref 7–25)
CO2: 24 mmol/L (ref 20–31)
Calcium: 8.9 mg/dL (ref 8.6–10.3)
Chloride: 105 mmol/L (ref 98–110)
Creat: 1.05 mg/dL (ref 0.70–1.33)
GFR, Est African American: >89 mL/min (ref >=60)
GFR, Est Non African American: 79 mL/min (ref >=60)
GLUCOSE: 110 mg/dL — AB (ref 65–99)
Potassium: 4.6 mmol/L (ref 3.5–5.3)
SODIUM: 138 mmol/L (ref 135–146)
TOTAL PROTEIN: 7.1 g/dL (ref 6.1–8.1)

## 2016-03-08 LAB — LIPID PANEL
Cholesterol: 113 mg/dL — ABNORMAL LOW (ref 125–200)
HDL: 23 mg/dL — ABNORMAL LOW (ref >=40)
LDL Cholesterol: 44 mg/dL (ref <130)
Total CHOL/HDL Ratio: 4.9 Ratio (ref <=5.0)
Triglycerides: 232 mg/dL — ABNORMAL HIGH (ref <150)
VLDL: 46 mg/dL — ABNORMAL HIGH (ref <30)

## 2016-03-08 MED ORDER — OLOPATADINE HCL 0.1 % OP SOLN: 1.0000 [drp] | Freq: Two times a day (BID) | OPHTHALMIC | 0 refills | Status: DC

## 2016-03-08 MED ORDER — CLOPIDOGREL BISULFATE 75 MG PO TABS: 75.0000 mg | ORAL_TABLET | Freq: Every day | ORAL | 3 refills | Status: DC

## 2016-03-08 MED ORDER — HYDROXYZINE HCL 25 MG PO TABS: 25.0000 mg | ORAL_TABLET | Freq: Three times a day (TID) | ORAL | 3 refills | Status: DC | PRN

## 2016-03-08 MED ORDER — ATORVASTATIN CALCIUM 80 MG PO TABS: 80.0000 mg | ORAL_TABLET | Freq: Every day | ORAL | 0 refills | Status: DC

## 2016-03-08 MED ORDER — FAMOTIDINE 20 MG PO TABS
20.0000 mg | ORAL_TABLET | Freq: Two times a day (BID) | ORAL | 0 refills | Status: DC
Start: 2016-03-08 — End: 2016-05-28

## 2016-03-08 MED ORDER — LISINOPRIL 2.5 MG PO TABS: 2.5000 mg | ORAL_TABLET | Freq: Every day | ORAL | 3 refills | Status: DC

## 2016-03-08 MED ORDER — METOPROLOL TARTRATE 25 MG PO TABS: 25.0000 mg | ORAL_TABLET | Freq: Two times a day (BID) | ORAL | 3 refills | Status: DC

## 2016-03-08 MED ORDER — SERTRALINE HCL 50 MG PO TABS: 50.0000 mg | ORAL_TABLET | Freq: Every day | ORAL | 3 refills | Status: DC

## 2016-03-08 MED ORDER — TAMSULOSIN HCL 0.4 MG PO CAPS: 0.4000 mg | ORAL_CAPSULE | Freq: Every day | ORAL | 3 refills | Status: DC

## 2016-03-08 NOTE — Progress Notes (Signed)
Feels like he needs something more for his anxiety and depression Recently feels like he doesn't want to leave his house Bilateral knee pain- wants to talk about medications Itchy eyes- would like to discuss drops Fasting today if labs are needed Took first shower this morning in "2-3 months" Suicidal Ideation-"thinks about it daily" Medication refills

## 2016-03-08 NOTE — Patient Instructions (Signed)
Major Depressive Disorder Major depressive disorder is a mental illness. It also may be called clinical depression or unipolar depression. Major depressive disorder usually causes feelings of sadness, hopelessness, or helplessness. Some people with this disorder do not feel particularly sad but lose interest in doing things they used to enjoy (anhedonia). Major depressive disorder also can cause physical symptoms. It can interfere with work, school, relationships, and other normal everyday activities. The disorder varies in severity but is longer lasting and more serious than the sadness we all feel from time to time in our lives. Major depressive disorder often is triggered by stressful life events or major life changes. Examples of these triggers include divorce, loss of your job or home, a move, and the death of a family member or close friend. Sometimes this disorder occurs for no obvious reason at all. People who have family members with major depressive disorder or bipolar disorder are at higher risk for developing this disorder, with or without life stressors. Major depressive disorder can occur at any age. It may occur just once in your life (single episode major depressive disorder). It may occur multiple times (recurrent major depressive disorder). SYMPTOMS People with major depressive disorder have either anhedonia or depressed mood on nearly a daily basis for at least 2 weeks or longer. Symptoms of depressed mood include:  Feelings of sadness (blue or down in the dumps) or emptiness.  Feelings of hopelessness or helplessness.  Tearfulness or episodes of crying (may be observed by others).  Irritability (children and adolescents). In addition to depressed mood or anhedonia or both, people with this disorder have at least four of the following symptoms:  Difficulty sleeping or sleeping too much.   Significant change (increase or decrease) in appetite or weight.   Lack of energy or  motivation.  Feelings of guilt and worthlessness.   Difficulty concentrating, remembering, or making decisions.  Unusually slow movement (psychomotor retardation) or restlessness (as observed by others).   Recurrent wishes for death, recurrent thoughts of self-harm (suicide), or a suicide attempt. People with major depressive disorder commonly have persistent negative thoughts about themselves, other people, and the world. People with severe major depressive disorder may experiencedistorted beliefs or perceptions about the world (psychotic delusions). They also may see or hear things that are not real (psychotic hallucinations). DIAGNOSIS Major depressive disorder is diagnosed through an assessment by your health care provider. Your health care provider will ask aboutaspects of your daily life, such as mood,sleep, and appetite, to see if you have the diagnostic symptoms of major depressive disorder. Your health care provider may ask about your medical history and use of alcohol or drugs, including prescription medicines. Your health care provider also may do a physical exam and blood work. This is because certain medical conditions and the use of certain substances can cause major depressive disorder-like symptoms (secondary depression). Your health care provider also may refer you to a mental health specialist for further evaluation and treatment. TREATMENT It is important to recognize the symptoms of major depressive disorder and seek treatment. The following treatments can be prescribed for this disorder:   Medicine. Antidepressant medicines usually are prescribed. Antidepressant medicines are thought to correct chemical imbalances in the brain that are commonly associated with major depressive disorder. Other types of medicine may be added if the symptoms do not respond to antidepressant medicines alone or if psychotic delusions or hallucinations occur.  Talk therapy. Talk therapy can be  helpful in treating major depressive disorder by providing   support, education, and guidance. Certain types of talk therapy also can help with negative thinking (cognitive behavioral therapy) and with relationship issues that trigger this disorder (interpersonal therapy). A mental health specialist can help determine which treatment is best for you. Most people with major depressive disorder do well with a combination of medicine and talk therapy. Treatments involving electrical stimulation of the brain can be used in situations with extremely severe symptoms or when medicine and talk therapy do not work over time. These treatments include electroconvulsive therapy, transcranial magnetic stimulation, and vagal nerve stimulation.   This information is not intended to replace advice given to you by your health care provider. Make sure you discuss any questions you have with your health care provider.   Document Released: 11/23/2012 Document Revised: 08/19/2014 Document Reviewed: 11/23/2012 Elsevier Interactive Patient Education 2016 [employer]  

## 2016-03-08 NOTE — Progress Notes (Signed)
Subjective:  Patient ID: Henry David, male    DOB: 07-Mar-1961  Age: 56 y.o. MRN: [id number]  CC: Depression (trouble sleeping) and Anxiety   HPI Henry David presents for a follow-up of depression and anxiety. Medical history is significant for GERD, coronary artery disease status post PCI with DES to LAD and RCA in 06/2015 (currently on Plavix), depression and anxiety, left apical mural thrombus diagnosed in 12/2015 (currently on anticoagulation on Coumadin) He remains on Zoloft, BuSpar and hydroxyzine and reports that his symptoms of depression and anxiety are worse and he has continually refused to seek help at [facility]. He still lacks some motivation to get up and move or shower and has had suicidal thoughts in the past but none at this time.   He remains on atorvastatin for hyperlipidemia . He does not have any chest pains or shortness of breath at this time and has no reduced exercise tolerance. Does have occasional knee pains for which she takes Tylenol. Complains of itchy eyes and tearing from both eyes but no respiratory symptoms.  Past Medical History:  Diagnosis Date  . Anxiety   . Apical mural thrombus (HCC)   . BPH (benign prostatic hyperplasia)   . Depression   . Diverticulitis   . Elevated cholesterol   . Essential hypertension   . Post traumatic stress disorder   . STEMI (ST elevation myocardial infarction) [facility])     Past Surgical History:  Procedure Laterality Date  . CARDIAC CATHETERIZATION N/A 07/09/2015   Procedure: Left Heart Cath and Coronary Angiography;  Surgeon: Lennette Bihari, MD;  Location: [facility] INVASIVE CV LAB;  Service: Cardiovascular;  Laterality: N/A;  . CARDIAC CATHETERIZATION N/A 07/09/2015   Procedure: Coronary Stent Intervention;  Surgeon: Lennette Bihari, MD;  Location: [facility] INVASIVE CV LAB;  Service: Cardiovascular;  Laterality: N/A;  . CARDIAC CATHETERIZATION N/A 07/11/2015   Procedure: Coronary Stent Intervention;  Surgeon: Lennette Bihari,  MD;  Location: [facility] INVASIVE CV LAB;  Service: Cardiovascular;  Laterality: N/A;  . KNEE SURGERY      Allergies  Allergen Reactions  . Coconut Fatty Acids     unknown     Outpatient Medications Prior to Visit  Medication Sig Dispense Refill  . aspirin 81 MG EC tablet Take 1 tablet (81 mg total) by mouth daily. 30 tablet 0  . buPROPion (WELLBUTRIN SR) 150 MG 12 hr tablet TAKE ONE TABLET BY MOUTH TWICE DAILY 60 tablet 2  . cyclobenzaprine (FLEXERIL) 10 MG tablet TAKE 1 TABLET BY MOUTH 3 TIMES DAILY AS NEEDED FOR MUSCLE SPASMS. 30 tablet 0  . fluticasone (FLONASE) 50 MCG/ACT nasal spray PLACE 2 SPRAYS INTO BOTH NOSTRILS DAILY. 16 g 3  . NITROSTAT 0.4 MG SL tablet PLACE 1 TABLET UNDER THE TONGUE EVERY 5 MINUTES AS NEEDED FOR CHEST PAIN. MAX-3 DOSES WITHIN A 15 MINUTE INTERVAL 25 tablet 0  . warfarin (COUMADIN) 5 MG tablet Take 1/2 - 1 tablet daily or as directed by coumadin clinic 30 tablet 0  . atorvastatin (LIPITOR) 80 MG tablet Take 1 tablet (80 mg total) by mouth daily at 6 PM. Must have office visit 30 tablet 0  . clopidogrel (PLAVIX) 75 MG tablet Take 1 tablet (75 mg total) by mouth daily. 90 tablet 3  . famotidine (PEPCID) 20 MG tablet Take 1 tablet (20 mg total) by mouth 2 (two) times daily. Must have office visit 60 tablet 0  . hydrOXYzine (ATARAX/VISTARIL) 25 MG tablet TAKE 1 TABLET BY MOUTH  3 TIMES DAILY AS NEEDED. 60 tablet 0  . lisinopril (PRINIVIL,ZESTRIL) 2.5 MG tablet Take 1 tablet (2.5 mg total) by mouth at bedtime. 30 tablet 3  . metoprolol tartrate (LOPRESSOR) 25 MG tablet Take 1 tablet (25 mg total) by mouth 2 (two) times daily. Must have office visit 60 tablet 0  . sertraline (ZOLOFT) 50 MG tablet Take 1 tablet (50 mg total) by mouth daily. Must have office visit 30 tablet 0  . tamsulosin (FLOMAX) 0.4 MG CAPS capsule Take 1 capsule (0.4 mg total) by mouth daily. Must have office visit 30 capsule 0  . acetaminophen (TYLENOL) 500 MG tablet Take 500 mg by mouth every 6 (six)  hours as needed.    . ALPRAZolam (XANAX) 0.5 MG tablet Take 1 tablet (0.5 mg total) by mouth daily as needed for anxiety. (Patient not taking: Reported on 03/08/2016) 20 tablet 0   No facility-administered medications prior to visit.     ROS Review of Systems  Musculoskeletal:       Knee pain   Constitutional: Negative for activity change and appetite change.  HENT: Negative for sinus pressure and sore throat.   Eyes: Negative for visual disturbance.  positive for itchy eyes Respiratory: Negative for cough, chest tightness and shortness of breath.   Cardiovascular: Negative for chest pain and leg swelling.  Gastrointestinal: Negative for abdominal pain, diarrhea, constipation and abdominal distention.  Endocrine: Negative.   Genitourinary: Negative for dysuria.  Musculoskeletal: Negative for myalgias and joint swelling.  Skin: Negative for rash.  Allergic/Immunologic: Negative.   Neurological: Negative for weakness, light-headedness and numbness.  Psychiatric/Behavioral: Positive for dysphoric mood. Negative for suicidal ideas.   Objective:  BP 107/70 (BP Location: Left Arm, Patient Position: Sitting, Cuff Size: Large)   Pulse 62   Temp 98.2 F (36.8 C) (Oral)   Ht 5' 8.6" (1.742 m)   Wt 226 lb 3.2 oz (102.6 kg)   SpO2 (!) 63%   BMI 33.79 kg/m   BP/Weight 03/08/2016 01/12/2016 12/31/2015  Systolic BP 107 112 100  Diastolic BP 70 74 53  Wt. (Lbs) 226.2 225 -  BMI 33.79 32.28 -      Physical Exam Constitutional: He is oriented to person, place, and time. He appears well-developed and well-nourished.  Cardiovascular: Normal rate, normal heart sounds and intact distal pulses.   No murmur heard. Pulmonary/Chest: Effort normal and breath sounds normal. He has no wheezes. He has no rales. He exhibits no tenderness.  Abdominal: Soft. Bowel sounds are normal. He exhibits no distension and no mass. There is no tenderness.  Musculoskeletal: Normal range of motion.  Neurological:  He is alert and oriented to person, place, and time.  Skin: Skin is warm and dry.  Psychiatric: He has a depressed mood   CMP Latest Ref Rng & Units 12/30/2015 12/29/2015 09/27/2015  Glucose 65 - 99 mg/dL 161(W) 960(A) 540(J)  BUN 6 - 20 mg/dL 19 81(X) 18  Creatinine 0.61 - 1.24 mg/dL 9.14 7.82 9.56  Sodium 135 - 145 mmol/L 137 137 140  Potassium 3.5 - 5.1 mmol/L 3.9 4.0 4.8  Chloride 101 - 111 mmol/L 105 106 104  CO2 22 - 32 mmol/L Calcium 8.9 - 10.3 mg/dL 9.0 9.1 9.2  Total Protein 6.5 - 8.1 g/dL - 7.8 7.3  Total Bilirubin 0.3 - 1.2 mg/dL - 0.8 0.5  Alkaline Phos 38 - 126 U/L - 87 100  AST 15 - 41 U/L - 25 25  ALT 17 -  63 U/L - 33 39    Lipid Panel     Component Value Date/Time   CHOL 136 12/29/2015 0350   TRIG 485 (H) 12/29/2015 0350   HDL 22 (L) 12/29/2015 0350   CHOLHDL 6.2 12/29/2015 0350   VLDL UNABLE TO CALCULATE IF TRIGLYCERIDE OVER 400 mg/dL 14/97/0263 7858   LDLCALC UNABLE TO CALCULATE IF TRIGLYCERIDE OVER 400 mg/dL 85/09/7739 2878   LDLDIRECT 103.1 05/22/2007 0922    Assessment & Plan:   1. CAD S/P LAD and RCA DES Stable - atorvastatin (LIPITOR) 80 MG tablet; Take 1 tablet (80 mg total) by mouth daily at 6 PM. Must have office visit  Dispense: 30 tablet; Refill: 0 - clopidogrel (PLAVIX) 75 MG tablet; Take 1 tablet (75 mg total) by mouth daily.  Dispense: 90 tablet; Refill: 3 - lisinopril (PRINIVIL,ZESTRIL) 2.5 MG tablet; Take 1 tablet (2.5 mg total) by mouth at bedtime.  Dispense: 30 tablet; Refill: 3 - metoprolol tartrate (LOPRESSOR) 25 MG tablet; Take 1 tablet (25 mg total) by mouth 2 (two) times daily.  Dispense: 60 tablet; Refill: 3 - Lipid panel - COMPLETE METABOLIC PANEL WITH GFR  2. Lower urinary tract symptoms Controlled - tamsulosin (FLOMAX) 0.4 MG CAPS capsule; Take 1 capsule (0.4 mg total) by mouth daily.  Dispense: 30 capsule; Refill: 3  3. Gastroesophageal reflux disease without esophagitis Controlled - famotidine (PEPCID) 20 MG  tablet; Take 1 tablet (20 mg total) by mouth 2 (two) times daily. Must have office visit  Dispense: 60 tablet; Refill: 0  4. Apical mural thrombus (HCC) Currently on anticoagulation with Coumadin and is followed by the Coumadin clinic  5. Allergic conjunctivitis, bilateral - olopatadine (PATANOL) 0.1 % ophthalmic solution; Place 1 drop into both eyes 2 (two) times daily.  Dispense: 3 mL; Refill: 0  6. Depression with anxiety Patient has been seen on multiple occasions here at the clinic by both myself and licensed clinical social worker and he has been encouraged to walk into [facility] to seek psychiatry help given current medications are not helping. He is not motivated to do so however he is able to keep his appointments at the Coumadin clinic. Not suicidal at this time; he has promised to go to [place] soon as he leaves this appointment; on interacting with him he appears jovial. - hydrOXYzine (ATARAX/VISTARIL) 25 MG tablet; Take 1 tablet (25 mg total) by mouth 3 (three) times daily as needed.  Dispense: 60 tablet; Refill: 3 - sertraline (ZOLOFT) 50 MG tablet; Take 1 tablet (50 mg total) by mouth daily.  Dispense: 30 tablet; Refill: 3   Meds ordered this encounter  Medications  . atorvastatin (LIPITOR) 80 MG tablet    Sig: Take 1 tablet (80 mg total) by mouth daily at 6 PM. Must have office visit    Dispense:  30 tablet    Refill:  0  . clopidogrel (PLAVIX) 75 MG tablet    Sig: Take 1 tablet (75 mg total) by mouth daily.    Dispense:  90 tablet    Refill:  3  . hydrOXYzine (ATARAX/VISTARIL) 25 MG tablet    Sig: Take 1 tablet (25 mg total) by mouth 3 (three) times daily as needed.    Dispense:  60 tablet    Refill:  3  . lisinopril (PRINIVIL,ZESTRIL) 2.5 MG tablet    Sig: Take 1 tablet (2.5 mg total) by mouth at bedtime.    Dispense:  30 tablet    Refill:  3  . metoprolol tartrate (LOPRESSOR)  25 MG tablet    Sig: Take 1 tablet (25 mg total) by mouth 2 (two) times daily.     Dispense:  60 tablet    Refill:  3  . sertraline (ZOLOFT) 50 MG tablet    Sig: Take 1 tablet (50 mg total) by mouth daily.    Dispense:  30 tablet    Refill:  3  . tamsulosin (FLOMAX) 0.4 MG CAPS capsule    Sig: Take 1 capsule (0.4 mg total) by mouth daily.    Dispense:  30 capsule    Refill:  3  . famotidine (PEPCID) 20 MG tablet    Sig: Take 1 tablet (20 mg total) by mouth 2 (two) times daily. Must have office visit    Dispense:  60 tablet    Refill:  0  . olopatadine (PATANOL) 0.1 % ophthalmic solution    Sig: Place 1 drop into both eyes 2 (two) times daily.    Dispense:  3 mL    Refill:  0    Follow-up: Return in about 3 months (around 06/08/2016) for Follow-up on chronic medical conditions.   Jaclyn Shaggy MD

## 2016-03-13 ENCOUNTER — Ambulatory Visit (INDEPENDENT_AMBULATORY_CARE_PROVIDER_SITE_OTHER): Payer: Self-pay | Admitting: Pharmacist Clinician (PhC)/ Clinical Pharmacy Specialist

## 2016-03-13 ENCOUNTER — Telehealth: Payer: Self-pay

## 2016-03-13 DIAGNOSIS — I213 ST elevation (STEMI) myocardial infarction of unspecified site: Secondary | ICD-10-CM

## 2016-03-13 DIAGNOSIS — Z7901 Long term (current) use of anticoagulants: Secondary | ICD-10-CM

## 2016-03-13 DIAGNOSIS — I513 Intracardiac thrombosis, not elsewhere classified: Secondary | ICD-10-CM

## 2016-03-13 LAB — POCT INR: INR: 1.4

## 2016-03-13 NOTE — Telephone Encounter (Signed)
Contacted pt to go over lab results pt is aware of results and doesn't have any questions or concerns 

## 2016-03-15 ENCOUNTER — Telehealth: Payer: Self-pay

## 2016-03-15 NOTE — Telephone Encounter (Signed)
-----   Message from Jaclyn Shaggy, MD sent at 03/11/2016  1:29 PM EDT ----- Mildly elevated triglycerides; advised to take over-the-counter Fish

## 2016-03-15 NOTE — Telephone Encounter (Signed)
Writer called patient to discuss lab test results per Dr. Venetia Night.  LVM requesting patient to call back on provided number to discuss.

## 2016-03-18 ENCOUNTER — Telehealth: Payer: Self-pay

## 2016-03-18 NOTE — Telephone Encounter (Signed)
Writer was able to touch base with patient in regards to his lab results per Dr. Jen Mow request. Patient's labs were within normal limits with the exception of his triglycerides being mildly elevated.  Recommendation for patient to take fish oil daily was suggested per Dr. Venetia Night.  Patient stated understanding.

## 2016-03-18 NOTE — Telephone Encounter (Signed)
-----   Message from Enobong Amao, MD sent at 03/11/2016  1:29 PM EDT ----- Mildly elevated triglycerides; advised to take over-the-counter Fish 

## 2016-03-26 ENCOUNTER — Other Ambulatory Visit: Payer: Self-pay | Admitting: Family Medicine

## 2016-03-26 ENCOUNTER — Other Ambulatory Visit: Payer: Self-pay | Admitting: Cardiovascular Disease

## 2016-03-26 MED FILL — ?TAMSULOSIN HCL 0.4 MG CAP: 0.4 | 30 days supply | Qty: 30 | Fill #3

## 2016-03-26 MED FILL — BUPROPION HCL SR 150 MG TAB: 150 | 30 days supply | Qty: 60 | Fill #1

## 2016-03-26 MED FILL — ?FAMOTIDINE 20 MG TABLET: 20 | 30 days supply | Qty: 60 | Fill #3

## 2016-03-26 MED FILL — ?METOPROLOL 25 MG TABLET: 25 | 30 days supply | Qty: 60 | Fill #2

## 2016-03-26 MED FILL — LISINOPRIL 2.5 MG TABLET: 2.5 | 30 days supply | Qty: 30 | Fill #0

## 2016-03-26 MED FILL — FLUTICASONE PROP 50 MCG SPR: 50 | 30 days supply | Qty: 16 | Fill #1

## 2016-03-26 MED FILL — ?SERTRALINE HCL 50 MG TABLE: 50 | 30 days supply | Qty: 30 | Fill #2

## 2016-03-26 MED FILL — hydrOXYzine HCL 25 MG TABS: 25 | 20 days supply | Qty: 60 | Fill #0

## 2016-03-26 MED FILL — ATORVASTATIN 80 MG TABLET: 80 | 30 days supply | Qty: 30 | Fill #3

## 2016-03-27 MED FILL — NITROSTAT 0.4 MG TABLET SL: 0.4 | 25 days supply | Qty: 25 | Fill #0

## 2016-03-27 MED FILL — ?CYCLOBENZAPRINE 10 MG TABL: 10 | 10 days supply | Qty: 30 | Fill #0

## 2016-03-28 ENCOUNTER — Ambulatory Visit (INDEPENDENT_AMBULATORY_CARE_PROVIDER_SITE_OTHER): Payer: Self-pay | Admitting: Pharmacist

## 2016-03-28 DIAGNOSIS — I213 ST elevation (STEMI) myocardial infarction of unspecified site: Secondary | ICD-10-CM

## 2016-03-28 DIAGNOSIS — I513 Intracardiac thrombosis, not elsewhere classified: Secondary | ICD-10-CM

## 2016-03-28 DIAGNOSIS — Z7901 Long term (current) use of anticoagulants: Secondary | ICD-10-CM

## 2016-03-28 LAB — POCT INR: INR: 2.2

## 2016-03-28 MED FILL — WARFARIN SODIUM 5 MG TABLET: 5 | 35 days supply | Qty: 35 | Fill #0

## 2016-04-03 MED FILL — ?OLOPATADINE HCL 0.1% EYE D: 0.1% | 5 days supply | Qty: 5 | Fill #0

## 2016-04-03 MED FILL — CLOPIDOGREL 75 MG TABLET: 75 | 30 days supply | Qty: 30 | Fill #0

## 2016-04-18 ENCOUNTER — Telehealth: Payer: Self-pay | Admitting: Cardiovascular Disease

## 2016-04-18 ENCOUNTER — Telehealth: Payer: Self-pay | Admitting: Family Medicine

## 2016-04-18 NOTE — Telephone Encounter (Signed)
Pt calling stating he was carrying a microwave and lost his balance, falling forward onto the microwave then the ground Pt states the microwave hit his ribs hard Pt is concerned since he is on blood thinners and wants to know if he needs on OV Pt would like to speak with nurse

## 2016-04-18 NOTE — Telephone Encounter (Signed)
New message       Pt was walking and carrying a microwave oven.  He fell with the microwave in his hands.  Pt is on coumadin.  Should he go to the ER because he is on coumadin?

## 2016-04-18 NOTE — Telephone Encounter (Signed)
Received call from patient-patient reports he was carrying a microwave for a neighbor and fell causing him to land on top of the microwave hitting his chest/rib area.  Pt reports pain to BL chest/ribs. Denies SOB/bruising at this time.  Pt is on Coumadin.  Pt reports he also has coumadin check at 3pm but did not know whether to come, get checked out, or what to do.     Advised to go to [facility] to get evaluated-advised I would cancel appt for coumadin check and we could reschedule.  Made Nicholaus Bloom pharmacist aware to reschedule coumadin check.   Pt verbalized understanding.

## 2016-04-19 NOTE — Telephone Encounter (Signed)
Please find out what his symptoms are. That will determine if he needs an ED or office visit.

## 2016-04-24 NOTE — Telephone Encounter (Signed)
Writer has tried to reach this patient several times.  Today Clinical research associate called patient four times and was unable to hear the patient speak after he answered the phone.

## 2016-04-24 NOTE — Telephone Encounter (Signed)
Letter sent to patient today.

## 2016-04-29 ENCOUNTER — Other Ambulatory Visit: Payer: Self-pay | Admitting: Cardiovascular Disease

## 2016-04-29 ENCOUNTER — Other Ambulatory Visit: Payer: Self-pay | Admitting: Family Medicine

## 2016-04-29 MED FILL — LISINOPRIL 2.5 MG TABLET: 2.5 | 30 days supply | Qty: 30 | Fill #1

## 2016-04-29 MED FILL — OLOPATADINE HCL 0.1% EYE DR: 0.1 | 5 days supply | Qty: 5 | Fill #1

## 2016-04-29 MED FILL — FAMOTIDINE 20 MG TABLET: 20 | 30 days supply | Qty: 60 | Fill #0

## 2016-04-29 MED FILL — ATORVASTATIN 80 MG TABLET: 80 | 30 days supply | Qty: 30 | Fill #0

## 2016-04-29 MED FILL — BUPROPION SR 150 MG TABLET: 150 | 30 days supply | Qty: 60 | Fill #2

## 2016-04-29 MED FILL — FLUTICASONE PROP 50 MCG SPR: 50 | 30 days supply | Qty: 16 | Fill #2

## 2016-04-29 MED FILL — TAMSULOSIN HCL 0.4 MG CAP: 0.4 | 30 days supply | Qty: 30 | Fill #0

## 2016-04-29 MED FILL — CLOPIDOGREL 75 MG TABLET: 75 | 30 days supply | Qty: 30 | Fill #1

## 2016-04-29 MED FILL — ?METOPROLOL 25 MG TABLET: 25 | 30 days supply | Qty: 60 | Fill #3

## 2016-04-29 MED FILL — ?HYDROXYZINE HCL 25 MG TAB: 25 | 20 days supply | Qty: 60 | Fill #1

## 2016-04-29 MED FILL — ?SERTRALINE HCL 50 MG TABLE: 50 | 30 days supply | Qty: 30 | Fill #3

## 2016-05-01 MED FILL — NITROSTAT 0.4 MG TABLET SL: 0.4 | 25 days supply | Qty: 25 | Fill #0

## 2016-05-01 MED FILL — ?CYCLOBENZAPRINE 10 MG TABL: 10 | 10 days supply | Qty: 30 | Fill #0

## 2016-05-01 MED FILL — WARFARIN SODIUM 5 MG TABLET: 5 | 35 days supply | Qty: 35 | Fill #0

## 2016-05-08 ENCOUNTER — Ambulatory Visit (INDEPENDENT_AMBULATORY_CARE_PROVIDER_SITE_OTHER): Payer: Self-pay | Admitting: Pharmacist Clinician (PhC)/ Clinical Pharmacy Specialist

## 2016-05-08 ENCOUNTER — Ambulatory Visit (INDEPENDENT_AMBULATORY_CARE_PROVIDER_SITE_OTHER): Payer: Self-pay | Admitting: Cardiovascular Disease

## 2016-05-08 ENCOUNTER — Encounter: Payer: Self-pay | Admitting: Cardiovascular Disease

## 2016-05-08 VITALS — BP 97/71 | HR 63 | Ht 68.0 in | Wt 233.4 lb

## 2016-05-08 DIAGNOSIS — I213 ST elevation (STEMI) myocardial infarction of unspecified site: Secondary | ICD-10-CM

## 2016-05-08 DIAGNOSIS — Z7901 Long term (current) use of anticoagulants: Secondary | ICD-10-CM

## 2016-05-08 DIAGNOSIS — I2510 Atherosclerotic heart disease of native coronary artery without angina pectoris: Secondary | ICD-10-CM

## 2016-05-08 DIAGNOSIS — I10 Essential (primary) hypertension: Secondary | ICD-10-CM

## 2016-05-08 DIAGNOSIS — I513 Intracardiac thrombosis, not elsewhere classified: Secondary | ICD-10-CM

## 2016-05-08 DIAGNOSIS — I255 Ischemic cardiomyopathy: Secondary | ICD-10-CM

## 2016-05-08 DIAGNOSIS — Z9861 Coronary angioplasty status: Secondary | ICD-10-CM

## 2016-05-08 DIAGNOSIS — E785 Hyperlipidemia, unspecified: Secondary | ICD-10-CM

## 2016-05-08 LAB — POCT INR: INR: 3.9

## 2016-05-08 NOTE — Patient Instructions (Signed)
Schedule Lexiscan Myoview in November  Schedule Echo in November  Your physician recommends that you schedule a follow-up appointment in: December.

## 2016-05-10 NOTE — Progress Notes (Signed)
Patient ID: Henry David, male   DOB: 08-Jun-1961, 55 y.o.   MRN: [id number]      PCP: Dr. Arnoldo Morale  HPI: Henry David is a 55 y.o. male who presents to the office today for a 3 month  follow up cardiology evaluation.  Mr Schill has a history of anxiety , depression and stress disorder.  He had not seen a physician in years in the past was told of having high cholesterol but never received treatment. On July 09, 2015 he developed new onset chest pain leading to a [facility] emergency room presentation.  His ECG several hours after presentation revealed anterolateral ST elevation and he was transported to [facility] for emergent catheterization and PCI. Catheterization was performed by me, which revealed mild acute LV dysfunction with mid anterolateral hypocontractility and an EF of 45-50%.  There was significant multivessel CAD with the LAD giving rise to a bifurcating diagonal vessel with tubular narrowing of 75-80% and after the takeoff of the proximal ulcerative perforating artery the LAD had a 95% napkin ring stenosis followed by 60% stenosis in its midsegment. The left circumflex coronary artery was felt to have an 80% eccentric narrowing in the first marginal branch. The RCA was a large dominant vessel with diffuse 80% proximal stenosis.  He underwent successful intervention with PTCA stenting of the mid LAD with insertion of a 2.7518 mm Xience Alpine stent postdilated to 2.97 mm in the 100% occlusion reduced to 0%, and PTCA of his diagonal stenosis being reduced to approximate 5%. 2 days later he underwent repeat catheterization which showed patent intervention sites.  His circumflex marginal stenosis had improved and now only appeared 30%. His RCA stenosis appeared 90% and he underwent successful stenting of the RCA  with insertion of a 4.018 mm Xience Alpine DES stent postdilated to 4.51 mm with the stenoses being reduced to 0% in the staged procedure.   When I saw him in  February 2017 he remained stable without recurrent chest pain development.  He was on  aspirin and Brilinta 90 mg twice a day for dual antiplatelet therapy.  He has been on metoprolol, tartrate 25 mg twice a day and low-dose lisinopril 2.5 mg in the morning for CAD.  He has had issues with low blood pressure and had been off lisinopril.  I suggested since his blood pressure was stable of rechallenge of low-dose therapy..  For his anxiety. He was on Wellbutrin 150 mg twice a day.  He was tolerating atorvastatin 80 mg for his hyperlipidemia.  He denied myalgias.    He underwent an echo study on 12/26/2015 which showed an EF of 40-45%.  There was akinesis of the anteroseptal and apical myocardium and he had grade 1 diastolic dysfunction.  Aortic root was mildly dilated.  There appeared to be an apical thrombus on definity evaluation.  He was hospitalized on 12/29/2015 with palpitations and increased anxiety.  During that evaluation, he was started on Coumadin in light of his apical thrombus.   When I saw him in follow-up, since he was started on Coumadin, I discontinued Brilinta and started Plavix.  Over the past several months, he has felt well.  He has quit smoking.  He denies recurrent chest pain.  He is unaware of any bleeding.  He denies PND, orthopnea.  He presents for reevaluation.  Past Medical History:  Diagnosis Date  . Anxiety   . 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 BPH (benign prostatic hyperplasia)   .  Depression   . Diverticulitis   . Elevated cholesterol   . Essential hypertension   . Post traumatic stress disorder   . STEMI (ST elevation myocardial infarction) [facility])     Past Surgical History:  Procedure Laterality Date  . CARDIAC CATHETERIZATION N/A 07/09/2015   Procedure: Left Heart Cath and Coronary Angiography;  Surgeon: Troy Sine, MD;  Location: [place] CV LAB;  Service: Cardiovascular;  Laterality: N/A;  . CARDIAC CATHETERIZATION N/A 07/09/2015   Procedure: Coronary  Stent Intervention;  Surgeon: Troy Sine, MD;  Location: [place] CV LAB;  Service: Cardiovascular;  Laterality: N/A;  . CARDIAC CATHETERIZATION N/A 07/11/2015   Procedure: Coronary Stent Intervention;  Surgeon: Troy Sine, MD;  Location: [place] CV LAB;  Service: Cardiovascular;  Laterality: N/A;  . KNEE SURGERY      Allergies  Allergen Reactions  . Coconut Fatty Acids     unknown    Current Outpatient Prescriptions  Medication Sig Dispense Refill  . acetaminophen (TYLENOL) 500 MG tablet Take 500 mg by mouth every 6 (six) hours as needed.    Marland Kitchen aspirin 81 MG EC tablet Take 1 tablet (81 mg total) by mouth daily. 30 tablet 0  . atorvastatin (LIPITOR) 80 MG tablet Take 1 tablet (80 mg total) by mouth daily at 6 PM. Must have office visit 30 tablet 0  . buPROPion (WELLBUTRIN SR) 150 MG 12 hr tablet TAKE ONE TABLET BY MOUTH TWICE DAILY 60 tablet 2  . clopidogrel (PLAVIX) 75 MG tablet Take 1 tablet (75 mg total) by mouth daily. 90 tablet 3  . cyclobenzaprine (FLEXERIL) 10 MG tablet TAKE 1 TABLET BY MOUTH 3 TIMES DAILY AS NEEDED FOR MUSCLE SPASMS. 30 tablet 0  . famotidine (PEPCID) 20 MG tablet Take 1 tablet (20 mg total) by mouth 2 (two) times daily. Must have office visit 60 tablet 0  . fluticasone (FLONASE) 50 MCG/ACT nasal spray PLACE 2 SPRAYS INTO BOTH NOSTRILS DAILY. 16 g 3  . hydrOXYzine (ATARAX/VISTARIL) 25 MG tablet Take 1 tablet (25 mg total) by mouth 3 (three) times daily as needed. 60 tablet 3  . lisinopril (PRINIVIL,ZESTRIL) 2.5 MG tablet Take 1 tablet (2.5 mg total) by mouth at bedtime. 30 tablet 3  . metoprolol tartrate (LOPRESSOR) 25 MG tablet Take 1 tablet (25 mg total) by mouth 2 (two) times daily. 60 tablet 3  . NITROSTAT 0.4 MG SL tablet PLACE 1 TABLET UNDER THE TONGUE EVERY 5 MINUTES AS NEEDED FOR CHEST PAIN. MAX-3 DOSES WITHIN A 15 MINUTE INTERVAL 25 tablet 0  . sertraline (ZOLOFT) 50 MG tablet Take 1 tablet (50 mg total) by mouth daily. 30 tablet 3  .  tamsulosin (FLOMAX) 0.4 MG CAPS capsule Take 1 capsule (0.4 mg total) by mouth daily. 30 capsule 3  . warfarin (COUMADIN) 5 MG tablet TAKE 1/2 TO 1 TABLET DAILY OR AS DIRECTED BY COUMADIN CLINIC 35 tablet 0   No current facility-administered medications for this visit.     Social History   Social History  . Marital status: Single    Spouse name: N/A  . Number of children: N/A  . Years of education: N/A   Occupational History  . Not on file.   Social History Main Topics  . Smoking status: Former Smoker    Packs/day: 0.00    Types: Cigarettes    Quit date: 07/04/2015  . Smokeless tobacco: Former Systems developer  . Alcohol use 2.4 oz/week    4 Cans of beer per  week     Comment: occassional use  . Drug use: No  . Sexual activity: Not Currently   Other Topics Concern  . Not on file   Social History Narrative  . No narrative on file    Family History  Problem Relation Age of Onset  . Heart disease Mother   . Heart disease Father     ROS General: Negative; No fevers, chills, or night sweats HEENT: Negative; No changes in vision or hearing, sinus congestion, difficulty swallowing Pulmonary: Negative; No cough, wheezing, shortness of breath, hemoptysis Cardiovascular: See HPI: No chest pain, presyncope, syncope, palpatations GI: Negative; No nausea, vomiting, diarrhea, or abdominal pain GU: Negative; No dysuria, hematuria, or difficulty voiding Musculoskeletal: Negative; no myalgias, joint pain, or weakness Hematologic: Negative; no easy bruising, bleeding Endocrine: Negative; no heat/cold intolerance; no diabetes, Neuro: Negative; no changes in balance, headaches Skin: Negative; No rashes or skin lesions Psychiatric: Negative; No behavioral problems, depression Sleep: Negative; No snoring,  daytime sleepiness, hypersomnolence, bruxism, restless legs, hypnogognic hallucinations. Other comprehensive 14 point system review is negative   Physical Exam BP 97/71 (BP Location: Right  Arm, Patient Position: Sitting, Cuff Size: Large)   Pulse 63   Ht 5' 8" (1.727 m)   Wt 233 lb 6.4 oz (105.9 kg)   SpO2 96%   BMI 35.49 kg/m  Wt Readings from Last 3 Encounters:  05/08/16 233 lb 6.4 oz (105.9 kg)  03/08/16 226 lb 3.2 oz (102.6 kg)  01/12/16 225 lb (102.1 kg)   General: Alert, oriented, no distress.  Skin: normal turgor, no rashes, warm and dry HEENT: Normocephalic, atraumatic. Pupils equal round and reactive to light; sclera anicteric; extraocular muscles intact, No lid lag; Nose without nasal septal hypertrophy; Mouth/Parynx benign; Mallinpatti scale 3 Neck: No JVD, no carotid bruits; normal carotid upstroke Lungs: clear to ausculatation and percussion bilaterally; no wheezing or rales, normal inspiratory and expiratory effort Chest wall: without tenderness to palpitation Heart: PMI not displaced, RRR, s1 s2 normal, 1/6 systolic murmur, No diastolic murmur, no rubs, gallops, thrills, or heaves Abdomen: soft, nontender; no hepatosplenomehaly, BS+; abdominal aorta nontender and not dilated by palpation. Back: no CVA tenderness Pulses: 2+  Musculoskeletal: full range of motion, normal strength, no joint deformities Extremities: Pulses 2+, no clubbing cyanosis or edema, Homan's sign negative  Neurologic: grossly nonfocal; Cranial nerves grossly wnl Psychologic: Normal mood and affect  ECG (independently read by me): Normal sinus rhythm at 60 bpm.  Low voltage.  No ST segment changes.  Normal intervals.  February 2017 ECG (independently read by me):  Normal sinus rhythm at 70 bpm. Nonspecific T abnormality in lead 3. Poor progression V1 through V3.Marland Kitchen  Normal intervals.  LABS:  BMP Latest Ref Rng & Units 03/08/2016 12/30/2015 12/29/2015  Glucose 65 - 99 mg/dL 110(H) 110(H) 105(H)  BUN 7 - 25 mg/dL 12 19 21(H)  Creatinine 0.70 - 1.33 mg/dL 1.05 0.97 1.08  Sodium 135 - 146 mmol/L 138 137 137  Potassium 3.5 - 5.3 mmol/L 4.6 3.9 4.0  Chloride 98 - 110 mmol/L 105 105 106    CO2 20 - 31 mmol/L _0 Calcium 8.6 - 10.3 mg/dL 8.9 9.0 9.1     Hepatic Function Latest Ref Rng & Units 03/08/2016 12/29/2015 09/27/2015  Total Protein 6.1 - 8.1 g/dL 7.1 7.8 7.3  Albumin 3.6 - 5.1 g/dL 4.1 4.4 4.3  AST 10 - 35 U/L _1 ALT 9 - 46 U/L 23 33 39  Alk  Phosphatase 40 - 115 U/L 95 87 100  Total Bilirubin 0.2 - 1.2 mg/dL 0.3 0.8 0.5  Bilirubin, Direct 0.1 - 0.5 mg/dL - - -    CBC Latest Ref Rng & Units 12/31/2015 12/30/2015 12/29/2015  WBC 4.0 - 10.5 K/uL 7.9 8.0 8.7  Hemoglobin 13.0 - 17.0 g/dL 13.3 13.7 13.7  Hematocrit 39.0 - 52.0 % 38.6(L) 38.3(L) 39.0  Platelets 150 - 400 K/uL 269 254 301   Lab Results  Component Value Date   MCV 83.4 12/31/2015   MCV 85.1 12/30/2015   MCV 83.0 12/29/2015    Lab Results  Component Value Date   TSH 2.27 05/22/2007    BNP    Component Value Date/Time   BNP 42.5 12/30/2015 0453    ProBNP No results found for: PROBNP   Lipid Panel     Component Value Date/Time   CHOL 113 (L) 03/08/2016 1116   TRIG 232 (H) 03/08/2016 1116   HDL 23 (L) 03/08/2016 1116   CHOLHDL 4.9 03/08/2016 1116   VLDL 46 (H) 03/08/2016 1116   LDLCALC 44 03/08/2016 1116   LDLDIRECT 103.1 05/22/2007 0922     RADIOLOGY: No results found.    ASSESSMENT AND PLAN: Mr Oyama  Is a 55 year old male who suffered and anterior ST segment elevation myocardial infarction on 07/09/2015 at which time he underwent successful acute intervention to totally occluded LAD and high-grade diagonal vessel as noted above.  He subsequently underwent staged intervention 2 days later to his RCA.  Clinically, he has felt well without recurrent anginal symptomatology.  His INR today was supratherapeutic at 3.9, and he was advised to hold today's Coumadin dose.  His blood pressure remains stable and on repeat by me was 110/70 on his current dose of lisinopril 2.5 mg, metoprolol 25 mg twice a day.  He is on atorvastatin 80 mg for hyperlipidemia with target LDL less  than 70.  He is now on low-dose warfarin plus Plavix.  Although it was ordered for him to get a P2Y12  test to assess Plavix responsiveness this apparently was never done.  He has gained 7 pounds since his last evaluation.  BMI is 35.5.  Weight loss was recommended.  In November, I am scheduling him for a one-year follow-up [place] study following his acute coronary syndrome.  He will also undergo an echo Doppler study.  I will see him back in the office in December for follow-up evaluation and further recommendations will be made at that time.  Time spent: 25 minutes  Troy Sine, MD, [facility]  05/10/2016 6:30 PM

## 2016-05-23 ENCOUNTER — Ambulatory Visit (INDEPENDENT_AMBULATORY_CARE_PROVIDER_SITE_OTHER)
Payer: No Typology Code available for payment source | Admitting: Pharmacist Clinician (PhC)/ Clinical Pharmacy Specialist

## 2016-05-23 DIAGNOSIS — Z7901 Long term (current) use of anticoagulants: Secondary | ICD-10-CM

## 2016-05-23 DIAGNOSIS — I513 Intracardiac thrombosis, not elsewhere classified: Secondary | ICD-10-CM

## 2016-05-23 LAB — POCT INR: INR: 3

## 2016-05-28 ENCOUNTER — Other Ambulatory Visit: Payer: Self-pay | Admitting: Cardiovascular Disease

## 2016-05-28 ENCOUNTER — Other Ambulatory Visit: Payer: Self-pay | Admitting: Family Medicine

## 2016-05-28 DIAGNOSIS — K219 Gastro-esophageal reflux disease without esophagitis: Secondary | ICD-10-CM

## 2016-05-28 MED FILL — LISINOPRIL 2.5 MG TABLET: 2.5 | 30 days supply | Qty: 30 | Fill #2

## 2016-05-28 MED FILL — FAMOTIDINE 20 MG TABLET: 20 | 30 days supply | Qty: 60 | Fill #0

## 2016-05-28 MED FILL — ?CYCLOBENZAPRINE 10 MG TABL: 10 | 10 days supply | Qty: 30 | Fill #0

## 2016-05-28 MED FILL — FLUTICASONE PROP 50 MCG SPR: 50 | 30 days supply | Qty: 16 | Fill #3

## 2016-05-28 MED FILL — OLOPATADINE HCL 0.1% EYE DR: 0.1 | 5 days supply | Qty: 5 | Fill #2

## 2016-05-28 MED FILL — ?SERTRALINE HCL 50 MG TABLE: 50 | 30 days supply | Qty: 30 | Fill #0

## 2016-05-28 MED FILL — CLOPIDOGREL 75 MG TABLET: 75 | 30 days supply | Qty: 30 | Fill #2

## 2016-05-28 MED FILL — ?TAMSULOSIN HCL 0.4 MG CAP: 0.4 | 30 days supply | Qty: 30 | Fill #1

## 2016-05-28 MED FILL — ?METOPROLOL 25 MG TABLET: 25 | 30 days supply | Qty: 60 | Fill #0

## 2016-05-28 MED FILL — ?HYDROXYZINE HCL 25 MG TAB: 25 | 20 days supply | Qty: 60 | Fill #2

## 2016-05-29 MED FILL — WARFARIN SODIUM 5 MG TABLET: 5 | 35 days supply | Qty: 35 | Fill #0

## 2016-05-29 MED FILL — NITROSTAT 0.4 MG TABLET SL: 0.4 | 25 days supply | Qty: 25 | Fill #0

## 2016-06-12 ENCOUNTER — Telehealth: Payer: Self-pay | Admitting: Cardiovascular Disease

## 2016-06-12 ENCOUNTER — Ambulatory Visit (INDEPENDENT_AMBULATORY_CARE_PROVIDER_SITE_OTHER)
Payer: No Typology Code available for payment source | Admitting: Pharmacist Clinician (PhC)/ Clinical Pharmacy Specialist

## 2016-06-12 ENCOUNTER — Ambulatory Visit (INDEPENDENT_AMBULATORY_CARE_PROVIDER_SITE_OTHER): Payer: No Typology Code available for payment source | Admitting: *Deleted

## 2016-06-12 DIAGNOSIS — Z23 Encounter for immunization: Secondary | ICD-10-CM

## 2016-06-12 DIAGNOSIS — I513 Intracardiac thrombosis, not elsewhere classified: Secondary | ICD-10-CM

## 2016-06-12 DIAGNOSIS — Z7901 Long term (current) use of anticoagulants: Secondary | ICD-10-CM

## 2016-06-12 LAB — POCT INR: INR: 2

## 2016-06-12 NOTE — Progress Notes (Signed)
PATIENT RECEIVED FLU IMMUNIZATION

## 2016-06-12 NOTE — Telephone Encounter (Signed)
New Message  Pt call requesting to speak with Belenda Cruise in coumadin. Pt states he was to have a appt today at 3:00. im not showing appt being schedule for today. Please call back to discuss

## 2016-06-12 NOTE — Telephone Encounter (Signed)
Spoke with patient. He thought he had an appt today. Belenda Cruise said ok to add pt in at 1530 today. Patient verbalized understanding of appt.

## 2016-06-14 ENCOUNTER — Ambulatory Visit: Payer: No Typology Code available for payment source | Attending: Family Medicine | Admitting: Family Medicine

## 2016-06-14 ENCOUNTER — Encounter: Payer: Self-pay | Admitting: Family Medicine

## 2016-06-14 VITALS — BP 83/54 | HR 72 | Temp 97.6°F | Ht 70.0 in | Wt 238.0 lb

## 2016-06-14 DIAGNOSIS — M25549 Pain in joints of unspecified hand: Secondary | ICD-10-CM

## 2016-06-14 DIAGNOSIS — E7800 Pure hypercholesterolemia, unspecified: Secondary | ICD-10-CM | POA: Insufficient documentation

## 2016-06-14 DIAGNOSIS — Z91018 Allergy to other foods: Secondary | ICD-10-CM | POA: Insufficient documentation

## 2016-06-14 DIAGNOSIS — Z7982 Long term (current) use of aspirin: Secondary | ICD-10-CM | POA: Insufficient documentation

## 2016-06-14 DIAGNOSIS — I2510 Atherosclerotic heart disease of native coronary artery without angina pectoris: Secondary | ICD-10-CM | POA: Insufficient documentation

## 2016-06-14 DIAGNOSIS — Z7901 Long term (current) use of anticoagulants: Secondary | ICD-10-CM | POA: Insufficient documentation

## 2016-06-14 DIAGNOSIS — R0982 Postnasal drip: Secondary | ICD-10-CM | POA: Insufficient documentation

## 2016-06-14 DIAGNOSIS — Z9889 Other specified postprocedural states: Secondary | ICD-10-CM | POA: Insufficient documentation

## 2016-06-14 DIAGNOSIS — N400 Enlarged prostate without lower urinary tract symptoms: Secondary | ICD-10-CM | POA: Insufficient documentation

## 2016-06-14 DIAGNOSIS — M2550 Pain in unspecified joint: Secondary | ICD-10-CM | POA: Insufficient documentation

## 2016-06-14 DIAGNOSIS — I252 Old myocardial infarction: Secondary | ICD-10-CM | POA: Insufficient documentation

## 2016-06-14 DIAGNOSIS — F418 Other specified anxiety disorders: Secondary | ICD-10-CM | POA: Insufficient documentation

## 2016-06-14 DIAGNOSIS — K219 Gastro-esophageal reflux disease without esophagitis: Secondary | ICD-10-CM | POA: Insufficient documentation

## 2016-06-14 DIAGNOSIS — I10 Essential (primary) hypertension: Secondary | ICD-10-CM | POA: Insufficient documentation

## 2016-06-14 DIAGNOSIS — Z87891 Personal history of nicotine dependence: Secondary | ICD-10-CM | POA: Insufficient documentation

## 2016-06-14 DIAGNOSIS — R399 Unspecified symptoms and signs involving the genitourinary system: Secondary | ICD-10-CM

## 2016-06-14 DIAGNOSIS — R0781 Pleurodynia: Secondary | ICD-10-CM

## 2016-06-14 DIAGNOSIS — I513 Intracardiac thrombosis, not elsewhere classified: Secondary | ICD-10-CM

## 2016-06-14 DIAGNOSIS — Z9861 Coronary angioplasty status: Secondary | ICD-10-CM

## 2016-06-14 DIAGNOSIS — F4310 Post-traumatic stress disorder, unspecified: Secondary | ICD-10-CM | POA: Insufficient documentation

## 2016-06-14 DIAGNOSIS — Z955 Presence of coronary angioplasty implant and graft: Secondary | ICD-10-CM | POA: Insufficient documentation

## 2016-06-14 MED ORDER — HYDROXYZINE HCL 25 MG PO TABS: 25.0000 mg | ORAL_TABLET | Freq: Three times a day (TID) | ORAL | 3 refills | Status: DC | PRN

## 2016-06-14 MED ORDER — LISINOPRIL 2.5 MG PO TABS
2.5000 mg | ORAL_TABLET | Freq: Every day | ORAL | 3 refills | Status: DC
Start: 2016-06-14 — End: 2016-08-27

## 2016-06-14 MED ORDER — CYCLOBENZAPRINE HCL 10 MG PO TABS: ORAL_TABLET | ORAL | 1 refills | Status: DC

## 2016-06-14 MED ORDER — CETIRIZINE HCL 10 MG PO TABS: 10.0000 mg | ORAL_TABLET | Freq: Every day | ORAL | 3 refills | Status: DC

## 2016-06-14 MED ORDER — METOPROLOL TARTRATE 25 MG PO TABS
25.0000 mg | ORAL_TABLET | Freq: Two times a day (BID) | ORAL | 3 refills | Status: DC
Start: 2016-06-14 — End: 2016-08-27

## 2016-06-14 MED ORDER — BUPROPION HCL ER (SR) 150 MG PO TB12: 150.0000 mg | ORAL_TABLET | Freq: Two times a day (BID) | ORAL | 3 refills | Status: DC

## 2016-06-14 MED ORDER — TAMSULOSIN HCL 0.4 MG PO CAPS: 0.4000 mg | ORAL_CAPSULE | Freq: Every day | ORAL | 3 refills | Status: DC

## 2016-06-14 MED ORDER — SERTRALINE HCL 50 MG PO TABS: 50.0000 mg | ORAL_TABLET | Freq: Every day | ORAL | 3 refills | Status: DC

## 2016-06-14 MED FILL — BUPROPION SR 150 MG TABLET: 150 | 30 days supply | Qty: 60 | Fill #0

## 2016-06-14 MED FILL — ?CYCLOBENZAPRINE 10 MG TABL: 10 | 30 days supply | Qty: 60 | Fill #0

## 2016-06-14 MED FILL — ?CETIRIZINE HCL 10 MG TABLE: 10 | 30 days supply | Qty: 30 | Fill #0

## 2016-06-14 NOTE — Progress Notes (Signed)
Subjective:  Patient ID: Henry David, male    DOB: Feb 01, 1961  Age: 55 y.o. MRN: [id number]  CC: Anxiety; Depression; and Fatigue   HPI [name] presents for a follow-up of depression and anxiety. Medical history is significant for GERD, coronary artery disease status post PCI with DES to LAD and RCA in 06/2015 (currently on Plavix), depression and anxiety, left apical mural thrombus diagnosed in 12/2015 (currently on anticoagulation on Coumadin which is managed by the Coumadin clinic). Last visit to cardiology was 05/08/16   Regarding his depression he remains on Wellbutrin, hydroxyzine and Zoloft and had presented to [facility] for an initial assessment but never went back to see the psychiatrist. He is still depressed especially when he looks around and sees the belongings of his deceased parents in the house. Denies suicidal ideation or intent.  He complains of chest wall pain beneath his ribs ever since he took a fall 2 months ago and pain is worse with movement or coughing; he did not sustain any bruises.  He also has pain in the joints of his hands and his knees which are worse in the morning leading him to sit for a few minutes after waking up prior to getting up. He has been taking Tylenol for the pain  His exercise tolerance is affected by his mood as he finds himself sleeping for the most part of the day. Denies pedal edema or orthopnea. He has gained 28 lbs ever since he quit smoking one year ago.  Past Medical History:  Diagnosis Date  . Anxiety   . Apical mural thrombus   . BPH (benign prostatic hyperplasia)   . Depression   . Diverticulitis   . Elevated cholesterol   . Essential hypertension   . Post traumatic stress disorder   . STEMI (ST elevation myocardial infarction) [facility])     Past Surgical History:  Procedure Laterality Date  . CARDIAC CATHETERIZATION N/A 07/09/2015   Procedure: Left Heart Cath and Coronary Angiography;  Surgeon: Lennette Bihari, MD;   Location: [facility] INVASIVE CV LAB;  Service: Cardiovascular;  Laterality: N/A;  . CARDIAC CATHETERIZATION N/A 07/09/2015   Procedure: Coronary Stent Intervention;  Surgeon: Lennette Bihari, MD;  Location: [facility] INVASIVE CV LAB;  Service: Cardiovascular;  Laterality: N/A;  . CARDIAC CATHETERIZATION N/A 07/11/2015   Procedure: Coronary Stent Intervention;  Surgeon: Lennette Bihari, MD;  Location: [facility] INVASIVE CV LAB;  Service: Cardiovascular;  Laterality: N/A;  . KNEE SURGERY      Allergies  Allergen Reactions  . Coconut Fatty Acids     unknown     Outpatient Medications Prior to Visit  Medication Sig Dispense Refill  . acetaminophen (TYLENOL) 500 MG tablet Take 500 mg by mouth every 6 (six) hours as needed.    Marland Kitchen aspirin 81 MG EC tablet Take 1 tablet (81 mg total) by mouth daily. 30 tablet 0  . atorvastatin (LIPITOR) 80 MG tablet Take 1 tablet (80 mg total) by mouth daily at 6 PM. Must have office visit 30 tablet 0  . clopidogrel (PLAVIX) 75 MG tablet Take 1 tablet (75 mg total) by mouth daily. 90 tablet 3  . famotidine (PEPCID) 20 MG tablet Take 1 tablet (20 mg total) by mouth 2 (two) times daily. 60 tablet 0  . fluticasone (FLONASE) 50 MCG/ACT nasal spray PLACE 2 SPRAYS INTO BOTH NOSTRILS DAILY. 16 g 3  . NITROSTAT 0.4 MG SL tablet PLACE 1 TABLET UNDER THE TONGUE EVERY 5 MINUTES AS  NEEDED FOR CHEST PAIN. MAX-3 DOSES WITHIN A 15 MINUTE INTERVAL 25 tablet 0  . warfarin (COUMADIN) 5 MG tablet TAKE 1/2 TO 1 TABLET DAILY OR AS DIRECTED BY COUMADIN CLINIC 35 tablet 0  . buPROPion (WELLBUTRIN SR) 150 MG 12 hr tablet TAKE ONE TABLET BY MOUTH TWICE DAILY 60 tablet 0  . cyclobenzaprine (FLEXERIL) 10 MG tablet TAKE 1 TABLET BY MOUTH 3 TIMES DAILY AS NEEDED FOR MUSCLE SPASMS. 30 tablet 0  . hydrOXYzine (ATARAX/VISTARIL) 25 MG tablet Take 1 tablet (25 mg total) by mouth 3 (three) times daily as needed. 60 tablet 3  . lisinopril (PRINIVIL,ZESTRIL) 2.5 MG tablet Take 1 tablet (2.5 mg total) by mouth at bedtime.  30 tablet 3  . metoprolol tartrate (LOPRESSOR) 25 MG tablet Take 1 tablet (25 mg total) by mouth 2 (two) times daily. 60 tablet 3  . sertraline (ZOLOFT) 50 MG tablet Take 1 tablet (50 mg total) by mouth daily. 30 tablet 3  . tamsulosin (FLOMAX) 0.4 MG CAPS capsule Take 1 capsule (0.4 mg total) by mouth daily. 30 capsule 3   No facility-administered medications prior to visit.     ROS Review of Systems Constitutional: Negative for activity change and appetite change.  HENT: Negative for sinus pressure and sore throat.   Eyes: Negative for visual disturbance.  positive for itchy eyes Respiratory: Negative for cough, chest tightness and shortness of breath.   Cardiovascular: Negative for chest pain and leg swelling.  Gastrointestinal: Negative for abdominal pain, diarrhea, constipation and abdominal distention.  Endocrine: Negative.   Genitourinary: Negative for dysuria.  Musculoskeletal: see hpi  Skin: Negative for rash.  Allergic/Immunologic: Negative.   Neurological: Negative for weakness, light-headedness and numbness.  Psychiatric/Behavioral: Positive for dysphoric mood. Negative for suicidal ideas.   Objective:  BP (!) 83/54 (BP Location: Right Arm, Patient Position: Sitting, Cuff Size: Large)   Pulse 72   Temp 97.6 F (36.4 C) (Oral)   Ht 5\' 10"  (1.778 m)   Wt 238 lb (108 kg)   SpO2 95%   BMI 34.15 kg/m   BP/Weight 06/14/2016 05/08/2016 03/08/2016  Systolic BP 83 97 107  Diastolic BP 54 71 70  Wt. (Lbs) 238 233.4 226.2  BMI 34.15 35.49 33.79      Physical Exam Constitutional: He is oriented to person, place, and time. He appears well-developed and well-nourished.  Cardiovascular: Normal rate, normal heart sounds and intact distal pulses.   No murmur heard. Pulmonary/Chest: Effort normal and breath sounds normal. He has no wheezes. He has no rales. He exhibits no tenderness.  Abdominal: Soft. Bowel sounds are normal. He exhibits no distension and no mass. There is no  tenderness.  Musculoskeletal: Normal range of motion.  Neurological: He is alert and oriented to person, place, and time.  Skin: Skin is warm and dry.  Psychiatric: He has a depressed mood   CMP Latest Ref Rng & Units 03/08/2016 12/30/2015 12/29/2015  Glucose 65 - 99 mg/dL 528(U) 132(G) 401(U)  BUN 7 - 25 mg/dL 12 19 27(O)  Creatinine 0.70 - 1.33 mg/dL 5.36 6.44 0.34  Sodium 135 - 146 mmol/L 138 137 137  Potassium 3.5 - 5.3 mmol/L 4.6 3.9 4.0  Chloride 98 - 110 mmol/L 105 105 106  CO2 20 - 31 mmol/L 24 24 24   Calcium 8.6 - 10.3 mg/dL 8.9 9.0 9.1  Total Protein 6.1 - 8.1 g/dL 7.1 - 7.8  Total Bilirubin 0.2 - 1.2 mg/dL 0.3 - 0.8  Alkaline Phos 40 - 115 U/L  95 - 87  AST 10 - 35 U/L 17 - 25  ALT 9 - 46 U/L 23 - 33    Lipid Panel     Component Value Date/Time   CHOL 113 (L) 03/08/2016 1116   TRIG 232 (H) 03/08/2016 1116   HDL 23 (L) 03/08/2016 1116   CHOLHDL 4.9 03/08/2016 1116   VLDL 46 (H) 03/08/2016 1116   LDLCALC 44 03/08/2016 1116   LDLDIRECT 103.1 05/22/2007 0922    Assessment & Plan:   1. Depression with anxiety Uncontrolled - PHQ9=24, GAD7 = 17 Has had psychotherapy sessions here in the clinic in the recent past [facility] - buPROPion (WELLBUTRIN SR) 150 MG 12 hr tablet; Take 1 tablet (150 mg total) by mouth 2 (two) times daily.  Dispense: 60 tablet; Refill: 3 - hydrOXYzine (ATARAX/VISTARIL) 25 MG tablet; Take 1 tablet (25 mg total) by mouth 3 (three) times daily as needed.  Dispense: 60 tablet; Refill: 3 - sertraline (ZOLOFT) 50 MG tablet; Take 1 tablet (50 mg total) by mouth daily.  Dispense: 30 tablet; Refill: 3  2. CAD S/P LAD and RCA DES No angina at this time  BP is on the low side but he is asymptomatic - will monitor Risk factor modification-he is working on weight loss. Continue statin, Plavix Schedule for 2-D echo and stress test later this month - lisinopril (PRINIVIL,ZESTRIL) 2.5 MG tablet; Take 1 tablet (2.5 mg total) by  mouth at bedtime.  Dispense: 30 tablet; Refill: 3 - metoprolol tartrate (LOPRESSOR) 25 MG tablet; Take 1 tablet (25 mg total) by mouth 2 (two) times daily.  Dispense: 60 tablet; Refill: 3  3. Lower urinary tract symptoms Controlled - tamsulosin (FLOMAX) 0.4 MG CAPS capsule; Take 1 capsule (0.4 mg total) by mouth daily.  Dispense: 30 capsule; Refill: 3  4. Post-nasal drip - cetirizine (ZYRTEC) 10 MG tablet; Take 1 tablet (10 mg total) by mouth daily.  Dispense: 30 tablet; Refill: 3  5. Rib pain on right side Intermittent-Secondary to a fall 2 months ago - cyclobenzaprine (FLEXERIL) 10 MG tablet; TAKE 1 TABLET BY MOUTH 2 TIMES DAILY AS NEEDED FOR MUSCLE SPASMS.  Dispense: 60 tablet; Refill: 1  6. Mural thrombus of cardiac apex Currently on anticoagulation with Coumadin Last INR was 2.0 Continue follow-up with the Coumadin clinic  7. Arthralgias Likely underlying osteoarthritis Unable to take NSAIDs due to interaction with Coumadin Unable to place on tramadol due to increased risk of serotonin syndrome with SSRI Patient to take Tylenol Meds ordered this encounter  Medications  . buPROPion (WELLBUTRIN SR) 150 MG 12 hr tablet    Sig: Take 1 tablet (150 mg total) by mouth 2 (two) times daily.    Dispense:  60 tablet    Refill:  3  . cyclobenzaprine (FLEXERIL) 10 MG tablet    Sig: TAKE 1 TABLET BY MOUTH 2 TIMES DAILY AS NEEDED FOR MUSCLE SPASMS.    Dispense:  60 tablet    Refill:  1  . hydrOXYzine (ATARAX/VISTARIL) 25 MG tablet    Sig: Take 1 tablet (25 mg total) by mouth 3 (three) times daily as needed.    Dispense:  60 tablet    Refill:  3  . lisinopril (PRINIVIL,ZESTRIL) 2.5 MG tablet    Sig: Take 1 tablet (2.5 mg total) by mouth at bedtime.    Dispense:  30 tablet    Refill:  3  . metoprolol tartrate (LOPRESSOR) 25 MG tablet    Sig: Take  1 tablet (25 mg total) by mouth 2 (two) times daily.    Dispense:  60 tablet    Refill:  3  . sertraline (ZOLOFT) 50 MG tablet    Sig:  Take 1 tablet (50 mg total) by mouth daily.    Dispense:  30 tablet    Refill:  3  . tamsulosin (FLOMAX) 0.4 MG CAPS capsule    Sig: Take 1 capsule (0.4 mg total) by mouth daily.    Dispense:  30 capsule    Refill:  3  . cetirizine (ZYRTEC) 10 MG tablet    Sig: Take 1 tablet (10 mg total) by mouth daily.    Dispense:  30 tablet    Refill:  3    Follow-up: Return in about 3 months (around 09/14/2016) for Follow-up on depression and anxiety.   Jaclyn Shaggy MD

## 2016-06-20 ENCOUNTER — Telehealth (HOSPITAL_COMMUNITY): Payer: Self-pay

## 2016-06-20 NOTE — Telephone Encounter (Signed)
Encounter complete. 

## 2016-06-24 ENCOUNTER — Other Ambulatory Visit: Payer: Self-pay

## 2016-06-24 ENCOUNTER — Encounter (INDEPENDENT_AMBULATORY_CARE_PROVIDER_SITE_OTHER): Payer: Self-pay

## 2016-06-24 ENCOUNTER — Ambulatory Visit (HOSPITAL_COMMUNITY): Payer: No Typology Code available for payment source | Attending: Cardiology

## 2016-06-24 DIAGNOSIS — I2510 Atherosclerotic heart disease of native coronary artery without angina pectoris: Secondary | ICD-10-CM

## 2016-06-24 DIAGNOSIS — I071 Rheumatic tricuspid insufficiency: Secondary | ICD-10-CM | POA: Insufficient documentation

## 2016-06-24 DIAGNOSIS — R29898 Other symptoms and signs involving the musculoskeletal system: Secondary | ICD-10-CM | POA: Insufficient documentation

## 2016-06-24 DIAGNOSIS — Z9861 Coronary angioplasty status: Secondary | ICD-10-CM | POA: Insufficient documentation

## 2016-06-24 DIAGNOSIS — I10 Essential (primary) hypertension: Secondary | ICD-10-CM

## 2016-06-24 MED ORDER — PERFLUTREN LIPID MICROSPHERE
1.0000 mL | INTRAVENOUS | Status: AC | PRN
  Administered 2016-06-24: 2 mL via INTRAVENOUS

## 2016-06-25 ENCOUNTER — Ambulatory Visit (HOSPITAL_COMMUNITY)
Admission: RE | Admit: 2016-06-25 | Discharge: 2016-06-25 | Disposition: A | Discharge status: Home / Self Care | Payer: No Typology Code available for payment source | Source: Ambulatory Visit | Attending: Cardiovascular Disease | Admitting: Cardiovascular Disease

## 2016-06-25 DIAGNOSIS — I10 Essential (primary) hypertension: Secondary | ICD-10-CM

## 2016-06-25 DIAGNOSIS — I259 Chronic ischemic heart disease, unspecified: Secondary | ICD-10-CM | POA: Insufficient documentation

## 2016-06-25 DIAGNOSIS — I501 Left ventricular failure: Secondary | ICD-10-CM | POA: Insufficient documentation

## 2016-06-25 DIAGNOSIS — I517 Cardiomegaly: Secondary | ICD-10-CM | POA: Insufficient documentation

## 2016-06-25 DIAGNOSIS — I2510 Atherosclerotic heart disease of native coronary artery without angina pectoris: Secondary | ICD-10-CM | POA: Insufficient documentation

## 2016-06-25 DIAGNOSIS — Z9861 Coronary angioplasty status: Secondary | ICD-10-CM | POA: Insufficient documentation

## 2016-06-25 DIAGNOSIS — I252 Old myocardial infarction: Secondary | ICD-10-CM | POA: Insufficient documentation

## 2016-06-25 LAB — MYOCARDIAL PERFUSION IMAGING
CHL CUP NUCLEAR SDS: 4
CHL CUP NUCLEAR SRS: 18
CHL CUP NUCLEAR SSS: 22
LV dias vol: 146 mL (ref 62–150)
LV sys vol: 86 mL
Peak HR: 76 {beats}/min
Rest HR: 56 {beats}/min
TID: 1.28

## 2016-06-25 MED ORDER — REGADENOSON 0.4 MG/5ML IV SOLN
0.4000 mg | Freq: Once | INTRAVENOUS | Status: AC
Start: 2016-06-25 — End: 2016-06-25
  Administered 2016-06-25: 0.4 mg via INTRAVENOUS

## 2016-06-25 MED ORDER — TECHNETIUM TC 99M TETROFOSMIN IV KIT
9.9000 | PACK | Freq: Once | INTRAVENOUS | Status: AC | PRN
  Administered 2016-06-25: 9.9 via INTRAVENOUS
  Filled 2016-06-25: qty 10

## 2016-06-25 MED ORDER — AMINOPHYLLINE 25 MG/ML IV SOLN
75.0000 mg | Freq: Once | INTRAVENOUS | Status: AC
  Administered 2016-06-25: 75 mg via INTRAVENOUS

## 2016-06-25 MED ORDER — TECHNETIUM TC 99M TETROFOSMIN IV KIT
30.3000 | PACK | Freq: Once | INTRAVENOUS | Status: AC | PRN
Start: 2016-06-25 — End: 2016-06-25
  Administered 2016-06-25: 30.3 via INTRAVENOUS
  Filled 2016-06-25: qty 31

## 2016-06-28 ENCOUNTER — Other Ambulatory Visit: Payer: Self-pay | Admitting: Cardiovascular Disease

## 2016-06-28 ENCOUNTER — Other Ambulatory Visit: Payer: Self-pay | Admitting: Family Medicine

## 2016-06-28 DIAGNOSIS — H1013 Acute atopic conjunctivitis, bilateral: Secondary | ICD-10-CM

## 2016-06-28 DIAGNOSIS — K219 Gastro-esophageal reflux disease without esophagitis: Secondary | ICD-10-CM

## 2016-06-28 MED FILL — NITROSTAT 0.4 MG TABLET SL: 0.4 | 25 days supply | Qty: 25 | Fill #0

## 2016-06-28 MED FILL — CLOPIDOGREL 75 MG TABLET: 75 | 30 days supply | Qty: 30 | Fill #3

## 2016-06-28 MED FILL — hydrOXYzine HCL 25 MG TABS: 25 | 20 days supply | Qty: 60 | Fill #3

## 2016-06-28 MED FILL — ?SERTRALINE HCL 50 MG TABLE: 50 | 30 days supply | Qty: 30 | Fill #1

## 2016-06-28 MED FILL — ?WARFARIN SODIUM 5 MG TABLE: 5 | 35 days supply | Qty: 35 | Fill #0

## 2016-06-28 MED FILL — ?TAMSULOSIN HCL 0.4 MG CAP: 0.4 | 30 days supply | Qty: 30 | Fill #2

## 2016-06-28 MED FILL — FLUTICASONE PROP 50 MCG SPR: 50 | 30 days supply | Qty: 16 | Fill #0

## 2016-06-28 MED FILL — METOPROLOL TARTRATE 25 MG T: 25 | 30 days supply | Qty: 60 | Fill #1

## 2016-06-28 MED FILL — FAMOTIDINE 20 MG TABLET: 20 | 30 days supply | Qty: 60 | Fill #0

## 2016-06-28 MED FILL — OLOPATADINE HCL 0.1% EYE DR: 0.1 | 10 days supply | Qty: 10 | Fill #0

## 2016-06-28 MED FILL — LISINOPRIL 2.5 MG TABLET: 2.5 | 30 days supply | Qty: 30 | Fill #3

## 2016-07-03 ENCOUNTER — Other Ambulatory Visit: Payer: Self-pay | Admitting: Family Medicine

## 2016-07-03 ENCOUNTER — Other Ambulatory Visit: Payer: Self-pay | Admitting: Cardiovascular Disease

## 2016-07-03 DIAGNOSIS — Z9861 Coronary angioplasty status: Principal | ICD-10-CM

## 2016-07-03 DIAGNOSIS — I2510 Atherosclerotic heart disease of native coronary artery without angina pectoris: Secondary | ICD-10-CM

## 2016-07-03 MED ORDER — WARFARIN SODIUM 5 MG PO TABS: ORAL_TABLET | ORAL | 1 refills | Status: DC

## 2016-07-03 MED ORDER — ATORVASTATIN CALCIUM 80 MG PO TABS: 80.0000 mg | ORAL_TABLET | Freq: Every day | ORAL | 3 refills | Status: DC

## 2016-07-03 MED FILL — ATORVASTATIN 80 MG TABLET: 80 | 30 days supply | Qty: 30 | Fill #0 | Status: TO

## 2016-07-03 NOTE — Telephone Encounter (Signed)
RX sent to [facility] per request.

## 2016-07-03 NOTE — Telephone Encounter (Signed)
Pt calling requesting a refill on Atorvastatin and warfarin. Please advise

## 2016-07-10 ENCOUNTER — Ambulatory Visit (INDEPENDENT_AMBULATORY_CARE_PROVIDER_SITE_OTHER): Payer: No Typology Code available for payment source | Admitting: Pharmacist

## 2016-07-10 DIAGNOSIS — I513 Intracardiac thrombosis, not elsewhere classified: Secondary | ICD-10-CM

## 2016-07-10 DIAGNOSIS — I2510 Atherosclerotic heart disease of native coronary artery without angina pectoris: Secondary | ICD-10-CM

## 2016-07-10 DIAGNOSIS — Z9861 Coronary angioplasty status: Secondary | ICD-10-CM

## 2016-07-10 DIAGNOSIS — Z7901 Long term (current) use of anticoagulants: Secondary | ICD-10-CM

## 2016-07-10 LAB — POCT INR: INR: 3

## 2016-07-10 MED ORDER — CLOPIDOGREL BISULFATE 75 MG PO TABS: 75.0000 mg | ORAL_TABLET | Freq: Every day | ORAL | 0 refills | Status: DC

## 2016-07-16 MED FILL — BUPROPION SR 150 MG TABLET: 150 | 30 days supply | Qty: 60 | Fill #0

## 2016-07-16 MED FILL — CYCLOBENZAPRINE 10 MG TAB: 10 | 30 days supply | Qty: 60 | Fill #1

## 2016-07-16 MED FILL — ?CETIRIZINE HCL 10 MG TABLE: 10 | 30 days supply | Qty: 30 | Fill #1

## 2016-07-19 ENCOUNTER — Other Ambulatory Visit: Payer: Self-pay | Admitting: Family Medicine

## 2016-07-19 DIAGNOSIS — F418 Other specified anxiety disorders: Secondary | ICD-10-CM

## 2016-07-19 DIAGNOSIS — K219 Gastro-esophageal reflux disease without esophagitis: Secondary | ICD-10-CM

## 2016-07-24 ENCOUNTER — Ambulatory Visit (INDEPENDENT_AMBULATORY_CARE_PROVIDER_SITE_OTHER): Payer: No Typology Code available for payment source | Admitting: Pharmacist

## 2016-07-24 ENCOUNTER — Encounter: Payer: Self-pay | Admitting: Cardiovascular Disease

## 2016-07-24 ENCOUNTER — Ambulatory Visit (INDEPENDENT_AMBULATORY_CARE_PROVIDER_SITE_OTHER): Payer: No Typology Code available for payment source | Admitting: Cardiovascular Disease

## 2016-07-24 VITALS — BP 144/72 | HR 64 | Ht 70.0 in | Wt 238.4 lb

## 2016-07-24 DIAGNOSIS — I2102 ST elevation (STEMI) myocardial infarction involving left anterior descending coronary artery: Secondary | ICD-10-CM

## 2016-07-24 DIAGNOSIS — I513 Intracardiac thrombosis, not elsewhere classified: Secondary | ICD-10-CM

## 2016-07-24 DIAGNOSIS — Z5181 Encounter for therapeutic drug level monitoring: Secondary | ICD-10-CM

## 2016-07-24 DIAGNOSIS — I10 Essential (primary) hypertension: Secondary | ICD-10-CM

## 2016-07-24 DIAGNOSIS — E785 Hyperlipidemia, unspecified: Secondary | ICD-10-CM

## 2016-07-24 DIAGNOSIS — Z7901 Long term (current) use of anticoagulants: Secondary | ICD-10-CM

## 2016-07-24 DIAGNOSIS — I255 Ischemic cardiomyopathy: Secondary | ICD-10-CM

## 2016-07-24 DIAGNOSIS — I2510 Atherosclerotic heart disease of native coronary artery without angina pectoris: Secondary | ICD-10-CM

## 2016-07-24 DIAGNOSIS — Z79899 Other long term (current) drug therapy: Secondary | ICD-10-CM

## 2016-07-24 LAB — POCT INR: INR: 2.2

## 2016-07-24 NOTE — Patient Instructions (Addendum)
Your physician recommends that you return for lab work in: FASTING.  Your physician has requested that you have an echocardiogram In MAY 2018. Echocardiography is a painless test that uses sound waves to create images of your heart. It provides your doctor with information about the size and shape of your heart and how well your heart's chambers and valves are working. This procedure takes approximately one hour. There are no restrictions for this procedure.  Your physician has recommended you make the following change in your medication: stop  [facility]   Your physician recommends that you schedule a follow-up appointment in MAY 2018.

## 2016-07-26 NOTE — Progress Notes (Signed)
Patient ID: Henry David, male   DOB: 1960/10/28, 55 y.o.   MRN: [id number]      PCP: Dr. Arnoldo Morale  HPI: Henry David is a 55 y.o. male who presents to the office today for a 3 month  follow up cardiology evaluation.  Mr Henry David has a history of anxiety , depression and stress disorder.  He had not seen a physician in years in the past was told of having high cholesterol but never received treatment. On July 09, 2015 he developed new onset chest pain leading to a [facility] emergency room presentation.  His ECG several hours after presentation revealed anterolateral ST elevation and he was transported to [facility] for emergent catheterization and PCI. Catheterization was performed by me, which revealed mild acute LV dysfunction with mid anterolateral hypocontractility and an EF of 45-50%.  There was significant multivessel CAD with the LAD giving rise to a bifurcating diagonal vessel with tubular narrowing of 75-80% and after the takeoff of the proximal ulcerative perforating artery the LAD had a 95% napkin ring stenosis followed by 60% stenosis in its midsegment. The left circumflex coronary artery was felt to have an 80% eccentric narrowing in the first marginal branch. The RCA was a large dominant vessel with diffuse 80% proximal stenosis.  He underwent successful intervention with PTCA stenting of the mid LAD with insertion of a 2.7518 mm Xience Alpine stent postdilated to 2.97 mm in the 100% occlusion reduced to 0%, and PTCA of his diagonal stenosis being reduced to approximate 5%. 2 days later he underwent repeat catheterization which showed patent intervention sites.  His circumflex marginal stenosis had improved and now only appeared 30%. His RCA stenosis appeared 90% and he underwent successful stenting of the RCA  with insertion of a 4.018 mm Xience Alpine DES stent postdilated to 4.51 mm with the stenoses being reduced to 0% in the staged procedure.   When I saw him in  February 2017 he remained stable without recurrent chest pain development.  He was on  aspirin and Brilinta 90 mg twice a day for dual antiplatelet therapy.  He has been on metoprolol, tartrate 25 mg twice a day and low-dose lisinopril 2.5 mg in the morning for CAD.  He has had issues with low blood pressure and had been off lisinopril.  I suggested since his blood pressure was stable of rechallenge of low-dose therapy..  For his anxiety. He was on Wellbutrin 150 mg twice a day.  He was tolerating atorvastatin 80 mg for his hyperlipidemia.  He denied myalgias.    He underwent an echo study on 12/26/2015 which showed an EF of 40-45%.  There was akinesis of the anteroseptal and apical myocardium and he had grade 1 diastolic dysfunction.  Aortic root was mildly dilated.  There appeared to be an apical thrombus on definity evaluation.  He was hospitalized on 12/29/2015 with palpitations and increased anxiety.  During that evaluation, he was started on Coumadin in light of his apical thrombus. When I saw him in follow-up, since he was started on Coumadin, I discontinued Brilinta and started Plavix.    Since I last saw him, he has continued to do well.  Specifically, he denies chest pain, PND, orthopnea, or palpitations.  He underwent a follow-up echo Doppler study on 06/24/2016 with Definity contrast.  EF was 40-45%.  There was akinesis of the mid apical, anteroseptal and anterior wall.  He had grade 1 diastolic dysfunction.  He was no evidence for  any thrombus on contrast evaluation.  There was trivial TR.  He also underwent a one-year follow-up nuclear perfusion study which showed an EF of 41%.  There was evidence for his prior scar in the mid anterior, anteroseptal and apical segment.  There was no associated ischemia.  Review of his medications reveals that he has been taking triple drug therapy with aspirin, Plavix and warfarin.  He presents for follow-up evaluation  Past Medical History:  Diagnosis Date    . Anxiety   . Apical mural thrombus   . BPH (benign prostatic hyperplasia)   . Depression   . Diverticulitis   . Elevated cholesterol   . Essential hypertension   . Post traumatic stress disorder   . STEMI (ST elevation myocardial infarction) [facility])     Past Surgical History:  Procedure Laterality Date  . CARDIAC CATHETERIZATION N/A 07/09/2015   Procedure: Left Heart Cath and Coronary Angiography;  Surgeon: Troy Sine, MD;  Location: [place] [facility];  Service: Cardiovascular;  Laterality: N/A;  . CARDIAC CATHETERIZATION N/A 07/09/2015   Procedure: Coronary Stent Intervention;  Surgeon: Troy Sine, MD;  Location: [place] [facility];  Service: Cardiovascular;  Laterality: N/A;  . CARDIAC CATHETERIZATION N/A 07/11/2015   Procedure: Coronary Stent Intervention;  Surgeon: Troy Sine, MD;  Location: [place] [facility];  Service: Cardiovascular;  Laterality: N/A;  . KNEE SURGERY      Allergies  Allergen Reactions  . Coconut Fatty Acids     unknown    Current Outpatient Prescriptions  Medication Sig Dispense Refill  . acetaminophen (TYLENOL) 500 MG tablet Take 500 mg by mouth every 6 (six) hours as needed.    Marland Kitchen aspirin 81 MG EC tablet Take 1 tablet (81 mg total) by mouth daily. 30 tablet 0  . atorvastatin (LIPITOR) 80 MG tablet Take 1 tablet (80 mg total) by mouth daily at 6 PM. Must have office visit 30 tablet 3  . buPROPion (WELLBUTRIN SR) 150 MG 12 hr tablet Take 1 tablet (150 mg total) by mouth 2 (two) times daily. 60 tablet 3  . cetirizine (ZYRTEC) 10 MG tablet Take 1 tablet (10 mg total) by mouth daily. 30 tablet 3  . clopidogrel (PLAVIX) 75 MG tablet Take 1 tablet (75 mg total) by mouth daily. 90 tablet 0  . cyclobenzaprine (FLEXERIL) 10 MG tablet TAKE 1 TABLET BY MOUTH 2 TIMES DAILY AS NEEDED FOR MUSCLE SPASMS. 60 tablet 1  . famotidine (PEPCID) 20 MG tablet TAKE 1 TABLET BY MOUTH TWICE DAILY. 60 tablet 2  . fluticasone (FLONASE) 50 MCG/ACT nasal spray PLACE 2  SPRAYS INTO BOTH NOSTRILS DAILY. 16 g 3  . hydrOXYzine (ATARAX/VISTARIL) 25 MG tablet Take 1 tablet (25 mg total) by mouth 3 (three) times daily as needed. 60 tablet 3  . hydrOXYzine (ATARAX/VISTARIL) 25 MG tablet TAKE 1 TABLET BY MOUTH 3 TIMES DAILY AS NEEDED. 60 tablet 3  . lisinopril (PRINIVIL,ZESTRIL) 2.5 MG tablet Take 1 tablet (2.5 mg total) by mouth at bedtime. 30 tablet 3  . metoprolol tartrate (LOPRESSOR) 25 MG tablet Take 1 tablet (25 mg total) by mouth 2 (two) times daily. 60 tablet 3  . NITROSTAT 0.4 MG SL tablet PLACE 1 TABLET UNDER THE TONGUE EVERY 5 MINUTES AS NEEDED FOR CHEST PAIN. MAX-3 DOSES WITHIN A 15 MINUTE INTERVAL 25 tablet 0  . olopatadine (PATANOL) 0.1 % ophthalmic solution PLACE 1 DROP INTO BOTH EYES 2 TIMES DAILY. 15 mL 0  . sertraline (ZOLOFT) 50  MG tablet Take 1 tablet (50 mg total) by mouth daily. 30 tablet 3  . tamsulosin (FLOMAX) 0.4 MG CAPS capsule Take 1 capsule (0.4 mg total) by mouth daily. 30 capsule 3   No current facility-administered medications for this visit.     Social History   Social History  . Marital status: Single    Spouse name: N/A  . Number of children: N/A  . Years of education: N/A   Occupational History  . Not on file.   Social History Main Topics  . Smoking status: Former Smoker    Packs/day: 0.00    Types: Cigarettes    Quit date: 07/04/2015  . Smokeless tobacco: Former Systems developer  . Alcohol use 2.4 oz/week    4 Cans of beer per week     Comment: occassional use  . Drug use: No  . Sexual activity: Not Currently   Other Topics Concern  . Not on file   Social History Narrative  . No narrative on file    Family History  Problem Relation Age of Onset  . Heart disease Mother   . Heart disease Father     ROS General: Negative; No fevers, chills, or night sweats HEENT: Negative; No changes in vision or hearing, sinus congestion, difficulty swallowing Pulmonary: Negative; No cough, wheezing, shortness of breath,  hemoptysis Cardiovascular: See HPI: No chest pain, presyncope, syncope, palpatations GI: Negative; No nausea, vomiting, diarrhea, or abdominal pain GU: Negative; No dysuria, hematuria, or difficulty voiding Musculoskeletal: Negative; no myalgias, joint pain, or weakness Hematologic: Negative; no easy bruising, bleeding Endocrine: Negative; no heat/cold intolerance; no diabetes, Neuro: Negative; no changes in balance, headaches Skin: Negative; No rashes or skin lesions Psychiatric: Negative; No behavioral problems, depression Sleep: Negative; No snoring,  daytime sleepiness, hypersomnolence, bruxism, restless legs, hypnogognic hallucinations. Other comprehensive 14 point system review is negative   Physical Exam BP (!) 144/72   Pulse 64   Ht '5\' 10"'$  (1.778 m)   Wt 238 lb 6.4 oz (108.1 kg)   BMI 34.21 kg/m    AP blood pressure by me was 104/60 supine and 94/60 standing.  Wt Readings from Last 3 Encounters:  07/24/16 238 lb 6.4 oz (108.1 kg)  06/25/16 233 lb (105.7 kg)  06/14/16 238 lb (108 kg)   General: Alert, oriented, no distress.  Skin: normal turgor, no rashes, warm and dry HEENT: Normocephalic, atraumatic. Pupils equal round and reactive to light; sclera anicteric; extraocular muscles intact, No lid lag; Nose without nasal septal hypertrophy; Mouth/Parynx benign; Mallinpatti scale 3 Neck: No JVD, no carotid bruits; normal carotid upstroke Lungs: clear to ausculatation and percussion bilaterally; no wheezing or rales, normal inspiratory and expiratory effort Chest wall: without tenderness to palpitation Heart: PMI not displaced, RRR, s1 s2 normal, 1/6 systolic murmur, No diastolic murmur, no rubs, gallops, thrills, or heaves Abdomen: soft, nontender; no hepatosplenomehaly, BS+; abdominal aorta nontender and not dilated by palpation. Back: no CVA tenderness Pulses: 2+  Musculoskeletal: full range of motion, normal strength, no joint deformities Extremities: Pulses 2+, no  clubbing cyanosis or edema, Homan's sign negative  Neurologic: grossly nonfocal; Cranial nerves grossly wnl Psychologic: Normal mood and affect  ECG (independently read by me): Normal sinus rhythm at 64 bpm.  PR interval 152 ms; QTc interval 418 ms.  QS V1 and V2.  September 2017 ECG (independently read by me): Normal sinus rhythm at 60 bpm.  Low voltage.  No ST segment changes.  Normal intervals.  February 2017 ECG (independently read by  me):  Normal sinus rhythm at 70 bpm. Nonspecific T abnormality in lead 3. Poor progression V1 through V3.Marland Kitchen  Normal intervals.  LABS:  BMP Latest Ref Rng & Units 03/08/2016 12/30/2015 12/29/2015  Glucose 65 - 99 mg/dL 110(H) 110(H) 105(H)  BUN 7 - 25 mg/dL 12 19 21(H)  Creatinine 0.70 - 1.33 mg/dL 1.05 0.97 1.08  Sodium 135 - 146 mmol/L 138 137 137  Potassium 3.5 - 5.3 mmol/L 4.6 3.9 4.0  Chloride 98 - 110 mmol/L 105 105 106  CO2 20 - 31 mmol/L '24 24 24  '$ Calcium 8.6 - 10.3 mg/dL 8.9 9.0 9.1     Hepatic Function Latest Ref Rng & Units 03/08/2016 12/29/2015 09/27/2015  Total Protein 6.1 - 8.1 g/dL 7.1 7.8 7.3  Albumin 3.6 - 5.1 g/dL 4.1 4.4 4.3  AST 10 - 35 U/L '17 25 25  '$ ALT 9 - 46 U/L 23 33 39  Alk Phosphatase 40 - 115 U/L 95 87 100  Total Bilirubin 0.2 - 1.2 mg/dL 0.3 0.8 0.5  Bilirubin, Direct 0.1 - 0.5 mg/dL - - -    CBC Latest Ref Rng & Units 12/31/2015 12/30/2015 12/29/2015  WBC 4.0 - 10.5 K/uL 7.9 8.0 8.7  Hemoglobin 13.0 - 17.0 g/dL 13.3 13.7 13.7  Hematocrit 39.0 - 52.0 % 38.6(L) 38.3(L) 39.0  Platelets 150 - 400 K/uL 269 254 301   Lab Results  Component Value Date   MCV 83.4 12/31/2015   MCV 85.1 12/30/2015   MCV 83.0 12/29/2015    Lab Results  Component Value Date   TSH 2.27 05/22/2007    BNP    Component Value Date/Time   BNP 42.5 12/30/2015 0453    ProBNP No results found for: PROBNP   Lipid Panel     Component Value Date/Time   CHOL 113 (L) 03/08/2016 1116   TRIG 232 (H) 03/08/2016 1116   HDL 23 (L) 03/08/2016  1116   CHOLHDL 4.9 03/08/2016 1116   VLDL 46 (H) 03/08/2016 1116   LDLCALC 44 03/08/2016 1116   LDLDIRECT 103.1 05/22/2007 0922     RADIOLOGY: No results found.  IMPRESSION:  1. CAD in native artery   2. STEMI 07/09/15   3. Hyperlipidemia LDL goal <70   4. Essential hypertension   5. Cardiomyopathy, ischemic   6. Mural thrombus of cardiac apex   7. Drug therapy   8. Alteration in anticoagulation     ASSESSMENT AND PLAN: Mr Holsopple is a 55 year old male who suffered and anterior ST segment elevation myocardial infarction on 07/09/2015 at which time he underwent successful acute intervention to totally occluded LAD and high-grade diagonal vessel as noted above.  He subsequently underwent staged intervention 2 days later to his RCA.  Clinically, he has felt well without recurrent anginal symptomatology.  He had been started on warfarin earlier this year secondary to echocardiographic demonstration of apical thrombus.  He has been on triple drug therapy.  He is now 13 months following his anterior cartilage infarction.  His most recent nuclear study demonstrates scar concordant with his infarct without associated ischemia.  A follow-up echo Doppler study no longer demonstrates any evidence for apical thrombus.  His INR today was therapeutic at 2.2.  Since he is one year out of his myocardial infarction and staged coronary intervention and does not have any demonstrated residual thrombus, I have suggested discontinuance of warfarin, but that he continue aspirin/Plavix indefinitely.  His blood pressure is low today, but he is asymptomatic on low-dose lisinopril 2.5  mg at bedtime, metoprolol 25 mg twice a day.  His resting pulse is 64.  He continues to be on high potency statin with Lipitor 80 mg with target LDL is 1070.  His most recent lipid panel was excellent with LDL 44.  I have suggested that he undergo a follow-up echo Doppler study in May 3241 to make certain he is not developing any  recurrent thrombus off warfarin therapy and I will see him back in the office for follow-up evaluation.  Time spent: 25 minutes  Troy Sine, MD, [facility]  07/26/2016 7:55 AM

## 2016-08-07 ENCOUNTER — Other Ambulatory Visit: Payer: Self-pay | Admitting: Family Medicine

## 2016-08-07 DIAGNOSIS — I2510 Atherosclerotic heart disease of native coronary artery without angina pectoris: Secondary | ICD-10-CM

## 2016-08-07 DIAGNOSIS — Z9861 Coronary angioplasty status: Principal | ICD-10-CM

## 2016-08-07 MED FILL — CLOPIDOGREL 75 MG TABLET: 75 | 30 days supply | Qty: 30 | Fill #4

## 2016-08-07 MED FILL — OLOPATADINE HCL 0.1% EYE DR: 0.1 | 5 days supply | Qty: 5 | Fill #1

## 2016-08-07 MED FILL — METOPROLOL TARTRATE 25 MG T: 25 | 30 days supply | Qty: 60 | Fill #2

## 2016-08-07 MED FILL — LISINOPRIL 2.5 MG TABLET: 2.5 | 30 days supply | Qty: 30 | Fill #0

## 2016-08-07 MED FILL — ATORVASTATIN 80 MG TABLET: 80 | 30 days supply | Qty: 30 | Fill #0

## 2016-08-07 MED FILL — FAMOTIDINE 20 MG TABLET: 20 | 30 days supply | Qty: 60 | Fill #0

## 2016-08-07 MED FILL — TAMSULOSIN HCL 0.4 MG CAP: 0.4 | 30 days supply | Qty: 30 | Fill #3

## 2016-08-07 MED FILL — ?HYDROXYZINE HCL 25 MG TAB: 25 | 20 days supply | Qty: 60 | Fill #0

## 2016-08-07 MED FILL — FLUTICASONE PROP 50 MCG SPR: 50 | 30 days supply | Qty: 16 | Fill #1

## 2016-08-07 MED FILL — ?CETIRIZINE HCL 10 MG TABLE: 10 | 30 days supply | Qty: 30 | Fill #2

## 2016-08-07 MED FILL — ?SERTRALINE HCL 50 MG TABLE: 50 | 30 days supply | Qty: 30 | Fill #2

## 2016-08-07 MED FILL — NITROSTAT 0.4 MG TABLET SL: 0.4 | 25 days supply | Qty: 25 | Fill #0

## 2016-08-13 ENCOUNTER — Other Ambulatory Visit: Payer: Self-pay | Admitting: Family Medicine

## 2016-08-13 DIAGNOSIS — R0781 Pleurodynia: Secondary | ICD-10-CM

## 2016-08-13 MED FILL — BUPROPION SR 150 MG TABLET: 150 | 30 days supply | Qty: 60 | Fill #1

## 2016-08-13 MED FILL — ?CYCLOBENZAPRINE 10 MG TABL: 10 | 30 days supply | Qty: 60 | Fill #0

## 2016-08-27 ENCOUNTER — Encounter: Payer: Self-pay | Admitting: Family Medicine

## 2016-08-27 ENCOUNTER — Encounter: Payer: Self-pay | Admitting: Licensed Clinical Social Worker

## 2016-08-27 ENCOUNTER — Other Ambulatory Visit: Payer: Self-pay

## 2016-08-27 ENCOUNTER — Ambulatory Visit: Payer: No Typology Code available for payment source | Attending: Family Medicine | Admitting: Family Medicine

## 2016-08-27 VITALS — BP 98/63 | HR 58 | Temp 97.5°F | Ht 70.0 in | Wt 234.8 lb

## 2016-08-27 DIAGNOSIS — E7800 Pure hypercholesterolemia, unspecified: Secondary | ICD-10-CM | POA: Insufficient documentation

## 2016-08-27 DIAGNOSIS — E663 Overweight: Secondary | ICD-10-CM | POA: Insufficient documentation

## 2016-08-27 DIAGNOSIS — R399 Unspecified symptoms and signs involving the genitourinary system: Secondary | ICD-10-CM

## 2016-08-27 DIAGNOSIS — I252 Old myocardial infarction: Secondary | ICD-10-CM | POA: Insufficient documentation

## 2016-08-27 DIAGNOSIS — F418 Other specified anxiety disorders: Secondary | ICD-10-CM

## 2016-08-27 DIAGNOSIS — F419 Anxiety disorder, unspecified: Secondary | ICD-10-CM | POA: Insufficient documentation

## 2016-08-27 DIAGNOSIS — I2510 Atherosclerotic heart disease of native coronary artery without angina pectoris: Secondary | ICD-10-CM

## 2016-08-27 DIAGNOSIS — R0982 Postnasal drip: Secondary | ICD-10-CM

## 2016-08-27 DIAGNOSIS — Z79899 Other long term (current) drug therapy: Secondary | ICD-10-CM | POA: Insufficient documentation

## 2016-08-27 DIAGNOSIS — F329 Major depressive disorder, single episode, unspecified: Secondary | ICD-10-CM | POA: Insufficient documentation

## 2016-08-27 DIAGNOSIS — Z9861 Coronary angioplasty status: Secondary | ICD-10-CM

## 2016-08-27 DIAGNOSIS — Z7982 Long term (current) use of aspirin: Secondary | ICD-10-CM | POA: Insufficient documentation

## 2016-08-27 DIAGNOSIS — Z7902 Long term (current) use of antithrombotics/antiplatelets: Secondary | ICD-10-CM | POA: Insufficient documentation

## 2016-08-27 DIAGNOSIS — R0789 Other chest pain: Secondary | ICD-10-CM

## 2016-08-27 MED ORDER — FLUTICASONE PROPIONATE 50 MCG/ACT NA SUSP: NASAL | 3 refills | Status: DC

## 2016-08-27 MED ORDER — LISINOPRIL 2.5 MG PO TABS: 2.5000 mg | ORAL_TABLET | Freq: Every day | ORAL | 3 refills | Status: DC

## 2016-08-27 MED ORDER — CETIRIZINE HCL 10 MG PO TABS: 10.0000 mg | ORAL_TABLET | Freq: Every day | ORAL | 3 refills | Status: DC

## 2016-08-27 MED ORDER — METOPROLOL TARTRATE 25 MG PO TABS: 25.0000 mg | ORAL_TABLET | Freq: Two times a day (BID) | ORAL | 3 refills | Status: DC

## 2016-08-27 MED ORDER — TAMSULOSIN HCL 0.4 MG PO CAPS: 0.4000 mg | ORAL_CAPSULE | Freq: Every day | ORAL | 3 refills | Status: DC

## 2016-08-27 MED ORDER — SERTRALINE HCL 100 MG PO TABS: 100.0000 mg | ORAL_TABLET | Freq: Every day | ORAL | 3 refills | Status: DC

## 2016-08-27 NOTE — Patient Instructions (Signed)
Major Depressive Disorder, Adult Major depressive disorder (MDD) is a mental health condition. It may also be called clinical depression or unipolar depression. MDD usually causes feelings of sadness, hopelessness, or helplessness. MDD can also cause physical symptoms. It can interfere with work, school, relationships, and other everyday activities. MDD may be mild, moderate, or severe. It may occur once (single episode major depressive disorder) or it may occur multiple times (recurrent major depressive disorder). What are the causes? The exact cause of this condition is not known. MDD is most likely caused by a combination of things, which may include:  Genetic factors. These are traits that are passed along from parent to child.  Individual factors. Your personality, your behavior, and the way you handle your thoughts and feelings may contribute to MDD. This includes personality traits and behaviors learned from others.  Physical factors, such as: ? Differences in the part of your brain that controls emotion. This part of your brain may be different than it is in people who do not have MDD. ? Long-term (chronic) medical or psychiatric illnesses.  Social factors. Traumatic experiences or major life changes may play a role in the development of MDD.  What increases the risk? This condition is more likely to develop in women. The following factors may also make you more likely to develop MDD:  A family history of depression.  Troubled family relationships.  Abnormally low levels of certain brain chemicals.  Traumatic events in childhood, especially abuse or the loss of a parent.  Being under a lot of stress, or long-term stress, especially from upsetting life experiences or losses.  A history of: ? Chronic physical illness. ? Other mental health disorders. ? Substance abuse.  Poor living conditions.  Experiencing social exclusion or discrimination on a regular basis.  What are  the signs or symptoms? The main symptoms of MDD typically include:  Constant depressed or irritable mood.  Loss of interest in things and activities.  MDD symptoms may also include:  Sleeping or eating too much or too little.  Unexplained weight change.  Fatigue or low energy.  Feelings of worthlessness or guilt.  Difficulty thinking clearly or making decisions.  Thoughts of suicide or of harming others.  Physical agitation or weakness.  Isolation.  Severe cases of MDD may also occur with other symptoms, such as:  Delusions or hallucinations, in which you imagine things that are not real (psychotic depression).  Low-level depression that lasts at least a year (chronic depression or persistent depressive disorder).  Extreme sadness and hopelessness (melancholic depression).  Trouble speaking and moving (catatonic depression).  How is this diagnosed? This condition may be diagnosed based on:  Your symptoms.  Your medical history, including your mental health history. This may involve tests to evaluate your mental health. You may be asked questions about your lifestyle, including any drug and alcohol use, and how long you have had symptoms of MDD.  A physical exam.  Blood tests to rule out other conditions.  You must have a depressed mood and at least four other MDD symptoms most of the day, nearly every day in the same 2-week timeframe before your health care provider can confirm a diagnosis of MDD. How is this treated? This condition is usually treated by mental health professionals, such as psychologists, psychiatrists, and clinical social workers. You may need more than one type of treatment. Treatment may include:  Psychotherapy. This is also called talk therapy or counseling. Types of psychotherapy include: ? Cognitive behavioral   therapy (CBT). This type of therapy teaches you to recognize unhealthy feelings, thoughts, and behaviors, and replace them with  positive thoughts and actions. ? Interpersonal therapy (IPT). This helps you to improve the way you relate to and communicate with others. ? Family therapy. This treatment includes members of your family.  Medicine to treat anxiety and depression, or to help you control certain emotions and behaviors.  Lifestyle changes, such as: ? Limiting alcohol and drug use. ? Exercising regularly. ? Getting plenty of sleep. ? Making healthy eating choices. ? Spending more time outdoors.  Treatments involving stimulation of the brain can be used in situations with extremely severe symptoms, or when medicine or other therapies do not work over time. These treatments include electroconvulsive therapy, transcranial magnetic stimulation, and vagal nerve stimulation. Follow these instructions at home: Activity  Return to your normal activities as told by your health care provider.  Exercise regularly and spend time outdoors as told by your health care provider. General instructions  Take over-the-counter and prescription medicines only as told by your health care provider.  Do not drink alcohol. If you drink alcohol, limit your alcohol intake to no more than 1 drink a day for nonpregnant women and 2 drinks a day for men. One drink equals 12 oz of beer, 5 oz of wine, or 1 oz of hard liquor. Alcohol can affect any antidepressant medicines you are taking. Talk to your health care provider about your alcohol use.  Eat a healthy diet and get plenty of sleep.  Find activities that you enjoy doing, and make time to do them.  Consider joining a support group. Your health care provider may be able to recommend a support group.  Keep all follow-up visits as told by your health care provider. This is important. Where to find more information: National Alliance on Mental Illness  www.nami.org  [place]. National Institute of Mental Health  www.nimh.nih.gov  National Suicide Prevention  Lifeline  1-800-273-TALK (8255). This is free, 24-hour help.  Contact a health care provider if:  Your symptoms get worse.  You develop new symptoms. Get help right away if:  You self-harm.  You have serious thoughts about hurting yourself or others.  You see, hear, taste, smell, or feel things that are not present (hallucinate). This information is not intended to replace advice given to you by your health care provider. Make sure you discuss any questions you have with your health care provider. Document Released: 11/23/2012 Document Revised: 04/04/2016 Document Reviewed: 02/07/2016 Elsevier Interactive Patient Education  2017 [employer]  

## 2016-08-27 NOTE — Progress Notes (Signed)
Subjective:  Patient ID: Henry David, male    DOB: 08-10-1961  Age: 56 y.o. MRN: [id number]  CC: Depression; Anxiety; and Coronary Artery Disease   HPI [name] presents for a follow-up of depression and anxiety. Medical history is significant for GERD, coronary artery disease status post PCI with DES to LAD and RCA in 06/2015 (currently on Plavix), depression and anxiety, left apical mural thrombus diagnosed in 12/2015 (completed course of anticoagulation with Coumadin in 07/2016).  Regarding his depression he remains on Wellbutrin, hydroxyzine and Zoloft and had presented to [facility] for an initial assessment but never went back to see the psychiatrist. He is still depressed especially when he looks around and sees the belongings of his deceased parents in the house. Denies suicidal ideation or intent.  He complains of chest tightness which is intermittent x4 days and unrelated to physical activity.  Denies shortness of breath.  His exercise tolerance is affected by his mood as he finds himself sleeping for the most part of the day; really gets to go out of the house. Denies pedal edema or orthopnea.  Past Medical History:  Diagnosis Date  . Anxiety   . Apical mural thrombus   . BPH (benign prostatic hyperplasia)   . Depression   . Diverticulitis   . Elevated cholesterol   . Essential hypertension   . Post traumatic stress disorder   . STEMI (ST elevation myocardial infarction) [facility])     Past Surgical History:  Procedure Laterality Date  . CARDIAC CATHETERIZATION N/A 07/09/2015   Procedure: Left Heart Cath and Coronary Angiography;  Surgeon: Lennette Bihari, MD;  Location: [facility] INVASIVE CV LAB;  Service: Cardiovascular;  Laterality: N/A;  . CARDIAC CATHETERIZATION N/A 07/09/2015   Procedure: Coronary Stent Intervention;  Surgeon: Lennette Bihari, MD;  Location: [facility] INVASIVE CV LAB;  Service: Cardiovascular;  Laterality: N/A;  . CARDIAC CATHETERIZATION N/A 07/11/2015   Procedure: Coronary Stent Intervention;  Surgeon: Lennette Bihari, MD;  Location: [facility] INVASIVE CV LAB;  Service: Cardiovascular;  Laterality: N/A;  . KNEE SURGERY       Allergies  Allergen Reactions  . Coconut Fatty Acids     unknown     Outpatient Medications Prior to Visit  Medication Sig Dispense Refill  . acetaminophen (TYLENOL) 500 MG tablet Take 500 mg by mouth every 6 (six) hours as needed.    Marland Kitchen aspirin 81 MG EC tablet Take 1 tablet (81 mg total) by mouth daily. 30 tablet 0  . atorvastatin (LIPITOR) 80 MG tablet Take 1 tablet (80 mg total) by mouth daily at 6 PM. Must have office visit 30 tablet 3  . buPROPion (WELLBUTRIN SR) 150 MG 12 hr tablet Take 1 tablet (150 mg total) by mouth 2 (two) times daily. 60 tablet 3  . clopidogrel (PLAVIX) 75 MG tablet Take 1 tablet (75 mg total) by mouth daily. 90 tablet 0  . cyclobenzaprine (FLEXERIL) 10 MG tablet TAKE 1 TABLET BY MOUTH 2 TIMES DAILY AS NEEDED FOR MUSCLE SPASMS. 60 tablet 0  . famotidine (PEPCID) 20 MG tablet TAKE 1 TABLET BY MOUTH TWICE DAILY. 60 tablet 2  . hydrOXYzine (ATARAX/VISTARIL) 25 MG tablet Take 1 tablet (25 mg total) by mouth 3 (three) times daily as needed. 60 tablet 3  . NITROSTAT 0.4 MG SL tablet PLACE 1 TABLET UNDER THE TONGUE EVERY 5 MINUTES AS NEEDED FOR CHEST PAIN. MAX-3 DOSES WITHIN A 15 MINUTE INTERVAL 25 tablet 0  . olopatadine (PATANOL) 0.1 %  ophthalmic solution PLACE 1 DROP INTO BOTH EYES 2 TIMES DAILY. 15 mL 0  . cetirizine (ZYRTEC) 10 MG tablet Take 1 tablet (10 mg total) by mouth daily. 30 tablet 3  . fluticasone (FLONASE) 50 MCG/ACT nasal spray PLACE 2 SPRAYS INTO BOTH NOSTRILS DAILY. 16 g 3  . hydrOXYzine (ATARAX/VISTARIL) 25 MG tablet TAKE 1 TABLET BY MOUTH 3 TIMES DAILY AS NEEDED. 60 tablet 3  . lisinopril (PRINIVIL,ZESTRIL) 2.5 MG tablet Take 1 tablet (2.5 mg total) by mouth at bedtime. 30 tablet 3  . metoprolol tartrate (LOPRESSOR) 25 MG tablet Take 1 tablet (25 mg total) by mouth 2 (two) times  daily. 60 tablet 3  . sertraline (ZOLOFT) 50 MG tablet Take 1 tablet (50 mg total) by mouth daily. 30 tablet 3  . tamsulosin (FLOMAX) 0.4 MG CAPS capsule Take 1 capsule (0.4 mg total) by mouth daily. 30 capsule 3   No facility-administered medications prior to visit.     ROS Review of Systems Constitutional: Negative for activity change and appetite change.  HENT: Negative for sinus pressure and sore throat.   Eyes: Negative for visual disturbance.  positive for itchy eyes Respiratory: Negative for cough, chest tightness and shortness of breath.   Cardiovascular: Positive for chest pain and negative for leg swelling.  Gastrointestinal: Negative for abdominal pain, diarrhea, constipation and abdominal distention.  Endocrine: Negative.   Genitourinary: Negative for dysuria.  Musculoskeletal: see hpi  Skin: Negative for rash.  Allergic/Immunologic: Negative.   Neurological: Negative for weakness, light-headedness and numbness.  Psychiatric/Behavioral: Positive for dysphoric mood. Negative for suicidal ideas.   Objective:  BP 98/63 (BP Location: Right Arm, Patient Position: Sitting, Cuff Size: Large)   Pulse (!) 58   Temp 97.5 F (36.4 C) (Oral)   Ht 5\' 10"  (1.778 m)   Wt 234 lb 12.8 oz (106.5 kg)   SpO2 98%   BMI 33.69 kg/m   BP/Weight 08/27/2016 07/24/2016 06/25/2016  Systolic BP 98 144 -  Diastolic BP 63 72 -  Wt. (Lbs) 234.8 238.4 233  BMI 33.69 34.21 35.43      Physical Exam Constitutional: He is oriented to person, place, and time. He appears well-developed and well-nourished.  Cardiovascular: Normal rate, normal heart sounds and intact distal pulses.   No murmur heard. Pulmonary/Chest: Effort normal and breath sounds normal. He has no wheezes. He has no rales. He exhibits no tenderness.  Abdominal: Soft. Bowel sounds are normal. He exhibits no distension and no mass. There is no tenderness.  Musculoskeletal: Normal range of motion.  Neurological: He is alert and  oriented to person, place, and time.  Skin: Skin is warm and dry.  Psychiatric: He has a depressed mood  Assessment & Plan:   1. CAD S/P LAD and RCA DES EKG at this time is unchanged from previous Follow-up with cardiology - metoprolol tartrate (LOPRESSOR) 25 MG tablet; Take 1 tablet (25 mg total) by mouth 2 (two) times daily.  Dispense: 60 tablet; Refill: 3 - lisinopril (PRINIVIL,ZESTRIL) 2.5 MG tablet; Take 1 tablet (2.5 mg total) by mouth at bedtime.  Dispense: 30 tablet; Refill: 3  2. Depression with anxiety Uncontrolled Patient has been seen by the LCSW several times; Lacks motivation Increase Zoloft He has been encouraged to present to [facility] for optimization of management. - sertraline (ZOLOFT) 100 MG tablet; Take 1 tablet (100 mg total) by mouth daily.  Dispense: 30 tablet; Refill: 3  3. Lower urinary tract symptoms Controlled - tamsulosin (FLOMAX) 0.4 MG CAPS capsule; Take  1 capsule (0.4 mg total) by mouth daily.  Dispense: 30 capsule; Refill: 3  4. Post-nasal drip Stable - cetirizine (ZYRTEC) 10 MG tablet; Take 1 tablet (10 mg total) by mouth daily.  Dispense: 30 tablet; Refill: 3 - fluticasone (FLONASE) 50 MCG/ACT nasal spray; PLACE 2 SPRAYS INTO BOTH NOSTRILS DAILY.  Dispense: 16 g; Refill: 3  5. Other chest pain EKG-septal infarct, unchanged from EKG of 04/2016  6. Overweight Advised to work on losing weight, reducing portion sizes We have discussed exercising (walking) 2 times per week until his next visit with me   Meds ordered this encounter  Medications  . metoprolol tartrate (LOPRESSOR) 25 MG tablet    Sig: Take 1 tablet (25 mg total) by mouth 2 (two) times daily.    Dispense:  60 tablet    Refill:  3  . lisinopril (PRINIVIL,ZESTRIL) 2.5 MG tablet    Sig: Take 1 tablet (2.5 mg total) by mouth at bedtime.    Dispense:  30 tablet    Refill:  3  . sertraline (ZOLOFT) 100 MG tablet    Sig: Take 1 tablet (100 mg total) by mouth daily.    Dispense:  30  tablet    Refill:  3  . tamsulosin (FLOMAX) 0.4 MG CAPS capsule    Sig: Take 1 capsule (0.4 mg total) by mouth daily.    Dispense:  30 capsule    Refill:  3  . cetirizine (ZYRTEC) 10 MG tablet    Sig: Take 1 tablet (10 mg total) by mouth daily.    Dispense:  30 tablet    Refill:  3  . fluticasone (FLONASE) 50 MCG/ACT nasal spray    Sig: PLACE 2 SPRAYS INTO BOTH NOSTRILS DAILY.    Dispense:  16 g    Refill:  3    Follow-up: Return in about 1 month (around 09/27/2016) for Follow-up on weight and exercise.   Jaclyn Shaggy MD

## 2016-08-27 NOTE — BH Specialist Note (Signed)
Session Start time: 4:10 PM   End Time: 4:50 PM Total Time:  40 minutes Type of Service: Behavioral Health - Individual/Family Interpreter: No.   Interpreter Name & Language: N/A # [facility] Visits July 2017-June 2018: 1st   SUBJECTIVE: Henry David is a 56 y.o. male  Pt. was referred by Dr. Venetia Night for:  anxiety and depression. Pt. reports the following symptoms/concerns: irritability, panic attacks, withdrawn, difficulty sleeping, racing thoughts, and weight gain Duration of problem:  "1987" Severity: severe Previous treatment: Pt was [facility] at [facility] and [facility]; however, did not continue with psychotherapy or medication management.   OBJECTIVE: Mood: Anxious & Affect: Depressed Risk of harm to self or others: Pt has history of suicidal ideations. Pt denies plan or intent to self-harm and/or harm others Assessments administered: PHQ-9; GAD-7  LIFE CONTEXT:  Family & Social: Pt has stable housing. His parents are deceased and pt reports not having one friend.  School/ Work: Pt is unemployed. 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 Pt receives financial support from a friend  Self-Care: Pt has difficulty sleeping and does not have an exercise regimen. He reported weight gain. No report of substance use Life changes: Pt was hospitalized for having a heart attack one year ago. He has limited support What is important to pt/family (values): Independence   GOALS ADDRESSED:  Decrease symptoms of depression Decrease symptoms of anxiety  INTERVENTIONS: Solution Focused, Strength-based and Supportive   ASSESSMENT:  Pt currently experiencing depression and anxiety. Pt reports irritability, panic attacks, withdrawn, difficulty sleeping, racing thoughts, weight gain, and suicidal ideations with no plan. Pt has limited support. Pt may benefit from psychotherapy and medication management. He has received evaluations through [facility] and [facility]; however, did not continue with  psychotherapy or medication management services. LCSWA educated pt on the cycle of depression and anxiety and inquired protective factors with pt. Pt denied plan to complete suicide stating, "I understand that's a permanent decision to a temporary issue" Pt verbalized safety plan and was provided crisis intervention resources. LCSWA encouraged pt to re-initiate behavioral health services.LCSWA discussed benefits of implementing healthy coping skills to decrease symptoms and improve mental and physical health. Pt was provided resources to apply for disability and utility assistance. Pt received a food box and additional resources for food insecurity.      PLAN: 1. F/U with behavioral health clinician: Pt was encouraged to contact LCSWA if symptoms worsen or fail to improve to schedule behavioral appointments at [facility]. 2. Behavioral Health meds: Wellbutrin and Zoloft 3. Behavioral recommendations: LCSWA recommends that pt apply healthy coping skills discussed and re-initiate behavioral health services. Pt is encouraged to schedule follow up appointment with LCSWA 4. Referral: Brief Counseling/Psychotherapy, [employer], Problem-solving teaching/coping strategies, Psychoeducation and Supportive Counseling 5. From scale of 1-10, how likely are you to follow plan: 7/10   Bridgett Larsson, MSW, [facility]  Clinical Social Worker 08/30/16 4:58 PM  Warmhandoff:   Warm Hand Off Completed.

## 2016-08-27 NOTE — Progress Notes (Signed)
Would like to start exercising but would like ins to pay for it.

## 2016-08-28 ENCOUNTER — Ambulatory Visit: Payer: No Typology Code available for payment source

## 2016-08-30 ENCOUNTER — Other Ambulatory Visit: Payer: Self-pay | Admitting: Family Medicine

## 2016-08-30 DIAGNOSIS — H1013 Acute atopic conjunctivitis, bilateral: Secondary | ICD-10-CM

## 2016-09-09 ENCOUNTER — Ambulatory Visit: Payer: No Typology Code available for payment source

## 2016-09-10 ENCOUNTER — Other Ambulatory Visit: Payer: Self-pay | Admitting: Family Medicine

## 2016-09-10 DIAGNOSIS — R0781 Pleurodynia: Secondary | ICD-10-CM

## 2016-09-11 MED FILL — ?CYCLOBENZAPRINE 10 MG TABL: 10 | 30 days supply | Qty: 60 | Fill #0

## 2016-09-12 ENCOUNTER — Other Ambulatory Visit: Payer: Self-pay | Admitting: Family Medicine

## 2016-09-12 DIAGNOSIS — H1013 Acute atopic conjunctivitis, bilateral: Secondary | ICD-10-CM

## 2016-09-12 MED FILL — BUPROPION SR 150 MG TABLET: 150 | 30 days supply | Qty: 60 | Fill #2

## 2016-09-12 MED FILL — OLOPATADINE HCL 0.1% EYE DR: 0.1 | 10 days supply | Qty: 5 | Fill #0

## 2016-09-13 ENCOUNTER — Ambulatory Visit: Payer: No Typology Code available for payment source | Attending: Family Medicine

## 2016-09-13 MED FILL — FAMOTIDINE 20 MG TABLET: 20 | 30 days supply | Qty: 60 | Fill #1

## 2016-09-13 MED FILL — NITROSTAT 0.4 MG TABLET SL: 0.4 | 25 days supply | Qty: 25 | Fill #0

## 2016-09-13 MED FILL — METOPROLOL TARTRATE 25 MG T: 25 | 30 days supply | Qty: 60 | Fill #3

## 2016-09-13 MED FILL — TAMSULOSIN HCL 0.4 MG CAP: 0.4 | 30 days supply | Qty: 30 | Fill #0

## 2016-09-13 MED FILL — ?SERTRALINE HCL 50 MG TABLE: 50 | 30 days supply | Qty: 30 | Fill #3

## 2016-09-13 MED FILL — hydrOXYzine HCL 25 MG TABS: 25 | 20 days supply | Qty: 60 | Fill #1

## 2016-09-13 MED FILL — ATORVASTATIN 80 MG TABLET: 80 | 30 days supply | Qty: 30 | Fill #1

## 2016-09-13 MED FILL — CLOPIDOGREL 75 MG TABLET: 75 | 30 days supply | Qty: 30 | Fill #5

## 2016-09-13 MED FILL — FLUTICASONE PROP 50 MCG SPR: 50 | 30 days supply | Qty: 16 | Fill #0

## 2016-09-13 MED FILL — ?CETIRIZINE HCL 10 MG TABLE: 10 | 30 days supply | Qty: 30 | Fill #3

## 2016-09-13 MED FILL — LISINOPRIL 2.5 MG TABLET: 2.5 | 30 days supply | Qty: 30 | Fill #0

## 2016-10-09 ENCOUNTER — Other Ambulatory Visit: Payer: Self-pay | Admitting: Family Medicine

## 2016-10-09 DIAGNOSIS — Z9861 Coronary angioplasty status: Secondary | ICD-10-CM

## 2016-10-09 DIAGNOSIS — R0781 Pleurodynia: Secondary | ICD-10-CM

## 2016-10-09 DIAGNOSIS — I2510 Atherosclerotic heart disease of native coronary artery without angina pectoris: Secondary | ICD-10-CM

## 2016-10-10 MED FILL — ?CYCLOBENZAPRINE 10 MG TABL: 10 | 30 days supply | Qty: 60 | Fill #0

## 2016-10-10 MED FILL — NITROSTAT 0.4 MG TABLET SL: 0.4 | 25 days supply | Qty: 25 | Fill #0

## 2016-10-14 MED FILL — BUPROPION SR 150 MG TABLET: 150 | 30 days supply | Qty: 60 | Fill #3

## 2016-10-14 MED FILL — LISINOPRIL 2.5 MG TABLET: 2.5 | 30 days supply | Qty: 30 | Fill #1

## 2016-10-14 MED FILL — ?TAMSULOSIN HCL 0.4 MG CAP: 0.4 | 30 days supply | Qty: 30 | Fill #1

## 2016-10-14 MED FILL — ?CETIRIZINE HCL 10 MG TABLE: 10 | 30 days supply | Qty: 30 | Fill #0

## 2016-10-14 MED FILL — ?SERTRALINE HCL 50 MG TABLE: 50 | 30 days supply | Qty: 30 | Fill #0

## 2016-10-14 MED FILL — CLOPIDOGREL 75 MG TABLET: 75 | 30 days supply | Qty: 30 | Fill #6

## 2016-10-14 MED FILL — ?METOPROLOL 25 MG TABLET: 25 | 30 days supply | Qty: 60 | Fill #0

## 2016-10-14 MED FILL — ATORVASTATIN 80 MG TABLET: 80 | 30 days supply | Qty: 30 | Fill #2

## 2016-10-21 MED FILL — OLOPATADINE HCL 0.1% EYE DR: 0.1 | 10 days supply | Qty: 5 | Fill #1

## 2016-10-30 ENCOUNTER — Other Ambulatory Visit: Payer: Self-pay | Admitting: Family Medicine

## 2016-10-30 DIAGNOSIS — R0781 Pleurodynia: Secondary | ICD-10-CM

## 2016-10-30 MED FILL — ?FAMOTIDINE 20 MG TABLET: 20 | 30 days supply | Qty: 60 | Fill #2

## 2016-10-30 MED FILL — FLUTICASONE PROP 50 MCG SPR: 50 | 30 days supply | Qty: 16 | Fill #1

## 2016-10-30 MED FILL — hydrOXYzine HCL 25 MG TABS: 25 | 20 days supply | Qty: 60 | Fill #2

## 2016-10-31 MED FILL — NITROSTAT 0.4 MG TABLET SL: 0.4 | 25 days supply | Qty: 25 | Fill #0

## 2016-10-31 MED FILL — ?CYCLOBENZAPRINE 10 MG TABL: 10 | 30 days supply | Qty: 60 | Fill #0

## 2016-11-05 ENCOUNTER — Other Ambulatory Visit: Payer: Self-pay | Admitting: Family Medicine

## 2016-11-05 MED FILL — ATORVASTATIN 80 MG TABLET: 80 | 30 days supply | Qty: 30 | Fill #0

## 2016-11-05 MED FILL — TAMSULOSIN HCL 0.4 MG CAP: 0.4 | 30 days supply | Qty: 30 | Fill #2

## 2016-11-05 MED FILL — ?SERTRALINE HCL 50 MG TABLE: 50 | 30 days supply | Qty: 30 | Fill #1

## 2016-11-05 MED FILL — METOPROLOL TARTRATE 25 MG T: 25 | 30 days supply | Qty: 60 | Fill #1

## 2016-11-05 MED FILL — LISINOPRIL 2.5 MG TABLET: 2.5 | 30 days supply | Qty: 30 | Fill #2

## 2016-11-05 MED FILL — BUPROPION SR 150 MG TABLET: 150 | 30 days supply | Qty: 60 | Fill #0

## 2016-11-05 MED FILL — CLOPIDOGREL 75 MG TABLET: 75 | 30 days supply | Qty: 30 | Fill #7

## 2016-11-13 ENCOUNTER — Telehealth: Payer: Self-pay | Admitting: Cardiovascular Disease

## 2016-11-13 DIAGNOSIS — I10 Essential (primary) hypertension: Secondary | ICD-10-CM

## 2016-11-13 DIAGNOSIS — E785 Hyperlipidemia, unspecified: Secondary | ICD-10-CM

## 2016-11-13 DIAGNOSIS — Z79899 Other long term (current) drug therapy: Secondary | ICD-10-CM

## 2016-11-13 NOTE — Telephone Encounter (Signed)
New message    Pt is calling to find out if he should have blood work before appt in May with Dr. Tresa Endo.

## 2016-11-13 NOTE — Telephone Encounter (Signed)
Left message on voice mail - labs are need before   placed the order  at [facility]- lipid , cmp , tsh ,cbc Patient did not get labs from 07/2016. If any other question may call back

## 2016-12-11 ENCOUNTER — Other Ambulatory Visit: Payer: Self-pay | Admitting: Family Medicine

## 2016-12-11 DIAGNOSIS — K219 Gastro-esophageal reflux disease without esophagitis: Secondary | ICD-10-CM

## 2016-12-11 MED FILL — BUPROPION SR 150 MG TABLET: 150 | 30 days supply | Qty: 60 | Fill #1

## 2016-12-11 MED FILL — FLUTICASONE PROP 50 MCG SPR: 50 | 30 days supply | Qty: 16 | Fill #2

## 2016-12-11 MED FILL — METOPROLOL TARTRATE 25 MG T: 25 | 30 days supply | Qty: 60 | Fill #2

## 2016-12-11 MED FILL — NITROSTAT 0.4 MG TABLET SL: 0.4 | 25 days supply | Qty: 25 | Fill #0

## 2016-12-11 MED FILL — FAMOTIDINE 20 MG TABLET: 20 | 30 days supply | Qty: 60 | Fill #0

## 2016-12-11 MED FILL — TAMSULOSIN HCL 0.4 MG CAP: 0.4 | 30 days supply | Qty: 30 | Fill #3

## 2016-12-11 MED FILL — ?SERTRALINE HCL 50 MG TABLE: 50 | 30 days supply | Qty: 30 | Fill #2

## 2016-12-11 MED FILL — ATORVASTATIN 80 MG TABLET: 80 | 30 days supply | Qty: 30 | Fill #1

## 2016-12-11 MED FILL — ?HYDROXYZINE HCL 25 MG TAB: 25 MG | 20 days supply | Qty: 60 | Fill #3

## 2016-12-11 MED FILL — ?CETIRIZINE HCL 10 MG TABLE: 10 | 30 days supply | Qty: 30 | Fill #1

## 2016-12-11 MED FILL — LISINOPRIL 2.5 MG TABLET: 2.5 | 30 days supply | Qty: 30 | Fill #3

## 2016-12-11 MED FILL — CLOPIDOGREL 75 MG TABLET: 75 | 30 days supply | Qty: 30 | Fill #8

## 2016-12-11 MED FILL — OLOPATADINE HCL 0.1% EYE DR: 0.1 | 10 days supply | Qty: 5 | Fill #2

## 2016-12-12 ENCOUNTER — Ambulatory Visit: Payer: Self-pay | Attending: Family Medicine | Admitting: Family Medicine

## 2016-12-12 ENCOUNTER — Encounter: Payer: Self-pay | Admitting: Family Medicine

## 2016-12-12 VITALS — BP 99/63 | HR 65 | Temp 98.1°F | Resp 18 | Ht 68.0 in | Wt 228.0 lb

## 2016-12-12 DIAGNOSIS — F418 Other specified anxiety disorders: Secondary | ICD-10-CM | POA: Insufficient documentation

## 2016-12-12 DIAGNOSIS — N401 Enlarged prostate with lower urinary tract symptoms: Secondary | ICD-10-CM | POA: Insufficient documentation

## 2016-12-12 DIAGNOSIS — M25561 Pain in right knee: Secondary | ICD-10-CM | POA: Insufficient documentation

## 2016-12-12 DIAGNOSIS — I2510 Atherosclerotic heart disease of native coronary artery without angina pectoris: Secondary | ICD-10-CM | POA: Insufficient documentation

## 2016-12-12 DIAGNOSIS — I10 Essential (primary) hypertension: Secondary | ICD-10-CM | POA: Insufficient documentation

## 2016-12-12 DIAGNOSIS — Z6834 Body mass index (BMI) 34.0-34.9, adult: Secondary | ICD-10-CM | POA: Insufficient documentation

## 2016-12-12 DIAGNOSIS — E6609 Other obesity due to excess calories: Secondary | ICD-10-CM | POA: Insufficient documentation

## 2016-12-12 DIAGNOSIS — Z7902 Long term (current) use of antithrombotics/antiplatelets: Secondary | ICD-10-CM | POA: Insufficient documentation

## 2016-12-12 DIAGNOSIS — K219 Gastro-esophageal reflux disease without esophagitis: Secondary | ICD-10-CM | POA: Insufficient documentation

## 2016-12-12 DIAGNOSIS — Z9861 Coronary angioplasty status: Secondary | ICD-10-CM

## 2016-12-12 DIAGNOSIS — I252 Old myocardial infarction: Secondary | ICD-10-CM | POA: Insufficient documentation

## 2016-12-12 DIAGNOSIS — F4310 Post-traumatic stress disorder, unspecified: Secondary | ICD-10-CM | POA: Insufficient documentation

## 2016-12-12 DIAGNOSIS — M1711 Unilateral primary osteoarthritis, right knee: Secondary | ICD-10-CM | POA: Insufficient documentation

## 2016-12-12 DIAGNOSIS — R399 Unspecified symptoms and signs involving the genitourinary system: Secondary | ICD-10-CM

## 2016-12-12 DIAGNOSIS — Z7982 Long term (current) use of aspirin: Secondary | ICD-10-CM | POA: Insufficient documentation

## 2016-12-12 DIAGNOSIS — M62838 Other muscle spasm: Secondary | ICD-10-CM | POA: Insufficient documentation

## 2016-12-12 DIAGNOSIS — Z955 Presence of coronary angioplasty implant and graft: Secondary | ICD-10-CM | POA: Insufficient documentation

## 2016-12-12 DIAGNOSIS — E7800 Pure hypercholesterolemia, unspecified: Secondary | ICD-10-CM | POA: Insufficient documentation

## 2016-12-12 DIAGNOSIS — Z79899 Other long term (current) drug therapy: Secondary | ICD-10-CM | POA: Insufficient documentation

## 2016-12-12 DIAGNOSIS — J3089 Other allergic rhinitis: Secondary | ICD-10-CM

## 2016-12-12 DIAGNOSIS — Z7951 Long term (current) use of inhaled steroids: Secondary | ICD-10-CM | POA: Insufficient documentation

## 2016-12-12 DIAGNOSIS — G8929 Other chronic pain: Secondary | ICD-10-CM | POA: Insufficient documentation

## 2016-12-12 DIAGNOSIS — J302 Other seasonal allergic rhinitis: Secondary | ICD-10-CM | POA: Insufficient documentation

## 2016-12-12 MED ORDER — CLOPIDOGREL BISULFATE 75 MG PO TABS: 75.0000 mg | ORAL_TABLET | Freq: Every day | ORAL | 1 refills | Status: DC

## 2016-12-12 MED ORDER — METOPROLOL TARTRATE 25 MG PO TABS: 25.0000 mg | ORAL_TABLET | Freq: Two times a day (BID) | ORAL | 3 refills | Status: DC

## 2016-12-12 MED ORDER — FAMOTIDINE 20 MG PO TABS: 20.0000 mg | ORAL_TABLET | Freq: Two times a day (BID) | ORAL | 3 refills | Status: DC

## 2016-12-12 MED ORDER — HYDROXYZINE HCL 25 MG PO TABS: 25.0000 mg | ORAL_TABLET | Freq: Three times a day (TID) | ORAL | 3 refills | Status: DC | PRN

## 2016-12-12 MED ORDER — CETIRIZINE HCL 10 MG PO TABS
10.0000 mg | ORAL_TABLET | Freq: Every day | ORAL | 3 refills | Status: DC
Start: 2016-12-12 — End: 2017-03-11

## 2016-12-12 MED ORDER — FLUTICASONE PROPIONATE 50 MCG/ACT NA SUSP: NASAL | 3 refills | Status: DC

## 2016-12-12 MED ORDER — LISINOPRIL 2.5 MG PO TABS: 2.5000 mg | ORAL_TABLET | Freq: Every day | ORAL | 3 refills | Status: DC

## 2016-12-12 MED ORDER — BUPROPION HCL ER (SR) 150 MG PO TB12: 150.0000 mg | ORAL_TABLET | Freq: Two times a day (BID) | ORAL | 3 refills | Status: DC

## 2016-12-12 MED ORDER — SERTRALINE HCL 100 MG PO TABS: 100.0000 mg | ORAL_TABLET | Freq: Every day | ORAL | 3 refills | Status: DC

## 2016-12-12 MED ORDER — ATORVASTATIN CALCIUM 80 MG PO TABS: ORAL_TABLET | ORAL | 3 refills | Status: DC

## 2016-12-12 MED ORDER — CYCLOBENZAPRINE HCL 10 MG PO TABS: ORAL_TABLET | ORAL | 1 refills | Status: DC

## 2016-12-12 MED ORDER — TAMSULOSIN HCL 0.4 MG PO CAPS: 0.4000 mg | ORAL_CAPSULE | Freq: Every day | ORAL | 3 refills | Status: DC

## 2016-12-12 MED FILL — CYCLOBENZAPRINE 10 MG TAB: 10 | 30 days supply | Qty: 60 | Fill #0

## 2016-12-12 NOTE — Progress Notes (Signed)
Pt requesting blood work form cardiology  F/U HTN  Knee pain scale #8 Rt knee  No tobacco user. No ETOH no drughs  Thinking of suicidal for the past 10 year. "But will never do it"

## 2016-12-12 NOTE — Patient Instructions (Signed)

## 2016-12-12 NOTE — Progress Notes (Signed)
Subjective:  Patient ID: Henry David, male    DOB: August 17, 1960  Age: 56 y.o. MRN: [id number]  CC: Follow-up visit  HPI Henry David presents for a follow-up visit. Medical history is significant for GERD, coronary artery disease status post PCI with DES to LAD and RCA in 06/2015 (currently on Plavix), CHF (2-D echo reveals EF of 93-73%, grade 1 diastolic dysfunction from 2-D echo of 06/2016), depression and anxiety, left apical mural thrombus diagnosed in 12/2015 (completed course of anticoagulation with Coumadin in 07/2016).  Regarding his depression he remains on Wellbutrin, hydroxyzine and Zoloft and had presented to [facility] for an initial assessment but never went back to see the psychiatrist. He is still depressed especially when he looks around and sees the belongings of his deceased parents in the house.  Zoloft was increased at his last office visit and he has had previous counseling sessions with the LCSW in house. He informs me he has had suicidal ideations for the last 10 years but has no suicidal intent. In the last 1 week he got out of the house only on one occasion.  He does not exercise, is not compliant with prescribed diet. He has lost 6 pounds surprisingly from his last visit 4 months ago but does not check his weight daily. Denies chest pains but does have some shortness of breath when he bends over to tie his shoe laces. Has not been seen by cardiology since 07/2016 and states he has an upcoming appointment this month.  Complains of right knee pain which is intermittent and absent at this time but worse with walking. Denies swelling.  Past Medical History:  Diagnosis Date  . Anxiety   . Apical mural thrombus   . BPH (benign prostatic hyperplasia)   . Depression   . Diverticulitis   . Elevated cholesterol   . Essential hypertension   . Post traumatic stress disorder   . STEMI (ST elevation myocardial infarction) [facility])     Past Surgical History:  Procedure  Laterality Date  . CARDIAC CATHETERIZATION N/A 07/09/2015   Procedure: Left Heart Cath and Coronary Angiography;  Surgeon: Troy Sine, MD;  Location: [place] [facility];  Service: Cardiovascular;  Laterality: N/A;  . CARDIAC CATHETERIZATION N/A 07/09/2015   Procedure: Coronary Stent Intervention;  Surgeon: Troy Sine, MD;  Location: [place] [facility];  Service: Cardiovascular;  Laterality: N/A;  . CARDIAC CATHETERIZATION N/A 07/11/2015   Procedure: Coronary Stent Intervention;  Surgeon: Troy Sine, MD;  Location: [place] [facility];  Service: Cardiovascular;  Laterality: N/A;  . KNEE SURGERY      Outpatient Medications Prior to Visit  Medication Sig Dispense Refill  . aspirin 81 MG EC tablet Take 1 tablet (81 mg total) by mouth daily. 30 tablet 0  . NITROSTAT 0.4 MG SL tablet PLACE 1 TABLET UNDER THE TONGUE EVERY 5 MINUTES AS NEEDED FOR CHEST PAIN. MAX-3 DOSES WITHIN A 15 MINUTE INTERVAL 25 tablet 0  . olopatadine (PATANOL) 0.1 % ophthalmic solution PLACE 1 DROP INTO BOTH EYES 2 TIMES DAILY. 15 mL 0  . atorvastatin (LIPITOR) 80 MG tablet TAKE 1 TABLET BY MOUTH DAILY AT 6 PM. 30 tablet 3  . buPROPion (WELLBUTRIN SR) 150 MG 12 hr tablet TAKE ONE TABLET BY MOUTH TWICE DAILY 60 tablet 3  . cetirizine (ZYRTEC) 10 MG tablet Take 1 tablet (10 mg total) by mouth daily. 30 tablet 3  . clopidogrel (PLAVIX) 75 MG tablet Take 1 tablet (75  mg total) by mouth daily. 90 tablet 0  . cyclobenzaprine (FLEXERIL) 10 MG tablet TAKE 1 TABLET BY MOUTH 2 TIMES DAILY AS NEEDED FOR MUSCLE SPASMS. 60 tablet 0  . famotidine (PEPCID) 20 MG tablet TAKE 1 TABLET BY MOUTH TWICE DAILY. 60 tablet 2  . fluticasone (FLONASE) 50 MCG/ACT nasal spray PLACE 2 SPRAYS INTO BOTH NOSTRILS DAILY. 16 g 3  . hydrOXYzine (ATARAX/VISTARIL) 25 MG tablet Take 1 tablet (25 mg total) by mouth 3 (three) times daily as needed. 60 tablet 3  . lisinopril (PRINIVIL,ZESTRIL) 2.5 MG tablet Take 1 tablet (2.5 mg total) by mouth at bedtime.  30 tablet 3  . metoprolol tartrate (LOPRESSOR) 25 MG tablet Take 1 tablet (25 mg total) by mouth 2 (two) times daily. 60 tablet 3  . sertraline (ZOLOFT) 100 MG tablet Take 1 tablet (100 mg total) by mouth daily. 30 tablet 3  . tamsulosin (FLOMAX) 0.4 MG CAPS capsule Take 1 capsule (0.4 mg total) by mouth daily. 30 capsule 3  . acetaminophen (TYLENOL) 500 MG tablet Take 500 mg by mouth every 6 (six) hours as needed.     No facility-administered medications prior to visit.     ROS Review of Systems Constitutional: Negative for activity change and appetite change.  HENT: Negative for sinus pressure and sore throat.   Eyes: Negative for visual disturbance.  positive for itchy eyes Respiratory: Negative for cough, chest tightness and shortness of breath.   Cardiovascular: Positive for chest pain and negative for leg swelling.  Gastrointestinal: Negative for abdominal pain, diarrhea, constipation and abdominal distention.  Endocrine: Negative.   Genitourinary: Negative for dysuria.  Musculoskeletal: see hpi  Skin: Negative for rash.  Allergic/Immunologic: Negative.   Neurological: Negative for weakness, light-headedness and numbness.  Psychiatric/Behavioral: Positive for dysphoric mood. Negative for suicidal ideas.   Objective:  BP 99/63 (BP Location: Left Arm, Patient Position: Sitting, Cuff Size: Large)   Pulse 65   Temp 98.1 F (36.7 C) (Oral)   Resp 18   Ht '5\' 8"'$  (1.727 m)   Wt 228 lb (103.4 kg)   SpO2 97%   BMI 34.67 kg/m   BP/Weight 12/12/2016 08/27/2016 10/62/6948  Systolic BP 99 98 546  Diastolic BP 63 63 72  Wt. (Lbs) 228 234.8 238.4  BMI 34.67 33.69 34.21     Physical Exam Constitutional: He is oriented to person, place, and time. He appears well-developed and well-nourished.  Cardiovascular: Normal rate, normal heart sounds and intact distal pulses.   No murmur heard. Pulmonary/Chest: Effort normal and breath sounds normal. He has no wheezes. He has no rales. He  exhibits no tenderness.  Abdominal: Soft. Bowel sounds are normal. He exhibits no distension and no mass. There is no tenderness.  Musculoskeletal: Crepitus on range of motion of the right knee; no edema, no tenderness. Left knee is normal  Neurological: He is alert and oriented to person, place, and time.  Skin: Skin is warm and dry.  Psychiatric: He has a depressed mood  CMP Latest Ref Rng & Units 03/08/2016 12/30/2015 12/29/2015  Glucose 65 - 99 mg/dL 110(H) 110(H) 105(H)  BUN 7 - 25 mg/dL 12 19 21(H)  Creatinine 0.70 - 1.33 mg/dL 1.05 0.97 1.08  Sodium 135 - 146 mmol/L 138 137 137  Potassium 3.5 - 5.3 mmol/L 4.6 3.9 4.0  Chloride 98 - 110 mmol/L 105 105 106  CO2 20 - 31 mmol/L '24 24 24  '$ Calcium 8.6 - 10.3 mg/dL 8.9 9.0 9.1  Total Protein 6.1 -  8.1 g/dL 7.1 - 7.8  Total Bilirubin 0.2 - 1.2 mg/dL 0.3 - 0.8  Alkaline Phos 40 - 115 U/L 95 - 87  AST 10 - 35 U/L 17 - 25  ALT 9 - 46 U/L 23 - 33    Lipid Panel     Component Value Date/Time   CHOL 113 (L) 03/08/2016 1116   TRIG 232 (H) 03/08/2016 1116   HDL 23 (L) 03/08/2016 1116   CHOLHDL 4.9 03/08/2016 1116   VLDL 46 (H) 03/08/2016 1116   LDLCALC 44 03/08/2016 1116   LDLDIRECT 103.1 05/22/2007 0922    Assessment & Plan:   1. Seasonal allergic rhinitis due to other allergic trigger Stable - cetirizine (ZYRTEC) 10 MG tablet; Take 1 tablet (10 mg total) by mouth daily.  Dispense: 30 tablet; Refill: 3 - fluticasone (FLONASE) 50 MCG/ACT nasal spray; PLACE 2 SPRAYS INTO BOTH NOSTRILS DAILY.  Dispense: 16 g; Refill: 3  2. CAD S/P LAD and RCA DES Risk factor modification Advised of the need to keep his appointment with cardiology - clopidogrel (PLAVIX) 75 MG tablet; Take 1 tablet (75 mg total) by mouth daily.  Dispense: 90 tablet; Refill: 1 - lisinopril (PRINIVIL,ZESTRIL) 2.5 MG tablet; Take 1 tablet (2.5 mg total) by mouth at bedtime.  Dispense: 30 tablet; Refill: 3 - metoprolol tartrate (LOPRESSOR) 25 MG tablet; Take 1 tablet (25  mg total) by mouth 2 (two) times daily.  Dispense: 60 tablet; Refill: 3 - atorvastatin (LIPITOR) 80 MG tablet; TAKE 1 TABLET BY MOUTH DAILY AT 6 PM.  Dispense: 30 tablet; Refill: 3 - CMP14+EGFR - Lipid panel  3. Depression with anxiety Uncontrolled We have provided the mobile crisis number to him Encouraged to follow-up with Psychiatry as current regimen is not helping - sertraline (ZOLOFT) 100 MG tablet; Take 1 tablet (100 mg total) by mouth daily.  Dispense: 30 tablet; Refill: 3 - buPROPion (WELLBUTRIN SR) 150 MG 12 hr tablet; Take 1 tablet (150 mg total) by mouth 2 (two) times daily.  Dispense: 60 tablet; Refill: 3 - hydrOXYzine (ATARAX/VISTARIL) 25 MG tablet; Take 1 tablet (25 mg total) by mouth 3 (three) times daily as needed.  Dispense: 60 tablet; Refill: 3  4. Lower urinary tract symptoms Controlled - tamsulosin (FLOMAX) 0.4 MG CAPS capsule; Take 1 capsule (0.4 mg total) by mouth daily.  Dispense: 30 capsule; Refill: 3  5. Muscle spasm Stable - cyclobenzaprine (FLEXERIL) 10 MG tablet; TAKE 1 TABLET BY MOUTH 2 TIMES DAILY AS NEEDED FOR MUSCLE SPASMS.  Dispense: 60 tablet; Refill: 1  6. Gastroesophageal reflux disease without esophagitis Stable - famotidine (PEPCID) 20 MG tablet; Take 1 tablet (20 mg total) by mouth 2 (two) times daily.  Dispense: 60 tablet; Refill: 3  7. Chronic pain of right knee Status post previous knee surgery Underlying osteoarthritis Caution with NSAIDs given history of CHF Patient advised to take Tylenol; could use Advil once in a while.  8. Class 1 obesity due to excess calories with serious comorbidity and body mass index (BMI) of 34.0 to 34.9 in adult Discussed reducing caloric intake and exercise with a goal of weight loss.   Meds ordered this encounter  Medications  . cetirizine (ZYRTEC) 10 MG tablet    Sig: Take 1 tablet (10 mg total) by mouth daily.    Dispense:  30 tablet    Refill:  3  . clopidogrel (PLAVIX) 75 MG tablet    Sig: Take 1  tablet (75 mg total) by mouth daily.  Dispense:  90 tablet    Refill:  1  . lisinopril (PRINIVIL,ZESTRIL) 2.5 MG tablet    Sig: Take 1 tablet (2.5 mg total) by mouth at bedtime.    Dispense:  30 tablet    Refill:  3  . metoprolol tartrate (LOPRESSOR) 25 MG tablet    Sig: Take 1 tablet (25 mg total) by mouth 2 (two) times daily.    Dispense:  60 tablet    Refill:  3  . sertraline (ZOLOFT) 100 MG tablet    Sig: Take 1 tablet (100 mg total) by mouth daily.    Dispense:  30 tablet    Refill:  3  . tamsulosin (FLOMAX) 0.4 MG CAPS capsule    Sig: Take 1 capsule (0.4 mg total) by mouth daily.    Dispense:  30 capsule    Refill:  3  . atorvastatin (LIPITOR) 80 MG tablet    Sig: TAKE 1 TABLET BY MOUTH DAILY AT 6 PM.    Dispense:  30 tablet    Refill:  3  . buPROPion (WELLBUTRIN SR) 150 MG 12 hr tablet    Sig: Take 1 tablet (150 mg total) by mouth 2 (two) times daily.    Dispense:  60 tablet    Refill:  3  . cyclobenzaprine (FLEXERIL) 10 MG tablet    Sig: TAKE 1 TABLET BY MOUTH 2 TIMES DAILY AS NEEDED FOR MUSCLE SPASMS.    Dispense:  60 tablet    Refill:  1  . famotidine (PEPCID) 20 MG tablet    Sig: Take 1 tablet (20 mg total) by mouth 2 (two) times daily.    Dispense:  60 tablet    Refill:  3  . fluticasone (FLONASE) 50 MCG/ACT nasal spray    Sig: PLACE 2 SPRAYS INTO BOTH NOSTRILS DAILY.    Dispense:  16 g    Refill:  3  . hydrOXYzine (ATARAX/VISTARIL) 25 MG tablet    Sig: Take 1 tablet (25 mg total) by mouth 3 (three) times daily as needed.    Dispense:  60 tablet    Refill:  3    Follow-up: Return in about 3 months (around 03/14/2017) for Follow-up on chronic medical conditions.   Arnoldo Morale MD

## 2016-12-13 ENCOUNTER — Telehealth: Payer: Self-pay | Admitting: *Deleted

## 2016-12-13 LAB — LIPID PANEL
CHOL/HDL RATIO: 4.2 ratio (ref 0.0–5.0)
Cholesterol, Total: 114 mg/dL (ref 100–199)
HDL: 27 mg/dL — AB (ref >39)
LDL Calculated: 40 mg/dL (ref 0–99)
Triglycerides: 237 mg/dL — ABNORMAL HIGH (ref 0–149)
VLDL CHOLESTEROL CAL: 47 mg/dL — AB (ref 5–40)

## 2016-12-13 LAB — CMP14+EGFR
A/G RATIO: 1.6 (ref 1.2–2.2)
ALBUMIN: 4.2 g/dL (ref 3.5–5.5)
ALT: 25 IU/L (ref 0–44)
AST: 16 IU/L (ref 0–40)
Alkaline Phosphatase: 118 IU/L — ABNORMAL HIGH (ref 39–117)
BILIRUBIN TOTAL: 0.3 mg/dL (ref 0.0–1.2)
BUN / CREAT RATIO: 13 (ref 9–20)
BUN: 14 mg/dL (ref 6–24)
CALCIUM: 9.1 mg/dL (ref 8.7–10.2)
CHLORIDE: 103 mmol/L (ref 96–106)
CO2: 23 mmol/L (ref 18–29)
Creatinine, Ser: 1.06 mg/dL (ref 0.76–1.27)
GFR, EST AFRICAN AMERICAN: 90 mL/min/{1.73_m2} (ref >59)
GFR, EST NON AFRICAN AMERICAN: 78 mL/min/{1.73_m2} (ref >59)
Globulin, Total: 2.6 g/dL (ref 1.5–4.5)
Glucose: 104 mg/dL — ABNORMAL HIGH (ref 65–99)
POTASSIUM: 4.4 mmol/L (ref 3.5–5.2)
Sodium: 141 mmol/L (ref 134–144)
TOTAL PROTEIN: 6.8 g/dL (ref 6.0–8.5)

## 2016-12-13 NOTE — Telephone Encounter (Signed)
Unable to contact pt for lab results  LVM to return call

## 2016-12-13 NOTE — Telephone Encounter (Signed)
Pt returned call - advsd of lab results; provider notations,  Pt stated he understood but didn't know how exercising due to his depression. Pt stated he would try.

## 2016-12-18 ENCOUNTER — Other Ambulatory Visit: Payer: Self-pay

## 2016-12-18 ENCOUNTER — Ambulatory Visit (HOSPITAL_COMMUNITY): Payer: Self-pay | Attending: Cardiovascular Disease

## 2016-12-18 DIAGNOSIS — E785 Hyperlipidemia, unspecified: Secondary | ICD-10-CM | POA: Insufficient documentation

## 2016-12-18 DIAGNOSIS — I513 Intracardiac thrombosis, not elsewhere classified: Secondary | ICD-10-CM | POA: Insufficient documentation

## 2016-12-18 DIAGNOSIS — Z6834 Body mass index (BMI) 34.0-34.9, adult: Secondary | ICD-10-CM | POA: Insufficient documentation

## 2016-12-18 DIAGNOSIS — R29898 Other symptoms and signs involving the musculoskeletal system: Secondary | ICD-10-CM | POA: Insufficient documentation

## 2016-12-18 DIAGNOSIS — E669 Obesity, unspecified: Secondary | ICD-10-CM | POA: Insufficient documentation

## 2016-12-18 DIAGNOSIS — I10 Essential (primary) hypertension: Secondary | ICD-10-CM | POA: Insufficient documentation

## 2016-12-18 MED ORDER — PERFLUTREN LIPID MICROSPHERE
1.0000 mL | INTRAVENOUS | Status: AC | PRN
  Administered 2016-12-18: 3 mL via INTRAVENOUS

## 2016-12-31 ENCOUNTER — Encounter: Payer: Self-pay | Admitting: Cardiovascular Disease

## 2016-12-31 ENCOUNTER — Ambulatory Visit (INDEPENDENT_AMBULATORY_CARE_PROVIDER_SITE_OTHER): Payer: Self-pay | Admitting: Cardiovascular Disease

## 2016-12-31 VITALS — BP 91/57 | HR 62 | Ht 68.0 in | Wt 227.0 lb

## 2016-12-31 DIAGNOSIS — I2510 Atherosclerotic heart disease of native coronary artery without angina pectoris: Secondary | ICD-10-CM

## 2016-12-31 DIAGNOSIS — I10 Essential (primary) hypertension: Secondary | ICD-10-CM

## 2016-12-31 DIAGNOSIS — I255 Ischemic cardiomyopathy: Secondary | ICD-10-CM

## 2016-12-31 DIAGNOSIS — E785 Hyperlipidemia, unspecified: Secondary | ICD-10-CM

## 2016-12-31 DIAGNOSIS — I2102 ST elevation (STEMI) myocardial infarction involving left anterior descending coronary artery: Secondary | ICD-10-CM

## 2016-12-31 DIAGNOSIS — Z9861 Coronary angioplasty status: Secondary | ICD-10-CM

## 2016-12-31 NOTE — Patient Instructions (Signed)
Your physician has recommended you make the following change in your medication:   1.) increase the fish oil to  2 capsules twice a day.   Your physician wants you to follow-up in: 6 months or sooner if needed. You will receive a reminder letter in the mail two months in advance. If you don't receive a letter, please call our office to schedule the follow-up appointment.  If you need a refill on your cardiac medications before your next appointment, please call your pharmacy.

## 2016-12-31 NOTE — Progress Notes (Signed)
Patient ID: Henry David, male   DOB: 11/19/1960, 56 y.o.   MRN: [id number]      PCP: Dr. Arnoldo Morale  HPI: Henry David is a 56 y.o. male who presents to the office today for a 5 month follow up cardiology evaluation.  [name] has a history of anxiety , depression and stress disorder.  He had not seen a physician in years in the past was told of having high cholesterol but never received treatment. On July 09, 2015 he developed new onset chest pain leading to a [facility] emergency room presentation.  His ECG several hours after presentation revealed anterolateral ST elevation and he was transported to [facility] for emergent catheterization and PCI. Catheterization was performed by me, which revealed mild acute LV dysfunction with mid anterolateral hypocontractility and an EF of 45-50%.  There was significant multivessel CAD with the LAD giving rise to a bifurcating diagonal vessel with tubular narrowing of 75-80% and after the takeoff of the proximal ulcerative perforating artery the LAD had a 95% napkin ring stenosis followed by 60% stenosis in its midsegment. The left circumflex coronary artery was felt to have an 80% eccentric narrowing in the first marginal branch. The RCA was a large dominant vessel with diffuse 80% proximal stenosis.  He underwent successful intervention with PTCA stenting of the mid LAD with insertion of a 2.7518 mm Xience Alpine stent postdilated to 2.97 mm in the 100% occlusion reduced to 0%, and PTCA of his diagonal stenosis being reduced to approximate 5%. 2 days later he underwent repeat catheterization which showed patent intervention sites.  His circumflex marginal stenosis had improved and now only appeared 30%. His RCA stenosis appeared 90% and he underwent successful stenting of the RCA  with insertion of a 4.018 mm Xience Alpine DES stent postdilated to 4.51 mm with the stenoses being reduced to 0% in the staged procedure.   When I saw him in  February 2017 he remained stable without recurrent chest pain development.  He was on  aspirin and Brilinta 90 mg twice a day for dual antiplatelet therapy.  He has been on metoprolol, tartrate 25 mg twice a day and low-dose lisinopril 2.5 mg in the morning for CAD.  He has had issues with low blood pressure and had been off lisinopril.  I suggested since his blood pressure was stable of rechallenge of low-dose therapy..  For his anxiety. He was on Wellbutrin 150 mg twice a day.  He was tolerating atorvastatin 80 mg for his hyperlipidemia.  He denied myalgias.    He underwent an echo study on 12/26/2015 which showed an EF of 40-45%.  There was akinesis of the anteroseptal and apical myocardium and he had grade 1 diastolic dysfunction.  Aortic root was mildly dilated.  There appeared to be an apical thrombus on definity evaluation.  He was hospitalized on 12/29/2015 with palpitations and increased anxiety.  During that evaluation, he was started on Coumadin in light of his apical thrombus. When I saw him in follow-up, since he was started on Coumadin, I discontinued Brilinta and started Plavix.    He underwent a follow-up echo Doppler study on 06/24/2016 with Definity contrast.  EF was 40-45%.  There was akinesis of the mid apical, anteroseptal and anterior wall.  He had grade 1 diastolic dysfunction.  He was no evidence for any thrombus on contrast evaluation.  There was trivial TR.  He also underwent a one-year follow-up nuclear perfusion study which showed an  EF of 41%.  There was evidence for his prior scar in the mid anterior, anteroseptal and apical segment.  There was no associated ischemia.  Review of his medications reveals that he has been taking triple drug therapy with aspirin, Plavix and warfarin.  When I last saw him, I suggested discontinuance of warfarin and that he continue on aspirin and Plavix indefinitely.  He underwent a follow-up echo Doppler study on 12/18/2016 which showed an EF of  40-45% with akinesis of the mid anterior apical myocardium.  There was grade 2 diastolic dysfunction.  He denies any significant chest pain.  At times there has been a rare chest tightness which is nonexertional.  He admits to mild shortness of breath with activity.  He presents for evaluation.  Past Medical History:  Diagnosis Date  . Anxiety   . Apical mural thrombus   . BPH (benign prostatic hyperplasia)   . Depression   . Diverticulitis   . Elevated cholesterol   . Essential hypertension   . Post traumatic stress disorder   . STEMI (ST elevation myocardial infarction) [facility])     Past Surgical History:  Procedure Laterality Date  . CARDIAC CATHETERIZATION N/A 07/09/2015   Procedure: Left Heart Cath and Coronary Angiography;  Surgeon: Lennette Bihari, MD;  Location: [facility] INVASIVE CV LAB;  Service: Cardiovascular;  Laterality: N/A;  . CARDIAC CATHETERIZATION N/A 07/09/2015   Procedure: Coronary Stent Intervention;  Surgeon: Lennette Bihari, MD;  Location: [facility] INVASIVE CV LAB;  Service: Cardiovascular;  Laterality: N/A;  . CARDIAC CATHETERIZATION N/A 07/11/2015   Procedure: Coronary Stent Intervention;  Surgeon: Lennette Bihari, MD;  Location: [facility] INVASIVE CV LAB;  Service: Cardiovascular;  Laterality: N/A;  . KNEE SURGERY      Allergies  Allergen Reactions  . Coconut Fatty Acids     unknown    Current Outpatient Prescriptions  Medication Sig Dispense Refill  . acetaminophen (TYLENOL) 500 MG tablet Take 500 mg by mouth every 6 (six) hours as needed.    Marland Kitchen aspirin 81 MG EC tablet Take 1 tablet (81 mg total) by mouth daily. 30 tablet 0  . atorvastatin (LIPITOR) 80 MG tablet TAKE 1 TABLET BY MOUTH DAILY AT 6 PM. 30 tablet 3  . buPROPion (WELLBUTRIN SR) 150 MG 12 hr tablet Take 1 tablet (150 mg total) by mouth 2 (two) times daily. 60 tablet 3  . cetirizine (ZYRTEC) 10 MG tablet Take 1 tablet (10 mg total) by mouth daily. 30 tablet 3  . clopidogrel (PLAVIX) 75 MG tablet Take 1 tablet (75 mg  total) by mouth daily. 90 tablet 1  . cyclobenzaprine (FLEXERIL) 10 MG tablet TAKE 1 TABLET BY MOUTH 2 TIMES DAILY AS NEEDED FOR MUSCLE SPASMS. 60 tablet 1  . famotidine (PEPCID) 20 MG tablet Take 1 tablet (20 mg total) by mouth 2 (two) times daily. 60 tablet 3  . fluticasone (FLONASE) 50 MCG/ACT nasal spray PLACE 2 SPRAYS INTO BOTH NOSTRILS DAILY. 16 g 3  . hydrOXYzine (ATARAX/VISTARIL) 25 MG tablet Take 1 tablet (25 mg total) by mouth 3 (three) times daily as needed. 60 tablet 3  . lisinopril (PRINIVIL,ZESTRIL) 2.5 MG tablet Take 1 tablet (2.5 mg total) by mouth at bedtime. 30 tablet 3  . metoprolol tartrate (LOPRESSOR) 25 MG tablet Take 1 tablet (25 mg total) by mouth 2 (two) times daily. 60 tablet 3  . NITROSTAT 0.4 MG SL tablet PLACE 1 TABLET UNDER THE TONGUE EVERY 5 MINUTES AS NEEDED FOR CHEST PAIN. MAX-3  DOSES WITHIN A 15 MINUTE INTERVAL 25 tablet 0  . olopatadine (PATANOL) 0.1 % ophthalmic solution PLACE 1 DROP INTO BOTH EYES 2 TIMES DAILY. 15 mL 0  . Omega-3 Fatty Acids (FISH OIL) 1000 MG CAPS Take 2 capsules by mouth 2 (two) times daily.    . sertraline (ZOLOFT) 100 MG tablet Take 1 tablet (100 mg total) by mouth daily. 30 tablet 3  . tamsulosin (FLOMAX) 0.4 MG CAPS capsule Take 1 capsule (0.4 mg total) by mouth daily. 30 capsule 3   No current facility-administered medications for this visit.     Social History   Social History  . Marital status: Single    Spouse name: N/A  . Number of children: N/A  . Years of education: N/A   Occupational History  . Not on file.   Social History Main Topics  . Smoking status: Former Smoker    Packs/day: 0.00    Types: Cigarettes    Quit date: 07/04/2015  . Smokeless tobacco: Former Systems developer     Comment: Nov. 27 2016  . Alcohol use 2.4 oz/week    4 Cans of beer per week     Comment: occassional use  . Drug use: No  . Sexual activity: Not Currently   Other Topics Concern  . Not on file   Social History Narrative  . No narrative on  file    Family History  Problem Relation Age of Onset  . Heart disease Mother   . Heart disease Father     ROS General: Negative; No fevers, chills, or night sweats HEENT: Negative; No changes in vision or hearing, sinus congestion, difficulty swallowing Pulmonary: Negative; No cough, wheezing, shortness of breath, hemoptysis Cardiovascular: See HPI: No chest pain, presyncope, syncope, palpatations GI: Negative; No nausea, vomiting, diarrhea, or abdominal pain GU: Negative; No dysuria, hematuria, or difficulty voiding Musculoskeletal: Negative; no myalgias, joint pain, or weakness Hematologic: Negative; no easy bruising, bleeding Endocrine: Negative; no heat/cold intolerance; no diabetes, Neuro: Negative; no changes in balance, headaches Skin: Negative; No rashes or skin lesions Psychiatric: Negative; No behavioral problems, depression Sleep: Negative; No snoring,  daytime sleepiness, hypersomnolence, bruxism, restless legs, hypnogognic hallucinations. Other comprehensive 14 point system review is negative   Physical Exam BP (!) 91/57 (BP Location: Right Arm, Patient Position: Sitting, Cuff Size: Large)   Pulse 62   Ht _0  (1.727 m)   Wt 227 lb (103 kg)   BMI 34.52 kg/m    AP blood pressure by me was 102/60  Wt Readings from Last 3 Encounters:  12/31/16 227 lb (103 kg)  12/12/16 228 lb (103.4 kg)  08/27/16 234 lb 12.8 oz (106.5 kg)   General: Alert, oriented, no distress.  Skin: normal turgor, no rashes, warm and dry HEENT: Normocephalic, atraumatic. Pupils equal round and reactive to light; sclera anicteric; extraocular muscles intact, No lid lag; Nose without nasal septal hypertrophy; Mouth/Parynx benign; Mallinpatti scale 3 Neck: No JVD, no carotid bruits; normal carotid upstroke Lungs: clear to ausculatation and percussion bilaterally; no wheezing or rales, normal inspiratory and expiratory effort Chest wall: without tenderness to palpitation Heart: PMI not  displaced, RRR, s1 s2 normal, 1/6 systolic murmur, No diastolic murmur, no rubs, gallops, thrills, or heaves Abdomen: soft, nontender; no hepatosplenomehaly, BS+; abdominal aorta nontender and not dilated by palpation. Back: no CVA tenderness Pulses: 2+  Musculoskeletal: full range of motion, normal strength, no joint deformities Extremities: Pulses 2+, no clubbing cyanosis or edema, Homan's sign negative  Neurologic: grossly nonfocal;  Cranial nerves grossly wnl Psychologic: Normal mood and affect  ECG (independently read by me): Normal sinus rhythm at 62 bpm.  Normal intervals.  No ST segment changes.  Poor R-wave progression.  07/24/2016 ECG (independently read by me): Normal sinus rhythm at 64 bpm.  PR interval 152 ms; QTc interval 418 ms.  QS V1 and V2.  September 2017 ECG (independently read by me): Normal sinus rhythm at 60 bpm.  Low voltage.  No ST segment changes.  Normal intervals.  February 2017 ECG (independently read by me):  Normal sinus rhythm at 70 bpm. Nonspecific T abnormality in lead 3. Poor progression V1 through V3.Marland Kitchen  Normal intervals.  LABS:  BMP Latest Ref Rng & Units 12/12/2016 03/08/2016 12/30/2015  Glucose 65 - 99 mg/dL 104(H) 110(H) 110(H)  BUN 6 - 24 mg/dL _0 Creatinine 0.76 - 1.27 mg/dL 1.06 1.05 0.97  BUN/Creat Ratio 9 - 20 13 - -  Sodium 134 - 144 mmol/L 141 138 137  Potassium 3.5 - 5.2 mmol/L 4.4 4.6 3.9  Chloride 96 - 106 mmol/L 103 105 105  CO2 18 - 29 mmol/L _1 Calcium 8.7 - 10.2 mg/dL 9.1 8.9 9.0     Hepatic Function Latest Ref Rng & Units 12/12/2016 03/08/2016 12/29/2015  Total Protein 6.0 - 8.5 g/dL 6.8 7.1 7.8  Albumin 3.5 - 5.5 g/dL 4.2 4.1 4.4  AST 0 - 40 IU/L _2 ALT 0 - 44 IU/L 25 23 33  Alk Phosphatase 39 - 117 IU/L 118(H) 95 87  Total Bilirubin 0.0 - 1.2 mg/dL 0.3 0.3 0.8  Bilirubin, Direct 0.1 - 0.5 mg/dL - - -    CBC Latest Ref Rng & Units 12/31/2015 12/30/2015 12/29/2015  WBC 4.0 - 10.5 K/uL 7.9 8.0 8.7  Hemoglobin  13.0 - 17.0 g/dL 13.3 13.7 13.7  Hematocrit 39.0 - 52.0 % 38.6(L) 38.3(L) 39.0  Platelets 150 - 400 K/uL 269 254 301   Lab Results  Component Value Date   MCV 83.4 12/31/2015   MCV 85.1 12/30/2015   MCV 83.0 12/29/2015    Lab Results  Component Value Date   TSH 2.27 05/22/2007    BNP    Component Value Date/Time   BNP 42.5 12/30/2015 0453    ProBNP No results found for: PROBNP   Lipid Panel     Component Value Date/Time   CHOL 114 12/12/2016 1118   TRIG 237 (H) 12/12/2016 1118   HDL 27 (L) 12/12/2016 1118   CHOLHDL 4.2 12/12/2016 1118   CHOLHDL 4.9 03/08/2016 1116   VLDL 46 (H) 03/08/2016 1116   LDLCALC 40 12/12/2016 1118   LDLDIRECT 103.1 05/22/2007 0922     RADIOLOGY: No results found.  IMPRESSION:  1. CAD S/P LAD and RCA DES   2. STEMI 07/09/15   3. Cardiomyopathy, ischemic   4. Essential hypertension   5. Hyperlipidemia with target LDL less than 70     ASSESSMENT AND PLAN: Mr Minerd is a 56 year old male who suffered and anterior ST segment elevation myocardial infarction on 07/09/2015 at which time he underwent successful acute intervention to totally occluded LAD and high-grade diagonal vessel as noted above.  He subsequently underwent staged intervention 2 days later to his RCA.  Clinically, he has felt well without recurrent anginal symptomatology.  He had been started on warfarin  secondary to echocardiographic demonstration of apical thrombus.  He has been on triple drug therapy.  His most recent nuclear study demonstrates scar  concordant with his infarct without associated ischemia.  A follow-up echo Doppler study in November 2017 no longer demonstrated any evidence for apical thrombus.  As result, recommended discontinuance of warfarin and he has been maintained on aspirin and Plavix.  He had a six-month follow-up echo Doppler study off warfarin to make certain he had not developed any recurrent apical thrombus.  His most recent echo Doppler study  from 12/18/2016 was reviewed and there was no evidence for recurrent apical thrombus.  He did have apical distal anterior wall motion abnormality.  Clinically, he is without anginal symptoms.  He admits to mild shortness of breath with activity.  His blood pressure tends to be low.  He continues to be on low-dose metoprolol at 25 mg twice a day in addition to lisinopril 2.5 mg.  His lipid studies have significantly improved on atorvastatin 80 mg and fish oil.  He continues to be on dual antiplatelet therapy with aspirin and Plavix.  He has a history of depression for which he is on Zoloft and Wellbutrin.  I will see him in 6 months for reevaluation. Time spent: 25 minutes  Troy Sine, MD, [facility]  01/02/2017 7:25 PM

## 2017-01-07 ENCOUNTER — Telehealth: Payer: Self-pay | Admitting: Family Medicine

## 2017-01-07 NOTE — Telephone Encounter (Signed)
Please advise on symptoms and appointment needs.

## 2017-01-07 NOTE — Telephone Encounter (Signed)
PT called to let you know that he feel that he has fluit in the lungs and chest congestive and sore throat, he would like to know what he can do and what you can recommend to do, please follow up with pt

## 2017-01-08 NOTE — Telephone Encounter (Signed)
He needs an office visit and if symptoms are disabling he will need to go to the ER or urgent care.

## 2017-01-09 NOTE — Telephone Encounter (Signed)
Please schedule the patient an OV with Amao and advise to utilize the ER if symptoms are disabling

## 2017-01-14 ENCOUNTER — Other Ambulatory Visit: Payer: Self-pay | Admitting: Family Medicine

## 2017-01-14 DIAGNOSIS — Z9861 Coronary angioplasty status: Principal | ICD-10-CM

## 2017-01-14 DIAGNOSIS — R0982 Postnasal drip: Secondary | ICD-10-CM

## 2017-01-14 DIAGNOSIS — F418 Other specified anxiety disorders: Secondary | ICD-10-CM

## 2017-01-14 DIAGNOSIS — H1013 Acute atopic conjunctivitis, bilateral: Secondary | ICD-10-CM

## 2017-01-14 DIAGNOSIS — M62838 Other muscle spasm: Secondary | ICD-10-CM

## 2017-01-14 DIAGNOSIS — I2510 Atherosclerotic heart disease of native coronary artery without angina pectoris: Secondary | ICD-10-CM

## 2017-01-14 MED FILL — FLUTICASONE PROP 50 MCG SPR: 50 | 30 days supply | Qty: 16 | Fill #3

## 2017-01-14 MED FILL — ?METOPROLOL 25 MG TABLET: 25 | 30 days supply | Qty: 60 | Fill #3

## 2017-01-14 MED FILL — ?TAMSULOSIN HCL 0.4 MG CAP: 0.4 | 30 days supply | Qty: 30 | Fill #0

## 2017-01-14 MED FILL — ?HYDROXYZINE HCL 25 MG TAB: 25 MG | 30 days supply | Qty: 60 | Fill #0

## 2017-01-14 MED FILL — ?CLOPIDOGREL 75 MG TABLET: 75 | 30 days supply | Qty: 30 | Fill #9

## 2017-01-14 MED FILL — ATORVASTATIN 80 MG TABLET: 80 | 30 days supply | Qty: 30 | Fill #2

## 2017-01-14 MED FILL — FAMOTIDINE 20 MG TABLET: 20 | 30 days supply | Qty: 60 | Fill #1

## 2017-01-14 MED FILL — ?SERTRALINE HCL 50 MG TABLE: 50 | 30 days supply | Qty: 30 | Fill #3

## 2017-01-14 MED FILL — ?CETIRIZINE HCL 10 MG TABLE: 10 | 30 days supply | Qty: 30 | Fill #2

## 2017-01-14 MED FILL — BUPROPION SR 150 MG TABLET: 150 | 30 days supply | Qty: 60 | Fill #2

## 2017-01-14 MED FILL — LISINOPRIL 2.5 MG TABLET: 2.5 | 30 days supply | Qty: 30 | Fill #1

## 2017-01-14 MED FILL — ?CYCLOBENZAPRINE 10 MG TABL: 10 | 30 days supply | Qty: 60 | Fill #1

## 2017-01-16 ENCOUNTER — Other Ambulatory Visit: Payer: Self-pay | Admitting: Family Medicine

## 2017-01-16 DIAGNOSIS — H1013 Acute atopic conjunctivitis, bilateral: Secondary | ICD-10-CM

## 2017-02-06 ENCOUNTER — Other Ambulatory Visit: Payer: Self-pay | Admitting: Family Medicine

## 2017-02-06 DIAGNOSIS — K219 Gastro-esophageal reflux disease without esophagitis: Secondary | ICD-10-CM

## 2017-02-06 DIAGNOSIS — M62838 Other muscle spasm: Secondary | ICD-10-CM

## 2017-02-06 DIAGNOSIS — I2510 Atherosclerotic heart disease of native coronary artery without angina pectoris: Secondary | ICD-10-CM

## 2017-02-06 DIAGNOSIS — Z9861 Coronary angioplasty status: Principal | ICD-10-CM

## 2017-02-06 DIAGNOSIS — R0982 Postnasal drip: Secondary | ICD-10-CM

## 2017-02-06 DIAGNOSIS — R0781 Pleurodynia: Secondary | ICD-10-CM

## 2017-02-06 MED FILL — FAMOTIDINE 20 MG TABLET: 20 | 30 days supply | Qty: 60 | Fill #2

## 2017-02-06 MED FILL — ATORVASTATIN 80 MG TABLET: 80 | 30 days supply | Qty: 30 | Fill #3

## 2017-02-06 MED FILL — ?BUPROPION HCL SR 150 MG TA: 150 | 30 days supply | Qty: 60 | Fill #3

## 2017-02-06 MED FILL — OLOPATADINE HCL 0.1% EYE DR: 0.1 | 18 days supply | Qty: 15 | Fill #0

## 2017-02-06 MED FILL — NITROSTAT 0.4 MG TABLET SL: 0.4 | 25 days supply | Qty: 25 | Fill #0

## 2017-02-10 MED FILL — ?CLOPIDOGREL 75 MG TABLET: 75 | 30 days supply | Qty: 30 | Fill #10

## 2017-02-10 MED FILL — ?CETIRIZINE HCL 10 MG TABLE: 10 | 30 days supply | Qty: 30 | Fill #3

## 2017-02-10 MED FILL — hydrOXYzine HCL 25 MG TABS: 25 | 30 days supply | Qty: 60 | Fill #1

## 2017-02-14 ENCOUNTER — Other Ambulatory Visit: Payer: Self-pay | Admitting: Family Medicine

## 2017-02-14 DIAGNOSIS — F418 Other specified anxiety disorders: Secondary | ICD-10-CM

## 2017-02-14 DIAGNOSIS — M62838 Other muscle spasm: Secondary | ICD-10-CM

## 2017-02-14 MED FILL — FLUTICASONE PROP 50 MCG SPR: 50 | 30 days supply | Qty: 16 | Fill #0

## 2017-02-14 MED FILL — ?TAMSULOSIN HCL 0.4 MG CAP: 0.4 | 30 days supply | Qty: 30 | Fill #1

## 2017-02-14 MED FILL — ?CYCLOBENZAPRINE 10 MG TABL: 10 | 30 days supply | Qty: 60 | Fill #0

## 2017-02-14 MED FILL — METOPROLOL TARTRATE 25 MG T: 25 | 30 days supply | Qty: 60 | Fill #0

## 2017-02-14 MED FILL — SERTRALINE HCL 50 MG TAB: 50 | 30 days supply | Qty: 30 | Fill #0

## 2017-02-14 MED FILL — LISINOPRIL 2.5 MG TABLET: 2.5 | 30 days supply | Qty: 30 | Fill #2

## 2017-03-11 ENCOUNTER — Other Ambulatory Visit: Payer: Self-pay | Admitting: Family Medicine

## 2017-03-11 DIAGNOSIS — R0982 Postnasal drip: Secondary | ICD-10-CM

## 2017-03-11 DIAGNOSIS — I2510 Atherosclerotic heart disease of native coronary artery without angina pectoris: Secondary | ICD-10-CM

## 2017-03-11 DIAGNOSIS — Z9861 Coronary angioplasty status: Principal | ICD-10-CM

## 2017-03-11 MED FILL — ?CLOPIDOGREL 75MG TAB: 75 | 30 days supply | Qty: 30 | Fill #0

## 2017-03-11 MED FILL — hydrOXYzine HCL 25 MG TABS: 25 | 30 days supply | Qty: 60 | Fill #2

## 2017-03-11 MED FILL — ?CETIRIZINE HCL 10 MG TABLE: 10 | 30 days supply | Qty: 30 | Fill #0

## 2017-03-14 ENCOUNTER — Other Ambulatory Visit: Payer: Self-pay | Admitting: Family Medicine

## 2017-03-14 DIAGNOSIS — I2510 Atherosclerotic heart disease of native coronary artery without angina pectoris: Secondary | ICD-10-CM

## 2017-03-14 DIAGNOSIS — Z9861 Coronary angioplasty status: Secondary | ICD-10-CM

## 2017-03-14 DIAGNOSIS — H1013 Acute atopic conjunctivitis, bilateral: Secondary | ICD-10-CM

## 2017-03-14 DIAGNOSIS — M62838 Other muscle spasm: Secondary | ICD-10-CM

## 2017-03-14 MED FILL — ?CYCLOBENZAPRINE 10 MG TABL: 10 | 30 days supply | Qty: 60 | Fill #1

## 2017-03-14 MED FILL — FAMOTIDINE 20 MG TABLET: 20 | 30 days supply | Qty: 60 | Fill #0

## 2017-03-14 MED FILL — LISINOPRIL 2.5 MG TABLET: 2.5 | 30 days supply | Qty: 30 | Fill #3

## 2017-03-14 MED FILL — SERTRALINE HCL 50 MG TABLET: 50 | 30 days supply | Qty: 30 | Fill #1

## 2017-03-14 MED FILL — TAMSULOSIN HCL 0.4 MG CAP: 0.4 | 30 days supply | Qty: 30 | Fill #2

## 2017-03-14 MED FILL — FLUTICASONE PROP 50 MCG SPR: 50 | 30 days supply | Qty: 16 | Fill #1

## 2017-03-17 MED FILL — NITROSTAT 0.4 MG TABLET SL: 0.4 | 25 days supply | Qty: 25 | Fill #0

## 2017-03-17 MED FILL — METOPROLOL TARTRATE 25 MG T: 25 | 30 days supply | Qty: 60 | Fill #0

## 2017-03-17 MED FILL — BUPROPION SR 150 MG TABLET: 150 | 30 days supply | Qty: 60 | Fill #0

## 2017-03-17 MED FILL — OLOPATADINE HCL 0.1 % SOLN: 0.1 | 18 days supply | Qty: 15 | Fill #0

## 2017-03-19 ENCOUNTER — Ambulatory Visit: Payer: Self-pay | Attending: Family Medicine | Admitting: Family Medicine

## 2017-03-19 VITALS — BP 122/76 | HR 67 | Temp 97.5°F | Ht 68.0 in | Wt 225.0 lb

## 2017-03-19 DIAGNOSIS — M62838 Other muscle spasm: Secondary | ICD-10-CM

## 2017-03-19 DIAGNOSIS — Z7902 Long term (current) use of antithrombotics/antiplatelets: Secondary | ICD-10-CM | POA: Insufficient documentation

## 2017-03-19 DIAGNOSIS — Z91018 Allergy to other foods: Secondary | ICD-10-CM | POA: Insufficient documentation

## 2017-03-19 DIAGNOSIS — M62830 Muscle spasm of back: Secondary | ICD-10-CM | POA: Insufficient documentation

## 2017-03-19 DIAGNOSIS — I110 Hypertensive heart disease with heart failure: Secondary | ICD-10-CM | POA: Insufficient documentation

## 2017-03-19 DIAGNOSIS — Z9889 Other specified postprocedural states: Secondary | ICD-10-CM | POA: Insufficient documentation

## 2017-03-19 DIAGNOSIS — R399 Unspecified symptoms and signs involving the genitourinary system: Secondary | ICD-10-CM | POA: Insufficient documentation

## 2017-03-19 DIAGNOSIS — J3089 Other allergic rhinitis: Secondary | ICD-10-CM

## 2017-03-19 DIAGNOSIS — K219 Gastro-esophageal reflux disease without esophagitis: Secondary | ICD-10-CM | POA: Insufficient documentation

## 2017-03-19 DIAGNOSIS — I5032 Chronic diastolic (congestive) heart failure: Secondary | ICD-10-CM | POA: Insufficient documentation

## 2017-03-19 DIAGNOSIS — Z955 Presence of coronary angioplasty implant and graft: Secondary | ICD-10-CM | POA: Insufficient documentation

## 2017-03-19 DIAGNOSIS — R45851 Suicidal ideations: Secondary | ICD-10-CM | POA: Insufficient documentation

## 2017-03-19 DIAGNOSIS — F4310 Post-traumatic stress disorder, unspecified: Secondary | ICD-10-CM | POA: Insufficient documentation

## 2017-03-19 DIAGNOSIS — N400 Enlarged prostate without lower urinary tract symptoms: Secondary | ICD-10-CM | POA: Insufficient documentation

## 2017-03-19 DIAGNOSIS — I2510 Atherosclerotic heart disease of native coronary artery without angina pectoris: Secondary | ICD-10-CM | POA: Insufficient documentation

## 2017-03-19 DIAGNOSIS — E7800 Pure hypercholesterolemia, unspecified: Secondary | ICD-10-CM | POA: Insufficient documentation

## 2017-03-19 DIAGNOSIS — I252 Old myocardial infarction: Secondary | ICD-10-CM | POA: Insufficient documentation

## 2017-03-19 DIAGNOSIS — J302 Other seasonal allergic rhinitis: Secondary | ICD-10-CM | POA: Insufficient documentation

## 2017-03-19 DIAGNOSIS — F418 Other specified anxiety disorders: Secondary | ICD-10-CM | POA: Insufficient documentation

## 2017-03-19 DIAGNOSIS — Z9861 Coronary angioplasty status: Secondary | ICD-10-CM

## 2017-03-19 MED FILL — ATORVASTATIN 80 MG TABLET: 80 | 30 days supply | Qty: 30 | Fill #0

## 2017-03-20 ENCOUNTER — Encounter: Payer: Self-pay | Admitting: Family Medicine

## 2017-03-20 MED ORDER — LISINOPRIL 2.5 MG PO TABS
2.5000 mg | ORAL_TABLET | Freq: Every day | ORAL | 3 refills | Status: DC
Start: 2017-03-20 — End: 2017-04-15

## 2017-03-20 MED ORDER — FAMOTIDINE 20 MG PO TABS: 20.0000 mg | ORAL_TABLET | Freq: Two times a day (BID) | ORAL | 3 refills | Status: DC

## 2017-03-20 MED ORDER — FLUTICASONE PROPIONATE 50 MCG/ACT NA SUSP: NASAL | 3 refills | Status: DC

## 2017-03-20 MED ORDER — SERTRALINE HCL 100 MG PO TABS: 100.0000 mg | ORAL_TABLET | Freq: Every day | ORAL | 3 refills | Status: DC

## 2017-03-20 MED ORDER — HYDROXYZINE HCL 25 MG PO TABS: 25.0000 mg | ORAL_TABLET | Freq: Three times a day (TID) | ORAL | 3 refills | Status: DC | PRN

## 2017-03-20 MED ORDER — ATORVASTATIN CALCIUM 80 MG PO TABS: ORAL_TABLET | ORAL | 3 refills | Status: DC

## 2017-03-20 MED ORDER — TAMSULOSIN HCL 0.4 MG PO CAPS: 0.4000 mg | ORAL_CAPSULE | Freq: Every day | ORAL | 3 refills | Status: DC

## 2017-03-20 NOTE — Progress Notes (Signed)
Subjective:    Patient ID: Henry David, male    DOB: May 28, 1961, 56 y.o.   MRN: [id number]  HPI He is a 56 year old male with a history of GERD, coronary artery disease status post PCI with DES to LAD and RCA in 06/2015 (currently on Plavix), CHF (2-D echo reveals EF of 64-33%, grade 2 diastolic dysfunction from 2-D echo of 12/2016), depression and anxiety, left apical mural thrombus diagnosed in 12/2015 (completed course of anticoagulation with Coumadin in 07/2016).  He rarely  gets out of the house but states when it is raining he can go out as "he can't see forever". He also hates getting stuck in traffic as this worsens his depression and anxiety. He remains on Wellbutrin, hydroxyzine and Zoloft and had presented to [facility] for an initial assessment but never went back to see the psychiatrist..  Zoloft was increased at his last office visit but he continues to take 50 mg rather than the 100 mg he was prescribed and he has had previous counseling sessions with the LCSW in house. He informs me he has had suicidal ideations for the last 10 years but has no suicidal intent.   Last visit to cardiology was 12/2016 and recent 2-D echo revealed no change in ejection fraction. Denies chest pains and is short of breath when he bends over to tie his shoelaces.  He complains of upper back pain which started today and radiates down his right arm; "it feels like something is pinched". He has run out of his Flexeril which he was prescribed a muscle spasms.  He does not exercise, is not compliant with prescribed diet.   Past Medical History:  Diagnosis Date  . Anxiety   . Apical mural thrombus   . BPH (benign prostatic hyperplasia)   . Depression   . Diverticulitis   . Elevated cholesterol   . Essential hypertension   . Post traumatic stress disorder   . STEMI (ST elevation myocardial infarction) [facility])     Past Surgical History:  Procedure Laterality Date  . CARDIAC CATHETERIZATION N/A  07/09/2015   Procedure: Left Heart Cath and Coronary Angiography;  Surgeon: Troy Sine, MD;  Location: [place] CV LAB;  Service: Cardiovascular;  Laterality: N/A;  . CARDIAC CATHETERIZATION N/A 07/09/2015   Procedure: Coronary Stent Intervention;  Surgeon: Troy Sine, MD;  Location: [place] CV LAB;  Service: Cardiovascular;  Laterality: N/A;  . CARDIAC CATHETERIZATION N/A 07/11/2015   Procedure: Coronary Stent Intervention;  Surgeon: Troy Sine, MD;  Location: [place] CV LAB;  Service: Cardiovascular;  Laterality: N/A;  . KNEE SURGERY      Allergies  Allergen Reactions  . Coconut Fatty Acids     unknown     Review of Systems Constitutional: Negative for activity change and appetite change.  HENT: Negative for sinus pressure and sore throat.   Eyes: Negative for visual disturbance.  positive for itchy eyes Respiratory: Negative for cough, chest tightness and shortness of breath.   Cardiovascular: Positive for chest pain and negative for leg swelling.  Gastrointestinal: Negative for abdominal pain, diarrhea, constipation and abdominal distention.  Endocrine: Negative.   Genitourinary: Negative for dysuria.  Musculoskeletal: see hpi  Skin: Negative for rash.  Allergic/Immunologic: Negative.   Neurological: Negative for weakness, light-headedness and numbness.  Psychiatric/Behavioral: Positive for dysphoric mood. Negative for suicidal ideas.     Objective: Vitals:   03/20/17 0913  BP: 122/76  Pulse: 67  Temp: (!) 97.5 F (36.4 C)  TempSrc: Oral  SpO2: 96%  Weight: 225 lb (102.1 kg)  Height: '5\' 8"'$  (1.727 m)      Physical Exam  Constitutional: He is oriented to person, place, and time. He appears well-developed and well-nourished.  Cardiovascular: Normal rate, normal heart sounds and intact distal pulses.   No murmur heard. Pulmonary/Chest: Effort normal and breath sounds normal. He has no wheezes. He has no rales. He exhibits no tenderness.    Abdominal: Soft. Bowel sounds are normal. He exhibits no distension and no mass. There is no tenderness.  Musculoskeletal: Tenderness on palpation of the right trapezius muscle and on range of motion of right arm  Neurological: He is alert and oriented to person, place, and time.  Skin: Skin is warm and dry.  Psychiatric: He has a depressed mood       Assessment & Plan:  1. Seasonal allergic rhinitis due to other allergic trigger Stable - cetirizine (ZYRTEC) 10 MG tablet; Take 1 tablet (10 mg total) by mouth daily.  Dispense: 30 tablet; Refill: 3 - fluticasone (FLONASE) 50 MCG/ACT nasal spray; PLACE 2 SPRAYS INTO BOTH NOSTRILS DAILY.  Dispense: 16 g; Refill: 3  2. CAD S/P LAD and RCA DES No angina Lastly, with cardiology on 12/2016 Risk factor modification - clopidogrel (PLAVIX) 75 MG tablet; Take 1 tablet (75 mg total) by mouth daily.  Dispense: 90 tablet; Refill: 1 - lisinopril (PRINIVIL,ZESTRIL) 2.5 MG tablet; Take 1 tablet (2.5 mg total) by mouth at bedtime.  Dispense: 30 tablet; Refill: 3 - metoprolol tartrate (LOPRESSOR) 25 MG tablet; Take 1 tablet (25 mg total) by mouth 2 (two) times daily.  Dispense: 60 tablet; Refill: 3 - atorvastatin (LIPITOR) 80 MG tablet; TAKE 1 TABLET BY MOUTH DAILY AT 6 PM.  Dispense: 30 tablet; Refill: 3 - CMP14+EGFR - Lipid panel  3. Depression with anxiety Uncontrolled We have provided the mobile crisis number to him Encouraged to follow-up with Psychiatry as current regimen is not helping - still needs motivation to do so Advised that he should be taking 100 mg of Zoloft not '50mg'$  - sertraline (ZOLOFT) 100 MG tablet; Take 1 tablet (100 mg total) by mouth daily.  Dispense: 30 tablet; Refill: 3 - buPROPion (WELLBUTRIN SR) 150 MG 12 hr tablet; Take 1 tablet (150 mg total) by mouth 2 (two) times daily.  Dispense: 60 tablet; Refill: 3 - hydrOXYzine (ATARAX/VISTARIL) 25 MG tablet; Take 1 tablet (25 mg total) by mouth 3 (three) times daily as needed.   Dispense: 60 tablet; Refill: 3  4. Lower urinary tract symptoms Controlled - tamsulosin (FLOMAX) 0.4 MG CAPS capsule; Take 1 capsule (0.4 mg total) by mouth daily.  Dispense: 30 capsule; Refill: 3  5. Muscle spasm He has upper back pain at this time Has run out of Flexeril which I have advised him to pick up from the pharmacy. - cyclobenzaprine (FLEXERIL) 10 MG tablet; TAKE 1 TABLET BY MOUTH 2 TIMES DAILY AS NEEDED FOR MUSCLE SPASMS.  Dispense: 60 tablet; Refill: 1  6. Gastroesophageal reflux disease without esophagitis Stable - famotidine (PEPCID) 20 MG tablet; Take 1 tablet (20 mg total) by mouth 2 (two) times daily.  Dispense: 60 tablet; Refill: 3  This note has been created with Surveyor, quantity. Any transcriptional errors are unintentional.

## 2017-03-24 ENCOUNTER — Telehealth: Payer: Self-pay | Admitting: Family Medicine

## 2017-03-24 NOTE — Telephone Encounter (Signed)
Pt. Called requesting to speak with his PCP regarding his back. Pt. States that he was prescribed medication for muscle spasms but it is not helping much. Please f/u with pt.

## 2017-03-24 NOTE — Telephone Encounter (Signed)
Pt was called and informed to continue taking medication and to also try a heating pad for back pain as well.

## 2017-03-26 MED FILL — SERTRALINE HCL 100 MG TAB: 100 | 30 days supply | Qty: 30 | Fill #0

## 2017-04-03 ENCOUNTER — Ambulatory Visit: Payer: Self-pay | Attending: Family Medicine

## 2017-04-15 ENCOUNTER — Other Ambulatory Visit: Payer: Self-pay | Admitting: Family Medicine

## 2017-04-15 DIAGNOSIS — H1013 Acute atopic conjunctivitis, bilateral: Secondary | ICD-10-CM

## 2017-04-15 DIAGNOSIS — M62838 Other muscle spasm: Secondary | ICD-10-CM

## 2017-04-15 DIAGNOSIS — I2510 Atherosclerotic heart disease of native coronary artery without angina pectoris: Secondary | ICD-10-CM

## 2017-04-15 DIAGNOSIS — R399 Unspecified symptoms and signs involving the genitourinary system: Secondary | ICD-10-CM

## 2017-04-15 DIAGNOSIS — Z9861 Coronary angioplasty status: Secondary | ICD-10-CM

## 2017-04-15 MED FILL — FLUTICASONE PROP 50 MCG SPR: 50 | 30 days supply | Qty: 16 | Fill #2

## 2017-04-15 MED FILL — ATORVASTATIN 80 MG TABLET: 80 | 30 days supply | Qty: 30 | Fill #1

## 2017-04-15 MED FILL — SERTRALINE HCL 100 MG TAB: 100 | 30 days supply | Qty: 30 | Fill #1

## 2017-04-15 MED FILL — ?METOPROLOL 25 MG TABLET: 25 | 30 days supply | Qty: 60 | Fill #1

## 2017-04-15 MED FILL — LISINOPRIL 2.5 MG TABLET: 2.5 | 30 days supply | Qty: 30 | Fill #0

## 2017-04-15 MED FILL — NITROSTAT 0.4 MG TABLET SL: 0.4 | 25 days supply | Qty: 25 | Fill #0

## 2017-04-15 MED FILL — BUPROPION SR 150 MG TABLET: 150 | 30 days supply | Qty: 60 | Fill #1

## 2017-04-15 MED FILL — FAMOTIDINE 20 MG TABLET: 20 | 30 days supply | Qty: 60 | Fill #1

## 2017-04-15 MED FILL — OLOPATADINE HCL 0.1 % SOLN: 0.1 | 25 days supply | Qty: 5 | Fill #0

## 2017-04-15 MED FILL — ?TAMSULOSIN HCL 0.4 MG CAP: 0.4 | 30 days supply | Qty: 30 | Fill #3

## 2017-04-16 MED FILL — ?CYCLOBENZAPRINE 10 MG TABL: 10 | 30 days supply | Qty: 60 | Fill #0

## 2017-04-23 MED FILL — ?CETIRIZINE HCL 10 MG TABLE: 10 | 21 days supply | Qty: 21 | Fill #1

## 2017-04-23 MED FILL — CLOPIDOGREL 75 MG TABLET: 75 | 21 days supply | Qty: 21 | Fill #1

## 2017-04-24 ENCOUNTER — Other Ambulatory Visit: Payer: Self-pay

## 2017-04-24 DIAGNOSIS — F418 Other specified anxiety disorders: Secondary | ICD-10-CM

## 2017-04-24 MED ORDER — HYDROXYZINE HCL 25 MG PO TABS: 25.0000 mg | ORAL_TABLET | Freq: Three times a day (TID) | ORAL | 3 refills | Status: DC | PRN

## 2017-04-24 MED FILL — hydrOXYzine HCL 25 MG TABS: 25 | 20 days supply | Qty: 60 | Fill #0

## 2017-05-09 ENCOUNTER — Ambulatory Visit: Payer: Self-pay | Admitting: Family Medicine

## 2017-05-13 ENCOUNTER — Other Ambulatory Visit: Payer: Self-pay | Admitting: Family Medicine

## 2017-05-13 DIAGNOSIS — R399 Unspecified symptoms and signs involving the genitourinary system: Secondary | ICD-10-CM

## 2017-05-16 ENCOUNTER — Emergency Department (HOSPITAL_COMMUNITY): Payer: Medicaid Other

## 2017-05-16 ENCOUNTER — Inpatient Hospital Stay (HOSPITAL_COMMUNITY): Payer: Medicaid Other

## 2017-05-16 ENCOUNTER — Inpatient Hospital Stay (HOSPITAL_COMMUNITY)
Admission: EM | Admit: 2017-05-16 | Discharge: 2017-06-03 | DRG: 917 | Disposition: A | Discharge status: Skilled Nursing Facility | Payer: Medicaid Other | Attending: Internal Medicine | Admitting: Internal Medicine

## 2017-05-16 DIAGNOSIS — J9690 Respiratory failure, unspecified, unspecified whether with hypoxia or hypercapnia: Secondary | ICD-10-CM

## 2017-05-16 DIAGNOSIS — E871 Hypo-osmolality and hyponatremia: Secondary | ICD-10-CM | POA: Diagnosis not present

## 2017-05-16 DIAGNOSIS — D62 Acute posthemorrhagic anemia: Secondary | ICD-10-CM | POA: Diagnosis not present

## 2017-05-16 DIAGNOSIS — Z9911 Dependence on respirator [ventilator] status: Secondary | ICD-10-CM

## 2017-05-16 DIAGNOSIS — I69391 Dysphagia following cerebral infarction: Secondary | ICD-10-CM

## 2017-05-16 DIAGNOSIS — R1312 Dysphagia, oropharyngeal phase: Secondary | ICD-10-CM | POA: Diagnosis not present

## 2017-05-16 DIAGNOSIS — Z91018 Allergy to other foods: Secondary | ICD-10-CM

## 2017-05-16 DIAGNOSIS — R509 Fever, unspecified: Secondary | ICD-10-CM

## 2017-05-16 DIAGNOSIS — Z452 Encounter for adjustment and management of vascular access device: Secondary | ICD-10-CM

## 2017-05-16 DIAGNOSIS — R29727 NIHSS score 27: Secondary | ICD-10-CM | POA: Diagnosis present

## 2017-05-16 DIAGNOSIS — T405X1A Poisoning by cocaine, accidental (unintentional), initial encounter: Secondary | ICD-10-CM | POA: Diagnosis present

## 2017-05-16 DIAGNOSIS — R402132 Coma scale, eyes open, to sound, at arrival to emergency department: Secondary | ICD-10-CM | POA: Diagnosis present

## 2017-05-16 DIAGNOSIS — I110 Hypertensive heart disease with heart failure: Secondary | ICD-10-CM | POA: Diagnosis present

## 2017-05-16 DIAGNOSIS — E7800 Pure hypercholesterolemia, unspecified: Secondary | ICD-10-CM | POA: Diagnosis present

## 2017-05-16 DIAGNOSIS — N390 Urinary tract infection, site not specified: Secondary | ICD-10-CM | POA: Diagnosis not present

## 2017-05-16 DIAGNOSIS — I639 Cerebral infarction, unspecified: Secondary | ICD-10-CM

## 2017-05-16 DIAGNOSIS — I252 Old myocardial infarction: Secondary | ICD-10-CM

## 2017-05-16 DIAGNOSIS — E878 Other disorders of electrolyte and fluid balance, not elsewhere classified: Secondary | ICD-10-CM | POA: Diagnosis not present

## 2017-05-16 DIAGNOSIS — I427 Cardiomyopathy due to drug and external agent: Secondary | ICD-10-CM | POA: Diagnosis present

## 2017-05-16 DIAGNOSIS — I63512 Cerebral infarction due to unspecified occlusion or stenosis of left middle cerebral artery: Secondary | ICD-10-CM | POA: Diagnosis present

## 2017-05-16 DIAGNOSIS — Z87891 Personal history of nicotine dependence: Secondary | ICD-10-CM

## 2017-05-16 DIAGNOSIS — G8191 Hemiplegia, unspecified affecting right dominant side: Secondary | ICD-10-CM | POA: Diagnosis present

## 2017-05-16 DIAGNOSIS — R402212 Coma scale, best verbal response, none, at arrival to emergency department: Secondary | ICD-10-CM | POA: Diagnosis present

## 2017-05-16 DIAGNOSIS — Z781 Physical restraint status: Secondary | ICD-10-CM

## 2017-05-16 DIAGNOSIS — E870 Hyperosmolality and hypernatremia: Secondary | ICD-10-CM | POA: Diagnosis not present

## 2017-05-16 DIAGNOSIS — Z4659 Encounter for fitting and adjustment of other gastrointestinal appliance and device: Secondary | ICD-10-CM

## 2017-05-16 DIAGNOSIS — Z7902 Long term (current) use of antithrombotics/antiplatelets: Secondary | ICD-10-CM

## 2017-05-16 DIAGNOSIS — R4701 Aphasia: Secondary | ICD-10-CM | POA: Diagnosis present

## 2017-05-16 DIAGNOSIS — R402342 Coma scale, best motor response, flexion withdrawal, at arrival to emergency department: Secondary | ICD-10-CM | POA: Diagnosis present

## 2017-05-16 DIAGNOSIS — Z8249 Family history of ischemic heart disease and other diseases of the circulatory system: Secondary | ICD-10-CM

## 2017-05-16 DIAGNOSIS — I63232 Cerebral infarction due to unspecified occlusion or stenosis of left carotid arteries: Secondary | ICD-10-CM | POA: Diagnosis present

## 2017-05-16 DIAGNOSIS — I5022 Chronic systolic (congestive) heart failure: Secondary | ICD-10-CM | POA: Diagnosis present

## 2017-05-16 DIAGNOSIS — F1410 Cocaine abuse, uncomplicated: Secondary | ICD-10-CM | POA: Diagnosis present

## 2017-05-16 DIAGNOSIS — K219 Gastro-esophageal reflux disease without esophagitis: Secondary | ICD-10-CM | POA: Diagnosis present

## 2017-05-16 DIAGNOSIS — R4182 Altered mental status, unspecified: Secondary | ICD-10-CM

## 2017-05-16 DIAGNOSIS — F1010 Alcohol abuse, uncomplicated: Secondary | ICD-10-CM | POA: Diagnosis present

## 2017-05-16 DIAGNOSIS — J9600 Acute respiratory failure, unspecified whether with hypoxia or hypercapnia: Secondary | ICD-10-CM | POA: Diagnosis present

## 2017-05-16 DIAGNOSIS — F4310 Post-traumatic stress disorder, unspecified: Secondary | ICD-10-CM | POA: Diagnosis present

## 2017-05-16 DIAGNOSIS — G936 Cerebral edema: Secondary | ICD-10-CM

## 2017-05-16 DIAGNOSIS — G935 Compression of brain: Secondary | ICD-10-CM | POA: Diagnosis present

## 2017-05-16 DIAGNOSIS — T510X1A Toxic effect of ethanol, accidental (unintentional), initial encounter: Secondary | ICD-10-CM | POA: Diagnosis present

## 2017-05-16 DIAGNOSIS — R064 Hyperventilation: Secondary | ICD-10-CM

## 2017-05-16 DIAGNOSIS — F329 Major depressive disorder, single episode, unspecified: Secondary | ICD-10-CM | POA: Diagnosis present

## 2017-05-16 DIAGNOSIS — N400 Enlarged prostate without lower urinary tract symptoms: Secondary | ICD-10-CM | POA: Diagnosis present

## 2017-05-16 DIAGNOSIS — I2510 Atherosclerotic heart disease of native coronary artery without angina pectoris: Secondary | ICD-10-CM | POA: Diagnosis present

## 2017-05-16 DIAGNOSIS — E669 Obesity, unspecified: Secondary | ICD-10-CM | POA: Diagnosis present

## 2017-05-16 DIAGNOSIS — E785 Hyperlipidemia, unspecified: Secondary | ICD-10-CM | POA: Diagnosis present

## 2017-05-16 DIAGNOSIS — I10 Essential (primary) hypertension: Secondary | ICD-10-CM | POA: Diagnosis present

## 2017-05-16 DIAGNOSIS — Z7951 Long term (current) use of inhaled steroids: Secondary | ICD-10-CM

## 2017-05-16 DIAGNOSIS — G4733 Obstructive sleep apnea (adult) (pediatric): Secondary | ICD-10-CM | POA: Diagnosis present

## 2017-05-16 DIAGNOSIS — F418 Other specified anxiety disorders: Secondary | ICD-10-CM | POA: Diagnosis present

## 2017-05-16 DIAGNOSIS — E876 Hypokalemia: Secondary | ICD-10-CM

## 2017-05-16 DIAGNOSIS — E1165 Type 2 diabetes mellitus with hyperglycemia: Secondary | ICD-10-CM | POA: Diagnosis present

## 2017-05-16 DIAGNOSIS — B9620 Unspecified Escherichia coli [E. coli] as the cause of diseases classified elsewhere: Secondary | ICD-10-CM | POA: Diagnosis not present

## 2017-05-16 DIAGNOSIS — R451 Restlessness and agitation: Secondary | ICD-10-CM | POA: Diagnosis present

## 2017-05-16 DIAGNOSIS — Z7982 Long term (current) use of aspirin: Secondary | ICD-10-CM

## 2017-05-16 DIAGNOSIS — G9340 Encephalopathy, unspecified: Secondary | ICD-10-CM | POA: Diagnosis not present

## 2017-05-16 DIAGNOSIS — Z6828 Body mass index (BMI) 28.0-28.9, adult: Secondary | ICD-10-CM

## 2017-05-16 LAB — GLUCOSE, CAPILLARY
GLUCOSE-CAPILLARY: 108 mg/dL — AB (ref 65–99)
GLUCOSE-CAPILLARY: 135 mg/dL — AB (ref 65–99)

## 2017-05-16 LAB — BASIC METABOLIC PANEL
Anion gap: 9 (ref 5–15)
BUN: 18 mg/dL (ref 6–20)
CALCIUM: 9.1 mg/dL (ref 8.9–10.3)
CO2: 25 mmol/L (ref 22–32)
CREATININE: 1.02 mg/dL (ref 0.61–1.24)
Chloride: 101 mmol/L (ref 101–111)
GFR calc non Af Amer: >60 mL/min (ref >60)
Glucose, Bld: 155 mg/dL — ABNORMAL HIGH (ref 65–99)
Potassium: 4.4 mmol/L (ref 3.5–5.1)
SODIUM: 135 mmol/L (ref 135–145)

## 2017-05-16 LAB — URINALYSIS, ROUTINE W REFLEX MICROSCOPIC
BILIRUBIN URINE: NEGATIVE
Glucose, UA: NEGATIVE mg/dL
Ketones, ur: NEGATIVE mg/dL
Leukocytes, UA: NEGATIVE
Nitrite: NEGATIVE
PROTEIN: 30 mg/dL — AB
Squamous Epithelial / LPF: NONE SEEN
pH: 5 (ref 5.0–8.0)

## 2017-05-16 LAB — I-STAT CHEM 8, ED
BUN: 22 mg/dL — AB (ref 6–20)
CALCIUM ION: 1.05 mmol/L — AB (ref 1.15–1.40)
Chloride: 100 mmol/L — ABNORMAL LOW (ref 101–111)
Creatinine, Ser: 0.8 mg/dL (ref 0.61–1.24)
Glucose, Bld: 161 mg/dL — ABNORMAL HIGH (ref 65–99)
HCT: 41 % (ref 39.0–52.0)
HEMOGLOBIN: 13.9 g/dL (ref 13.0–17.0)
Potassium: 4.6 mmol/L (ref 3.5–5.1)
SODIUM: 134 mmol/L — AB (ref 135–145)
TCO2: 23 mmol/L (ref 22–32)

## 2017-05-16 LAB — TRIGLYCERIDES: Triglycerides: 263 mg/dL — ABNORMAL HIGH (ref <150)

## 2017-05-16 LAB — DIFFERENTIAL
Basophils Absolute: 0 10*3/uL (ref 0.0–0.1)
Basophils Relative: 0 %
EOS PCT: 0 %
Eosinophils Absolute: 0 10*3/uL (ref 0.0–0.7)
LYMPHS ABS: 0.7 10*3/uL (ref 0.7–4.0)
LYMPHS PCT: 6 %
MONO ABS: 0.6 10*3/uL (ref 0.1–1.0)
Monocytes Relative: 5 %
Neutro Abs: 10.5 10*3/uL — ABNORMAL HIGH (ref 1.7–7.7)
Neutrophils Relative %: 89 %

## 2017-05-16 LAB — CBC
HCT: 39.8 % (ref 39.0–52.0)
Hemoglobin: 13.7 g/dL (ref 13.0–17.0)
MCH: 28.8 pg (ref 26.0–34.0)
MCHC: 34.4 g/dL (ref 30.0–36.0)
MCV: 83.8 fL (ref 78.0–100.0)
Platelets: 265 10*3/uL (ref 150–400)
RBC: 4.75 MIL/uL (ref 4.22–5.81)
RDW: 12.8 % (ref 11.5–15.5)
WBC: 11.9 10*3/uL — ABNORMAL HIGH (ref 4.0–10.5)

## 2017-05-16 LAB — BLOOD GAS, ARTERIAL
Acid-base deficit: 1.7 mmol/L (ref 0.0–2.0)
BICARBONATE: 22.8 mmol/L (ref 20.0–28.0)
Drawn by: 437071
FIO2: 100
LHR: 18 {breaths}/min
O2 Saturation: 99.4 %
PEEP: 5 cmH2O
PO2 ART: 383 mmHg — AB (ref 83.0–108.0)
Patient temperature: 99.6
VT: 590 mL
pCO2 arterial: 41.1 mmHg (ref 32.0–48.0)
pH, Arterial: 7.366 (ref 7.350–7.450)

## 2017-05-16 LAB — COMPREHENSIVE METABOLIC PANEL
ALT: 36 U/L (ref 17–63)
AST: 32 U/L (ref 15–41)
Albumin: 3.7 g/dL (ref 3.5–5.0)
Alkaline Phosphatase: 87 U/L (ref 38–126)
Anion gap: 8 (ref 5–15)
BUN: 20 mg/dL (ref 6–20)
CO2: 21 mmol/L — ABNORMAL LOW (ref 22–32)
Calcium: 8.4 mg/dL — ABNORMAL LOW (ref 8.9–10.3)
Chloride: 102 mmol/L (ref 101–111)
Creatinine, Ser: 1.01 mg/dL (ref 0.61–1.24)
GFR calc Af Amer: >60 mL/min (ref >60)
GFR calc non Af Amer: >60 mL/min (ref >60)
Glucose, Bld: 165 mg/dL — ABNORMAL HIGH (ref 65–99)
Potassium: 4.6 mmol/L (ref 3.5–5.1)
Sodium: 131 mmol/L — ABNORMAL LOW (ref 135–145)
Total Bilirubin: 0.6 mg/dL (ref 0.3–1.2)
Total Protein: 7 g/dL (ref 6.5–8.1)

## 2017-05-16 LAB — RAPID URINE DRUG SCREEN, HOSP PERFORMED
AMPHETAMINES: NOT DETECTED
BENZODIAZEPINES: POSITIVE — AB
Barbiturates: NOT DETECTED
Cocaine: POSITIVE — AB
OPIATES: NOT DETECTED
Tetrahydrocannabinol: NOT DETECTED

## 2017-05-16 LAB — I-STAT TROPONIN, ED: Troponin i, poc: 0 ng/mL (ref 0.00–0.08)

## 2017-05-16 LAB — PROTIME-INR
INR: 1.11
Prothrombin Time: 14.2 seconds (ref 11.4–15.2)

## 2017-05-16 LAB — MRSA PCR SCREENING: MRSA by PCR: NEGATIVE

## 2017-05-16 LAB — SODIUM: Sodium: 134 mmol/L — ABNORMAL LOW (ref 135–145)

## 2017-05-16 LAB — APTT: aPTT: 31 seconds (ref 24–36)

## 2017-05-16 LAB — ETHANOL: Alcohol, Ethyl (B): <10 mg/dL (ref <10)

## 2017-05-16 MED ORDER — CHLORHEXIDINE GLUCONATE 0.12% ORAL RINSE (MEDLINE KIT)
15.0000 mL | Freq: Two times a day (BID) | OROMUCOSAL | Status: DC
  Administered 2017-05-16 – 2017-06-03 (×35): 15 mL via OROMUCOSAL

## 2017-05-16 MED ORDER — FAMOTIDINE IN NACL 20-0.9 MG/50ML-% IV SOLN
20.0000 mg | Freq: Two times a day (BID) | INTRAVENOUS | Status: DC
  Administered 2017-05-16 – 2017-05-17 (×3): 20 mg via INTRAVENOUS
  Filled 2017-05-16 (×3): qty 50

## 2017-05-16 MED ORDER — SODIUM CHLORIDE 23.4 % INJECTION (4 MEQ/ML) FOR IV ADMINISTRATION
30.0000 mL | Freq: Once | INTRAVENOUS | Status: AC
  Administered 2017-05-16: 30 mL via INTRAVENOUS
  Filled 2017-05-16: qty 30

## 2017-05-16 MED ORDER — MIDAZOLAM HCL 2 MG/2ML IJ SOLN
INTRAMUSCULAR | Status: AC
  Filled 2017-05-16: qty 2

## 2017-05-16 MED ORDER — STROKE: EARLY STAGES OF RECOVERY BOOK
Freq: Once | Status: AC
  Administered 2017-05-26: 1
  Filled 2017-05-16 (×2): qty 1

## 2017-05-16 MED ORDER — ACETAMINOPHEN 325 MG PO TABS: 650.0000 mg | ORAL_TABLET | ORAL | Status: DC | PRN

## 2017-05-16 MED ORDER — SODIUM CHLORIDE 23.4 % INJECTION (4 MEQ/ML) FOR IV ADMINISTRATION
30.0000 mL | INTRAVENOUS | Status: DC
  Filled 2017-05-16 (×3): qty 30

## 2017-05-16 MED ORDER — SENNOSIDES-DOCUSATE SODIUM 8.6-50 MG PO TABS
1.0000 | ORAL_TABLET | Freq: Every day | ORAL | Status: DC
  Administered 2017-05-16 – 2017-06-02 (×14): 1 via ORAL
  Filled 2017-05-16 (×14): qty 1

## 2017-05-16 MED ORDER — PROPOFOL 1000 MG/100ML IV EMUL
INTRAVENOUS | Status: AC
  Administered 2017-05-16: 20 ug/kg/min
  Filled 2017-05-16: qty 100

## 2017-05-16 MED ORDER — VECURONIUM BROMIDE 10 MG IV SOLR
INTRAVENOUS | Status: AC
  Administered 2017-05-16: 6 mg
  Filled 2017-05-16: qty 10

## 2017-05-16 MED ORDER — ACETAMINOPHEN 160 MG/5ML PO SOLN
650.0000 mg | ORAL | Status: DC | PRN
  Administered 2017-05-17 – 2017-05-26 (×9): 650 mg
  Filled 2017-05-16 (×10): qty 20.3

## 2017-05-16 MED ORDER — ETOMIDATE 2 MG/ML IV SOLN
40.0000 mg | Freq: Once | INTRAVENOUS | Status: AC
  Administered 2017-05-16: 40 mg via INTRAVENOUS

## 2017-05-16 MED ORDER — VECURONIUM BROMIDE 10 MG IV SOLR: 6.0000 mg | Freq: Once | INTRAVENOUS | Status: AC

## 2017-05-16 MED ORDER — ACETAMINOPHEN 650 MG RE SUPP
650.0000 mg | RECTAL | Status: DC | PRN
  Administered 2017-05-21 – 2017-05-28 (×4): 650 mg via RECTAL
  Filled 2017-05-16 (×4): qty 1

## 2017-05-16 MED ORDER — SODIUM CHLORIDE 0.9 % IV SOLN: INTRAVENOUS | Status: DC

## 2017-05-16 MED ORDER — ETOMIDATE 2 MG/ML IV SOLN
INTRAVENOUS | Status: AC
  Filled 2017-05-16: qty 20

## 2017-05-16 MED ORDER — LABETALOL HCL 5 MG/ML IV SOLN: 10.0000 mg | INTRAVENOUS | Status: DC | PRN

## 2017-05-16 MED ORDER — MIDAZOLAM HCL 2 MG/2ML IJ SOLN
2.0000 mg | Freq: Once | INTRAMUSCULAR | Status: AC
  Administered 2017-05-16: 2 mg via INTRAVENOUS

## 2017-05-16 MED ORDER — ORAL CARE MOUTH RINSE
15.0000 mL | OROMUCOSAL | Status: DC
  Administered 2017-05-16 – 2017-05-20 (×39): 15 mL via OROMUCOSAL

## 2017-05-16 MED ORDER — INSULIN ASPART 100 UNIT/ML ~~LOC~~ SOLN
0.0000 [IU] | Freq: Three times a day (TID) | SUBCUTANEOUS | Status: DC
  Administered 2017-05-17: 3 [IU] via SUBCUTANEOUS
  Administered 2017-05-17 – 2017-05-24 (×5): 2 [IU] via SUBCUTANEOUS

## 2017-05-16 MED ORDER — FENTANYL CITRATE (PF) 100 MCG/2ML IJ SOLN
50.0000 ug | Freq: Once | INTRAMUSCULAR | Status: AC
  Administered 2017-05-16: 50 ug via INTRAVENOUS

## 2017-05-16 MED ORDER — SODIUM CHLORIDE 3 % IV SOLN
INTRAVENOUS | Status: AC
Start: 2017-05-16 — End: 2017-05-22
  Administered 2017-05-16: 50 mL/h via INTRAVENOUS
  Administered 2017-05-17: 75 mL/h via INTRAVENOUS
  Administered 2017-05-17 – 2017-05-19 (×8): 85 mL/h via INTRAVENOUS
  Administered 2017-05-19 – 2017-05-21 (×6): 50 mL/h via INTRAVENOUS
  Administered 2017-05-22: 75 mL/h via INTRAVENOUS
  Administered 2017-05-22: 40 mL/h via INTRAVENOUS
  Filled 2017-05-16 (×30): qty 500

## 2017-05-16 MED ORDER — FENTANYL CITRATE (PF) 100 MCG/2ML IJ SOLN
INTRAMUSCULAR | Status: AC
  Filled 2017-05-16: qty 2

## 2017-05-16 MED ORDER — PROPOFOL 1000 MG/100ML IV EMUL
5.0000 ug/kg/min | INTRAVENOUS | Status: DC
  Administered 2017-05-16: 45 ug/kg/min via INTRAVENOUS
  Administered 2017-05-17: 20 ug/kg/min via INTRAVENOUS
  Administered 2017-05-17: 60 ug/kg/min via INTRAVENOUS
  Administered 2017-05-17: 80 ug/kg/min via INTRAVENOUS
  Filled 2017-05-16 (×4): qty 100

## 2017-05-16 MED ORDER — IOPAMIDOL (ISOVUE-370) INJECTION 76%
INTRAVENOUS | Status: AC
  Filled 2017-05-16: qty 100

## 2017-05-16 NOTE — ED Triage Notes (Signed)
Pt BIB EMS from home for stroke symptoms. Per EMS, pt LSN at 0200 today when roommate saw him go to bed. Pt taken straight to CT on ED arrival; neurologist and stroke team at bedside.

## 2017-05-16 NOTE — ED Provider Notes (Signed)
56 yo M with left-sided gaze and right-sided paralysis. I evaluated the patient to the bridge. With his right upper extremity weakness and right-sided neglect he was sent urgently to CT scan for a perfusion study.   Melene Plan, DO 05/16/17 2139

## 2017-05-16 NOTE — Consult Note (Addendum)
Neurology Consultation  Reason for Consult: Acute code stroke Referring Physician: Dr. Anitra Lauth  CC: aphasia, right-sided weakness  History is obtained from:EMS  HPI: Henry David is a 56 y.o. male was a past medical history of anxiety, depression, hypertension, hyperlipidemia and coronary artery disease, last seen normal at 2 AM by his roommate, and did not wake up this evening. Upon checking on him, he had gaze to the left, was not talking and was unable to move the right side of his body. EMS was called and they brought him to the hospital emergency room here at [facility] as an acute code stroke. Patient is unable to provide any further history. There is no other family member attendant at the bedside to provide history. I examined the patient as he was rolled in as an acute code stroke in the emergency room bridge. Initial exam was consistent with a complete left MCA syndrome. 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 He was outside the TPA window on presentation  LKW: 0200 hrs. On 05/16/2017 tpa given?: no, outside the window Premorbid modified Rankin scale (mRS):  0   ROS:  Unable to obtain due to altered mental status.   Past Medical History:  Diagnosis Date  . Anxiety   . Apical mural thrombus   . BPH (benign prostatic hyperplasia)   . Depression   . Diverticulitis   . Elevated cholesterol   . Essential hypertension   . Post traumatic stress disorder   . STEMI (ST elevation myocardial infarction) (HCC)     Family History  Problem Relation Age of Onset  . Heart disease Mother   . Heart disease Father     Social History:   reports that he quit smoking about 22 months ago. His smoking use included Cigarettes. He smoked 0.00 packs per  day. He has quit using smokeless tobacco. He reports that he drinks about 2.4 oz of alcohol per week . He reports that he does not use drugs.  Medications  Current Facility-Administered Medications:  .   stroke: mapping our early stages of recovery book, , Does not apply, Once, Lynnell Jude, MD .  0.9 %  sodium chloride infusion, , Intravenous, Continuous, Lynnell Jude, MD .  acetaminophen (TYLENOL) tablet 650 mg, 650 mg, Oral, Q4H PRN **OR** acetaminophen (TYLENOL) solution 650 mg, 650 mg, Per Tube, Q4H PRN **OR** acetaminophen (TYLENOL) suppository 650 mg, 650 mg, Rectal, Q4H PRN, Lynnell Jude, MD .  famotidine (PEPCID) IVPB 20 mg premix, 20 mg, Intravenous, Q12H, Lynnell Jude, MD .  insulin aspart (novoLOG) injection 0-15 Units, 0-15 Units, Subcutaneous, TID WC, Lynnell Jude, MD .  iopamidol (ISOVUE-370) 76 % injection, , , ,  .  senna-docusate (Senokot-S) tablet 1 tablet, 1 tablet, Oral, QHS, Lynnell Jude, MD .  sodium chloride 23.4 % (4 mEq/mL) IV in VIAFLEX BAG, 30 mL, Intravenous, Once, Lynnell Jude, MD  Current Outpatient Prescriptions:  .  acetaminophen (TYLENOL) 500 MG tablet, Take 500 mg by mouth every 6 (six) hours as needed., Disp: , Rfl:  .  aspirin 81 MG EC tablet, Take 1 tablet (81 mg total) by mouth daily., Disp: 30 tablet, Rfl: 0 .  atorvastatin (LIPITOR) 80 MG tablet, TAKE 1 TABLET BY MOUTH  DAILY AT 6 PM., Disp: 30 tablet, Rfl: 3 .  buPROPion (WELLBUTRIN SR) 150 MG 12 hr tablet, TAKE ONE TABLET BY MOUTH TWICE DAILY, Disp: 60 tablet, Rfl: 3 .  cetirizine (ZYRTEC) 10 MG tablet, TAKE 1 TABLET BY MOUTH DAILY., Disp: 30 tablet, Rfl: 3 .  clopidogrel (PLAVIX) 75 MG tablet, TAKE 1 TABLET BY MOUTH DAILY., Disp: 90 tablet, Rfl: 0 .  cyclobenzaprine (FLEXERIL) 10 MG tablet, TAKE 1 TABLET BY MOUTH 2 TIMES DAILY AS NEEDED FOR MUSCLE SPASMS., Disp: 60 tablet, Rfl: 1 .  famotidine (PEPCID) 20 MG tablet, Take 1 tablet (20 mg total) by mouth 2 (two) times daily., Disp: 60  tablet, Rfl: 3 .  fluticasone (FLONASE) 50 MCG/ACT nasal spray, PLACE 2 SPRAYS INTO BOTH NOSTRILS DAILY., Disp: 16 g, Rfl: 3 .  hydrOXYzine (ATARAX/VISTARIL) 25 MG tablet, Take 1 tablet (25 mg total) by mouth 3 (three) times daily as needed., Disp: 60 tablet, Rfl: 3 .  lisinopril (PRINIVIL,ZESTRIL) 2.5 MG tablet, TAKE 1 TABLET BY MOUTH AT BEDTIME., Disp: 30 tablet, Rfl: 3 .  metoprolol tartrate (LOPRESSOR) 25 MG tablet, TAKE 1 TABLET BY MOUTH 2 TIMES DAILY., Disp: 60 tablet, Rfl: 3 .  NITROSTAT 0.4 MG SL tablet, PLACE 1 TABLET UNDER THE TONGUE EVERY 5 MINUTES AS NEEDED FOR CHEST PAIN. MAX-3 DOSES WITHIN A 15 MINUTE INTERVAL, Disp: 25 tablet, Rfl: 0 .  olopatadine (PATANOL) 0.1 % ophthalmic solution, PLACE 1 DROP INTO BOTH EYES 2 TIMES DAILY., Disp: 15 mL, Rfl: 0 .  Omega-3 Fatty Acids (FISH OIL) 1000 MG CAPS, Take 2 capsules by mouth 2 (two) times daily., Disp: , Rfl:  .  sertraline (ZOLOFT) 100 MG tablet, Take 1 tablet (100 mg total) by mouth daily., Disp: 30 tablet, Rfl: 3 .  tamsulosin (FLOMAX) 0.4 MG CAPS capsule, TAKE 1 CAPSULE BY MOUTH DAILY., Disp: 30 capsule, Rfl: 3   Exam: Current vital signs: BP (!) 157/80   Pulse 93   Resp 18   SpO2 93%  Vital signs in last 24 hours: Pulse Rate:  [93-99] 93 (10/05 1630) Resp:  [15-19] 18 (10/05 1630) BP: (124-157)/(64-80) 157/80 (10/05 1630) SpO2:  [93 %-95 %] 93 % (10/05 1630)  GENERAL: Awake, alert in NAD HEENT: - Normocephalic and atraumatic, dry mm, no LN++, no Thyromegally LUNGS - Clear to auscultation bilaterally with no wheezes CV - S1S2 RRR, no m/r/g, equal pulses bilaterally. ABDOMEN - Soft, nontender, nondistended with normoactive BS Ext: warm, well perfused, intact peripheral pulses,no edema  NEURO:  Mental Status: lert awake and completely aphasic Language: expressive and receptive aphasia Cranial Nerves: PERRL 82mm/brisk. EOMI, right-sided hemianopsia, right lower facial weakness facial sensation intact, hearing intact,  tongue/uvula/soft palate midline,  Motor: 0/5 right upper and right lower. Antigravity left upper and left lower extremity. Tone: is normal and bulk is normal Sensation- sensory loss and neglect of the right side. Coordination: unable to assess as patient is off all commands Gait- deferred  NIHSS 1a Level of Conscious.: 0 1b LOC Questions: 2 1c LOC Commands: 2 2 Best Gaze: 2 3 Visual: 2 4 Facial Palsy: 1 5a Motor Arm - left: 0 5b Motor Arm - Right: 3 6a Motor Leg - Left: 0 6b Motor Leg - Right: 3 7 Limb Ataxia: 0 8 Sensory: 1 9 Best Language: 3 10 Dysarthria: 2 11 Extinct. and Inatten.: 2 TOTAL: 23  Labs I have reviewed labs in epic and the results pertinent to this consultation are:  CBC    Component Value Date/Time  WBC 11.9 (H) 05/16/2017 1537   RBC 4.75 05/16/2017 1537   HGB 13.9 05/16/2017 1540   HCT 41.0 05/16/2017 1540   PLT 265 05/16/2017 1537   MCV 83.8 05/16/2017 1537   MCH 28.8 05/16/2017 1537   MCHC 34.4 05/16/2017 1537   RDW 12.8 05/16/2017 1537   LYMPHSABS 0.7 05/16/2017 1537   MONOABS 0.6 05/16/2017 1537   EOSABS 0.0 05/16/2017 1537   BASOSABS 0.0 05/16/2017 1537    CMP     Component Value Date/Time   NA 134 (L) 05/16/2017 1540   NA 141 12/12/2016 1118   K 4.6 05/16/2017 1540   CL 100 (L) 05/16/2017 1540   CO2 21 (L) 05/16/2017 1537   GLUCOSE 161 (H) 05/16/2017 1540   BUN 22 (H) 05/16/2017 1540   BUN 14 12/12/2016 1118   CREATININE 0.80 05/16/2017 1540   CREATININE 1.05 03/08/2016 1116   CALCIUM 8.4 (L) 05/16/2017 1537   PROT 7.0 05/16/2017 1537   PROT 6.8 12/12/2016 1118   ALBUMIN 3.7 05/16/2017 1537   ALBUMIN 4.2 12/12/2016 1118   AST 32 05/16/2017 1537   ALT 36 05/16/2017 1537   ALKPHOS 87 05/16/2017 1537   BILITOT 0.6 05/16/2017 1537   BILITOT 0.3 12/12/2016 1118   GFRNONAA >60 05/16/2017 1537   GFRNONAA 79 03/08/2016 1116   GFRAA >60 05/16/2017 1537   GFRAA >89 03/08/2016 1116    Lipid Panel     Component Value  Date/Time   CHOL 114 12/12/2016 1118   TRIG 237 (H) 12/12/2016 1118   HDL 27 (L) 12/12/2016 1118   CHOLHDL 4.2 12/12/2016 1118   CHOLHDL 4.9 03/08/2016 1116   VLDL 46 (H) 03/08/2016 1116   LDLCALC 40 12/12/2016 1118   LDLDIRECT 103.1 05/22/2007 1191     Imaging I have reviewed the images obtained:  CT-scan of the brain shows established hypodensity in all of the left MCA territory including the temporal lobe. There is some mass effect on the lateral ventricle on the left. Ventricle is system is open. 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 He already has cerebral edema on left cerebral hemisphere with mass effect on the left lateral ventricle. There is no hydrocephalus as yet. He is at high risk for herniation as he is still on day 1 of the stroke.  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 He is a hemi-crani watch. NSGY aware. -Frequent neuro checks -Currently protecting his airway. Needs frequent vitals and monitoring of the airway. -I would recommend starting him on hypertonic saline. Can start 3% with a maximum rate through peripheral IV or bolus 23.4%. Goal Na 140-145. -I would recommend doing a repeat CT scan in 12 hours to assess for hydrocephalus. -I have spoken with Dr. Pearlean Brownie (stroke attending over the following the patient in the a.m.), who has been in touch with the on-call neurosurgeon and made him aware of this  case. -If patient's mentation worsens and his neurological examination deteriorates, please let on-call neurologist know  andwe will call neurosurgery for hemi-craniectomy. -Stroke workup to include echocardiogram, lipid panel, including A1c. -Maintain on telemetry.  -Can initiate ASA now or can wait for 24h in case he needs OR.  Please call us with questions Stroke team will follow in the morning. -- Milon Dikes, MD [facility] [phone]  If 7pm to 7am, please call on call as listed on AMION.  Critical care attestation This patient is critically ill and at significant risk of neurological worsening, death and care requires constant monitoring of vital signs, hemodynamics,respiratory and cardiac monitoring, neurological assessment, discussion with family, other specialists and medical decision making of high complexity. I spent 45  minutes of neurocritical care time  in the care of  this patient.

## 2017-05-16 NOTE — Procedures (Signed)
Central venous catheter was placed to administer hypertonic saline. No surrogate is available for informed consent and the procedure was performed as medically indicated. A timeout was performed. The area over the right subclavian was sterilely prepped with chlorhexidine and widely draped. Sterile garb was donned, and the right subclavian vein easily cannulated. A wire was gently passed. A dilator passed over the wire and a 7 French 20 cm triple lumen catheter easily placed. There was good flow from all ports. The catheter was sutured in place at 17 cm and a sterile dressing applied. Chest x-ray shows good catheter placement and no pneumothorax

## 2017-05-16 NOTE — ED Notes (Signed)
Attempted to contact Hassie Bruce, pts Emergency contact. No answer and voice mailbox full.

## 2017-05-16 NOTE — Procedures (Signed)
Endotracheal intubation  Patient was intubated for airway protection. Doesn't the presence of intracranial hypertension due to sedation was employed. He was initially given 2 mg of Versed followed by 50 mcg of fentanyl. He was bagged and then given a total 40 mg of etomidate followed by 6 mg of Norcuron. He does have a class 3-4 airway and visualization was somewhat difficult using a 4 mm blade. He was successfully intubated with an 8 endotracheal tube placed at 23 cm at the lip. CO2 was positive and there was symmetric air movement. Chest x-ray shows a well-placed endotracheal tube.

## 2017-05-16 NOTE — H&P (Signed)
PULMONARY / CRITICAL CARE MEDICINE   Name: Henry David MRN: [id number] DOB: 1961/01/19    ADMISSION DATE:  05/16/2017     CHIEF COMPLAINT:  Found Down  HISTORY OF PRESENT ILLNESS:   This is a 56 year old with a history of coronary artery disease who was found down earlier today. He was last seen normal at 2 AM and it is 1816 at time of this dictation. CT scan of the head is already showing a large area of MCA territory infarction. 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 He is unable to provide any history and on arrival in the intensive care unit his respirations were intermittently sonorous and I have intubated him for airway protection.  PAST MEDICAL HISTORY :  He  has a past medical history of Anxiety; Apical mural thrombus; BPH (benign prostatic hyperplasia); Depression; Diverticulitis; Elevated cholesterol; Essential hypertension; Post traumatic stress disorder; and STEMI (ST elevation myocardial infarction) (HCC).  PAST SURGICAL HISTORY: He  has a past surgical history that includes Knee surgery; Cardiac catheterization (N/A, 07/09/2015); Cardiac catheterization (N/A, 07/09/2015); and Cardiac catheterization (N/A, 07/11/2015).  Allergies  Allergen Reactions  . Coconut Fatty Acids     unknown    No current facility-administered medications on file prior to encounter.    Current Outpatient Prescriptions on File Prior to Encounter  Medication Sig  . acetaminophen (TYLENOL) 500 MG tablet Take 500 mg by mouth every 6 (six) hours as needed.  Marland Kitchen aspirin 81 MG EC tablet Take 1 tablet (81 mg total) by mouth daily.  Marland Kitchen atorvastatin (LIPITOR) 80 MG tablet TAKE 1 TABLET BY MOUTH DAILY AT 6 PM.  . buPROPion (WELLBUTRIN SR) 150 MG 12 hr tablet TAKE ONE TABLET BY MOUTH TWICE DAILY  . cetirizine (ZYRTEC) 10 MG tablet TAKE 1 TABLET BY MOUTH DAILY.  Marland Kitchen clopidogrel (PLAVIX) 75 MG tablet TAKE 1 TABLET BY MOUTH DAILY.  . cyclobenzaprine (FLEXERIL) 10 MG  tablet TAKE 1 TABLET BY MOUTH 2 TIMES DAILY AS NEEDED FOR MUSCLE SPASMS.  . famotidine (PEPCID) 20 MG tablet Take 1 tablet (20 mg total) by mouth 2 (two) times daily.  . fluticasone (FLONASE) 50 MCG/ACT nasal spray PLACE 2 SPRAYS INTO BOTH NOSTRILS DAILY.  . hydrOXYzine (ATARAX/VISTARIL) 25 MG tablet Take 1 tablet (25 mg total) by mouth 3 (three) times daily as needed.  Marland Kitchen lisinopril (PRINIVIL,ZESTRIL) 2.5 MG tablet TAKE 1 TABLET BY MOUTH AT BEDTIME.  . metoprolol tartrate (LOPRESSOR) 25 MG tablet TAKE 1 TABLET BY MOUTH 2 TIMES DAILY.  Marland Kitchen NITROSTAT 0.4 MG SL tablet PLACE 1 TABLET UNDER THE TONGUE EVERY 5 MINUTES AS NEEDED FOR CHEST PAIN. MAX-3 DOSES WITHIN A 15 MINUTE INTERVAL  . olopatadine (PATANOL) 0.1 % ophthalmic solution PLACE 1 DROP INTO BOTH EYES 2 TIMES DAILY.  Marland Kitchen Omega-3 Fatty Acids (FISH OIL) 1000 MG CAPS Take 2 capsules by mouth 2 (two) times daily.  . sertraline (ZOLOFT) 100 MG tablet Take 1 tablet (100 mg total) by mouth daily.  . tamsulosin (FLOMAX) 0.4 MG CAPS capsule TAKE 1 CAPSULE BY MOUTH DAILY.    FAMILY HISTORY:  His indicated that his mother is deceased. He indicated that his father is deceased.    SOCIAL HISTORY: He  reports that he quit smoking about 22 months ago. His smoking use included Cigarettes. He smoked 0.00 packs per day. He has quit using smokeless tobacco. He reports that he drinks about 2.4 oz of alcohol per week . He reports that he  does not use drugs.  REVIEW OF SYSTEMS:   Unobtainable  SUBJECTIVE:  Unobtainable  VITAL SIGNS: BP 138/87   Pulse 89   Resp 17   SpO2 91%   HEMODYNAMICS:    VENTILATOR SETTINGS:    INTAKE / OUTPUT: No intake/output data recorded.  PHYSICAL EXAMINATION: General:  This is a somewhat obese male who appears his stated age. Neuro: He was initially mumbling incoherently, he was no longer doing so at the time of intubation. There was a right facial droop, with equal pupils on the left gaze preference. Left limbs move  spontaneously and vigorously, right limbs only move to a very limited extent in response to noxious stimuli. HEENT:  He has a short thick neck. Cardiovascular:  Swelling and S2 were distant and regular without murmur rub or gallop. Limbs are warm without dependent edema. Lungs:  Following intubation there is symmetric air movement and no wheezes Abdomen:  The abdomen is obese without any overt organomegaly masses tenderness guarding or rebound. LABS:  BMET  Recent Labs Lab 05/16/17 1537 05/16/17 1540  NA 131* 134*  K 4.6 4.6  CL 102 100*  CO2 21*  --   BUN 20 22*  CREATININE 1.01 0.80  GLUCOSE 165* 161*    Electrolytes  Recent Labs Lab 05/16/17 1537  CALCIUM 8.4*    CBC  Recent Labs Lab 05/16/17 1537 05/16/17 1540  WBC 11.9*  --   HGB 13.7 13.9  HCT 39.8 41.0  PLT 265  --     Coag's  Recent Labs Lab 05/16/17 1537  APTT 31  INR 1.11    Sepsis Markers No results for input(s): LATICACIDVEN, PROCALCITON, O2SATVEN in the last 168 hours.  ABG No results for input(s): PHART, PCO2ART, PO2ART in the last 168 hours.  Liver Enzymes  Recent Labs Lab 05/16/17 1537  AST 32  ALT 36  ALKPHOS 87  BILITOT 0.6  ALBUMIN 3.7    Cardiac Enzymes No results for input(s): TROPONINI, PROBNP in the last 168 hours.  Glucose No results for input(s): GLUCAP in the last 168 hours.  Imaging Ct Angio Head W Or Wo Contrast  Result Date: 05/16/2017 CLINICAL DATA:  Right-sided weakness. Last seen normal 13 hours ago. EXAM: CT ANGIOGRAPHY HEAD AND NECK TECHNIQUE: Multidetector CT imaging of the head and neck was performed using the standard protocol during bolus administration of intravenous contrast. Multiplanar CT image reconstructions and MIPs were obtained to evaluate the vascular anatomy. Carotid stenosis measurements (when applicable) are obtained utilizing NASCET criteria, using the distal internal carotid diameter as the denominator. CONTRAST:  100 mL Isovue 370  COMPARISON:  None. FINDINGS: CTA NECK FINDINGS Aortic arch: A 3 vessel arch configuration is present. There is no significant atherosclerotic calcification at the aortic arch. No focal stenosis is present. Right carotid system: The right common carotid artery is within normal limits. The right carotid bifurcation is normal. The cervical right ICA is unremarkable. Left carotid system: The left common carotid artery is within normal limits. Bifurcation is unremarkable. There is narrowing of the more distal left ICA without a focal transition. Vertebral arteries: Both vertebral arteries originate from the subclavian arteries without significant focal stenosis. The vertebral arteries are codominant. There is no significant stenosis of either vessel within the neck. Skeleton: Vertebral body heights and alignment are normal. Endplate change in uncovertebral disease is most noted at C6-7 with right greater than left foraminal stenosis. No focal lytic or blastic lesions are present. Other neck: The soft tissues of  the neck are otherwise unremarkable. Upper chest: Mild dependent atelectasis is present at the lung apices. There is no nodule or mass lesion. No significant airspace consolidation is present. The upper mediastinum is otherwise unremarkable. Review of the MIP images confirms the above findings CTA HEAD FINDINGS Anterior circulation: There is decreased caliber of the right internal carotid artery from the skull base to complete occlusion within the cavernous segment. There is retrograde filling of the left A1 segment. There there is no reconstitution of the ICA terminus or left MCA. Minimal atherosclerotic calcifications are present within the cavernous right internal carotid artery. Right ICA is normal. The right A1 and M1 segments are normal. There is an early bifurcation of the right MCA. The right A1 segment is patent. The anterior communicating artery is patent. The left A1 fills. The right MCA bifurcation is  intact. Right MCA and bilateral ACA branch vessels are within normal limits. Limited collateral flow is present in the left hemisphere. Posterior circulation: The vertebral arteries are codominant. The vertebrobasilar junction is normal. The basilar artery is normal. Both posterior cerebral arteries originate from basilar tip. PCA branch vessels are within normal limits. Venous sinuses: The dural sinuses are patent. The right transverse sinus is dominant. The straight sinus and deep cerebral veins are intact. Anatomic variants: None. Delayed phase: Not performed Review of the MIP images confirms the above findings IMPRESSION: 1. 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 2. No significant collateral flow to the left MCA territory. 3. Patent anterior communicating artery with retrograde filling of the left A1 segment. 4. Minimal atherosclerotic calcification within the cavernous right internal carotid artery. 5. No other significant atherosclerotic disease within the head or neck. 6. Degenerative changes of the cervical spine are most pronounced at C6-7. These results were text paged at the time of interpretation on 05/16/2017 at 3:47 pm to Dr. Jerrell Belfast . Electronically Signed   By: Marin Roberts M.D.   On: 05/16/2017 15:55   Ct Angio Neck W Or Wo Contrast  Result Date: 05/16/2017 CLINICAL DATA:  Right-sided weakness. Last seen normal 13 hours ago. EXAM: CT ANGIOGRAPHY HEAD AND NECK TECHNIQUE: Multidetector CT imaging of the head and neck was performed using the standard protocol during bolus administration of intravenous contrast. Multiplanar CT image reconstructions and MIPs were obtained to evaluate the vascular anatomy. Carotid stenosis measurements (when applicable) are obtained utilizing NASCET criteria, using the distal internal carotid diameter as the denominator. CONTRAST:  100 mL Isovue 370 COMPARISON:  None. FINDINGS: CTA  NECK FINDINGS Aortic arch: A 3 vessel arch configuration is present. There is no significant atherosclerotic calcification at the aortic arch. No focal stenosis is present. Right carotid system: The right common carotid artery is within normal limits. The right carotid bifurcation is normal. The cervical right ICA is unremarkable. Left carotid system: The left common carotid artery is within normal limits. Bifurcation is unremarkable. There is narrowing of the more distal left ICA without a focal transition. Vertebral arteries: Both vertebral arteries originate from the subclavian arteries without significant focal stenosis. The vertebral arteries are codominant. There is no significant stenosis of either vessel within the neck. Skeleton: Vertebral body heights and alignment are normal. Endplate change in uncovertebral disease is most noted at C6-7 with right greater than left foraminal stenosis. No focal lytic or blastic lesions are present. Other neck: The soft tissues of the neck are otherwise unremarkable. Upper chest: Mild dependent atelectasis is  present at the lung apices. There is no nodule or mass lesion. No significant airspace consolidation is present. The upper mediastinum is otherwise unremarkable. Review of the MIP images confirms the above findings CTA HEAD FINDINGS Anterior circulation: There is decreased caliber of the right internal carotid artery from the skull base to complete occlusion within the cavernous segment. There is retrograde filling of the left A1 segment. There there is no reconstitution of the ICA terminus or left MCA. Minimal atherosclerotic calcifications are present within the cavernous right internal carotid artery. Right ICA is normal. The right A1 and M1 segments are normal. There is an early bifurcation of the right MCA. The right A1 segment is patent. The anterior communicating artery is patent. The left A1 fills. The right MCA bifurcation is intact. Right MCA and bilateral  ACA branch vessels are within normal limits. Limited collateral flow is present in the left hemisphere. Posterior circulation: The vertebral arteries are codominant. The vertebrobasilar junction is normal. The basilar artery is normal. Both posterior cerebral arteries originate from basilar tip. PCA branch vessels are within normal limits. Venous sinuses: The dural sinuses are patent. The right transverse sinus is dominant. The straight sinus and deep cerebral veins are intact. Anatomic variants: None. Delayed phase: Not performed Review of the MIP images confirms the above findings IMPRESSION: 1. 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 2. No significant collateral flow to the left MCA territory. 3. Patent anterior communicating artery with retrograde filling of the left A1 segment. 4. Minimal atherosclerotic calcification within the cavernous right internal carotid artery. 5. No other significant atherosclerotic disease within the head or neck. 6. Degenerative changes of the cervical spine are most pronounced at C6-7. These results were text paged at the time of interpretation on 05/16/2017 at 3:47 pm to Dr. Jerrell Belfast . Electronically Signed   By: Marin Roberts M.D.   On: 05/16/2017 15:55   Ct Cerebral Perfusion W Contrast  Result Date: 05/16/2017 CLINICAL DATA:  Left MCA territory infarct. Last seen normal 13 hours ago. EXAM: CT PERFUSION BRAIN TECHNIQUE: Multiphase CT imaging of the brain was performed following IV bolus contrast injection. Subsequent parametric perfusion maps were calculated using RAPID software. CONTRAST:  100 mL Isovue 370 COMPARISON:  CT head without contrast FINDINGS: CT Brain Perfusion Findings: CBF (<30%) Volume: Perfusion (Tmax>6.0s) volume: Mismatch Volume: 44mL Infarction Location:Left MCA territory IMPRESSION: 1. Large left MCA territory nonhemorrhagic infarct neck exceeds size criteria for  intra-arterial intervention. Electronically Signed   By: Marin Roberts M.D.   On: 05/16/2017 15:56   Ct Head Code Stroke Wo Contrast  Result Date: 05/16/2017 CLINICAL DATA:  Code stroke. Right-sided weakness. Last seen normal 13 hours ago. EXAM: CT HEAD WITHOUT CONTRAST TECHNIQUE: Contiguous axial images were obtained from the base of the skull through the vertex without intravenous contrast. COMPARISON:  None. FINDINGS: Brain: A large left MCA territory infarct is present with loss of Bartt Gonzaga-white differentiation some falls both super ganglionic in ganglionic level cortex. There is loss of density in the lentiform nucleus as well as portions of the left caudate. The left internal capsule is involved. The insular ribbon is involved. The infarct extends to the temporal tip. There is no hemorrhage. A hyperdense left MCA is present. Mass effect is present with partial effacement of the ventral CSF. There is no significant midline shift. The right hemisphere is unremarkable. The brainstem and cerebellum are normal. Vascular: Hyperdense left  MCA. Skull: The calvarium is intact. No focal lytic or blastic lesions are present. Sinuses/Orbits: Mucosal thickening is present within the inferior right frontal sinus. A small polyp or mucous retention cyst is present inferiorly in the left maxillary sinus. The remaining paranasal sinuses and mastoid air cells are clear. ASPECTS [facility] Stroke Program Early CT Score) - Ganglionic level infarction (caudate, lentiform nuclei, internal capsule, insula, M1-M3 cortex): 0/7 - Supraganglionic infarction (M4-M6 cortex): 1/3 Total score (0-10 with 10 being normal): 1/10 IMPRESSION: 1. Large left MCA nonhemorrhagic infarct with minimal sparing of super ganglionic cortex. 2. Mass effect with effacement of the sulci and partial effacement of the left lateral ventricle. 3. ASPECTS is 10/10 These results were called by telephone at the time of interpretation on 05/16/2017 at 3:28 pm to  Dr. Milon Dikes , who verbally acknowledged these results. Electronically Signed   By: Marin Roberts M.D.   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 I placed a central line started him on hypertonic saline. Neurosurgery is aware of the situation and we are anticipating a craniectomy in the morning. We will be allowing permissive hypertension overnight. Chart indicates that he has had glucose intolerance in the past, if he proves to be glucose intolerant during this admission and will contemplate the addition of glyburide as an adjunct for control of cerebral edema. DVT prophylaxis will be with SCDs only C is at high risk of CNS bleeding, GI prophylaxis has been provided.  Greater than 35 minutes was spent in the care of this patient today not counting time performing procedures  Penny Pia, MD Critical Care Medicine [facility] Pager: [phone]  05/16/2017, 6:15 PM

## 2017-05-16 NOTE — Code Documentation (Signed)
56yo male arriving to [facility] via [facility] at 66.  Patient from home where he was found unresponsive by his roommate.  LKW [id number].  EMS called and assessed left gaze and right hemiplegia and notified ED Charge RN of FAST-ED 6.  Patient assessed on arrival to be VAN+ and code stroke LVO+ activated.  Stroke team to the bedside and patient to CT with team.  CT followed by CTA and CTP completed.  NIHSS 27, see documentation for details and code stroke times.  Patient nonverbal, left gaze preference, right hemiplegia and unable to follow commands on exam.  Patient is not an endovascular candidate and is outside the window for treatment with tPA.  Patient to be admitted to ICU.

## 2017-05-16 NOTE — Progress Notes (Signed)
eLink Physician-Brief Progress Note Patient Name: Henry David DOB: 04-Mar-1961 MRN: [id number]   Date of Service  05/16/2017  HPI/Events of Note  None purposeful attempting to remove ETT  eICU Interventions  Restraints sent     Intervention Category Major Interventions: Other:  YACOUB,WESAM 05/16/2017, 7:51 PM

## 2017-05-16 NOTE — ED Provider Notes (Signed)
[facility]-EMERGENCY DEPT Provider Note   CSN: [id number] Arrival date & time: 05/16/17  1505   An emergency department physician performed an initial assessment on this suspected stroke patient at 56.  History   Chief Complaint Chief Complaint  Patient presents with  . Code Stroke    HPI Henry David is a 56 y.o. male.  Patient is a 56 year old male with a history of STEMI, mural thrombus, diverticulitis, cardiomyopathy, prediabetes presenting today as a code stroke. Patient had positive LVO score in the field mass normal at 2 AM reported by his roommate. Roommate went to check on him this afternoon he was not himself. When EMS arrived patient was altered, unable to speak, showing evidence of paralysis to the right side with a gaze preference.   The history is provided by the EMS personnel and a friend. The history is limited by the condition of the patient.    Past Medical History:  Diagnosis Date  . Anxiety   . Apical mural thrombus   . BPH (benign prostatic hyperplasia)   . Depression   . Diverticulitis   . Elevated cholesterol   . Essential hypertension   . Post traumatic stress disorder   . STEMI (ST elevation myocardial infarction) [facility])     Patient Active Problem List   Diagnosis Date Noted  . 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 Long term (current) use of anticoagulants [Z79.01] 01/03/2016  . Chest pain 12/29/2015  . Apical mural thrombus   . Pain in the chest   . Essential hypertension   . BPH (benign prostatic hyperplasia)   . Lower urinary tract symptoms 10/04/2015  . Obesity 10/04/2015  . Cardiomyopathy, ischemic 07/20/2015  . Prediabetes 07/13/2015  . Presence of drug coated stent in right coronary artery 07/12/2015  . 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 Hyperlipidemia with target LDL less than 70 07/10/2015  . CAD S/P LAD and RCA DES 07/10/2015  . Presence of drug coated stent in LAD coronary artery   . STEMI 07/09/15   . UPPER  RESPIRATORY INFECTION (URI) 06/30/2009  . LACERATION-FINGER 06/30/2009  . ERECTILE DYSFUNCTION 06/22/2008  . ACTINIC KERATOSIS 09/23/2007  . Depression with anxiety 06/03/2007  . INFECTION, URINARY TRACT NOS 06/03/2007  . SHOULDER PAIN 06/03/2007  . 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 GERD 03/07/2007  . 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 DIVERTICULITIS, HX OF 03/07/2007    Past Surgical History:  Procedure Laterality Date  . CARDIAC CATHETERIZATION N/A 07/09/2015   Procedure: Left Heart Cath and Coronary Angiography;  Surgeon: Lennette Bihari, MD;  Location: [facility] INVASIVE CV LAB;  Service: Cardiovascular;  Laterality: N/A;  . CARDIAC CATHETERIZATION N/A 07/09/2015   Procedure: Coronary Stent Intervention;  Surgeon: Lennette Bihari, MD;  Location: [facility] INVASIVE CV LAB;  Service: Cardiovascular;  Laterality: N/A;  . CARDIAC CATHETERIZATION N/A 07/11/2015   Procedure: Coronary Stent Intervention;  Surgeon: Lennette Bihari, MD;  Location: [facility] INVASIVE CV LAB;  Service: Cardiovascular;  Laterality: N/A;  . KNEE SURGERY         Home Medications    Prior to Admission medications   Medication Sig Start Date End Date Taking? Authorizing Provider  acetaminophen (TYLENOL) 500 MG tablet Take 500 mg by mouth every 6 (six) hours as needed.    [provider]  aspirin 81 MG EC tablet Take 1 tablet (81 mg total) by mouth daily. 01/22/16   Jaclyn Shaggy, MD  atorvastatin (LIPITOR) 80 MG tablet TAKE 1  TABLET BY MOUTH DAILY AT 6 PM. 03/20/17   Jaclyn Shaggy, MD  buPROPion (WELLBUTRIN SR) 150 MG 12 hr tablet TAKE ONE TABLET BY MOUTH TWICE DAILY 03/14/17   Jaclyn Shaggy, MD  cetirizine (ZYRTEC) 10 MG tablet TAKE 1 TABLET BY MOUTH DAILY. 03/11/17   Jaclyn Shaggy, MD  clopidogrel (PLAVIX) 75 MG tablet TAKE 1 TABLET BY MOUTH DAILY. 03/11/17   Jaclyn Shaggy, MD  cyclobenzaprine (FLEXERIL) 10 MG tablet TAKE 1 TABLET BY MOUTH 2 TIMES DAILY AS NEEDED FOR MUSCLE SPASMS. 04/16/17   Jaclyn Shaggy, MD    famotidine (PEPCID) 20 MG tablet Take 1 tablet (20 mg total) by mouth 2 (two) times daily. 03/20/17   Jaclyn Shaggy, MD  fluticasone (FLONASE) 50 MCG/ACT nasal spray PLACE 2 SPRAYS INTO BOTH NOSTRILS DAILY. 03/20/17   Jaclyn Shaggy, MD  hydrOXYzine (ATARAX/VISTARIL) 25 MG tablet Take 1 tablet (25 mg total) by mouth 3 (three) times daily as needed. 04/24/17   Jaclyn Shaggy, MD  lisinopril (PRINIVIL,ZESTRIL) 2.5 MG tablet TAKE 1 TABLET BY MOUTH AT BEDTIME. 04/15/17   Jaclyn Shaggy, MD  metoprolol tartrate (LOPRESSOR) 25 MG tablet TAKE 1 TABLET BY MOUTH 2 TIMES DAILY. 03/14/17   Jaclyn Shaggy, MD  NITROSTAT 0.4 MG SL tablet PLACE 1 TABLET UNDER THE TONGUE EVERY 5 MINUTES AS NEEDED FOR CHEST PAIN. MAX-3 DOSES WITHIN A 15 MINUTE INTERVAL 05/13/17   Jaclyn Shaggy, MD  olopatadine (PATANOL) 0.1 % ophthalmic solution PLACE 1 DROP INTO BOTH EYES 2 TIMES DAILY. 04/15/17   Jaclyn Shaggy, MD  Omega-3 Fatty Acids (FISH OIL) 1000 MG CAPS Take 2 capsules by mouth 2 (two) times daily.    [provider]  sertraline (ZOLOFT) 100 MG tablet Take 1 tablet (100 mg total) by mouth daily. 03/20/17   Jaclyn Shaggy, MD  tamsulosin (FLOMAX) 0.4 MG CAPS capsule TAKE 1 CAPSULE BY MOUTH DAILY. 04/15/17   Jaclyn Shaggy, MD    Family History Family History  Problem Relation Age of Onset  . Heart disease Mother   . Heart disease Father     Social History Social History  Substance Use Topics  . Smoking status: Former Smoker    Packs/day: 0.00    Types: Cigarettes    Quit date: 07/04/2015  . Smokeless tobacco: Former Neurosurgeon     Comment: Nov. 27 2016  . Alcohol use 2.4 oz/week    4 Cans of beer per week     Comment: occassional use     Allergies   Coconut fatty acids   Review of Systems Review of Systems  Unable to perform ROS: Acuity of condition     Physical Exam Updated Vital Signs There were no vitals taken for this visit.  Physical Exam  Constitutional: He appears well-developed and well-nourished. No  distress.  Sleepy but does awake easily to voice  HENT:  Head: Normocephalic and atraumatic.  Mouth/Throat: Oropharynx is clear and moist.  Eyes: Pupils are equal, round, and reactive to light. Conjunctivae and EOM are normal.  Neck: Normal range of motion. Neck supple.  Cardiovascular: Normal rate, regular rhythm and intact distal pulses.   No murmur heard. Pulmonary/Chest: Effort normal and breath sounds normal. No respiratory distress. He has no wheezes. He has no rales.  Abdominal: Soft. He exhibits no distension. There is no tenderness. There is no rebound and no guarding.  Musculoskeletal: Normal range of motion. He exhibits no edema or tenderness.  Neurological: A cranial nerve deficit and sensory deficit is present.  Patient has flaccid  paralysis of the right upper and lower extremity. Right-sided facial droop. Right hemi-neglect. Patient is not speaking at this time. He can follow commands and has 4 out of 5 strength in the left upper and lower extremity. Currently patient is protecting his airway.  Skin: Skin is warm and dry. No rash noted. No erythema.  Psychiatric: He has a normal mood and affect. His behavior is normal.  Nursing note and vitals reviewed.    ED Treatments / Results  Labs (all labs ordered are listed, but only abnormal results are displayed) Labs Reviewed  CBC - Abnormal; Notable for the following:       Result Value   WBC 11.9 (*)    All other components within normal limits  DIFFERENTIAL - Abnormal; Notable for the following:    Neutro Abs 10.5 (*)    All other components within normal limits  I-STAT CHEM 8, ED - Abnormal; Notable for the following:    Sodium 134 (*)    Chloride 100 (*)    BUN 22 (*)    Glucose, Bld 161 (*)    Calcium, Ion 1.05 (*)    All other components within normal limits  ETHANOL  PROTIME-INR  APTT  COMPREHENSIVE METABOLIC PANEL  RAPID URINE DRUG SCREEN, HOSP PERFORMED  URINALYSIS, ROUTINE W REFLEX MICROSCOPIC  I-STAT  TROPONIN, ED    EKG  EKG Interpretation  Date/Time:  Friday May 16 2017 15:44:25 EDT Ventricular Rate:  99 PR Interval:    QRS Duration: 107 QT Interval:  359 QTC Calculation: 461 R Axis:   71 Text Interpretation:  Sinus rhythm Low voltage, precordial leads No significant change since last tracing Confirmed by Gwyneth Sprout ([id number]) on 05/16/2017 3:48:18 PM       Radiology Ct Head Code Stroke Wo Contrast  Result Date: 05/16/2017 CLINICAL DATA:  Code stroke. Right-sided weakness. Last seen normal 13 hours ago. EXAM: CT HEAD WITHOUT CONTRAST TECHNIQUE: Contiguous axial images were obtained from the base of the skull through the vertex without intravenous contrast. COMPARISON:  None. FINDINGS: Brain: A large left MCA territory infarct is present with loss of gray-white differentiation some falls both super ganglionic in ganglionic level cortex. There is loss of density in the lentiform nucleus as well as portions of the left caudate. The left internal capsule is involved. The insular ribbon is involved. The infarct extends to the temporal tip. There is no hemorrhage. A hyperdense left MCA is present. Mass effect is present with partial effacement of the ventral CSF. There is no significant midline shift. The right hemisphere is unremarkable. The brainstem and cerebellum are normal. Vascular: Hyperdense left MCA. Skull: The calvarium is intact. No focal lytic or blastic lesions are present. Sinuses/Orbits: Mucosal thickening is present within the inferior right frontal sinus. A small polyp or mucous retention cyst is present inferiorly in the left maxillary sinus. The remaining paranasal sinuses and mastoid air cells are clear. ASPECTS [facility] Stroke Program Early CT Score) - Ganglionic level infarction (caudate, lentiform nuclei, internal capsule, insula, M1-M3 cortex): 0/7 - Supraganglionic infarction (M4-M6 cortex): 1/3 Total score (0-10 with 10 being normal): 1/10 IMPRESSION: 1. Large  left MCA nonhemorrhagic infarct with minimal sparing of super ganglionic cortex. 2. Mass effect with effacement of the sulci and partial effacement of the left lateral ventricle. 3. ASPECTS is 10/10 These results were called by telephone at the time of interpretation on 05/16/2017 at 3:28 pm to Dr. Milon Dikes , who verbally acknowledged these results. Electronically Signed  By: Marin Roberts M.D.   On: 05/16/2017 15:33    Procedures Procedures (including critical care time)  Medications Ordered in ED Medications  iopamidol (ISOVUE-370) 76 % injection (not administered)     Initial Impression / Assessment and Plan / ED Course  I have reviewed the triage vital signs and the nursing notes.  Pertinent labs & imaging results that were available during my care of the patient were reviewed by me and considered in my medical decision making (see chart for details).     Patient presenting as a code stroke last seen normal at 2 AM with positive Van score with neglect present. Patient has dense right-sided symptoms but currently is protecting his airway. He will wake up and follow commands. Neurology saw patient immediately upon arrival. Patient had imaging done which showed large MCA infarct. Patient also has mass effect with effacement of the sulci and  partial effacement of the left lateral ventricle. Unfortunately patient stroke is already completed and no evidence of penumbra area that would benefit from catheter directed lysis. Based on imaging studies patient was not felt to be a candidate for thrombin lysis. NIH of 27.  Patient will need to be monitored closely for evidence of decline in mental status. At this time does not require intubation. Will admit to ICU.  Neurosurgery Dr. Bevely Palmer to eval  CRITICAL CARE Performed by: Gwyneth Sprout Total critical care time: 30 minutes Critical care time was exclusive of separately billable procedures and treating other patients. Critical care  was necessary to treat or prevent imminent or life-threatening deterioration. Critical care was time spent personally by me on the following activities: development of treatment plan with patient and/or surrogate as well as nursing, discussions with consultants, evaluation of patient's response to treatment, examination of patient, obtaining history from patient or surrogate, ordering and performing treatments and interventions, ordering and review of laboratory studies, ordering and review of radiographic studies, pulse oximetry and re-evaluation of patient's condition.  Final Clinical Impressions(s) / ED Diagnoses   Final diagnoses:  Acute ischemic stroke [facility])    New Prescriptions New Prescriptions   No medications on file     Gwyneth Sprout, MD 05/16/17 1553

## 2017-05-17 ENCOUNTER — Inpatient Hospital Stay (HOSPITAL_COMMUNITY): Payer: Medicaid Other

## 2017-05-17 DIAGNOSIS — J988 Other specified respiratory disorders: Secondary | ICD-10-CM

## 2017-05-17 DIAGNOSIS — G936 Cerebral edema: Secondary | ICD-10-CM | POA: Diagnosis present

## 2017-05-17 DIAGNOSIS — I6789 Other cerebrovascular disease: Secondary | ICD-10-CM

## 2017-05-17 DIAGNOSIS — G935 Compression of brain: Secondary | ICD-10-CM | POA: Diagnosis present

## 2017-05-17 DIAGNOSIS — I63032 Cerebral infarction due to thrombosis of left carotid artery: Secondary | ICD-10-CM

## 2017-05-17 LAB — BASIC METABOLIC PANEL
Anion gap: 8 (ref 5–15)
Anion gap: 9 (ref 5–15)
BUN: 14 mg/dL (ref 6–20)
BUN: 18 mg/dL (ref 6–20)
CHLORIDE: 106 mmol/L (ref 101–111)
CHLORIDE: 108 mmol/L (ref 101–111)
CO2: 22 mmol/L (ref 22–32)
CO2: 23 mmol/L (ref 22–32)
Calcium: 8.3 mg/dL — ABNORMAL LOW (ref 8.9–10.3)
Calcium: 8.5 mg/dL — ABNORMAL LOW (ref 8.9–10.3)
Creatinine, Ser: 0.9 mg/dL (ref 0.61–1.24)
Creatinine, Ser: 0.95 mg/dL (ref 0.61–1.24)
GFR calc Af Amer: >60 mL/min (ref >60)
GFR calc Af Amer: >60 mL/min (ref >60)
GFR calc non Af Amer: >60 mL/min (ref >60)
GFR calc non Af Amer: >60 mL/min (ref >60)
Glucose, Bld: 123 mg/dL — ABNORMAL HIGH (ref 65–99)
Glucose, Bld: 136 mg/dL — ABNORMAL HIGH (ref 65–99)
POTASSIUM: 3.5 mmol/L (ref 3.5–5.1)
POTASSIUM: 3.7 mmol/L (ref 3.5–5.1)
SODIUM: 139 mmol/L (ref 135–145)
Sodium: 137 mmol/L (ref 135–145)

## 2017-05-17 LAB — GLUCOSE, CAPILLARY
Glucose-Capillary: 153 mg/dL — ABNORMAL HIGH (ref 65–99)
Glucose-Capillary: 138 mg/dL — ABNORMAL HIGH (ref 65–99)
Glucose-Capillary: 140 mg/dL — ABNORMAL HIGH (ref 65–99)
Glucose-Capillary: 107 mg/dL — ABNORMAL HIGH (ref 65–99)

## 2017-05-17 LAB — SODIUM
SODIUM: 145 mmol/L (ref 135–145)
Sodium: 140 mmol/L (ref 135–145)
Sodium: 146 mmol/L — ABNORMAL HIGH (ref 135–145)

## 2017-05-17 LAB — ECHOCARDIOGRAM COMPLETE
Height: 70 in
WEIGHTICAEL: 3456.81 [oz_av]

## 2017-05-17 LAB — PROTIME-INR
INR: 1
Prothrombin Time: 13.1 seconds (ref 11.4–15.2)

## 2017-05-17 LAB — LIPID PANEL
CHOL/HDL RATIO: 7.2 ratio
Cholesterol: 186 mg/dL (ref 0–200)
HDL: 26 mg/dL — AB (ref >40)
LDL Cholesterol: UNDETERMINED mg/dL (ref 0–99)
TRIGLYCERIDES: 532 mg/dL — AB (ref <150)
VLDL: UNDETERMINED mg/dL (ref 0–40)

## 2017-05-17 LAB — HIV ANTIBODY (ROUTINE TESTING W REFLEX): HIV Screen 4th Generation wRfx: NONREACTIVE

## 2017-05-17 LAB — HEMOGLOBIN A1C
HEMOGLOBIN A1C: 6.1 % — AB (ref 4.8–5.6)
MEAN PLASMA GLUCOSE: 128.37 mg/dL

## 2017-05-17 MED ORDER — ASPIRIN 300 MG RE SUPP: 300.0000 mg | Freq: Every day | RECTAL | Status: DC

## 2017-05-17 MED ORDER — ATORVASTATIN CALCIUM 80 MG PO TABS
80.0000 mg | ORAL_TABLET | Freq: Every day | ORAL | Status: DC
  Administered 2017-05-17 – 2017-06-03 (×11): 80 mg
  Filled 2017-05-17 (×11): qty 1

## 2017-05-17 MED ORDER — POTASSIUM CHLORIDE 10 MEQ/50ML IV SOLN
10.0000 meq | INTRAVENOUS | Status: AC
  Administered 2017-05-17 (×2): 10 meq via INTRAVENOUS
  Filled 2017-05-17 (×2): qty 50

## 2017-05-17 MED ORDER — FENTANYL BOLUS VIA INFUSION
50.0000 ug | INTRAVENOUS | Status: DC | PRN
  Filled 2017-05-17: qty 50

## 2017-05-17 MED ORDER — ASPIRIN 325 MG PO TABS
325.0000 mg | ORAL_TABLET | Freq: Every day | ORAL | Status: DC
  Administered 2017-05-17 – 2017-06-03 (×15): 325 mg
  Filled 2017-05-17 (×17): qty 1

## 2017-05-17 MED ORDER — PERFLUTREN LIPID MICROSPHERE
1.0000 mL | INTRAVENOUS | Status: AC | PRN
  Administered 2017-05-17: 7 mL via INTRAVENOUS
  Filled 2017-05-17: qty 10

## 2017-05-17 MED ORDER — MIDAZOLAM HCL 2 MG/2ML IJ SOLN: 2.0000 mg | INTRAMUSCULAR | Status: DC | PRN

## 2017-05-17 MED ORDER — FENTANYL 2500MCG IN NS 250ML (10MCG/ML) PREMIX INFUSION
25.0000 ug/h | INTRAVENOUS | Status: DC
  Administered 2017-05-17: 75 ug/h via INTRAVENOUS
  Filled 2017-05-17: qty 250

## 2017-05-17 MED ORDER — SODIUM CHLORIDE 23.4 % INJECTION (4 MEQ/ML) FOR IV ADMINISTRATION
30.0000 mL | Freq: Once | INTRAVENOUS | Status: AC
  Administered 2017-05-17: 30 mL via INTRAVENOUS
  Filled 2017-05-17: qty 30

## 2017-05-17 MED ORDER — MIDAZOLAM HCL 2 MG/2ML IJ SOLN
2.0000 mg | INTRAMUSCULAR | Status: DC | PRN
  Administered 2017-05-18: 2 mg via INTRAVENOUS
  Filled 2017-05-17 (×2): qty 2

## 2017-05-17 MED ORDER — FENTANYL CITRATE (PF) 100 MCG/2ML IJ SOLN
50.0000 ug | Freq: Once | INTRAMUSCULAR | Status: AC
  Administered 2017-05-17: 50 ug via INTRAVENOUS
  Filled 2017-05-17: qty 2

## 2017-05-17 NOTE — Progress Notes (Signed)
PULMONARY / CRITICAL CARE MEDICINE   Name: Henry David MRN: [id number] DOB: 06-Mar-1961    ADMISSION DATE:  05/16/2017   CHIEF COMPLAINT:  Found Down  HISTORY OF PRESENT ILLNESS:   56 year old with a history of CAD, MI  Found down 10/5. He was last seen normal at 2 AM. CT scan of the head with large area of MCA territory infarction. 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 UDS positive for cocaine and benzo. Intubated for airway protection.   SUBJECTIVE:  Intubated 10/5 for airway protection. Remains intubated an on propofol gtt.   VITAL SIGNS: BP 122/71   Pulse 85   Temp 98.9 F (37.2 C) (Axillary)   Resp 18   Ht  (1.778 m)   Wt 98 kg (216 lb 0.8 oz)   SpO2 99%   BMI 31.00 kg/m   HEMODYNAMICS:    VENTILATOR SETTINGS: Vent Mode: PRVC FiO2 (%):  [50 %-100 %] 50 % Set Rate:  [18 bmp] 18 bmp Vt Set:  [590 mL] 590 mL PEEP:  [5 cmH20] 5 cmH20 Plateau Pressure:  [13 cmH20-18 cmH20] 18 cmH20  INTAKE / OUTPUT: I/O last 3 completed shifts: In: 944.5 [I.V.:864.5; IV Piggyback:80] Out: 1350 [Urine:1250; Emesis/NG output:100]  PHYSICAL EXAMINATION: General:  Adult male, no distress  Neuro: Sedated, moves extremities RUE/RLE 3/5, LLE/LUE 5/5, does not follow commands  HEENT:  Normocephalic  Cardiovascular:  RRR, no MRG  Lungs: Clear breath sounds, non-labored, on vent, no wheeze/crackles  Abdomen:  Obese, active bowel sounds.  LABS:  BMET  Recent Labs Lab 05/16/17 1830 05/16/17 2338 05/17/17 0530 05/17/17 0540 05/17/17 1115  NA 134*  135 137 139 140 145  K 4.4 3.7 3.5  --   --   CL 101 106 108  --   --   CO2 --   --   BUN --   --   CREATININE 1.02 0.90 0.95  --   --   GLUCOSE 155* 123* 136*  --   --     Electrolytes  Recent Labs Lab 05/16/17 1830 05/16/17 2338 05/17/17 0530  CALCIUM 9.1 8.5* 8.3*    CBC  Recent Labs Lab 05/16/17 1537 05/16/17 1540  WBC 11.9*  --   HGB 13.7  13.9  HCT 39.8 41.0  PLT 265  --     Coag's  Recent Labs Lab 05/16/17 1537 05/17/17 0530  APTT 31  --   INR 1.11 1.00    Sepsis Markers No results for input(s): LATICACIDVEN, PROCALCITON, O2SATVEN in the last 168 hours.  ABG  Recent Labs Lab 05/16/17 1945  PHART 7.366  PCO2ART 41.1  PO2ART 383*    Liver Enzymes  Recent Labs Lab 05/16/17 1537  AST 32  ALT 36  ALKPHOS 87  BILITOT 0.6  ALBUMIN 3.7    Cardiac Enzymes No results for input(s): TROPONINI, PROBNP in the last 168 hours.  Glucose  Recent Labs Lab 05/16/17 2015 05/16/17 2334 05/17/17 0727 05/17/17 1125  GLUCAP 135* 108* 153* 138*    Imaging Ct Angio Head W Or Wo Contrast  Result Date: 05/16/2017 CLINICAL DATA:  Right-sided weakness. Last seen normal 13 hours ago. EXAM: CT ANGIOGRAPHY HEAD AND NECK TECHNIQUE: Multidetector CT imaging of the head and nCLIMMIE BUELOWrmed using the standard protocol during bolus administration of intravenous contrast. Multiplanar CT image reconstructions and MIPs were obtained to evaluate the vascular anatomy. Carotid stenosis measurements (  when applicable) are obtained utilizing NASCET criteria, using the distal internal carotid diameter as the denominator. CONTRAST:  100 mL Isovue 370 COMPARISON:  None. FINDINGS: CTA NECK FINDINGS Aortic arch: A 3 vessel arch configuration is present. There is no significant atherosclerotic calcification at the aortic arch. No focal stenosis is present. Right carotid system: The right common carotid artery is within normal limits. The right carotid bifurcation is normal. The cervical right ICA is unremarkable. Left carotid system: The left common carotid artery is within normal limits. Bifurcation is unremarkable. There is narrowing of the more distal left ICA without a focal transition. Vertebral arteries: Both vertebral arteries originate from the subclavian arteries without significant focal stenosis. The vertebral arteries are  codominant. There is no significant stenosis of either vessel within the neck. Skeleton: Vertebral body heights and alignment are normal. Endplate change in uncovertebral disease is most noted at C6-7 with right greater than left foraminal stenosis. No focal lytic or blastic lesions are present. Other neck: The soft tissues of the neck are otherwise unremarkable. Upper chest: Mild dependent atelectasis is present at the lung apices. There is no nodule or mass lesion. No significant airspace consolidation is present. The upper mediastinum is otherwise unremarkable. Review of the MIP images confirms the above findings CTA HEAD FINDINGS Anterior circulation: There is decreased caliber of the right internal carotid artery from the skull base to complete occlusion within the cavernous segment. There is retrograde filling of the left A1 segment. There there is no reconstitution of the ICA terminus or left MCA. Minimal atherosclerotic calcifications are present within the cavernous right internal carotid artery. Right ICA is normal. The right A1 and M1 segments are normal. There is an early bifurcation of the right MCA. The right A1 segment is patent. The anterior communicating artery is patent. The left A1 fills. The right MCA bifurcation is intact. Right MCA and bilateral ACA branch vessels are within normal limits. Limited collateral flow is present in the left hemisphere. Posterior circulation: The vertebral arteries are codominant. The vertebrobasilar junction is normal. The basilar artery is normal. Both posterior cerebral arteries originate from basilar tip. PCA branch vessels are within normal limits. Venous sinuses: The dural sinuses are patent. The right transverse sinus is dominant. The straight sinus and deep cerebral veins are intact. Anatomic variants: None. Delayed phase: Not performed Review of the MIP images confirms the above findings IMPRESSION: 1. 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 2. No significant collateral flow to the left MCA territory. 3. Patent anterior communicating artery with retrograde filling of the left A1 segment. 4. Minimal atherosclerotic calcification within the cavernous right internal carotid artery. 5. No other significant atherosclerotic disease within the head or neck. 6. Degenerative changes of the cervical spine are most pronounced at C6-7. These results were text paged at the time of interpretation on 05/16/2017 at 3:47 pm to Dr. Jerrell Belfast . Electronically Signed   By: Marin Roberts M.D.   On: 05/16/2017 15:55   Ct Head Wo Contrast  Result Date: 05/17/2017 CLINICAL DATA:  Altered level of consciousness. EXAM: CT HEAD WITHOUT CONTRAST TECHNIQUE: Contiguous axial images were obtained from the base of the skull through the vertex without intravenous contrast. COMPARISON:  05/16/2017 FINDINGS: Brain: As seen on the previous study, there is a large area of low-attenuation with loss of gray-white matter differentiation and sulcal effacement involving most of the left frontal, parietal, and temporal lobes. This  is consistent with an acute infarct in the distribution of the left middle cerebral artery. Since the previous study, there is increasing mass effect with increased effacement of the left lateral ventricle and with about 4 mm left-to-right midline shift. No developing acute intracranial hemorrhage. Evaluation is limited due to motion artifact. Vascular: Hyperdense appearance of the left middle cerebral artery consistent with thrombus. Skull: No depressed skull fractures. Sinuses/Orbits: Paranasal sinuses and mastoid air cells are clear. Other: None. IMPRESSION: Large acute infarct in the distribution of the left middle cerebral artery. Since the previous study, there is increasing mass effect with increased effacement of the left lateral ventricle. Mild developing left-to-right midline shift of about 4  mm. No acute intracranial hemorrhage. Electronically Signed   By: Burman Nieves M.D.   On: 05/17/2017 03:58   Ct Angio Neck W Or Wo Contrast  Result Date: 05/16/2017 CLINICAL DATA:  Right-sided weakness. Last seen normal 13 hours ago. EXAM: CT ANGIOGRAPHY HEAD AND NECK TECHNIQUE: Multidetector CT imaging of the head and neck was performed using the standard protocol during bolus administration of intravenous contrast. Multiplanar CT image reconstructions and MIPs were obtained to evaluate the vascular anatomy. Carotid stenosis measurements (when applicable) are obtained utilizing NASCET criteria, using the distal internal carotid diameter as the denominator. CONTRAST:  100 mL Isovue 370 COMPARISON:  None. FINDINGS: CTA NECK FINDINGS Aortic arch: A 3 vessel arch configuration is present. There is no significant atherosclerotic calcification at the aortic arch. No focal stenosis is present. Right carotid system: The right common carotid artery is within normal limits. The right carotid bifurcation is normal. The cervical right ICA is unremarkable. Left carotid system: The left common carotid artery is within normal limits. Bifurcation is unremarkable. There is narrowing of the more distal left ICA without a focal transition. Vertebral arteries: Both vertebral arteries originate from the subclavian arteries without significant focal stenosis. The vertebral arteries are codominant. There is no significant stenosis of either vessel within the neck. Skeleton: Vertebral body heights and alignment are normal. Endplate change in uncovertebral disease is most noted at C6-7 with right greater than left foraminal stenosis. No focal lytic or blastic lesions are present. Other neck: The soft tissues of the neck are otherwise unremarkable. Upper chest: Mild dependent atelectasis is present at the lung apices. There is no nodule or mass lesion. No significant airspace consolidation is present. The upper mediastinum is  otherwise unremarkable. Review of the MIP images confirms the above findings CTA HEAD FINDINGS Anterior circulation: There is decreased caliber of the right internal carotid artery from the skull base to complete occlusion within the cavernous segment. There is retrograde filling of the left A1 segment. There there is no reconstitution of the ICA terminus or left MCA. Minimal atherosclerotic calcifications are present within the cavernous right internal carotid artery. Right ICA is normal. The right A1 and M1 segments are normal. There is an early bifurcation of the right MCA. The right A1 segment is patent. The anterior communicating artery is patent. The left A1 fills. The right MCA bifurcation is intact. Right MCA and bilateral ACA branch vessels are within normal limits. Limited collateral flow is present in the left hemisphere. Posterior circulation: The vertebral arteries are codominant. The vertebrobasilar junction is normal. The basilar artery is normal. Both posterior cerebral arteries originate from basilar tip. PCA branch vessels are within normal limits. Venous sinuses: The dural sinuses are patent. The right transverse sinus is dominant. The straight sinus and deep cerebral veins are intact.  Anatomic variants: None. Delayed phase: Not performed Review of the MIP images confirms the above findings IMPRESSION: 1. 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 2. No significant collateral flow to the left MCA territory. 3. Patent anterior communicating artery with retrograde filling of the left A1 segment. 4. Minimal atherosclerotic calcification within the cavernous right internal carotid artery. 5. No other significant atherosclerotic disease within the head or neck. 6. Degenerative changes of the cervical spine are most pronounced at C6-7. These results were text paged at the time of interpretation on 05/16/2017 at 3:47 pm to Dr.  Jerrell Belfast . Electronically Signed   By: Marin Roberts M.D.   On: 05/16/2017 15:55   Ct Cerebral Perfusion W Contrast  Result Date: 05/16/2017 CLINICAL DATA:  Left MCA territory infarct. Last seen normal 13 hours ago. EXAM: CT PERFUSION BRAIN TECHNIQUE: Multiphase CT imaging of the brain was performed following IV bolus contrast injection. Subsequent parametric perfusion maps were calculated using RAPID software. CONTRAST:  100 mL Isovue 370 COMPARISON:  CT head without contrast FINDINGS: CT Brain Perfusion Findings: CBF (<30%) Volume: Perfusion (Tmax>6.0s) volume: Mismatch Volume: 44mL Infarction Location:Left MCA territory IMPRESSION: 1. Large left MCA territory nonhemorrhagic infarct neck exceeds size criteria for intra-arterial intervention. Electronically Signed   By: Marin Roberts M.D.   On: 05/16/2017 15:56   Dg Chest Port 1 View  Result Date: 05/16/2017 CLINICAL DATA:  Line placement. EXAM: PORTABLE CHEST 1 VIEW COMPARISON:  Two-view chest x-ray 12/29/2015 FINDINGS: The heart is normal in size.  Lung volumes are low. Endotracheal tube is been placed, terminating 2.5 cm above the carina. NG tube courses off the inferior border of the film. A right subclavian line is in place. The tip is in the mid SVC. There is no pneumothorax. No significant edema or effusion is present. The visualized soft tissues and bony thorax are otherwise unremarkable. IMPRESSION: 1. Low lung volumes. 2. Satisfactory positioning of support apparatus as above. Electronically Signed   By: Marin Roberts M.D.   On: 05/16/2017 18:54   Ct Head Code Stroke Wo Contrast  Result Date: 05/16/2017 CLINICAL DATA:  Code stroke. Right-sided weakness. Last seen normal 13 hours ago. EXAM: CT HEAD WITHOUT CONTRAST TECHNIQUE: Contiguous axial images were obtained from the base of the skull through the vertex without intravenous contrast. COMPARISON:  None. FINDINGS: Brain: A large left MCA territory infarct is  present with loss of gray-white differentiation some falls both super ganglionic in ganglionic level cortex. There is loss of density in the lentiform nucleus as well as portions of the left caudate. The left internal capsule is involved. The insular ribbon is involved. The infarct extends to the temporal tip. There is no hemorrhage. A hyperdense left MCA is present. Mass effect is present with partial effacement of the ventral CSF. There is no significant midline shift. The right hemisphere is unremarkable. The brainstem and cerebellum are normal. Vascular: Hyperdense left MCA. Skull: The calvarium is intact. No focal lytic or blastic lesions are present. Sinuses/Orbits: Mucosal thickening is present within the inferior right frontal sinus. A small polyp or mucous retention cyst is present inferiorly in the left maxillary sinus. The remaining paranasal sinuses and mastoid air cells are clear. ASPECTS [facility] Stroke Program Early CT Score) - Ganglionic level infarction (caudate, lentiform nuclei, internal capsule, insula, M1-M3 cortex): 0/7 - Supraganglionic infarction (M4-M6 cortex): 1/3 Total score (0-10 with 10 being normal): 1/10 IMPRESSION: 1. Large  left MCA nonhemorrhagic infarct with minimal sparing of super ganglionic cortex. 2. Mass effect with effacement of the sulci and partial effacement of the left lateral ventricle. 3. ASPECTS is 10/10 These results were called by telephone at the time of interpretation on 05/16/2017 at 3:28 pm to Dr. Milon Dikes , who verbally acknowledged these results. Electronically Signed   By: Marin Roberts M.D.   On: 05/16/2017 15:33    STUDIES:  CT Head 10/5 > Large left MCA nonhemorrhagic infarct with minimal sparing of super ganglionic cortex. Mass effect with effacement of the sulci and partial effacement of the left lateral ventricle. CTA Head 10/5 > 1. 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 2. No significant collateral flow to the left MCA territory. 3. Patent anterior communicating artery with retrograde filling of the left A1 segment. 4. Minimal atherosclerotic calcification within the cavernous right internal carotid artery. 5. No other significant atherosclerotic disease within the head or neck. 6. Degenerative changes of the cervical spine are most pronounced at C6-7. CXR 10/5 > The heart is normal in size.  Lung volumes are low. Endotracheal tube is been placed, terminating 2.5 cm above the carina. NG tube courses off the inferior border of the film. A right subclavian line is in place. The tip is in the mid SVC. There is no pneumothorax. No significant edema or effusion is present. The visualized soft tissues and bony thorax are otherwise unremarkable  CULTURES: MRSA PCR 10/5 > Negative   ANTIBIOTICS: None.   SIGNIFICANT EVENTS: 10/5 > Presents to ED with large Left MCA CVA   LINES/TUBES: ETT 10/5 >>  Right Subclavian CVC 10/5 >>  DISCUSSION: 56 year old male found down. CT with large left MCA CVA. Intubated for airway protection   ASSESSMENT / PLAN:  PULMONARY A: Respiratory Insufficiency in setting of CVA  P:   Vent Support Wean as tolerated > will not extubate at this time due to extending CVA VAP Bundle  Trend CXR/ABG   CARDIOVASCULAR A:  H/O HTN, HLD, CAD, MI  Past Apical Mural Thrombus  P:  Cardiac Monitoring  Maintain Systolic <220 PRN Labetalol  ECHO pending  ASA Restart Lipitor   RENAL A:   Hypokalemia  P:   Trend BMP Serial NA (on 3% as below)   GASTROINTESTINAL A:   Nutrition Needs  P:   NPO PPI  Start TF 10/7 if still intubated   HEMATOLOGIC A:   Risk for Hemorraghic Conversion  P:  Trend CBC   INFECTIOUS A:   No issues  P:   Trend WBC and fever curve   ENDOCRINE A:   Hyperglycemia    P:   Trend Glucose  SSI   NEUROLOGIC A:   Left MCA CVA with increasing mass effect with 4 mm  left-right midline shift  Polysubstance Abuse (+UDS Cocaine/Benzo) H/O Depression, Anxiety  P:   RASS goal 0/-1 D/C Propofol due to increase Triglycerides, Start Fentanyl gtt and PRN versed.  Neurology and Neurosurgery Following  Repeat Head CT pending  3% per Neurology    FAMILY  - Updates: no family at bedside   - Inter-disciplinary family meet or Palliative Care meeting due by: 05/23/2017   CC Time: 34 minutes   Jovita Kussmaul, AGACNP-BC [facility]  Pgr: [phone]  PCCM Pgr: [phone]

## 2017-05-17 NOTE — Progress Notes (Signed)
STROKE TEAM PROGRESS NOTE   HISTORY OF PRESENT ILLNESS (per record) Henry David is a 56 y.o. male was a past medical history of anxiety, depression, hypertension, hyperlipidemia and coronary artery disease, last seen normal at 2 AM by his roommate, and did not wake up this evening. Upon checking on him, he had gaze to the left, was not talking and was unable to move the right side of his body. EMS was called and they brought him to the hospital emergency room here at [facility] as an acute code stroke. Patient is unable to provide any further history. There is no other family member attendant at the bedside to provide history. I examined the patient as he was rolled in as an acute code stroke in the emergency room bridge. Initial exam was consistent with a complete left MCA syndrome. 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 He was outside the TPA window on presentation  LKW: 0200 hrs. On 05/16/2017 tpa given?: no, outside the window And CT scan showing large MCA infarct Premorbid modified Rankin scale (mRS):  0     SUBJECTIVE (INTERVAL HISTORY) No family members present. The patient is intubated and sedated.Blood pressure has been controlled. Neurological he remains unresponsive but is able to localize on the left. Repeat CT scan of the head this morning shows progressive cytotoxic edema with 4 mm left-to-right midline shift. Serum sodium is yet below goal on 75 mL of hypertonic saline   OBJECTIVE Temp:  [98.7 F (37.1 C)-100.8 F (38.2 C)] 100 F (37.8 C) (10/06 0500) Pulse Rate:  [87-106] 96 (10/06 0731) Cardiac Rhythm: Normal sinus rhythm (10/05 2000) Resp:  [15-26] 20 (10/06 0731) BP: (109-157)/(64-87) 131/81 (10/06 0731) SpO2:  [91 %-100 %] 97 %  (10/06 0731) FiO2 (%):  [60 %-100 %] 60 % (10/06 0731) Weight:  [216 lb 0.8 oz (98 kg)] 216 lb 0.8 oz (98 kg) (10/05 1900)  CBC:   Recent Labs Lab 05/16/17 1537 05/16/17 1540  WBC 11.9*  --   NEUTROABS 10.5*  --   HGB 13.7 13.9  HCT 39.8 41.0  MCV 83.8  --   PLT 265  --     Basic Metabolic Panel:   Recent Labs Lab 05/16/17 2338 05/17/17 0530 05/17/17 0540  NA 137 139 140  K 3.7 3.5  --   CL 106 108  --   CO2 23 22  --   GLUCOSE 123* 136*  --   BUN 18 14  --   CREATININE 0.90 0.95  --   CALCIUM 8.5* 8.3*  --     Lipid Panel:     Component Value Date/Time   CHOL 186 05/17/2017 0500   CHOL 114 12/12/2016 1118   TRIG 532 (H) 05/17/2017 0500   HDL 26 (L) 05/17/2017 0500   HDL 27 (L) 12/12/2016 1118   CHOLHDL 7.2 05/17/2017 0500   VLDL UNABLE TO CALCULATE IF TRIGLYCERIDE OVER 400 mg/dL 40/98/1191 4782   LDLCALC UNABLE TO CALCULATE IF TRIGLYCERIDE OVER 400 mg/dL 95/62/1308 6578   LDLCALC 40 12/12/2016 1118   HgbA1c:  Lab Results  Component Value Date   HGBA1C 6.1 (H) 05/17/2017   Urine Drug Screen:     Component Value Date/Time   LABOPIA NONE DETECTED 05/16/2017 1902  COCAINSCRNUR POSITIVE (A) 05/16/2017 1902   LABBENZ POSITIVE (A) 05/16/2017 1902   AMPHETMU NONE DETECTED 05/16/2017 1902   THCU NONE DETECTED 05/16/2017 1902   LABBARB NONE DETECTED 05/16/2017 1902    Alcohol Level     Component Value Date/Time   ETH <10 05/16/2017 1535     IMAGING   Ct Angio Head W Or Wo Contrast Ct Angio Neck W Or Wo Contrast 05/16/2017 IMPRESSION:  1. 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  2. No significant collateral flow to the left MCA territory.  3. Patent anterior communicating artery with retrograde filling of the left A1 segment.  4. Minimal atherosclerotic calcification within the cavernous right internal carotid artery.  5. No other significant atherosclerotic disease  within the head or neck.  6. Degenerative changes of the cervical spine are most pronounced at C6-7.    Ct Cerebral Perfusion W Contrast 05/16/2017 IMPRESSION:  Large left MCA territory nonhemorrhagic infarct neck exceeds size criteria for intra-arterial intervention.    Ct Head Code Stroke Wo Contrast 05/16/2017 IMPRESSION:  1. Large left MCA nonhemorrhagic infarct with minimal sparing of super ganglionic cortex.  2. Mass effect with effacement of the sulci and partial effacement of the left lateral ventricle.  3. ASPECTS is 10/10    Ct Head Wo Contrast 05/17/2017 IMPRESSION:  Large acute infarct in the distribution of the left middle cerebral artery.  Since the previous study, there is increasing mass effect with increased effacement of the left lateral ventricle.  Mild developing left-to-right midline shift of about 4 mm.  No acute intracranial hemorrhage.      Dg Chest Port 1 View 05/16/2017 IMPRESSION:  1. Low lung volumes.  2. Satisfactory positioning of support apparatus as above.     Transthoracic Echocardiogram - pending          PHYSICAL EXAM Vitals:   05/17/17 0500 05/17/17 0600 05/17/17 0700 05/17/17 0731  BP: 116/74 109/67 133/74 131/81  Pulse: (!) 105 (!) 101 (!) 102 96  Resp: 15 18 18 20   Temp: 100 F (37.8 C)     TempSrc: Axillary     SpO2: 96% 97% 96% 97%  Weight:      Height:       Obese middle-age Caucasian male was intubated and sedated. . Afebrile. Head is nontraumatic. Neck is supple without bruit.    Cardiac exam no murmur or gallop. Lungs are clear to auscultation. Distal pulses are well felt.  Neurological Exam : Patient is sedated and intubated. Does not follow any commands. Eyes are closed. Pupils 4 mm right briskly reactive and left is sluggish. Fundi not visualized. Mild vertical skew deviation the left eye hypotropia. Grimaces to pain. Has cough and gag. Response to sternal rub with localization in the left hand and this moment  in the left leg. Withdraws right lower extremity slightly to painful stimuli. Trace movement in the right upper extremity to deep pain only. Reflexes diminished on the right normal on the left. Right plantar upgoing left downgoing.   ASSESSMENT/PLAN [name] is a 56 y.o. male with history of anxiety, depression, history of apical mural thrombus, hypertension, hyperlipidemia and coronary artery disease, presenting with aphasia and right sided weakness. He did not receive IV t-PA due to late presentation.  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  Resultant  global aphasia and right hemiplegia  CT head - Large acute infarct  in the distribution of the left middle cerebral artery. Mild developing left-to-right midline shift of about 4 mm.   MRI head - not performed.  MRA head - not performed  Carotid Doppler - CTA neck  2D Echo - pending  LDL - 26  HgbA1c - 6.1  VTE prophylaxis Diet NPO time specified  aspirin 81 mg daily and clopidogrel 75 mg daily prior to admission, now on  Rectal aspirini  Ongoing aggressive stroke risk factor management  Therapy recommendations:  - pending  Disposition:  Pending  Hypertension  Stable  Permissive hypertension (OK if < 220/120) but gradually normalize in 5-7 days  Long-term BP goal normotensive  Hyperlipidemia  Home meds:  Lipitor 80 mg daily - not resumed in hospital  LDL 26, goal < 70  Resume Lipitor once PO access is available  Continue statin at discharge  Diabetes  HgbA1c 6.1, goal < 7.0  No previous history of diabetes  Cerebral Edema  Hypertonic saline - central line - increased to 85 cc's per hour  30 cc bolus 23.4% saline given today.  Sodium - 140  Neuro Surgery following  Repeat head CT in AM   Other Stroke Risk Factors  Former cigarette smoker - quit  ETOH use, will be advised to drink no more than 1 drink per day.  Obesity, Body mass index is 31 kg/m., recommend  weight loss, diet and exercise as appropriate   Coronary artery disease  Cocaine use  History of apical mural thrombus - await echo results    Other Active Problems  UDS - positive for cocaine and benzodiazapines  Hyperglycemia  Mild leukocytosis - axillary temp 100 - repeat CBC in AM  Left ICA occlusion  Hypokalemia -> treated    Hospital day # 1  Delton See PA-C [facility] Pager [phone] 05/17/2017, 11:29 AM I have personally examined this patient, reviewed notes, independently viewed imaging studies, participated in medical decision making and plan of care.ROS completed by me personally and pertinent positives fully documented  I have made any additions or clarifications directly to the above note.  He has presented with a large left middle cerebral artery infarct and is not developing cytotoxic cerebral edema and is likely at risk for brain herniation and that. He is intubated and on hypertonic saline. Neurosurgery has been consulted but feel he is not a hemicolectomy candidate at the present time. Recommend  30 cc bolus of 23.4% saline and increase 3% saline drip rate to 85 mL an hour. If patient has further chemical neurological decline may need emergent hemicraniectomy. Discussed with Dr. Bevely Palmer neurosurgeon who agrees. Discussed with Dr. Wallace Cullens. Pulmonary critical care physician. No family or friends available at the bedside. Repeat CT head in the morning. Keep normothermic and euglycemic. This patient is critically ill and at significant risk of neurological worsening, death and care requires constant monitoring of vital signs, hemodynamics,respiratory and cardiac monitoring, extensive review of multiple databases, frequent neurological assessment, discussion with family, other specialists and medical decision making of high complexity.I have made any additions or clarifications directly to the above note.This critical care time does not reflect procedure  time, or teaching time or supervisory time of PA/NP/Med Resident etc but could involve care discussion time.  I spent 50 minutes of neurocritical care time  in the care of  this patient.      Delia Heady, MD Medical Director [facility] Pager: [phone] 05/17/2017 12:08 PM  To contact Stroke Continuity provider, please  refer to [email or web address] After hours, contact General Neurology

## 2017-05-17 NOTE — Progress Notes (Signed)
SLP Cancellation Note  Patient Details Name: Henry David MRN: [id number] DOB: [birth year]   Cancelled treatment:       Reason Eval/Treat Not Completed: Medical issues which prohibited therapy; Pt currently intubated, SLP will monitor for extubation and readiness for BSE and SLE   Marcene Duos MA, CCC-SLP Acute Care Speech Language Pathologist    Kennieth Rad 05/17/2017, 8:48 AM

## 2017-05-17 NOTE — Consult Note (Signed)
Chief Complaint   Chief Complaint  Patient presents with  . Code Stroke    HPI   HPI: Henry David is a 56 y.o. male who was brought to ER after roommate found him altered in the afternoon. Last seen normal 0200 yesterday. History limited to chart review. CT shows large left MCA territory infarct.  Patient Active Problem List   Diagnosis Date Noted  . CVA (cerebral vascular accident) ([place]) 05/16/2017  . 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 Long term (current) use of anticoagulants [Z79.01] 01/03/2016  . Chest pain 12/29/2015  . Apical mural thrombus   . Pain in the chest   . Essential hypertension   . BPH (benign prostatic hyperplasia)   . Lower urinary tract symptoms 10/04/2015  . Obesity 10/04/2015  . Cardiomyopathy, ischemic 07/20/2015  . Prediabetes 07/13/2015  . Presence of drug coated stent in right coronary artery 07/12/2015  . 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 Hyperlipidemia with target LDL less than 70 07/10/2015  . CAD S/P LAD and RCA DES 07/10/2015  . Presence of drug coated stent in LAD coronary artery   . STEMI 07/09/15   . UPPER RESPIRATORY INFECTION (URI) 06/30/2009  . LACERATION-FINGER 06/30/2009  . ERECTILE DYSFUNCTION 06/22/2008  . ACTINIC KERATOSIS 09/23/2007  . Depression with anxiety 06/03/2007  . INFECTION, URINARY TRACT NOS 06/03/2007  . SHOULDER PAIN 06/03/2007  . 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 GERD 03/07/2007  . 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 DIVERTICULITIS, HX OF 03/07/2007    PMH: Past Medical History:  Diagnosis Date  . Anxiety   . Apical mural thrombus   . BPH (benign prostatic hyperplasia)   . Depression   . Diverticulitis   . Elevated cholesterol   . Essential hypertension   . Post traumatic stress disorder   . STEMI (ST elevation myocardial infarction) ([place])     PSH: Past Surgical History:  Procedure Laterality Date  . CARDIAC CATHETERIZATION N/A 07/09/2015   Procedure: Left  Heart Cath and Coronary Angiography;  Surgeon: Troy Sine, MD;  Location: [facility];  Service: Cardiovascular;  Laterality: N/A;  . CARDIAC CATHETERIZATION N/A 07/09/2015   Procedure: Coronary Stent Intervention;  Surgeon: Troy Sine, MD;  Location: [facility];  Service: Cardiovascular;  Laterality: N/A;  . CARDIAC CATHETERIZATION N/A 07/11/2015   Procedure: Coronary Stent Intervention;  Surgeon: Troy Sine, MD;  Location: [facility];  Service: Cardiovascular;  Laterality: N/A;  . KNEE SURGERY      Prescriptions Prior to Admission  Medication Sig Dispense Refill Last Dose  . acetaminophen (TYLENOL) 500 MG tablet Take 500 mg by mouth every 6 (six) hours as needed.   Taking  . aspirin 81 MG EC tablet Take 1 tablet (81 mg total) by mouth daily. 30 tablet 0 Taking  . atorvastatin (LIPITOR) 80 MG tablet TAKE 1 TABLET BY MOUTH DAILY AT 6 PM. 30 tablet 3   . buPROPion (WELLBUTRIN SR) 150 MG 12 hr tablet TAKE ONE TABLET BY MOUTH TWICE DAILY 60 tablet 3 Taking  . cetirizine (ZYRTEC) 10 MG tablet TAKE 1 TABLET BY MOUTH DAILY. 30 tablet 3 Taking  . clopidogrel (PLAVIX) 75 MG tablet TAKE 1 TABLET BY MOUTH DAILY. 90 tablet 0 Taking  . cyclobenzaprine (FLEXERIL) 10 MG tablet TAKE 1 TABLET BY MOUTH 2 TIMES DAILY AS NEEDED FOR MUSCLE SPASMS. 60 tablet 1   . famotidine (PEPCID) 20 MG tablet Take 1 tablet (20 mg  total) by mouth 2 (two) times daily. 60 tablet 3   . fluticasone (FLONASE) 50 MCG/ACT nasal spray PLACE 2 SPRAYS INTO BOTH NOSTRILS DAILY. 16 g 3   . hydrOXYzine (ATARAX/VISTARIL) 25 MG tablet Take 1 tablet (25 mg total) by mouth 3 (three) times daily as needed. 60 tablet 3   . lisinopril (PRINIVIL,ZESTRIL) 2.5 MG tablet TAKE 1 TABLET BY MOUTH AT BEDTIME. 30 tablet 3   . metoprolol tartrate (LOPRESSOR) 25 MG tablet TAKE 1 TABLET BY MOUTH 2 TIMES DAILY. 60 tablet 3 Taking  . NITROSTAT 0.4 MG SL tablet PLACE 1 TABLET UNDER THE TONGUE EVERY 5 MINUTES AS NEEDED FOR CHEST  PAIN. MAX-3 DOSES WITHIN A 15 MINUTE INTERVAL 25 tablet 0   . olopatadine (PATANOL) 0.1 % ophthalmic solution PLACE 1 DROP INTO BOTH EYES 2 TIMES DAILY. 15 mL 0   . Omega-3 Fatty Acids (FISH OIL) 1000 MG CAPS Take 2 capsules by mouth 2 (two) times daily.   Taking  . sertraline (ZOLOFT) 100 MG tablet Take 1 tablet (100 mg total) by mouth daily. 30 tablet 3   . tamsulosin (FLOMAX) 0.4 MG CAPS capsule TAKE 1 CAPSULE BY MOUTH DAILY. 30 capsule 3     SH: Social History  Substance Use Topics  . Smoking status: Former Smoker    Packs/day: 0.00    Types: Cigarettes    Quit date: 07/04/2015  . Smokeless tobacco: Former Systems developer     Comment: Nov. 27 2016  . Alcohol use 2.4 oz/week    4 Cans of beer per week     Comment: occassional use    MEDS: Prior to Admission medications   Medication Sig Start Date End Date Taking? Authorizing Provider  acetaminophen (TYLENOL) 500 MG tablet Take 500 mg by mouth every 6 (six) hours as needed.    [provider]  aspirin 81 MG EC tablet Take 1 tablet (81 mg total) by mouth daily. 01/22/16   Arnoldo Morale, MD  atorvastatin (LIPITOR) 80 MG tablet TAKE 1 TABLET BY MOUTH DAILY AT 6 PM. 03/20/17   Arnoldo Morale, MD  buPROPion (WELLBUTRIN SR) 150 MG 12 hr tablet TAKE ONE TABLET BY MOUTH TWICE DAILY 03/14/17   Arnoldo Morale, MD  cetirizine (ZYRTEC) 10 MG tablet TAKE 1 TABLET BY MOUTH DAILY. 03/11/17   Arnoldo Morale, MD  clopidogrel (PLAVIX) 75 MG tablet TAKE 1 TABLET BY MOUTH DAILY. 03/11/17   Arnoldo Morale, MD  cyclobenzaprine (FLEXERIL) 10 MG tablet TAKE 1 TABLET BY MOUTH 2 TIMES DAILY AS NEEDED FOR MUSCLE SPASMS. 04/16/17   Arnoldo Morale, MD  famotidine (PEPCID) 20 MG tablet Take 1 tablet (20 mg total) by mouth 2 (two) times daily. 03/20/17   Arnoldo Morale, MD  fluticasone (FLONASE) 50 MCG/ACT nasal spray PLACE 2 SPRAYS INTO BOTH NOSTRILS DAILY. 03/20/17   Arnoldo Morale, MD  hydrOXYzine (ATARAX/VISTARIL) 25 MG tablet Take 1 tablet (25 mg total) by mouth 3 (three) times  daily as needed. 04/24/17   Arnoldo Morale, MD  lisinopril (PRINIVIL,ZESTRIL) 2.5 MG tablet TAKE 1 TABLET BY MOUTH AT BEDTIME. 04/15/17   Arnoldo Morale, MD  metoprolol tartrate (LOPRESSOR) 25 MG tablet TAKE 1 TABLET BY MOUTH 2 TIMES DAILY. 03/14/17   Arnoldo Morale, MD  NITROSTAT 0.4 MG SL tablet PLACE 1 TABLET UNDER THE TONGUE EVERY 5 MINUTES AS NEEDED FOR CHEST PAIN. MAX-3 DOSES WITHIN A 15 MINUTE INTERVAL 05/13/17   Arnoldo Morale, MD  olopatadine (PATANOL) 0.1 % ophthalmic solution PLACE 1 DROP INTO BOTH EYES 2  TIMES DAILY. 04/15/17   Arnoldo Morale, MD  Omega-3 Fatty Acids (FISH OIL) 1000 MG CAPS Take 2 capsules by mouth 2 (two) times daily.    [provider]  sertraline (ZOLOFT) 100 MG tablet Take 1 tablet (100 mg total) by mouth daily. 03/20/17   Arnoldo Morale, MD  tamsulosin (FLOMAX) 0.4 MG CAPS capsule TAKE 1 CAPSULE BY MOUTH DAILY. 04/15/17   Arnoldo Morale, MD    ALLERGY: Allergies  Allergen Reactions  . Coconut Fatty Acids     unknown    Social History  Substance Use Topics  . Smoking status: Former Smoker    Packs/day: 0.00    Types: Cigarettes    Quit date: 07/04/2015  . Smokeless tobacco: Former Systems developer     Comment: Nov. 27 2016  . Alcohol use 2.4 oz/week    4 Cans of beer per week     Comment: occassional use     Family History  Problem Relation Age of Onset  . Heart disease Mother   . Heart disease Father      ROS   ROS unable to obtain  Exam   Vitals:   05/17/17 0731 05/17/17 0800  BP: 131/81 (!) 149/82  Pulse: 96 97  Resp: 20 15  Temp:  (!) 100.5 F (38.1 C)  SpO2: 97% 97%   Intubated Squeezes left hand on command Will raise left arm at elbow slightly on command Will flex BUE/BLE extremities to pain Intermittent opens eyes per nursing PEERL  Results - Imaging/Labs   Results for orders placed or performed during the hospital encounter of 05/16/17 (from the past 48 hour(s))  Ethanol     Status: None   Collection Time: 05/16/17  3:35 PM  Result  Value Ref Range   Alcohol, Ethyl (B) <10 <10 mg/dL    Comment:        LOWEST DETECTABLE LIMIT FOR SERUM ALCOHOL IS 10 mg/dL FOR MEDICAL PURPOSES ONLY Please note change in reference range.   Protime-INR     Status: None   Collection Time: 05/16/17  3:37 PM  Result Value Ref Range   Prothrombin Time 14.2 11.4 - 15.2 seconds   INR 1.11   APTT     Status: None   Collection Time: 05/16/17  3:37 PM  Result Value Ref Range   aPTT 31 24 - 36 seconds  CBC     Status: Abnormal   Collection Time: 05/16/17  3:37 PM  Result Value Ref Range   WBC 11.9 (H) 4.0 - 10.5 K/uL   RBC 4.75 4.22 - 5.81 MIL/uL   Hemoglobin 13.7 13.0 - 17.0 g/dL   HCT 39.8 39.0 - 52.0 %   MCV 83.8 78.0 - 100.0 fL   MCH 28.8 26.0 - 34.0 pg   MCHC 34.4 30.0 - 36.0 g/dL   RDW 12.8 11.5 - 15.5 %   Platelets 265 150 - 400 K/uL  Differential     Status: Abnormal   Collection Time: 05/16/17  3:37 PM  Result Value Ref Range   Neutrophils Relative % 89 %   Neutro Abs 10.5 (H) 1.7 - 7.7 K/uL   Lymphocytes Relative 6 %   Lymphs Abs 0.7 0.7 - 4.0 K/uL   Monocytes Relative 5 %   Monocytes Absolute 0.6 0.1 - 1.0 K/uL   Eosinophils Relative 0 %   Eosinophils Absolute 0.0 0.0 - 0.7 K/uL   Basophils Relative 0 %   Basophils Absolute 0.0 0.0 - 0.1 K/uL  Comprehensive metabolic  panel     Status: Abnormal   Collection Time: 05/16/17  3:37 PM  Result Value Ref Range   Sodium 131 (L) 135 - 145 mmol/L   Potassium 4.6 3.5 - 5.1 mmol/L   Chloride 102 101 - 111 mmol/L   CO2 21 (L) 22 - 32 mmol/L   Glucose, Bld 165 (H) 65 - 99 mg/dL   BUN 20 6 - 20 mg/dL   Creatinine, Ser 6.07 0.61 - 1.24 mg/dL   Calcium 8.4 (L) 8.9 - 10.3 mg/dL   Total Protein 7.0 6.5 - 8.1 g/dL   Albumin 3.7 3.5 - 5.0 g/dL   AST 32 15 - 41 U/L   ALT 36 17 - 63 U/L   Alkaline Phosphatase 87 38 - 126 U/L   Total Bilirubin 0.6 0.3 - 1.2 mg/dL   GFR calc non Af Amer >60 >60 mL/min   GFR calc Af Amer >60 >60 mL/min    Comment: (NOTE) The eGFR has been  calculated using the CKD EPI equation. This calculation has not been validated in all clinical situations. eGFR's persistently <60 mL/min signify possible Chronic Kidney Disease.    Anion gap 8 5 - 15  I-stat troponin, ED     Status: None   Collection Time: 05/16/17  3:38 PM  Result Value Ref Range   Troponin i, poc 0.00 0.00 - 0.08 ng/mL   Comment 3            Comment: Due to the release kinetics of cTnI, a negative result within the first hours of the onset of symptoms does not rule out myocardial infarction with certainty. If myocardial infarction is still suspected, repeat the test at appropriate intervals.   I-Stat Chem 8, ED     Status: Abnormal   Collection Time: 05/16/17  3:40 PM  Result Value Ref Range   Sodium 134 (L) 135 - 145 mmol/L   Potassium 4.6 3.5 - 5.1 mmol/L   Chloride 100 (L) 101 - 111 mmol/L   BUN 22 (H) 6 - 20 mg/dL   Creatinine, Ser 2.05 0.61 - 1.24 mg/dL   Glucose, Bld 883 (H) 65 - 99 mg/dL   Calcium, Ion 8.59 (L) 1.15 - 1.40 mmol/L   TCO2 23 22 - 32 mmol/L   Hemoglobin 13.9 13.0 - 17.0 g/dL   HCT 88.7 55.6 - 78.8 %  HIV antibody (Routine Testing)     Status: None   Collection Time: 05/16/17  4:33 PM  Result Value Ref Range   HIV Screen 4th Generation wRfx Non Reactive Non Reactive    Comment: (NOTE) Performed At: [facility] [address] [place], [place] [id number] Mila Homer MD [id number]   MRSA PCR Screening     Status: None   Collection Time: 05/16/17  5:15 PM  Result Value Ref Range   MRSA by PCR NEGATIVE NEGATIVE    Comment:        The GeneXpert MRSA Assay (FDA approved for NASAL specimens only), is one component of a comprehensive MRSA colonization surveillance program. It is not intended to diagnose MRSA infection nor to guide or monitor treatment for MRSA infections.   Basic metabolic panel     Status: Abnormal   Collection Time: 05/16/17  6:30 PM  Result Value Ref Range   Sodium 135 135 - 145 mmol/L    Potassium 4.4 3.5 - 5.1 mmol/L   Chloride 101 101 - 111 mmol/L   CO2 25 22 - 32 mmol/L   Glucose,  Bld 155 (H) 65 - 99 mg/dL   BUN 18 6 - 20 mg/dL   Creatinine, Ser 1.02 0.61 - 1.24 mg/dL   Calcium 9.1 8.9 - 10.3 mg/dL   GFR calc non Af Amer >60 >60 mL/min   GFR calc Af Amer >60 >60 mL/min    Comment: (NOTE) The eGFR has been calculated using the CKD EPI equation. This calculation has not been validated in all clinical situations. eGFR's persistently <60 mL/min signify possible Chronic Kidney Disease.    Anion gap 9 5 - 15  Triglycerides     Status: Abnormal   Collection Time: 05/16/17  6:30 PM  Result Value Ref Range   Triglycerides 263 (H) <150 mg/dL  Sodium     Status: Abnormal   Collection Time: 05/16/17  6:30 PM  Result Value Ref Range   Sodium 134 (L) 135 - 145 mmol/L  Urine rapid drug screen (hosp performed)     Status: Abnormal   Collection Time: 05/16/17  7:02 PM  Result Value Ref Range   Opiates NONE DETECTED NONE DETECTED   Cocaine POSITIVE (A) NONE DETECTED   Benzodiazepines POSITIVE (A) NONE DETECTED   Amphetamines NONE DETECTED NONE DETECTED   Tetrahydrocannabinol NONE DETECTED NONE DETECTED   Barbiturates NONE DETECTED NONE DETECTED    Comment:        DRUG SCREEN FOR MEDICAL PURPOSES ONLY.  IF CONFIRMATION IS NEEDED FOR ANY PURPOSE, NOTIFY LAB WITHIN 5 DAYS.        LOWEST DETECTABLE LIMITS FOR URINE DRUG SCREEN Drug Class       Cutoff (ng/mL) Amphetamine      1000 Barbiturate      200 Benzodiazepine   412 Tricyclics       878 Opiates          300 Cocaine          300 THC              50   Urinalysis, Routine w reflex microscopic     Status: Abnormal   Collection Time: 05/16/17  7:03 PM  Result Value Ref Range   Color, Urine YELLOW YELLOW   APPearance CLEAR CLEAR   Specific Gravity, Urine >1.046 (H) 1.005 - 1.030   pH 5.0 5.0 - 8.0   Glucose, UA NEGATIVE NEGATIVE mg/dL   Hgb urine dipstick LARGE (A) NEGATIVE   Bilirubin Urine NEGATIVE NEGATIVE    Ketones, ur NEGATIVE NEGATIVE mg/dL   Protein, ur 30 (A) NEGATIVE mg/dL   Nitrite NEGATIVE NEGATIVE   Leukocytes, UA NEGATIVE NEGATIVE   RBC / HPF 0-5 0 - 5 RBC/hpf   WBC, UA 0-5 0 - 5 WBC/hpf   Bacteria, UA RARE (A) NONE SEEN   Squamous Epithelial / LPF NONE SEEN NONE SEEN  Blood gas, arterial     Status: Abnormal   Collection Time: 05/16/17  7:45 PM  Result Value Ref Range   FIO2 100.00    Delivery systems VENTILATOR    Mode PRESSURE REGULATED VOLUME CONTROL    VT 590 mL   LHR 18 resp/min   Peep/cpap 5.0 cm H20   pH, Arterial 7.366 7.350 - 7.450   pCO2 arterial 41.1 32.0 - 48.0 mmHg   pO2, Arterial 383 (H) 83.0 - 108.0 mmHg   Bicarbonate 22.8 20.0 - 28.0 mmol/L   Acid-base deficit 1.7 0.0 - 2.0 mmol/L   O2 Saturation 99.4 %   Patient temperature 99.6    Collection site LEFT RADIAL    Drawn  by [id number]    Sample type ARTERIAL DRAW    Allens test (pass/fail) PASS PASS  Glucose, capillary     Status: Abnormal   Collection Time: 05/16/17  8:15 PM  Result Value Ref Range   Glucose-Capillary 135 (H) 65 - 99 mg/dL  Glucose, capillary     Status: Abnormal   Collection Time: 05/16/17 11:34 PM  Result Value Ref Range   Glucose-Capillary 108 (H) 65 - 99 mg/dL  Basic metabolic panel     Status: Abnormal   Collection Time: 05/16/17 11:38 PM  Result Value Ref Range   Sodium 137 135 - 145 mmol/L   Potassium 3.7 3.5 - 5.1 mmol/L   Chloride 106 101 - 111 mmol/L   CO2 23 22 - 32 mmol/L   Glucose, Bld 123 (H) 65 - 99 mg/dL   BUN 18 6 - 20 mg/dL   Creatinine, Ser 0.90 0.61 - 1.24 mg/dL   Calcium 8.5 (L) 8.9 - 10.3 mg/dL   GFR calc non Af Amer >60 >60 mL/min   GFR calc Af Amer >60 >60 mL/min    Comment: (NOTE) The eGFR has been calculated using the CKD EPI equation. This calculation has not been validated in all clinical situations. eGFR's persistently <60 mL/min signify possible Chronic Kidney Disease.    Anion gap 8 5 - 15  Hemoglobin A1c     Status: Abnormal   Collection  Time: 05/17/17  5:00 AM  Result Value Ref Range   Hgb A1c MFr Bld 6.1 (H) 4.8 - 5.6 %    Comment: (NOTE) Pre diabetes:          5.7%-6.4% Diabetes:              >6.4% Glycemic control for   <7.0% adults with diabetes    Mean Plasma Glucose 128.37 mg/dL  Lipid panel     Status: Abnormal   Collection Time: 05/17/17  5:00 AM  Result Value Ref Range   Cholesterol 186 0 - 200 mg/dL   Triglycerides 532 (H) <150 mg/dL   HDL 26 (L) >40 mg/dL   Total CHOL/HDL Ratio 7.2 RATIO   VLDL UNABLE TO CALCULATE IF TRIGLYCERIDE OVER 400 mg/dL 0 - 40 mg/dL   LDL Cholesterol UNABLE TO CALCULATE IF TRIGLYCERIDE OVER 400 mg/dL 0 - 99 mg/dL    Comment:        Total Cholesterol/HDL:CHD Risk Coronary Heart Disease Risk Table                     Men   Women  1/2 Average Risk   3.4   3.3  Average Risk       5.0   4.4  2 X Average Risk   9.6   7.1  3 X Average Risk  23.4   11.0        Use the calculated Patient Ratio above and the CHD Risk Table to determine the patient's CHD Risk.        ATP III CLASSIFICATION (LDL):  <100     mg/dL   Optimal  100-129  mg/dL   Near or Above                    Optimal  130-159  mg/dL   Borderline  160-189  mg/dL   High  >190     mg/dL   Very High   Basic metabolic panel     Status: Abnormal   Collection Time: 05/17/17  5:30  AM  Result Value Ref Range   Sodium 139 135 - 145 mmol/L   Potassium 3.5 3.5 - 5.1 mmol/L   Chloride 108 101 - 111 mmol/L   CO2 22 22 - 32 mmol/L   Glucose, Bld 136 (H) 65 - 99 mg/dL   BUN 14 6 - 20 mg/dL   Creatinine, Ser 0.95 0.61 - 1.24 mg/dL   Calcium 8.3 (L) 8.9 - 10.3 mg/dL   GFR calc non Af Amer >60 >60 mL/min   GFR calc Af Amer >60 >60 mL/min    Comment: (NOTE) The eGFR has been calculated using the CKD EPI equation. This calculation has not been validated in all clinical situations. eGFR's persistently <60 mL/min signify possible Chronic Kidney Disease.    Anion gap 9 5 - 15  Protime-INR     Status: None   Collection Time:  05/17/17  5:30 AM  Result Value Ref Range   Prothrombin Time 13.1 11.4 - 15.2 seconds   INR 1.00   Sodium     Status: None   Collection Time: 05/17/17  5:40 AM  Result Value Ref Range   Sodium 140 135 - 145 mmol/L  Glucose, capillary     Status: Abnormal   Collection Time: 05/17/17  7:27 AM  Result Value Ref Range   Glucose-Capillary 153 (H) 65 - 99 mg/dL    Ct Angio Head W Or Wo Contrast  Result Date: 05/16/2017 CLINICAL DATA:  Right-sided weakness. Last seen normal 13 hours ago. EXAM: CT ANGIOGRAPHY HEAD AND NECK TECHNIQUE: Multidetector CT imaging of the head and neck was performed using the standard protocol during bolus administration of intravenous contrast. Multiplanar CT image reconstructions and MIPs were obtained to evaluate the vascular anatomy. Carotid stenosis measurements (when applicable) are obtained utilizing NASCET criteria, using the distal internal carotid diameter as the denominator. CONTRAST:  100 mL Isovue 370 COMPARISON:  None. FINDINGS: CTA NECK FINDINGS Aortic arch: A 3 vessel arch configuration is present. There is no significant atherosclerotic calcification at the aortic arch. No focal stenosis is present. Right carotid system: The right common carotid artery is within normal limits. The right carotid bifurcation is normal. The cervical right ICA is unremarkable. Left carotid system: The left common carotid artery is within normal limits. Bifurcation is unremarkable. There is narrowing of the more distal left ICA without a focal transition. Vertebral arteries: Both vertebral arteries originate from the subclavian arteries without significant focal stenosis. The vertebral arteries are codominant. There is no significant stenosis of either vessel within the neck. Skeleton: Vertebral body heights and alignment are normal. Endplate change in uncovertebral disease is most noted at C6-7 with right greater than left foraminal stenosis. No focal lytic or blastic lesions are  present. Other neck: The soft tissues of the neck are otherwise unremarkable. Upper chest: Mild dependent atelectasis is present at the lung apices. There is no nodule or mass lesion. No significant airspace consolidation is present. The upper mediastinum is otherwise unremarkable. Review of the MIP images confirms the above findings CTA HEAD FINDINGS Anterior circulation: There is decreased caliber of the right internal carotid artery from the skull base to complete occlusion within the cavernous segment. There is retrograde filling of the left A1 segment. There there is no reconstitution of the ICA terminus or left MCA. Minimal atherosclerotic calcifications are present within the cavernous right internal carotid artery. Right ICA is normal. The right A1 and M1 segments are normal. There is an early bifurcation of the right MCA. The  right A1 segment is patent. The anterior communicating artery is patent. The left A1 fills. The right MCA bifurcation is intact. Right MCA and bilateral ACA branch vessels are within normal limits. Limited collateral flow is present in the left hemisphere. Posterior circulation: The vertebral arteries are codominant. The vertebrobasilar junction is normal. The basilar artery is normal. Both posterior cerebral arteries originate from basilar tip. PCA branch vessels are within normal limits. Venous sinuses: The dural sinuses are patent. The right transverse sinus is dominant. The straight sinus and deep cerebral veins are intact. Anatomic variants: None. Delayed phase: Not performed Review of the MIP images confirms the above findings IMPRESSION: 1. 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 2. No significant collateral flow to the left MCA territory. 3. Patent anterior communicating artery with retrograde filling of the left A1 segment. 4. Minimal atherosclerotic calcification within the cavernous right internal  carotid artery. 5. No other significant atherosclerotic disease within the head or neck. 6. Degenerative changes of the cervical spine are most pronounced at C6-7. These results were text paged at the time of interpretation on 05/16/2017 at 3:47 pm to Dr. Malen Gauze . Electronically Signed   By: San Morelle M.D.   On: 05/16/2017 15:55   Ct Head Wo Contrast  Result Date: 05/17/2017 CLINICAL DATA:  Altered level of consciousness. EXAM: CT HEAD WITHOUT CONTRAST TECHNIQUE: Contiguous axial images were obtained from the base of the skull through the vertex without intravenous contrast. COMPARISON:  05/16/2017 FINDINGS: Brain: As seen on the previous study, there is a large area of low-attenuation with loss of gray-white matter differentiation and sulcal effacement involving most of the left frontal, parietal, and temporal lobes. This is consistent with an acute infarct in the distribution of the left middle cerebral artery. Since the previous study, there is increasing mass effect with increased effacement of the left lateral ventricle and with about 4 mm left-to-right midline shift. No developing acute intracranial hemorrhage. Evaluation is limited due to motion artifact. Vascular: Hyperdense appearance of the left middle cerebral artery consistent with thrombus. Skull: No depressed skull fractures. Sinuses/Orbits: Paranasal sinuses and mastoid air cells are clear. Other: None. IMPRESSION: Large acute infarct in the distribution of the left middle cerebral artery. Since the previous study, there is increasing mass effect with increased effacement of the left lateral ventricle. Mild developing left-to-right midline shift of about 4 mm. No acute intracranial hemorrhage. Electronically Signed   By: Lucienne Capers M.D.   On: 05/17/2017 03:58   Ct Angio Neck W Or Wo Contrast  Result Date: 05/16/2017 CLINICAL DATA:  Right-sided weakness. Last seen normal 13 hours ago. EXAM: CT ANGIOGRAPHY HEAD AND NECK  TECHNIQUE: Multidetector CT imaging of the head and neck was performed using the standard protocol during bolus administration of intravenous contrast. Multiplanar CT image reconstructions and MIPs were obtained to evaluate the vascular anatomy. Carotid stenosis measurements (when applicable) are obtained utilizing NASCET criteria, using the distal internal carotid diameter as the denominator. CONTRAST:  100 mL Isovue 370 COMPARISON:  None. FINDINGS: CTA NECK FINDINGS Aortic arch: A 3 vessel arch configuration is present. There is no significant atherosclerotic calcification at the aortic arch. No focal stenosis is present. Right carotid system: The right common carotid artery is within normal limits. The right carotid bifurcation is normal. The cervical right ICA is unremarkable. Left carotid system: The left common carotid artery is within normal limits. Bifurcation is unremarkable. There is narrowing  of the more distal left ICA without a focal transition. Vertebral arteries: Both vertebral arteries originate from the subclavian arteries without significant focal stenosis. The vertebral arteries are codominant. There is no significant stenosis of either vessel within the neck. Skeleton: Vertebral body heights and alignment are normal. Endplate change in uncovertebral disease is most noted at C6-7 with right greater than left foraminal stenosis. No focal lytic or blastic lesions are present. Other neck: The soft tissues of the neck are otherwise unremarkable. Upper chest: Mild dependent atelectasis is present at the lung apices. There is no nodule or mass lesion. No significant airspace consolidation is present. The upper mediastinum is otherwise unremarkable. Review of the MIP images confirms the above findings CTA HEAD FINDINGS Anterior circulation: There is decreased caliber of the right internal carotid artery from the skull base to complete occlusion within the cavernous segment. There is retrograde filling of  the left A1 segment. There there is no reconstitution of the ICA terminus or left MCA. Minimal atherosclerotic calcifications are present within the cavernous right internal carotid artery. Right ICA is normal. The right A1 and M1 segments are normal. There is an early bifurcation of the right MCA. The right A1 segment is patent. The anterior communicating artery is patent. The left A1 fills. The right MCA bifurcation is intact. Right MCA and bilateral ACA branch vessels are within normal limits. Limited collateral flow is present in the left hemisphere. Posterior circulation: The vertebral arteries are codominant. The vertebrobasilar junction is normal. The basilar artery is normal. Both posterior cerebral arteries originate from basilar tip. PCA branch vessels are within normal limits. Venous sinuses: The dural sinuses are patent. The right transverse sinus is dominant. The straight sinus and deep cerebral veins are intact. Anatomic variants: None. Delayed phase: Not performed Review of the MIP images confirms the above findings IMPRESSION: 1. 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 2. No significant collateral flow to the left MCA territory. 3. Patent anterior communicating artery with retrograde filling of the left A1 segment. 4. Minimal atherosclerotic calcification within the cavernous right internal carotid artery. 5. No other significant atherosclerotic disease within the head or neck. 6. Degenerative changes of the cervical spine are most pronounced at C6-7. These results were text paged at the time of interpretation on 05/16/2017 at 3:47 pm to Dr. Malen Gauze . Electronically Signed   By: San Morelle M.D.   On: 05/16/2017 15:55   Ct Cerebral Perfusion W Contrast  Result Date: 05/16/2017 CLINICAL DATA:  Left MCA territory infarct. Last seen normal 13 hours ago. EXAM: CT PERFUSION BRAIN TECHNIQUE: Multiphase CT imaging of the  brain was performed following IV bolus contrast injection. Subsequent parametric perfusion maps were calculated using RAPID software. CONTRAST:  100 mL Isovue 370 COMPARISON:  CT head without contrast FINDINGS: CT Brain Perfusion Findings: CBF (<30%) Volume: 139m Perfusion (Tmax>6.0s) volume: 2241mMismatch Volume: 4455mnfarction Location:Left MCA territory IMPRESSION: 1. Large left MCA territory nonhemorrhagic infarct neck exceeds size criteria for intra-arterial intervention. Electronically Signed   By: ChrSan MorelleD.   On: 05/16/2017 15:56   Dg Chest Port 1 View  Result Date: 05/16/2017 CLINICAL DATA:  Line placement. EXAM: PORTABLE CHEST 1 VIEW COMPARISON:  Two-view chest x-ray 12/29/2015 FINDINGS: The heart is normal in size.  Lung volumes are low. Endotracheal tube is been placed, terminating 2.5 cm above the carina. NG tube courses off the inferior border of the film. A  right subclavian line is in place. The tip is in the mid SVC. There is no pneumothorax. No significant edema or effusion is present. The visualized soft tissues and bony thorax are otherwise unremarkable. IMPRESSION: 1. Low lung volumes. 2. Satisfactory positioning of support apparatus as above. Electronically Signed   By: San Morelle M.D.   On: 05/16/2017 18:54   Ct Head Code Stroke Wo Contrast  Result Date: 05/16/2017 CLINICAL DATA:  Code stroke. Right-sided weakness. Last seen normal 13 hours ago. EXAM: CT HEAD WITHOUT CONTRAST TECHNIQUE: Contiguous axial images were obtained from the base of the skull through the vertex without intravenous contrast. COMPARISON:  None. FINDINGS: Brain: A large left MCA territory infarct is present with loss of gray-white differentiation some falls both super ganglionic in ganglionic level cortex. There is loss of density in the lentiform nucleus as well as portions of the left caudate. The left internal capsule is involved. The insular ribbon is involved. The infarct extends to  the temporal tip. There is no hemorrhage. A hyperdense left MCA is present. Mass effect is present with partial effacement of the ventral CSF. There is no significant midline shift. The right hemisphere is unremarkable. The brainstem and cerebellum are normal. Vascular: Hyperdense left MCA. Skull: The calvarium is intact. No focal lytic or blastic lesions are present. Sinuses/Orbits: Mucosal thickening is present within the inferior right frontal sinus. A small polyp or mucous retention cyst is present inferiorly in the left maxillary sinus. The remaining paranasal sinuses and mastoid air cells are clear. ASPECTS [facility] Stroke Program Early CT Score) - Ganglionic level infarction (caudate, lentiform nuclei, internal capsule, insula, M1-M3 cortex): 0/7 - Supraganglionic infarction (M4-M6 cortex): 1/3 Total score (0-10 with 10 being normal): 1/10 IMPRESSION: 1. Large left MCA nonhemorrhagic infarct with minimal sparing of super ganglionic cortex. 2. Mass effect with effacement of the sulci and partial effacement of the left lateral ventricle. 3. ASPECTS is 10/10 These results were called by telephone at the time of interpretation on 05/16/2017 at 3:28 pm to Dr. Amie Portland , who verbally acknowledged these results. Electronically Signed   By: San Morelle M.D.   On: 05/16/2017 15:33    Impression/Plan   56 y.o. male with large left MCA nonhemorrhagic infarct. Neuro deficits noted above. Reviewed case with Dr Cyndy Freeze who has also reviewed the imaging.  Infarct progressed as expected, but no need for hemi-craniectomy. Continue to monitor. If infarct continues to progress, may need to consider surgical intervention.

## 2017-05-17 NOTE — Progress Notes (Signed)
PT Cancellation Note  Patient Details Name: Henry David MRN: [id number] DOB: Nov 06, 1960   Cancelled Treatment:    Reason Eval/Treat Not Completed: Patient not medically ready   Fabio Asa 05/17/2017, 8:06 AM Charlotte Crumb, PT DPT  Board Certified Neurologic Specialist [phone]

## 2017-05-17 NOTE — Progress Notes (Signed)
[facility] ADULT ICU REPLACEMENT PROTOCOL FOR AM LAB REPLACEMENT ONLY  The patient does apply for the [facility] Adult ICU Electrolyte Replacment Protocol based on the criteria listed below:   1. Is GFR >/= 40 ml/min? Yes.    Patient's GFR today is >60 2. Is urine output >/= 0.5 ml/kg/hr for the last 6 hours? Yes.   Patient's UOP is 1.19 ml/kg/hr 3. Is BUN < 60 mg/dL? Yes.    Patient's BUN today is 14 4. Abnormal electrolyte(s): 3,5 5. Ordered repletion with: per protocol 6. If a panic level lab has been reported, has the CCM MD in charge been notified? Yes.  .   Physician:  Dr. Juline Patch, Dixon Boos 05/17/2017 6:24 AM

## 2017-05-17 NOTE — Progress Notes (Signed)
Initial Nutrition Assessment  DOCUMENTATION CODES:  Obesity unspecified  INTERVENTION:  If unable to extubate within 24 hrs and medically able, recommend TF.   Initiate TF via OGT with Vital High Protein at goal rate of 15 ml/h (360 ml per day) and Prostat 60 ml qid to provide 1160 kcals (+156 kcals from diprivan), 152 gm protein, 125 ml free water daily.  NUTRITION DIAGNOSIS:  Inadequate oral intake related to inability to eat as evidenced by NPO status.  GOAL:  Provide needs based on ASPEN/SCCM guidelines  MONITOR:  Diet advancement, Vent status, Labs, Weight trends, I & O's  REASON FOR ASSESSMENT:  Ventilator    ASSESSMENT:  56 y/o male PMHx CAD, Anxiety, Depression, HTN, PTSD, MI. Roommate found patient unresponsive. Arrived via EMS with R hemiplegia. Worked up for large, evolving MCA infarct. Intubated for airway protection.   Per RN, extubation not anticipated today. Likely to begin TF tomorrow. TF recommendations will be left.   No family/historians at bedside.   Per chart, patient was weighing 225-235 lbs for approximately the past year. Appears to have lost a small, insignificant amount of weight recently.   Noted TG- >500, per RN, Propofol has been drastically titrated down. Had been at 80 mcg. Now 10   Physical Exam: Obese, abdomen nondistended  Patient is currently intubated on ventilator support MV: 14.4 L/min Temp (24hrs), Avg:99.7 F (37.6 C), Min:98.7 F (37.1 C), Max:100.8 F (38.2 C)  Propofol: 5.9 ml/hr= 156 kcals/day  Labs: Bgs x 24 110-155, TG 532 , Na trending up Meds: Insulin, Senna, Hypertonic Saline, Propofol, KCL, IV famotidine,    Recent Labs Lab 05/16/17 1830 05/16/17 2338 05/17/17 0530 05/17/17 0540 05/17/17 1115  NA 134*  135 137 139 140 145  K 4.4 3.7 3.5  --   --   CL 101 106 108  --   --   CO2 25 23 22   --   --   BUN 18 18 14   --   --   CREATININE 1.02 0.90 0.95  --   --   CALCIUM 9.1 8.5* 8.3*  --   --   GLUCOSE 155*  123* 136*  --   --    Diet Order:  Diet NPO time specified  Skin: Abrasions to R/L legs  Last BM:  Unknown  Height:  Ht Readings from Last 1 Encounters:  05/16/17 5\' 10"  (1.778 m)   Weight:  Wt Readings from Last 1 Encounters:  05/16/17 216 lb 0.8 oz (98 kg)   Wt Readings from Last 10 Encounters:  05/16/17 216 lb 0.8 oz (98 kg)  03/20/17 225 lb (102.1 kg)  12/31/16 227 lb (103 kg)  12/12/16 228 lb (103.4 kg)  08/27/16 234 lb 12.8 oz (106.5 kg)  07/24/16 238 lb 6.4 oz (108.1 kg)  06/25/16 233 lb (105.7 kg)  06/14/16 238 lb (108 kg)  05/08/16 233 lb 6.4 oz (105.9 kg)  03/08/16 226 lb 3.2 oz (102.6 kg)   Ideal Body Weight:  75.45 kg  BMI:  Body mass index is 31 kg/m.  Estimated Nutritional Needs:  Kcal:  1080-1370 kcals (11-14 kcal/kg bw) Protein: >151 Fluid:  Per MD  EDUCATION NEEDS:  No education needs identified at this time  Christophe Louis RD, LDN, CNSC Clinical Nutrition Pager: [phone] 05/17/2017 12:24 PM

## 2017-05-17 NOTE — Progress Notes (Signed)
  Echocardiogram 2D Echocardiogram with definity has been performed.  Henry David M 05/17/2017, 10:19 AM

## 2017-05-17 NOTE — Progress Notes (Signed)
OT Cancellation Note  Patient Details Name: Henry David MRN: [id number] DOB: [birth year]   Cancelled Treatment:    Reason Eval/Treat Not Completed: Patient not medically ready.   Gaye Alken M.S., OTR/L Pager: [phone]  05/17/2017, 8:11 AM

## 2017-05-18 ENCOUNTER — Inpatient Hospital Stay (HOSPITAL_COMMUNITY): Payer: Medicaid Other

## 2017-05-18 DIAGNOSIS — G935 Compression of brain: Secondary | ICD-10-CM

## 2017-05-18 DIAGNOSIS — I639 Cerebral infarction, unspecified: Secondary | ICD-10-CM

## 2017-05-18 LAB — CBC WITH DIFFERENTIAL/PLATELET
BASOS ABS: 0 10*3/uL (ref 0.0–0.1)
Basophils Relative: 0 %
EOS PCT: 0 %
Eosinophils Absolute: 0 10*3/uL (ref 0.0–0.7)
HEMATOCRIT: 35.6 % — AB (ref 39.0–52.0)
Hemoglobin: 11.5 g/dL — ABNORMAL LOW (ref 13.0–17.0)
LYMPHS ABS: 1.2 10*3/uL (ref 0.7–4.0)
LYMPHS PCT: 11 %
MCH: 28 pg (ref 26.0–34.0)
MCHC: 32.3 g/dL (ref 30.0–36.0)
MCV: 86.8 fL (ref 78.0–100.0)
MONO ABS: 0.8 10*3/uL (ref 0.1–1.0)
Monocytes Relative: 7 %
NEUTROS ABS: 9.2 10*3/uL — AB (ref 1.7–7.7)
Neutrophils Relative %: 82 %
Platelets: 226 10*3/uL (ref 150–400)
RBC: 4.1 MIL/uL — AB (ref 4.22–5.81)
RDW: 13.9 % (ref 11.5–15.5)
WBC: 11.2 10*3/uL — ABNORMAL HIGH (ref 4.0–10.5)

## 2017-05-18 LAB — GLUCOSE, CAPILLARY
GLUCOSE-CAPILLARY: 114 mg/dL — AB (ref 65–99)
GLUCOSE-CAPILLARY: 111 mg/dL — AB (ref 65–99)
GLUCOSE-CAPILLARY: 110 mg/dL — AB (ref 65–99)
Glucose-Capillary: 124 mg/dL — ABNORMAL HIGH (ref 65–99)

## 2017-05-18 LAB — SODIUM
SODIUM: 149 mmol/L — AB (ref 135–145)
SODIUM: 150 mmol/L — AB (ref 135–145)
Sodium: 152 mmol/L — ABNORMAL HIGH (ref 135–145)
Sodium: 155 mmol/L — ABNORMAL HIGH (ref 135–145)

## 2017-05-18 LAB — BASIC METABOLIC PANEL
ANION GAP: 7 (ref 5–15)
BUN: 11 mg/dL (ref 6–20)
CO2: 22 mmol/L (ref 22–32)
Calcium: 8 mg/dL — ABNORMAL LOW (ref 8.9–10.3)
Chloride: 121 mmol/L — ABNORMAL HIGH (ref 101–111)
Creatinine, Ser: 0.78 mg/dL (ref 0.61–1.24)
GFR calc Af Amer: >60 mL/min (ref >60)
GFR calc non Af Amer: >60 mL/min (ref >60)
GLUCOSE: 151 mg/dL — AB (ref 65–99)
POTASSIUM: 3.1 mmol/L — AB (ref 3.5–5.1)
Sodium: 150 mmol/L — ABNORMAL HIGH (ref 135–145)

## 2017-05-18 LAB — MAGNESIUM: Magnesium: 2.2 mg/dL (ref 1.7–2.4)

## 2017-05-18 LAB — PHOSPHORUS: Phosphorus: 1.6 mg/dL — ABNORMAL LOW (ref 2.5–4.6)

## 2017-05-18 MED ORDER — CHLORHEXIDINE GLUCONATE CLOTH 2 % EX PADS
6.0000 | MEDICATED_PAD | Freq: Every day | CUTANEOUS | Status: DC
  Administered 2017-05-19 – 2017-05-23 (×5): 6 via TOPICAL

## 2017-05-18 MED ORDER — CLOPIDOGREL 45 MG/30 ML ORAL SUSPENSION
75.0000 mg | Freq: Every day | ORAL | Status: DC
  Filled 2017-05-18: qty 60

## 2017-05-18 MED ORDER — SODIUM CHLORIDE 0.9% FLUSH
10.0000 mL | Freq: Two times a day (BID) | INTRAVENOUS | Status: DC
  Administered 2017-05-18: 10 mL
  Administered 2017-05-19: 30 mL
  Administered 2017-05-19: 10 mL
  Administered 2017-05-20: 20 mL
  Administered 2017-05-21 – 2017-05-24 (×7): 10 mL

## 2017-05-18 MED ORDER — VITAL HIGH PROTEIN PO LIQD
1000.0000 mL | ORAL | Status: DC
  Administered 2017-05-18 – 2017-05-20 (×2): 1000 mL

## 2017-05-18 MED ORDER — POTASSIUM CHLORIDE 10 MEQ/50ML IV SOLN
10.0000 meq | INTRAVENOUS | Status: DC
  Administered 2017-05-18 (×2): 10 meq via INTRAVENOUS
  Filled 2017-05-18 (×4): qty 50

## 2017-05-18 MED ORDER — CLOPIDOGREL BISULFATE 75 MG PO TABS
75.0000 mg | ORAL_TABLET | Freq: Every day | ORAL | Status: DC
  Administered 2017-05-19 – 2017-06-03 (×12): 75 mg
  Filled 2017-05-18 (×14): qty 1

## 2017-05-18 MED ORDER — CHLORHEXIDINE GLUCONATE CLOTH 2 % EX PADS: 6.0000 | MEDICATED_PAD | Freq: Every day | CUTANEOUS | Status: DC

## 2017-05-18 MED ORDER — SODIUM CHLORIDE 0.9% FLUSH: 10.0000 mL | INTRAVENOUS | Status: DC | PRN

## 2017-05-18 MED ORDER — POTASSIUM CHLORIDE 20 MEQ/15ML (10%) PO SOLN
20.0000 meq | Freq: Once | ORAL | Status: AC
  Administered 2017-05-18: 20 meq via ORAL
  Filled 2017-05-18: qty 15

## 2017-05-18 MED ORDER — PANTOPRAZOLE SODIUM 40 MG PO PACK
40.0000 mg | PACK | ORAL | Status: DC
  Administered 2017-05-18 – 2017-05-19 (×2): 40 mg
  Filled 2017-05-18 (×2): qty 20

## 2017-05-18 MED ORDER — PRO-STAT SUGAR FREE PO LIQD
60.0000 mL | Freq: Four times a day (QID) | ORAL | Status: DC
  Administered 2017-05-18 – 2017-05-19 (×6): 60 mL
  Filled 2017-05-18 (×6): qty 60

## 2017-05-18 NOTE — Progress Notes (Signed)
PT Cancellation Note  Patient Details Name: Henry David MRN: [id number] DOB: Oct 12, 1960   Cancelled Treatment:     Patient remains not medically ready for evaluation at this time, will follow.   Fabio Asa 05/18/2017, 8:36 AM

## 2017-05-18 NOTE — Progress Notes (Signed)
PULMONARY / CRITICAL CARE MEDICINE   Name: Henry David MRN: [id number] DOB: [birth year]    ADMISSION DATE:  05/16/2017   CHIEF COMPLAINT:  Found Down  HISTORY OF PRESENT ILLNESS:   56 yo male found unresponsive and had large MCA infarct with occlusion of Lt carotid.  Had neuro IR intervention and remained on vent.  UDS positive for cocaine.  PMHx of CAD.  SUBJECTIVE:  Tolerating pressure support.  VITAL SIGNS: BP (!) 142/79   Pulse 85   Temp 99.3 F (37.4 C) (Axillary)   Resp 12   Ht  (1.778 m)   Wt 216 lb 0.8 oz (98 kg)   SpO2 98%   BMI 31.00 kg/m   VENTILATOR SETTINGS: Vent Mode: PRVC FiO2 (%):  [40 %-50 %] 40 % Set Rate:  [18 bmp] 18 bmp Vt Set:  [590 mL] 590 mL PEEP:  [5 cmH20] 5 cmH20 Plateau Pressure:  [17 cmH20-20 cmH20] 17 cmH20  INTAKE / OUTPUT: I/O last 3 completed shifts: In: 3828.7 [I.V.:2978.7; Other:750; IV Piggyback:100] Out: 2630 [Urine:2450; Emesis/NG output:180]  PHYSICAL EXAMINATION:  General - sedated Eyes - pupils reactive ENT - ETT in place Cardiac - regular, no murmur Chest - no wheeze, rales Abd - soft, non tender Ext - no edema Skin - no rashes Neuro - opens eyes spontaneously  LABS:  BMET  Recent Labs Lab 05/16/17 2338 05/17/17 0530  05/18/17 0124 05/18/17 0452 05/18/17 0920  NA 137 139  < > 149* 150* 150*  K 3.7 3.5  --   --  3.1*  --   CL 106 108  --   --  121*  --   CO2 23 22  --   --  22  --   BUN 18 14  --   --  11  --   CREATININE 0.90 0.95  --   --  0.78  --   GLUCOSE 123* 136*  --   --  151*  --   < > = values in this interval not displayed.  Electrolytes  Recent Labs Lab 05/16/17 2338 05/17/17 0530 05/18/17 0452  CALCIUM 8.5* 8.3* 8.0*  MG  --   --  2.2  PHOS  --   --  1.6*    CBC  Recent Labs Lab 05/16/17 1537 05/16/17 1540 05/18/17 0452  WBC 11.9*  --  11.2*  HGB 13.7 13.9 11.5*  HCT 39.8 41.0 35.6*  PLT 265  --  226    Coag's  Recent Labs Lab 05/16/17 1537  05/17/17 0530  APTT 31  --   INR 1.11 1.00    Sepsis Markers No results for input(s): LATICACIDVEN, PROCALCITON, O2SATVEN in the last 168 hours.  ABG  Recent Labs Lab 05/16/17 1945  PHART 7.366  PCO2ART 41.1  PO2ART 383*    Liver Enzymes  Recent Labs Lab 05/16/17 1537  AST 32  ALT 36  ALKPHOS 87  BILITOT 0.6  ALBUMIN 3.7    Cardiac Enzymes No results for input(s): TROPONINI, PROBNP in the last 168 hours.  Glucose  Recent Labs Lab 05/16/17 2334 05/17/17 0727 05/17/17 1125 05/17/17 1610 05/17/17 2244 05/18/17 0731  GLUCAP 108* 153* 138* 140* 107* 124*    Imaging Ct Head Wo Contrast  Result Date: 05/18/2017 CLINICAL DATA:  Follow-up of stroke EXAM: CT HEAD WITHOUT CONTRAST TECHNIQUE: Contiguous axial images were obtained from the base of the skull through the vertex without intravenous contrast. COMPARISON:  Head CT 05/17/2017 FINDINGS: Brain: Persistent hypoattenuation  throughout the entire left MCA territory. There is 4 mm of rightward midline shift at the level of the foramina of [place]. No acute hemorrhage. Mass effect on the left lateral ventricle is unchanged. There is no ventricular entrapment or hydrocephalus. Basal cisterns remain patent. Vascular: No hyperdense vessel or unexpected calcification. Skull: Normal visualized skull base, calvarium and extracranial soft tissues. Sinuses/Orbits: No sinus fluid levels or advanced mucosal thickening. No mastoid effusion. Normal orbits. IMPRESSION: 1. Unchanged appearance of cytotoxic edema throughout the left MCA territory with unchanged rightward midline shift measuring 4 mm. 2. No hemorrhage. 3. No ventricular entrapment or hydrocephalus. Electronically Signed   By: Deatra Robinson M.D.   On: 05/18/2017 01:04   Dg Chest Port 1 View  Result Date: 05/18/2017 CLINICAL DATA:  Ventilator dependent EXAM: PORTABLE CHEST 1 VIEW COMPARISON:  05/16/2017 FINDINGS: Endotracheal tube with the tip 2.6 cm above the carina.  Nasogastric tube coursing below the diaphragm. Right subclavian central venous catheter with the tip projecting over the cavoatrial junction. Low lung volumes. No focal consolidation. No pleural effusion or pneumothorax. Stable cardiomediastinal silhouette. No acute osseous abnormality. IMPRESSION: 1. Support lines and tubing in satisfactory position as described above. 2. Low lung volumes. Electronically Signed   By: Elige Ko   On: 05/18/2017 07:50    STUDIES:  CT Head 10/5 > Large left MCA nonhemorrhagic infarct with minimal sparing of super ganglionic cortex. Mass effect with effacement of the sulci and partial effacement of the left lateral ventricle. 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 Cerebral edema. Cocaine abuse. Hx of depression, anxiety. - continue ASA, plavix - neurosurgery consulted >> no planned interventions at this time - hypertonic saline per neurology  Compromised airway. - pressure support - extubation trial when neuro status more stable  Hypertension. Hx of CAD, HLD. - goal BP < 220/120 per neurology - lipitor  DM type II. - SSI  Hypokalemia. Hypophosphatemia. - replace as needed  DVT prophylaxis - SCDs SUP - protonix Nutrition - tube feeds Goals of care - Full code  CC time 31 minutes  Coralyn Helling, MD [facility] Pulmonary/Critical Care 05/18/2017, 11:07 AM Pager:  [phone] After 3pm call: [phone]

## 2017-05-18 NOTE — Progress Notes (Signed)
Neurosurgery Progress Note  No issues overnight.   EXAM:  BP (!) 142/79   Pulse 85   Temp (!) 97.4 F (36.3 C) (Axillary)   Resp 12   Ht 5\' 10"  (1.778 m)   Wt 98 kg (216 lb 0.8 oz)   SpO2 98%   BMI 31.00 kg/m   Drowsy Not actively following commands, but per nursing it is intermittent  PLAN Intermittently following commands Repeat head CT stable No NS intervention. Continue with management per Neuro/PCCM

## 2017-05-18 NOTE — Progress Notes (Signed)
OT Cancellation Note  Patient Details Name: Henry David MRN: [id number] DOB: [birth year]   Cancelled Treatment:    Reason Eval/Treat Not Completed: Patient not medically ready.  Gaye Alken M.S., OTR/L Pager: [phone]  05/18/2017, 9:26 AM

## 2017-05-18 NOTE — Progress Notes (Signed)
[facility] ADULT ICU REPLACEMENT PROTOCOL FOR AM LAB REPLACEMENT ONLY  The patient does apply for the [facility] Adult ICU Electrolyte Replacment Protocol based on the criteria listed below:   1. Is GFR >/= 40 ml/min? Yes.    Patient's GFR today is>60 2. Is urine output >/= 0.5 ml/kg/hr for the last 6 hours? Yes.   Patient's UOP is .76 ml/kg/hr 3. Is BUN < 60 mg/dL? Yes.    Patient's BUN today is 11 4. Abnormal electrolyte(s): 3.1 5. Ordered repletion with: per protocol 6. If a panic level lab has been reported, has the CCM MD in charge been notified? Yes.  .   Physician:  Dr. Janne Lab, Dixon Boos 05/18/2017 6:42 AM

## 2017-05-18 NOTE — Progress Notes (Signed)
STROKE TEAM PROGRESS NOTE   HISTORY OF PRESENT ILLNESS (per record) Henry David is a 56 y.o. male was a past medical history of anxiety, depression, hypertension, hyperlipidemia and coronary artery disease, last seen normal at 2 AM by his roommate, and did not wake up this evening. Upon checking on him, he had gaze to the left, was not talking and was unable to move the right side of his body. EMS was called and they brought him to the hospital emergency room here at [facility] as an acute code stroke. Patient is unable to provide any further history. There is no other family member attendant at the bedside to provide history. I examined the patient as he was rolled in as an acute code stroke in the emergency room bridge. Initial exam was consistent with a complete left MCA syndrome. 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 He was outside the TPA window on presentation  LKW: 0200 hrs. On 05/16/2017 tpa given?: no, outside the window And CT scan showing large MCA infarct Premorbid modified Rankin scale (mRS):  0     SUBJECTIVE (INTERVAL HISTORY) No family members present. The patient is intubated and sedated.Blood pressure has been controlled. Neurological he remains is slightly improved and now opens eyes but is aphasic and not following commands but is able to localize on the left. Repeat CT scan of the head this morning shows stable cytotoxic edema with 4 mm left-to-right midline shift. Serum sodium is at  goal on 85 mL of hypertonic saline   OBJECTIVE Temp:  [97.4 F (36.3 C)-99.9 F (37.7 C)] 99.9 F (37.7 C) (10/07 1136) Pulse Rate:  [71-105] 82 (10/07 1110) Cardiac Rhythm: Normal sinus rhythm (10/07 0800) Resp:  [10-24] 16 (10/07 1110) BP:  (105-148)/(59-97) 148/84 (10/07 1110) SpO2:  [97 %-100 %] 100 % (10/07 1110) FiO2 (%):  [40 %] 40 % (10/07 1110)  CBC:   Recent Labs Lab 05/16/17 1537 05/16/17 1540 05/18/17 0452  WBC 11.9*  --  11.2*  NEUTROABS 10.5*  --  9.2*  HGB 13.7 13.9 11.5*  HCT 39.8 41.0 35.6*  MCV 83.8  --  86.8  PLT 265  --  226    Basic Metabolic Panel:   Recent Labs Lab 05/17/17 0530  05/18/17 0452 05/18/17 0920  NA 139  < > 150* 150*  K 3.5  --  3.1*  --   CL 108  --  121*  --   CO2 22  --  22  --   GLUCOSE 136*  --  151*  --   BUN 14  --  11  --   CREATININE 0.95  --  0.78  --   CALCIUM 8.3*  --  8.0*  --   MG  --   --  2.2  --   PHOS  --   --  1.6*  --   < > = values in this interval not displayed.  Lipid Panel:     Component Value Date/Time   CHOL 186 05/17/2017 0500   CHOL 114 12/12/2016 1118   TRIG 532 (H) 05/17/2017 0500   HDL 26 (L) 05/17/2017 0500   HDL 27 (L) 12/12/2016 1118   CHOLHDL 7.2 05/17/2017 0500   VLDL UNABLE TO CALCULATE IF  TRIGLYCERIDE OVER 400 mg/dL 16/05/9603 5409   LDLCALC UNABLE TO CALCULATE IF TRIGLYCERIDE OVER 400 mg/dL 81/19/1478 2956   LDLCALC 40 12/12/2016 1118   HgbA1c:  Lab Results  Component Value Date   HGBA1C 6.1 (H) 05/17/2017   Urine Drug Screen:     Component Value Date/Time   LABOPIA NONE DETECTED 05/16/2017 1902   COCAINSCRNUR POSITIVE (A) 05/16/2017 1902   LABBENZ POSITIVE (A) 05/16/2017 1902   AMPHETMU NONE DETECTED 05/16/2017 1902   THCU NONE DETECTED 05/16/2017 1902   LABBARB NONE DETECTED 05/16/2017 1902    Alcohol Level     Component Value Date/Time   ETH <10 05/16/2017 1535     IMAGING   Ct Angio Head W Or Wo Contrast Ct Angio Neck W Or Wo Contrast 05/16/2017 IMPRESSION:  1. 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  2. No significant collateral flow to the left MCA territory.  3. Patent anterior communicating artery with retrograde  filling of the left A1 segment.  4. Minimal atherosclerotic calcification within the cavernous right internal carotid artery.  5. No other significant atherosclerotic disease within the head or neck.  6. Degenerative changes of the cervical spine are most pronounced at C6-7.    Ct Cerebral Perfusion W Contrast 05/16/2017 IMPRESSION:  Large left MCA territory nonhemorrhagic infarct neck exceeds size criteria for intra-arterial intervention.    Ct Head Code Stroke Wo Contrast 05/16/2017 IMPRESSION:  1. Large left MCA nonhemorrhagic infarct with minimal sparing of super ganglionic cortex.  2. Mass effect with effacement of the sulci and partial effacement of the left lateral ventricle.  3. ASPECTS is 10/10    Ct Head Wo Contrast 05/17/2017 IMPRESSION:  Large acute infarct in the distribution of the left middle cerebral artery.  Since the previous study, there is increasing mass effect with increased effacement of the left lateral ventricle.  Mild developing left-to-right midline shift of about 4 mm.  No acute intracranial hemorrhage.      Dg Chest Port 1 View 05/16/2017 IMPRESSION:  1. Low lung volumes.  2. Satisfactory positioning of support apparatus as above.     Transthoracic Echocardiogram - pending          PHYSICAL EXAM Vitals:   05/18/17 0800 05/18/17 0815 05/18/17 1110 05/18/17 1136  BP: (!) 147/78 (!) 142/79 (!) 148/84   Pulse: 85 85 82   Resp: Temp: 99.3 F (37.4 C)   99.9 F (37.7 C)  TempSrc: Axillary   Axillary  SpO2: 100% 98% 100%   Weight:      Height:       Obese middle-age Caucasian male was intubated and sedated. . Afebrile. Head is nontraumatic. Neck is supple without bruit.    Cardiac exam no murmur or gallop. Lungs are clear to auscultation. Distal pulses are well felt.  Neurological Exam : Patient is sedated and intubated. Does not follow any commands. Eyes open easily to stimulation. Pupils 4 mm right briskly reactive and  left is sluggish. Fundi not visualized. Mild vertical skew deviation the left eye hypotropia. Grimaces to pain. Has cough and gag. Response to sternal rub with localization in the left hand and   the left leg. Withdraws right lower extremity slightly to painful stimuli. Trace movement in the right upper extremity to deep pain only. Reflexes diminished on the right normal on the left. Right plantar upgoing left downgoing.   ASSESSMENT/PLAN Mr. Mert Dietrick  [place] is a 56 y.o. male with history of anxiety, depression, history of apical mural thrombus, hypertension, hyperlipidemia and coronary artery disease, presenting with aphasia and right sided weakness. He did not receive IV t-PA due to late presentation.  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 Mild developing left-to-right midline shift of about 4 mm.   MRI head - not performed.  MRA head - not performed  Carotid Doppler - CTA neck 2D Echo - Left ventricle: The cavity size was normal. Systolic function was   mildly reduced. The estimated ejection fraction was in the range   of 45% to 50%. Akinesis of the mid-apical anteroseptal, apical   inferoseptal, and apical myocardium. Hypokinesis of the apical   inferior myocardium. Left ventricular diastolic function    parameters were normal.  LDL - 26  HgbA1c - 6.1  VTE prophylaxis Diet NPO time specified  aspirin 81 mg daily and clopidogrel 75 mg daily prior to admission, now on  Rectal aspirini  Ongoing aggressive stroke risk factor management  Therapy recommendations:  - pending  Disposition:  Pending  Hypertension  Stable  Permissive hypertension (OK if < 220/120) but gradually normalize in 5-7 days  Long-term BP goal  normotensive  Hyperlipidemia  Home meds:  Lipitor 80 mg daily - not resumed in hospital  LDL 26, goal < 70  Resume Lipitor once PO access is available  Continue statin at discharge  Diabetes  HgbA1c 6.1, goal < 7.0  No previous history of diabetes  Cerebral Edema  Hypertonic saline - central line - increased to 85 cc's per hour  30 cc bolus 23.4% saline given 10/6/18Sodium - 150  Neuro Surgery following  Repeat head CT in AM   Other Stroke Risk Factors  Former cigarette smoker - quit  ETOH use, will be advised to drink no more than 1 drink per day.  Obesity, Body mass index is 31 kg/m., recommend weight loss, diet and exercise as appropriate   Coronary artery disease  Cocaine use  History of apical mural thrombus - await echo results    Other Active Problems  UDS - positive for cocaine and benzodiazapines  Hyperglycemia  Mild leukocytosis - axillary temp 100 - repeat CBC in AM  Left ICA occlusion  Hypokalemia -> treated    Hospital day # 2  Delton See PA-C [facility] Pager [phone] 05/18/2017, 12:16 PM I have personally examined this patient, reviewed notes, independently viewed imaging studies, participated in medical decision making and plan of care.ROS completed by me personally and pertinent positives fully documented  I have made any additions or clarifications directly to the above note.  He has presented with a large left middle cerebral artery infarct and is not developing cytotoxic cerebral edema and is likely at risk for brain herniation and that. He is intubated and on hypertonic saline. Neurosurgery has been consulted but feel he is not a hemicolectomy candidate at the present time. Recommend  Continue 3% saline drip rate to 85 mL an hour. If patient has further  neurological decline may need emergent hemicraniectomy. Discussed with Dr. Bevely Palmer neurosurgeon who agrees. . No family or friends available at the bedside.      Keep normothermic and euglycemic.Start tube feeds This patient is critically  ill and at significant risk of neurological worsening, death and care requires constant monitoring of vital signs, hemodynamics,respiratory and cardiac monitoring, extensive review of multiple databases, frequent neurological assessment, discussion with family, other specialists and medical decision making of high complexity.I have made any additions or clarifications directly to the above note.This critical care time does not reflect procedure time, or teaching time or supervisory time of PA/NP/Med Resident etc but could involve care discussion time.  I spent 35 minutes of neurocritical care time  in the care of  this patient.      Delia Heady, MD Medical Director [facility] Pager: [phone] 05/18/2017 12:16 PM  To contact Stroke Continuity provider, please refer to [email or web address]. After hours, contact General Neurology

## 2017-05-19 ENCOUNTER — Inpatient Hospital Stay (HOSPITAL_COMMUNITY): Payer: Medicaid Other

## 2017-05-19 DIAGNOSIS — Z452 Encounter for adjustment and management of vascular access device: Secondary | ICD-10-CM

## 2017-05-19 DIAGNOSIS — J9600 Acute respiratory failure, unspecified whether with hypoxia or hypercapnia: Secondary | ICD-10-CM

## 2017-05-19 DIAGNOSIS — Z4659 Encounter for fitting and adjustment of other gastrointestinal appliance and device: Secondary | ICD-10-CM

## 2017-05-19 DIAGNOSIS — G936 Cerebral edema: Secondary | ICD-10-CM

## 2017-05-19 DIAGNOSIS — Z9911 Dependence on respirator [ventilator] status: Secondary | ICD-10-CM

## 2017-05-19 LAB — GLUCOSE, CAPILLARY
GLUCOSE-CAPILLARY: 117 mg/dL — AB (ref 65–99)
Glucose-Capillary: 108 mg/dL — ABNORMAL HIGH (ref 65–99)
Glucose-Capillary: 143 mg/dL — ABNORMAL HIGH (ref 65–99)
Glucose-Capillary: 117 mg/dL — ABNORMAL HIGH (ref 65–99)
Glucose-Capillary: 106 mg/dL — ABNORMAL HIGH (ref 65–99)

## 2017-05-19 LAB — BASIC METABOLIC PANEL
ANION GAP: 6 (ref 5–15)
ANION GAP: 5 (ref 5–15)
BUN: 16 mg/dL (ref 6–20)
BUN: 18 mg/dL (ref 6–20)
CALCIUM: 8.2 mg/dL — AB (ref 8.9–10.3)
CHLORIDE: 126 mmol/L — AB (ref 101–111)
CO2: 23 mmol/L (ref 22–32)
CO2: 24 mmol/L (ref 22–32)
CREATININE: 0.9 mg/dL (ref 0.61–1.24)
Calcium: 8 mg/dL — ABNORMAL LOW (ref 8.9–10.3)
Chloride: 124 mmol/L — ABNORMAL HIGH (ref 101–111)
Creatinine, Ser: 0.81 mg/dL (ref 0.61–1.24)
GFR calc Af Amer: >60 mL/min (ref >60)
GFR calc non Af Amer: >60 mL/min (ref >60)
GLUCOSE: 134 mg/dL — AB (ref 65–99)
Glucose, Bld: 138 mg/dL — ABNORMAL HIGH (ref 65–99)
POTASSIUM: 2.9 mmol/L — AB (ref 3.5–5.1)
Potassium: 3.2 mmol/L — ABNORMAL LOW (ref 3.5–5.1)
SODIUM: 155 mmol/L — AB (ref 135–145)
Sodium: 153 mmol/L — ABNORMAL HIGH (ref 135–145)

## 2017-05-19 LAB — PHOSPHORUS: Phosphorus: 2.1 mg/dL — ABNORMAL LOW (ref 2.5–4.6)

## 2017-05-19 LAB — CBC
HEMATOCRIT: 34 % — AB (ref 39.0–52.0)
HEMOGLOBIN: 10.9 g/dL — AB (ref 13.0–17.0)
MCH: 27.9 pg (ref 26.0–34.0)
MCHC: 32.1 g/dL (ref 30.0–36.0)
MCV: 87.2 fL (ref 78.0–100.0)
Platelets: 235 10*3/uL (ref 150–400)
RBC: 3.9 MIL/uL — AB (ref 4.22–5.81)
RDW: 14.2 % (ref 11.5–15.5)
WBC: 8.6 10*3/uL (ref 4.0–10.5)

## 2017-05-19 LAB — MAGNESIUM: MAGNESIUM: 2.1 mg/dL (ref 1.7–2.4)

## 2017-05-19 LAB — SODIUM: Sodium: 157 mmol/L — ABNORMAL HIGH (ref 135–145)

## 2017-05-19 MED ORDER — ENOXAPARIN SODIUM 40 MG/0.4ML ~~LOC~~ SOLN
40.0000 mg | SUBCUTANEOUS | Status: DC
  Administered 2017-05-19 – 2017-06-03 (×15): 40 mg via SUBCUTANEOUS
  Filled 2017-05-19 (×15): qty 0.4

## 2017-05-19 MED ORDER — FENTANYL CITRATE (PF) 100 MCG/2ML IJ SOLN
100.0000 ug | INTRAMUSCULAR | Status: DC | PRN
  Administered 2017-05-19 – 2017-05-22 (×8): 100 ug via INTRAVENOUS
  Filled 2017-05-19 (×4): qty 2

## 2017-05-19 MED ORDER — POTASSIUM CHLORIDE 20 MEQ/15ML (10%) PO SOLN
40.0000 meq | ORAL | Status: AC
  Administered 2017-05-19 (×2): 40 meq
  Filled 2017-05-19 (×2): qty 30

## 2017-05-19 MED ORDER — FENTANYL CITRATE (PF) 100 MCG/2ML IJ SOLN
100.0000 ug | INTRAMUSCULAR | Status: DC | PRN
  Filled 2017-05-19 (×4): qty 2

## 2017-05-19 MED ORDER — ADULT MULTIVITAMIN LIQUID CH
15.0000 mL | Freq: Every day | ORAL | Status: DC
  Administered 2017-05-19: 15 mL
  Filled 2017-05-19: qty 15

## 2017-05-19 MED ORDER — HEPARIN SODIUM (PORCINE) 5000 UNIT/ML IJ SOLN: 5000.0000 [IU] | Freq: Three times a day (TID) | INTRAMUSCULAR | Status: DC

## 2017-05-19 MED ORDER — POTASSIUM PHOSPHATES 15 MMOLE/5ML IV SOLN
10.0000 meq | Freq: Once | INTRAVENOUS | Status: AC
  Administered 2017-05-19: 10 meq via INTRAVENOUS
  Filled 2017-05-19: qty 2.27

## 2017-05-19 NOTE — Progress Notes (Signed)
PULMONARY / CRITICAL CARE MEDICINE   Name: Henry David MRN: [id number] DOB: 12-15-1960    ADMISSION DATE:  05/16/2017   CHIEF COMPLAINT:  Found Down  HISTORY OF PRESENT ILLNESS:   56 yo male found unresponsive and had large MCA infarct with occlusion of Lt carotid.  He was not a candidate for tPA or endovascular intervention. UDS positive for cocaine.  PMHx of CAD.  SUBJECTIVE:  Intubated and tolerating ventilator well, appears comfortable. No acute events in the interval. Attempts to follow commands  VITAL SIGNS: BP (!) 148/82   Pulse 78   Temp 100 F (37.8 C) (Axillary)   Resp 11   Ht  (1.778 m)   Wt 216 lb 0.8 oz (98 kg)   SpO2 98%   BMI 31.00 kg/m   VENTILATOR SETTINGS: Vent Mode: CPAP;PSV FiO2 (%):  [30 %-40 %] 30 % Set Rate:  [18 bmp] 18 bmp Vt Set:  [590 mL] 590 mL PEEP:  [5 cmH20] 5 cmH20 Pressure Support:  [10 cmH20] 10 cmH20 Plateau Pressure:  [14 cmH20-19 cmH20] 16 cmH20  INTAKE / OUTPUT: I/O last 3 completed shifts: In: 3919 [I.V.:2983.5; Other:750; NG/GT:185.5] Out: 2650 [Urine:2630; Emesis/NG output:20]  PHYSICAL EXAMINATION:  General - sedated and intubated  Eyes - pupils reactive ENT - ETT secured in place Cardiac - regular, no murmur Chest - no wheeze, rales Abd - soft, non tender, +BS Ext - no edema Skin - warm, no rashes Neuro - opens eyes spontaneously, attempts to follow commands, +Babinski RLE  LABS:  BMET  Recent Labs Lab 05/17/17 0530  05/18/17 0452  05/18/17 1540 05/18/17 2250 05/19/17 0450  NA 139  < > 150*  < > 152* 155* 155*  K 3.5  --  3.1*  --   --   --  2.9*  CL 108  --  121*  --   --   --  126*  CO2 22  --  22  --   --   --  23  BUN 14  --  11  --   --   --  16  CREATININE 0.95  --  0.78  --   --   --  0.81  GLUCOSE 136*  --  151*  --   --   --  138*  < > = values in this interval not displayed.  Electrolytes  Recent Labs Lab 05/17/17 0530 05/18/17 0452 05/19/17 0450  CALCIUM 8.3* 8.0* 8.0*  MG   --  2.2 2.1  PHOS  --  1.6* 2.1*    CBC  Recent Labs Lab 05/16/17 1537 05/16/17 1540 05/18/17 0452 05/19/17 0450  WBC 11.9*  --  11.2* 8.6  HGB 13.7 13.9 11.5* 10.9*  HCT 39.8 41.0 35.6* 34.0*  PLT 265  --  226 235    Coag's  Recent Labs Lab 05/16/17 1537 05/17/17 0530  APTT 31  --   INR 1.11 1.00    ABG  Recent Labs Lab 05/16/17 1945  PHART 7.366  PCO2ART 41.1  PO2ART 383*    Liver Enzymes  Recent Labs Lab 05/16/17 1537  AST 32  ALT 36  ALKPHOS 87  BILITOT 0.6  ALBUMIN 3.7    Cardiac Enzymes No results for input(s): TROPONINI, PROBNP in the last 168 hours.  Glucose  Recent Labs Lab 05/17/17 1610 05/17/17 2244 05/18/17 0731 05/18/17 1136 05/18/17 1550 05/18/17 2159  GLUCAP 140* 107* 124* 114* 111* 110*    Imaging Dg Chest Port 1  View  Result Date: 05/19/2017 CLINICAL DATA:  Respiratory failure, CVA, intubated patient. EXAM: PORTABLE CHEST 1 VIEW COMPARISON:  Portable chest x-ray of May 18, 2017 FINDINGS: The lung volumes remain low. There is no alveolar infiltrate. The heart is mildly enlarged. The pulmonary vascularity is not engorged. The endotracheal tube tip lies 2.4 cm above the carina. The esophagogastric tube tip and proximal port project below the inferior margin of the image. The right subclavian venous catheter tip projects over the midportion of the SVC. IMPRESSION: Stable hypoinflation.  No pneumonia nor significant pulmonary edema. The support tubes are in reasonable position. Electronically Signed   By: David  [place] M.D.   On: 05/19/2017 07:21    STUDIES:  CT Head 10/5 > Large left MCA nonhemorrhagic infarct with minimal sparing of super ganglionic cortex. Mass effect with effacement of the sulci and partial effacement of the left lateral ventricle. 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 CT Head 10/7> L MCA cytotoxic edema with 4 mm  midline shift   CXR 10/8> Low lung volumes, ETT and CVC appropriate    Echo 10/6> EF 45-50%, areas of akinesis, no PFO or mural thrombus noted   CULTURES: MRSA PCR 10/5 > Negative   ANTIBIOTICS: None   SIGNIFICANT EVENTS: 10/5 - Presents to ED with large Left MCA CVA  10/6 - 3% NS  LINES/TUBES: ETT 10/5 >>  Right Subclavian CVC 10/5 >>  ASSESSMENT / PLAN:  NEURO: 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 Cocaine abuse. Hx of depression, anxiety. - continue ASA, plavix - neurosurgery consulted 10/6>> no planned interventions at this time - hypertonic saline per neurology, currently 85 ml/hr, Na 155 - RASS goal -1, fentanyl gtt, versed prn   PULM: Compromised airway d/t neuro status   - PRVC, ventilation settings as above  - SBT and extubation as mental status allows   CV: Hypertension. Hx of CAD, HLD. - goal BP < 220/120 per neurology, prn labetalol for higher  - lipitor 80  ENDOCRINE: DM type II. - SSI  RENAL: Hypokalemia. Hypophosphatemia. - replace as needed  GI:  - SUP ppx- protonix - Tube feeds per protocol   HEME: - DVT ppx- SCDs   ID: No active issues   Family: No family at bedside 10/8    STAFF NOTE: I, Rory Percy, MD FACP have personally reviewed patient's available data, including medical history, events of note, physical examination and test results as part of my evaluation. I have discussed with resident/NP and other care providers such as pharmacist, RN and RRT. In addition, I personally evaluated patient and elicited key findings of: awakens, not fc, moves left side, babinski rt, hemiplegia rt, lungs CTA, abdo soft, no r/f, no edema, pcxr which I reviewed shows atx, no defined infiltrates, line wnl, CT head which I reviewed shows large left mca terr ischemic stroke, with edema, he also was pos 2.2 liter last 24 hours, presenting with cocaine induced insult, ABg reviewed, keep same  MV, consideration PS 10, cpap 5, , if able reduce PS , keep TV and apnea alarms, keep 3% , Na to follow, avoid any d5 in fluids as able, NO free water, hypokalemia, replace K , follow renal fxn, NO lasix, bmet frequent, no roel ABX, feeding, follow BM, may benefit from early trach, wil follow clinical status, but IM am concerned about functional recovery and  airway protection skills for a while, scd, consider lovenox, avoid BB with unopposed allpha, if needed add prop The patient is critically ill with multiple organ systems failure and requires high complexity decision making for assessment and support, frequent evaluation and titration of therapies, application of advanced monitoring technologies and extensive interpretation of multiple databases.   Critical Care Time devoted to patient care services described in this note is35 Minutes. This time reflects time of care of this signee: Rory Percy, MD FACP. This critical care time does not reflect procedure time, or teaching time or supervisory time of PA/NP/Med student/Med Resident etc but could involve care discussion time. Rest per NP/medical resident whose note is outlined above and that I agree with   Mcarthur Rossetti. Tyson Alias, MD, FACP Pgr: [phone] [facility] 05/19/2017 8:51 AM

## 2017-05-19 NOTE — Progress Notes (Signed)
STROKE TEAM PROGRESS NOTE   HISTORY OF PRESENT ILLNESS (per record) Henry David is a 56 y.o. male was a past medical history of anxiety, depression, hypertension, hyperlipidemia and coronary artery disease, last seen normal at 2 AM by his roommate, and did not wake up this evening. Upon checking on him, he had gaze to the left, was not talking and was unable to move the right side of his body. EMS was called and they brought him to the hospital emergency room here at [facility] as an acute code stroke. Patient is unable to provide any further history. There is no other family member attendant at the bedside to provide history. I examined the patient as he was rolled in as an acute code stroke in the emergency room bridge. Initial exam was consistent with a complete left MCA syndrome. 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 He was outside the TPA window on presentation  LKW: 0200 hrs. On 05/16/2017 tpa given?: no, outside the window And CT scan showing large MCA infarct Premorbid modified Rankin scale (mRS):  0    SUBJECTIVE (INTERVAL HISTORY) No family members present. The patient is intubated and sedated. Blood pressure has been controlled. He is able to follow only some verbal commands and does not move the right side of his body. No significant changes in heart rate, blood pressure, or ventilator support.   OBJECTIVE Temp:  [98.1 F (36.7 C)-100 F (37.8 C)] 99.7 F (37.6 C) (10/08 0800) Pulse Rate:  [62-91] 77 (10/08 1000) Cardiac Rhythm: Normal sinus rhythm (10/07 2000) Resp:  [9-20] 18 (10/08 1000) BP: (130-171)/(67-92) 150/78 (10/08 1000) SpO2:  [95 %-100 %] 98 % (10/08 1000) FiO2 (%):  [30 %-40 %] 30 % (10/08 0731)  CBC:   Recent Labs Lab  05/16/17 1537  05/18/17 0452 05/19/17 0450  WBC 11.9*  --  11.2* 8.6  NEUTROABS 10.5*  --  9.2*  --   HGB 13.7  < > 11.5* 10.9*  HCT 39.8  < > 35.6* 34.0*  MCV 83.8  --  86.8 87.2  PLT 265  --  226 235  < > = values in this interval not displayed.  Basic Metabolic Panel:   Recent Labs Lab 05/18/17 0452  05/18/17 2250 05/19/17 0450  NA 150*  < > 155* 155*  K 3.1*  --   --  2.9*  CL 121*  --   --  126*  CO2 22  --   --  23  GLUCOSE 151*  --   --  138*  BUN 11  --   --  16  CREATININE 0.78  --   --  0.81  CALCIUM 8.0*  --   --  8.0*  MG 2.2  --   --  2.1  PHOS 1.6*  --   --  2.1*  < > = values in this interval not displayed.  Lipid Panel:     Component Value Date/Time   CHOL 186 05/17/2017 0500   CHOL 114 12/12/2016 1118   TRIG 532 (H) 05/17/2017 0500   HDL 26 (L) 05/17/2017 0500   HDL 27 (L) 12/12/2016 1118   CHOLHDL 7.2 05/17/2017 0500   VLDL UNABLE TO CALCULATE IF TRIGLYCERIDE OVER 400 mg/dL 16/05/9603 5409  LDLCALC UNABLE TO CALCULATE IF TRIGLYCERIDE OVER 400 mg/dL 69/62/9528 4132   LDLCALC 40 12/12/2016 1118   HgbA1c:  Lab Results  Component Value Date   HGBA1C 6.1 (H) 05/17/2017   Urine Drug Screen:     Component Value Date/Time   LABOPIA NONE DETECTED 05/16/2017 1902   COCAINSCRNUR POSITIVE (A) 05/16/2017 1902   LABBENZ POSITIVE (A) 05/16/2017 1902   AMPHETMU NONE DETECTED 05/16/2017 1902   THCU NONE DETECTED 05/16/2017 1902   LABBARB NONE DETECTED 05/16/2017 1902    Alcohol Level     Component Value Date/Time   ETH <10 05/16/2017 1535    IMAGING Ct Head Wo Contrast 05/18/2017 IMPRESSION: Unchanged appearance of cytotoxic edema throughout the left MCA territory with unchanged rightward midline shift measuring 4 mm. No hemorrhage. No ventricular entrapment or hydrocephalus.  Ct Head Wo Contrast 05/17/2017 IMPRESSION:  Large acute infarct in the distribution of the left middle cerebral artery.  Since the previous study, there is increasing  mass effect with increased effacement of the left lateral ventricle.  Mild developing left-to-right midline shift of about 4 mm.  No acute intracranial hemorrhage.    Ct Cerebral Perfusion W Contrast 05/16/2017 IMPRESSION:  Large left MCA territory nonhemorrhagic infarct neck exceeds size criteria for intra-arterial intervention.  Ct Angio Head W Or Wo Contrast Ct Angio Neck W Or Wo Contrast 05/16/2017 IMPRESSION:  1. 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  2. No significant collateral flow to the left MCA territory.  3. Patent anterior communicating artery with retrograde filling of the left A1 segment.  4. Minimal atherosclerotic calcification within the cavernous right internal carotid artery.  5. No other significant atherosclerotic disease within the head or neck.  6. Degenerative changes of the cervical spine are most pronounced at C6-7.   Ct Head Code Stroke Wo Contrast 05/16/2017 IMPRESSION:  1. Large left MCA nonhemorrhagic infarct with minimal sparing of super ganglionic cortex.  2. Mass effect with effacement of the sulci and partial effacement of the left lateral ventricle.  3. ASPECTS is 10/10   Dg Chest Port 1 View 05/19/2017 IMPRESSION: Stable hypoinflation.  No pneumonia nor significant pulmonary edema. The support tubes are in reasonable position.  Dg Chest Port 1 View 05/16/2017 IMPRESSION:  1. Low lung volumes.  2. Satisfactory positioning of support apparatus as above.   Transthoracic Echocardiogram 05/17/2017 LVEF 45-50% Akinesis of the mid-apical anteroseptal, apical inferoseptal, and apical myocardium. Hypokinesis of the apical inferior myocardium.     PHYSICAL EXAM Vitals:   05/19/17 0731 05/19/17 0800 05/19/17 0900 05/19/17 1000  BP: (!) 148/82 (!) 152/83 (!) 161/83 (!) 150/78  Pulse: 78 77 73 77  Resp: Temp:  99.7 F (37.6 C)    TempSrc:  Axillary     SpO2: 98% 98% 97% 98%  Weight:      Height:        Physical exam: Exam: Gen: Intubated, sedated           Eyes: PERRL, conjunctivae clear without exudates or hemorrhage Neuro: Cognition:   Follows some verbal commands Cranial Nerves:   Extraocular movements are intact. Left gaze preference. The face is symmetric. Motor Observation:    Left upper and lower extremity moving spontaneously, attempts to localize with left side Reflex Exam:   Right babinski upgoing, left downgoing, pain withdrawal on BLE and LUE   ASSESSMENT/PLAN Mr. MASOUD NYCE is a 56 y.o.  male with history of anxiety, depression, history of apical mural thrombus, hypertension, hyperlipidemia and coronary artery disease, presenting with aphasia and right sided weakness. He did not receive IV t-PA due to late presentation.  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 Mild developing left-to-right midline shift of about 4 mm.   MRI head - not performed.  MRA head - not performed  Carotid Doppler - CTA neck  2D Echo - LVEF 45% to 50%. Akinesis of the mid-apical anteroseptal, apical inferoseptal, and apical myocardium. Hypokinesis of the apical inferior myocardium.  LDL - 26  HgbA1c - 6.1  VTE prophylaxis Diet NPO time specified  aspirin 81 mg daily and clopidogrel 75 mg daily prior to admission, now on  Rectal aspirini  Ongoing aggressive stroke risk factor management  Therapy recommendations:  - pending  Disposition:  Pending  Hypertension  Stable  Permissive hypertension (OK if < 220/120) but gradually normalize in 5-7 days  Long-term BP goal normotensive  Hyperlipidemia  Home meds:  Lipitor 80 mg daily - resumed in hospital  LDL 26, goal < 70  Continue  statin at discharge  Diabetes  HgbA1c 6.1, goal < 7.0  No previous history of diabetes  Cerebral Edema  Hypertonic saline - central line - Na 155 so decreased to 50 cc's per hour  Neuro Surgery following  Other Stroke Risk Factors  Former cigarette smoker - quit  ETOH use, will be advised to drink no more than 1 drink per day.  Obesity, Body mass index is 31 kg/m., recommend weight loss, diet and exercise as appropriate   Coronary artery disease  Cocaine use  Left ICA occlusion  History of apical mural thrombus - no thrombus on repeat TTE although apical akinesis remains. Currently he remains at risk for worsening from cerebral edema but if improving neurologically we can consider TEE at end of the week to better exclude recurrent thrombus as a cardioembolic source.  Other Active Problems  Leukocytosis resolved to 8.6 today, Tmax 100 isolated  Hypokalemia -> replacing    Hospital day # 3  Fuller Plan, MD PGY-III Internal Medicine Resident Pager# [phone] 05/19/2017, 11:36 AM    To contact Stroke Continuity provider, please refer to [email or web address]. After hours, contact General Neurology

## 2017-05-19 NOTE — Care Management Note (Signed)
Case Management Note  Patient Details  Name: Henry David MRN: [id number] Date of Birth: 05/30/1961  Subjective/Objective:      Large left MCA nonhemorrhagic infarct    Action/Plan: Discharge Planning: Chart review. Pt lives in home with roommate, Bernell List # [phone]. Roommate found patient and call EMS. Waiting PT/OT recommendations when stable. Pt without health insurance. Will continue to follow for dc needs.   PCP- NONE   Expected Discharge Date:                  Expected Discharge Plan:  Skilled Nursing Facility  In-House Referral:  Clinical Social Work  Discharge planning Services  CM Consult  Post Acute Care Choice:    Choice offered to:     DME Arranged:    DME Agency:     HH Arranged:    HH Agency:     Status of Service:  In process, will continue to follow  If discussed at Long Length of Stay Meetings, dates discussed:    Additional Comments:  Elliot Cousin, RN 05/19/2017, 3:32 PM

## 2017-05-19 NOTE — Progress Notes (Addendum)
Nutrition Follow Up Note   DOCUMENTATION CODES:  Obesity unspecified  INTERVENTION:    Vital High Protein at goal rate of 15 ml/h (360 ml per day) and Prostat 60 ml qid to provide 1160 kcals, 152 gm protein, 300 ml free water daily.  MVI daily   NUTRITION DIAGNOSIS:  Inadequate oral intake related to inability to eat as evidenced by NPO status.  GOAL:  Provide needs based on ASPEN/SCCM guidelines  MONITOR:  Diet advancement, Vent status, Labs, Weight trends, I & O's  ASSESSMENT:  56 y/o male PMHx CAD, Anxiety, Depression, HTN, PTSD, MI. Roommate found patient unresponsive. Arrived via EMS with R hemiplegia. Worked up for large, evolving MCA infarct. Intubated for airway protection.   Pt tolerating tube feeds well. No new weight since admit. Pt remains intubated. Hypokalemia today; monitor and supplement as needed per MD discretion.   Medications reviewed and include: aspirin, plavix, lovenox, insulin, protonix, senokot, K Phos, NaCl 3% @50ml /hr  Labs reviewed: Na 157(H), K 2.9(L), Cl 126(H), Ca 8.0(L), P 2.1(L), Mg 2.1 wnl cbgs- 136, 151, 138 x 48hrs Triglycerides- 532(H)- 10/6  Patient is currently intubated on ventilator support MV: 11.8 L/min Temp (24hrs), Avg:99.3 F (37.4 C), Min:98.1 F (36.7 C), Max:100 F (37.8 C)  Propofol: none  Diet Order:  Diet NPO time specified  Skin: Abrasions to R/L legs  Last BM:  PTA  Height:  Ht Readings from Last 1 Encounters:  05/16/17 5\' 10"  (1.778 m)   Weight:  Wt Readings from Last 1 Encounters:  05/16/17 216 lb 0.8 oz (98 kg)   Ideal Body Weight:  75.45 kg  BMI:  Body mass index is 31 kg/m.  Estimated Nutritional Needs:  Kcal:  1080-1370 kcals (11-14 kcal/kg bw) Protein: >151 Fluid:  Per MD  EDUCATION NEEDS:  No education needs identified at this time  Betsey Holiday MS, RD, LDN Pager #[phone] After Hours Pager: [phone]

## 2017-05-19 NOTE — Progress Notes (Signed)
eLink Physician-Brief Progress Note Patient Name: Henry David DOB: 1960/11/12 MRN: [id number]   Date of Service  05/19/2017  HPI/Events of Note  Request to renew soft wrist restraint orders.   eICU Interventions  Will renew soft wrist restraint orders.      Intervention Category Minor Interventions: Agitation / anxiety - evaluation and management  Sommer,Steven Eugene 05/19/2017, 10:56 PM

## 2017-05-19 NOTE — Progress Notes (Signed)
 ( ) of fentanyl from bag wasted down sink with Francoise Schaumann RN

## 2017-05-19 NOTE — Progress Notes (Signed)
[facility] ADULT ICU REPLACEMENT PROTOCOL FOR AM LAB REPLACEMENT ONLY  The patient does apply for the [facility] Adult ICU Electrolyte Replacment Protocol based on the criteria listed below:   1. Is GFR >/= 40 ml/min? Yes.    Patient's GFR today is >60 2. Is urine output >/= 0.5 ml/kg/hr for the last 6 hours? Yes.   Patient's UOP is 2.3 ml/kg/hr 3. Is BUN < 60 mg/dL? Yes.    Patient's BUN today is 16 4. Abnormal electrolyte(s): 2.9 5. Ordered repletion with: per protocol 6. If a panic level lab has been reported, has the CCM MD in charge been notified? Yes.  .   Physician:  Dr. Janne Lab, Dixon Boos 05/19/2017 6:05 AM

## 2017-05-20 ENCOUNTER — Inpatient Hospital Stay (HOSPITAL_COMMUNITY): Payer: Medicaid Other

## 2017-05-20 DIAGNOSIS — F1410 Cocaine abuse, uncomplicated: Secondary | ICD-10-CM | POA: Diagnosis present

## 2017-05-20 DIAGNOSIS — I63232 Cerebral infarction due to unspecified occlusion or stenosis of left carotid arteries: Secondary | ICD-10-CM

## 2017-05-20 LAB — GLUCOSE, CAPILLARY
GLUCOSE-CAPILLARY: 96 mg/dL (ref 65–99)
GLUCOSE-CAPILLARY: 104 mg/dL — AB (ref 65–99)
GLUCOSE-CAPILLARY: 105 mg/dL — AB (ref 65–99)
Glucose-Capillary: 108 mg/dL — ABNORMAL HIGH (ref 65–99)
Glucose-Capillary: 91 mg/dL (ref 65–99)
Glucose-Capillary: 99 mg/dL (ref 65–99)

## 2017-05-20 LAB — CBC
HEMATOCRIT: 35.6 % — AB (ref 39.0–52.0)
Hemoglobin: 11.5 g/dL — ABNORMAL LOW (ref 13.0–17.0)
MCH: 28.2 pg (ref 26.0–34.0)
MCHC: 32.3 g/dL (ref 30.0–36.0)
MCV: 87.3 fL (ref 78.0–100.0)
Platelets: 250 10*3/uL (ref 150–400)
RBC: 4.08 MIL/uL — ABNORMAL LOW (ref 4.22–5.81)
RDW: 14.2 % (ref 11.5–15.5)
WBC: 9.7 10*3/uL (ref 4.0–10.5)

## 2017-05-20 LAB — URINALYSIS, ROUTINE W REFLEX MICROSCOPIC
BILIRUBIN URINE: NEGATIVE
GLUCOSE, UA: NEGATIVE mg/dL
HGB URINE DIPSTICK: NEGATIVE
KETONES UR: NEGATIVE mg/dL
Leukocytes, UA: NEGATIVE
Nitrite: NEGATIVE
PH: 7 (ref 5.0–8.0)
Protein, ur: NEGATIVE mg/dL
SPECIFIC GRAVITY, URINE: 1.021 (ref 1.005–1.030)

## 2017-05-20 LAB — BASIC METABOLIC PANEL
Anion gap: 8 (ref 5–15)
BUN: 23 mg/dL — AB (ref 6–20)
CALCIUM: 8.3 mg/dL — AB (ref 8.9–10.3)
CO2: 24 mmol/L (ref 22–32)
Chloride: 124 mmol/L — ABNORMAL HIGH (ref 101–111)
Creatinine, Ser: 0.89 mg/dL (ref 0.61–1.24)
GFR calc Af Amer: >60 mL/min (ref >60)
GLUCOSE: 113 mg/dL — AB (ref 65–99)
POTASSIUM: 3 mmol/L — AB (ref 3.5–5.1)
Sodium: 156 mmol/L — ABNORMAL HIGH (ref 135–145)

## 2017-05-20 LAB — PHOSPHORUS: Phosphorus: 3.5 mg/dL (ref 2.5–4.6)

## 2017-05-20 LAB — SODIUM
SODIUM: 153 mmol/L — AB (ref 135–145)
SODIUM: 155 mmol/L — AB (ref 135–145)
SODIUM: 157 mmol/L — AB (ref 135–145)

## 2017-05-20 LAB — MAGNESIUM: MAGNESIUM: 2.1 mg/dL (ref 1.7–2.4)

## 2017-05-20 MED ORDER — POTASSIUM CHLORIDE 20 MEQ/15ML (10%) PO SOLN
40.0000 meq | ORAL | Status: AC
  Administered 2017-05-20: 40 meq
  Filled 2017-05-20: qty 30

## 2017-05-20 MED ORDER — ORAL CARE MOUTH RINSE
15.0000 mL | Freq: Two times a day (BID) | OROMUCOSAL | Status: DC
  Administered 2017-05-20 – 2017-06-03 (×29): 15 mL via OROMUCOSAL

## 2017-05-20 NOTE — Plan of Care (Signed)
Problem: Education: Goal: Knowledge of disease or condition will improve Outcome: Not Progressing Pt non verbal, no evidence of learning. No family available.

## 2017-05-20 NOTE — Plan of Care (Signed)
Problem: Role Relationship: Goal: Method of communication will improve Outcome: Progressing SLP consult ordered. Pt globally aphasic.

## 2017-05-20 NOTE — Plan of Care (Signed)
Problem: Respiratory: Goal: Ability to maintain a clear airway and adequate ventilation will improve Outcome: Progressing Extubated at 10:55. Tolerating well so far as of 3 PM.

## 2017-05-20 NOTE — Plan of Care (Signed)
Problem: Nutritional: Goal: Intake of prescribed amount of daily calories will improve Outcome: Not Progressing Pt NPO after extubation - failed swallow screen. Consult placed for speech to see pt.

## 2017-05-20 NOTE — Progress Notes (Signed)
PULMONARY / CRITICAL CARE MEDICINE   Name: Henry David MRN: [id number] DOB: June 30, 1961    ADMISSION DATE:  05/16/2017   CHIEF COMPLAINT:  Found Down  HISTORY OF PRESENT ILLNESS:   56 yo male found unresponsive and had large MCA infarct with occlusion of Lt carotid.  He was not a candidate for tPA or endovascular intervention. UDS positive for cocaine.  PMHx of CAD.  SUBJECTIVE:  Remains intubated on pressure support this morning, tolerating the ventilator well. Net negative fluid balance last 24 hrs. Hemodynamically stable overnight, nurse reports low grade temperature this morning.   VITAL SIGNS: BP (!) 154/82   Pulse 64   Temp 98 F (36.7 C)   Resp 18   Ht 5\' 10"  (1.778 m)   Wt 216 lb 0.8 oz (98 kg)   SpO2 97%   BMI 31.00 kg/m   VENTILATOR SETTINGS: Vent Mode: PRVC FiO2 (%):  [30 %] 30 % Set Rate:  [18 bmp] 18 bmp Vt Set:  [590 mL] 590 mL PEEP:  [5 cmH20] 5 cmH20 Pressure Support:  [10 cmH20] 10 cmH20 Plateau Pressure:  [12 cmH20-17 cmH20] 13 cmH20  INTAKE / OUTPUT: I/O last 3 completed shifts: In: 3112.3 [I.V.:2320; NG/GT:540; IV Piggyback:252.3] Out: 4350 [Urine:4350]  PHYSICAL EXAMINATION:  General - intubated  Eyes - pupils reactive ENT - ETT secured in place Cardiac - regular, no murmur Chest - no wheeze, rales Abd - soft, non tender, +BS Ext - no edema Skin - warm, no rashes Neuro - opens eyes spontaneously, R sided hemiplegia, apparent aphasia, +Babinski RLE  LABS:  BMET  Recent Labs Lab 05/19/17 0450  05/19/17 1825 05/19/17 2350 05/20/17 0534  NA 155*  < > 153* 157* 156*  K 2.9*  --  3.2*  --  3.0*  CL 126*  --  124*  --  124*  CO2 23  --  24  --  24  BUN 16  --  18  --  23*  CREATININE 0.81  --  0.90  --  0.89  GLUCOSE 138*  --  134*  --  113*  < > = values in this interval not displayed.  Electrolytes  Recent Labs Lab 05/18/17 0452 05/19/17 0450 05/19/17 1825 05/20/17 0516 05/20/17 0534  CALCIUM 8.0* 8.0* 8.2*  --  8.3*   MG 2.2 2.1  --  2.1  --   PHOS 1.6* 2.1*  --  3.5  --     CBC  Recent Labs Lab 05/18/17 0452 05/19/17 0450 05/20/17 0534  WBC 11.2* 8.6 9.7  HGB 11.5* 10.9* 11.5*  HCT 35.6* 34.0* 35.6*  PLT 226 235 250    Coag's  Recent Labs Lab 05/16/17 1537 05/17/17 0530  APTT 31  --   INR 1.11 1.00    ABG  Recent Labs Lab 05/16/17 1945  PHART 7.366  PCO2ART 41.1  PO2ART 383*    Liver Enzymes  Recent Labs Lab 05/16/17 1537  AST 32  ALT 36  ALKPHOS 87  BILITOT 0.6  ALBUMIN 3.7    Cardiac Enzymes No results for input(s): TROPONINI, PROBNP in the last 168 hours.  Glucose  Recent Labs Lab 05/19/17 0747 05/19/17 1146 05/19/17 1552 05/19/17 1949 05/19/17 2309 05/20/17 0308  GLUCAP 108* 143* 117* 106* 117* 96    Imaging Dg Chest Port 1 View  Result Date: 05/20/2017 CLINICAL DATA:  ETT,STROKE,HX HTN EXAM: PORTABLE CHEST 1 VIEW COMPARISON:  one day prior FINDINGS: Endotracheal tube terminates 2.9 cm above carina.Nasogastric tube  extends beyond the inferior aspect of the film. Right subclavian line terminates at the low SVC. Midline trachea. Normal heart size. No pleural effusion or pneumothorax. Low lung volumes with right hemidiaphragm elevation and mild right base subsegmental atelectasis. IMPRESSION: No acute cardiopulmonary disease. Persistent low lung volumes with mild right base atelectasis. Electronically Signed   By: Jeronimo Greaves M.D.   On: 05/20/2017 07:13    STUDIES:  CT Head 10/5 > Large left MCA nonhemorrhagic infarct with minimal sparing of super ganglionic cortex. Mass effect with effacement of the sulci and partial effacement of the left lateral ventricle. 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 CT Head 10/7> L MCA cytotoxic edema with 4 mm midline shift   CXR 10/8> Low lung volumes, ETT and CVC appropriate CXR 10/9> Persistent but improved low lung volumes,  support devices in stable, appropriate position     Echo 10/6> EF 45-50%, areas of akinesis, no PFO or mural thrombus noted   CULTURES: MRSA PCR 10/5 > Negative   ANTIBIOTICS: None   SIGNIFICANT EVENTS: 10/5 - Presents to ED with large Left MCA CVA  10/6 - 3% NS  LINES/TUBES: ETT 10/5 >>  Right Subclavian CVC 10/5 >>  ASSESSMENT / PLAN:  NEURO: 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 Cocaine abuse. Hx of depression, anxiety. - continue ASA, plavix - neurosurgery consulted 10/6>> no planned interventions at this time - hypertonic saline per neurology, currently 50 ml/hr, Na 156 - RASS goal -1, fentanyl per protocol, versed prn   PULM: Compromised airway d/t neuro status   - PRVC, ventilation settings as above  - Weaning, SBT and extubation as mental status allows, can consider early tracheostomy depending on functional status    CV: Hypertension. Hx of CAD, HLD. - Permissive HTN per neurology, plan to gradually normalize  - lipitor 80  ENDOCRINE: Hgb A1c 6.1 - SSI  RENAL: Hypokalemia. Hypophosphatemia. - replace as needed  GI:  - SUP ppx- protonix - ContinueTube feeds per protocol   HEME: - DVT ppx- Lovenox  ID: No active issues   Family: No family at bedside 10/9   STAFF NOTE: I, Rory Percy, MD FACP have personally reviewed patient's available data, including medical history, events of note, physical examination and test results as part of my evaluation. I have discussed with resident/NP and other care providers such as pharmacist, RN and RRT. In addition, I personally evaluated patient and elicited key findings of: awaken and not fc, aphasia, left ext moving , perr, pcxr which I reviewed shows no well defined infiltrates, he had neg balance last 24 hours on 3%, Na in range , hypokalemia, replace K , bmet in am , weaning cpap 5 ps5, goal 1 hour, assess rsbi and airway protection, assess cough,  gag, continued tube feeds, some fever this am without infiltrate on pcxr, assess UA, may be central, no lasix  Required or safe today, control temps, glu wnl, no family  The patient is critically ill with multiple organ systems failure and requires high complexity decision making for assessment and support, frequent evaluation and titration of therapies, application of advanced monitoring technologies and extensive interpretation of multiple databases.   Critical Care Time devoted to patient care services described in this note is 35 Minutes. This time reflects time of care of this signee: Rory Percy, MD FACP. This critical care time does  not reflect procedure time, or teaching time or supervisory time of PA/NP/Med student/Med Resident etc but could involve care discussion time. Rest per NP/medical resident whose note is outlined above and that I agree with   Henry David. Henry Alias, MD, FACP Pgr: [phone] [facility] 05/20/2017 8:42 AM

## 2017-05-20 NOTE — Procedures (Addendum)
Extubation Procedure Note  Patient Details:   Name: Henry David DOB: 11/26/1960 MRN: [id number]   Airway Documentation:     Evaluation  O2 sats: stable throughout Complications: No apparent complications Patient did tolerate procedure well. Bilateral Breath Sounds: Rhonchi   Yes   PT was extubated to 4L [place]. No distress noted or stridor. Will continue to monitor.   Julieanne Manson 05/20/2017, 10:53 AM

## 2017-05-20 NOTE — Progress Notes (Signed)
Notified MD regarding Na lab draw for 0600 - 156. Informed by MD to keep 3% sodium gtt at rate of 7mL/hr at this time.  Will continue to monitor.  Francia Greaves, RN

## 2017-05-20 NOTE — Progress Notes (Signed)
STROKE TEAM PROGRESS NOTE   SUBJECTIVE (INTERVAL HISTORY) No family members present. The patient is intubated but awake and moving spontaneously. Blood pressure has been controlled. He was tolerating spontaneous breathing trial at time of exam and subsequently extubated.   OBJECTIVE Temp:  [98 F (36.7 C)-100.3 F (37.9 C)] 100.3 F (37.9 C) (10/09 1200) Pulse Rate:  [58-104] 76 (10/09 1053) Cardiac Rhythm: Normal sinus rhythm (10/08 2000) Resp:  [11-24] 23 (10/09 1053) BP: (123-176)/(62-108) 172/80 (10/09 1053) SpO2:  [95 %-100 %] 98 % (10/09 1053) FiO2 (%):  [30 %] 30 % (10/09 0727)  CBC:   Recent Labs Lab 05/16/17 1537  05/18/17 0452 05/19/17 0450 05/20/17 0534  WBC 11.9*  --  11.2* 8.6 9.7  NEUTROABS 10.5*  --  9.2*  --   --   HGB 13.7  < > 11.5* 10.9* 11.5*  HCT 39.8  < > 35.6* 34.0* 35.6*  MCV 83.8  --  86.8 87.2 87.3  PLT 265  --  226 235 250  < > = values in this interval not displayed.  Basic Metabolic Panel:   Recent Labs Lab 05/19/17 0450  05/19/17 1825  05/20/17 0516 05/20/17 0534 05/20/17 1200  NA 155*  < > 153*  < >  --  156* 155*  K 2.9*  --  3.2*  --   --  3.0*  --   CL 126*  --  124*  --   --  124*  --   CO2 23  --  24  --   --  24  --   GLUCOSE 138*  --  134*  --   --  113*  --   BUN 16  --  18  --   --  23*  --   CREATININE 0.81  --  0.90  --   --  0.89  --   CALCIUM 8.0*  --  8.2*  --   --  8.3*  --   MG 2.1  --   --   --  2.1  --   --   PHOS 2.1*  --   --   --  3.5  --   --   < > = values in this interval not displayed.  Lipid Panel:     Component Value Date/Time   CHOL 186 05/17/2017 0500   CHOL 114 12/12/2016 1118   TRIG 532 (H) 05/17/2017 0500   HDL 26 (L) 05/17/2017 0500   HDL 27 (L) 12/12/2016 1118   CHOLHDL 7.2 05/17/2017 0500   VLDL UNABLE TO CALCULATE IF TRIGLYCERIDE OVER 400 mg/dL 16/05/9603 5409   LDLCALC UNABLE TO CALCULATE IF TRIGLYCERIDE OVER 400 mg/dL 81/19/1478 2956   LDLCALC 40 12/12/2016 1118   HgbA1c:  Lab  Results  Component Value Date   HGBA1C 6.1 (H) 05/17/2017   Urine Drug Screen:     Component Value Date/Time   LABOPIA NONE DETECTED 05/16/2017 1902   COCAINSCRNUR POSITIVE (A) 05/16/2017 1902   LABBENZ POSITIVE (A) 05/16/2017 1902   AMPHETMU NONE DETECTED 05/16/2017 1902   THCU NONE DETECTED 05/16/2017 1902   LABBARB NONE DETECTED 05/16/2017 1902    Alcohol Level     Component Value Date/Time   ETH <10 05/16/2017 1535    IMAGING Ct Head Wo Contrast 05/18/2017 IMPRESSION: Unchanged appearance of cytotoxic edema throughout the left MCA territory with unchanged rightward midline shift measuring 4 mm. No hemorrhage. No ventricular entrapment or hydrocephalus.  Ct Head Wo Contrast 05/17/2017 IMPRESSION:  Large acute infarct in the distribution of the left middle cerebral artery.  Since the previous study, there is increasing mass effect with increased effacement of the left lateral ventricle.  Mild developing left-to-right midline shift of about 4 mm.  No acute intracranial hemorrhage.    Ct Cerebral Perfusion W Contrast 05/16/2017 IMPRESSION:  Large left MCA territory nonhemorrhagic infarct neck exceeds size criteria for intra-arterial intervention.  Ct Angio Head W Or Wo Contrast Ct Angio Neck W Or Wo Contrast 05/16/2017 IMPRESSION:  1. 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  2. No significant collateral flow to the left MCA territory.  3. Patent anterior communicating artery with retrograde filling of the left A1 segment.  4. Minimal atherosclerotic calcification within the cavernous right internal carotid artery.  5. No other significant atherosclerotic disease within the head or neck.  6. Degenerative changes of the cervical spine are most pronounced at C6-7.   Ct Head Code Stroke Wo Contrast 05/16/2017 IMPRESSION:  1. Large left MCA nonhemorrhagic infarct with minimal sparing of super  ganglionic cortex.  2. Mass effect with effacement of the sulci and partial effacement of the left lateral ventricle.  3. ASPECTS is 10/10   Dg Chest Port 1 View 05/19/2017 IMPRESSION: Stable hypoinflation.  No pneumonia nor significant pulmonary edema. The support tubes are in reasonable position.  Dg Chest Port 1 View 05/16/2017 IMPRESSION:  1. Low lung volumes.  2. Satisfactory positioning of support apparatus as above.   Transthoracic Echocardiogram 05/17/2017 LVEF 45-50% Akinesis of the mid-apical anteroseptal, apical inferoseptal, and apical myocardium. Hypokinesis of the apical inferior myocardium.     PHYSICAL EXAM Vitals:   05/20/17 0727 05/20/17 0730 05/20/17 1053 05/20/17 1200  BP: (!) 176/89  (!) 172/80   Pulse: 64  76   Resp: 14  (!) 23   Temp:  98 F (36.7 C)  100.3 F (37.9 C)  TempSrc:    Axillary  SpO2: 97%  98%   Weight:      Height:        Physical exam: General - Overweight, intubated but awake, not in distress  Cardiovascular - Regular rate and rhythm  Mental Status -  Level of arousal awake but orientation not addressed 2/2 intubation  Cranial Nerves II - XII - II - Visual field left gaze only. III, IV, VI - Left gaze preference with intact spontaneous movements. V -  VII - Facial movement intact bilaterally. VIII - Responds generally to voice X -  XI -  XII -   Motor Strength - Left sided movements spontaneous at least 4/5 strength, right side hemiplegia   Motor Tone - Flaccid on RUE and RLE, normal LLE and LUE  Reflexes - Withdraws LLE  Sensory -   Coordination -   Gait and Station -     ASSESSMENT/PLAN Mr. JUDAS PETTITT is a 56 y.o. male with history of anxiety, depression, cocaine use, history of apical mural thrombus, hypertension, hyperlipidemia and coronary artery disease, presenting with aphasia and right sided weakness. He did not receive IV t-PA due to late presentation.  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 Mild developing left-to-right midline shift of about 4 mm.  MRI head - not performed.  MRA head - not performed  Carotid Doppler - CTA neck  2D Echo - LVEF 45% to 50%. Akinesis of the mid-apical anteroseptal, apical inferoseptal, and apical myocardium. Hypokinesis of the apical inferior myocardium.  LDL - 26  HgbA1c - 6.1  VTE prophylaxis Diet NPO time specified  aspirin 81 mg daily and clopidogrel 75 mg daily prior to admission, now on  Rectal aspirini  Ongoing aggressive stroke risk factor management  Therapy recommendations:  - pending  Disposition:  Pending  Hypertension  Stable  Permissive hypertension (OK if < 220/120) but gradually normalize in 5-7 days  Long-term BP goal normotensive  Hyperlipidemia  Home meds:  Lipitor 80 mg daily - resumed in hospital  LDL 26, goal < 70  Continue statin at discharge  Diabetes  HgbA1c 6.1, goal < 7.0  No previous history of diabetes  Cerebral Edema  Hypertonic saline - central line - Na 155 continued 3% at 34mL/hr  Neuro Surgery following  Other Stroke Risk Factors  Former cigarette smoker - quit  ETOH use, will be advised to drink no more than 1 drink per day.  Obesity, Body mass index is 31 kg/m., recommend weight loss, diet and exercise as appropriate   Coronary artery disease  Cocaine use  Left ICA occlusion  History of apical mural thrombus  Other Active Problems  Hypokalemia -> replacing  He continues to improve well considering large volume L MCA infarct. No indication for surgical intervention. This suggests underlying vasculopathy with established collateralization, more consistent with cocaine vasculopathy than new embolic occlusion although this is  possible. If in good condition we can obtain MRI/MRA tomorrow and assess involved area and vessels 5 days in. Would still consider TEE later this week give Hx of mural thrombus 2/2 apical akinesis formerly on coumadin.  Hospital day # 4  Fuller Plan, MD PGY-III Internal Medicine Resident Pager# [phone] 05/20/2017, 1:37 PM    To contact Stroke Continuity provider, please refer to [email or web address]. After hours, contact General Neurology

## 2017-05-20 NOTE — Progress Notes (Signed)
Notified MD of 0000, Na lab result - 157. Given orders to maintain gtt rate at 85mL/hr and to call back with the results of the 0600 draw if out of goal range.  Will continue to monitor.  Francia Greaves, RN

## 2017-05-21 ENCOUNTER — Inpatient Hospital Stay (HOSPITAL_COMMUNITY): Payer: Medicaid Other

## 2017-05-21 LAB — CBC
HCT: 35.8 % — ABNORMAL LOW (ref 39.0–52.0)
Hemoglobin: 11.4 g/dL — ABNORMAL LOW (ref 13.0–17.0)
MCH: 27.5 pg (ref 26.0–34.0)
MCHC: 31.8 g/dL (ref 30.0–36.0)
MCV: 86.5 fL (ref 78.0–100.0)
PLATELETS: 241 10*3/uL (ref 150–400)
RBC: 4.14 MIL/uL — ABNORMAL LOW (ref 4.22–5.81)
RDW: 13.6 % (ref 11.5–15.5)
WBC: 9 10*3/uL (ref 4.0–10.5)

## 2017-05-21 LAB — BASIC METABOLIC PANEL
Anion gap: 7 (ref 5–15)
BUN: 15 mg/dL (ref 6–20)
CALCIUM: 8.3 mg/dL — AB (ref 8.9–10.3)
CO2: 25 mmol/L (ref 22–32)
Chloride: 120 mmol/L — ABNORMAL HIGH (ref 101–111)
Creatinine, Ser: 0.76 mg/dL (ref 0.61–1.24)
GFR calc Af Amer: >60 mL/min (ref >60)
GLUCOSE: 99 mg/dL (ref 65–99)
Potassium: 3.2 mmol/L — ABNORMAL LOW (ref 3.5–5.1)
SODIUM: 152 mmol/L — AB (ref 135–145)

## 2017-05-21 LAB — GLUCOSE, CAPILLARY
GLUCOSE-CAPILLARY: 95 mg/dL (ref 65–99)
GLUCOSE-CAPILLARY: 98 mg/dL (ref 65–99)
GLUCOSE-CAPILLARY: 94 mg/dL (ref 65–99)
GLUCOSE-CAPILLARY: 93 mg/dL (ref 65–99)
Glucose-Capillary: 100 mg/dL — ABNORMAL HIGH (ref 65–99)
Glucose-Capillary: 94 mg/dL (ref 65–99)

## 2017-05-21 LAB — SODIUM
SODIUM: 150 mmol/L — AB (ref 135–145)
SODIUM: 149 mmol/L — AB (ref 135–145)
Sodium: 150 mmol/L — ABNORMAL HIGH (ref 135–145)

## 2017-05-21 MED ORDER — RESOURCE THICKENUP CLEAR PO POWD
ORAL | Status: DC | PRN
  Filled 2017-05-21 (×2): qty 125

## 2017-05-21 MED ORDER — POTASSIUM CHLORIDE 10 MEQ/50ML IV SOLN
10.0000 meq | INTRAVENOUS | Status: AC
  Administered 2017-05-21 (×6): 10 meq via INTRAVENOUS
  Filled 2017-05-21 (×6): qty 50

## 2017-05-21 MED ORDER — ENSURE ENLIVE PO LIQD
237.0000 mL | Freq: Two times a day (BID) | ORAL | Status: DC
  Administered 2017-05-22: 237 mL via ORAL

## 2017-05-21 NOTE — Progress Notes (Signed)
Nutrition Follow-up  DOCUMENTATION CODES:   Obesity unspecified  INTERVENTION:   Magic cup TID with meals, each supplement provides 290 kcal and 9 grams of protein  Ensure Enlive po BID, each supplement provides 350 kcal and 20 grams of protein  Needs bowel regimen  NUTRITION DIAGNOSIS:   Inadequate oral intake related to dysphagia as evidenced by  (limited intake). Ongoing.   GOAL:   Patient will meet greater than or equal to 90% of their needs Progressing.   MONITOR:   PO intake, Supplement acceptance, Diet advancement  ASSESSMENT:   56 y/o male PMHx CAD, Anxiety, Depression, HTN, PTSD, MI, drug and etoh abuse. Roommate found patient unresponsive. Arrived via EMS with R hemiplegia. Worked up for large, evolving MCA infarct. Intubated for airway protection  10/9 extubated 10/10 Passed swallow eval  Pt remains aphasic due to large L MCA  Medications reviewed and include: SSI with meals, senokot-s, 3% hypertonic saline Labs reviewed: Na 152 (H), K+ 3.2 (L)    Diet Order:  DIET - DYS 1 Room service appropriate? Yes; Fluid consistency: Honey Thick  Skin:  Reviewed, no issues  Last BM:  PTA  Height:   Ht Readings from Last 1 Encounters:  05/16/17 5\' 10"  (1.778 m)    Weight:   Wt Readings from Last 1 Encounters:  05/16/17 216 lb 0.8 oz (98 kg)    Ideal Body Weight:  75.45 kg  BMI:  Body mass index is 31 kg/m.  Estimated Nutritional Needs:   Kcal:  2000-2200  Protein:  110-125 grams  Fluid:  > 2 L/day  EDUCATION NEEDS:   No education needs identified at this time  Kendell Bane RD, LDN, CNSC [phone] Pager [phone] After Hours Pager

## 2017-05-21 NOTE — Progress Notes (Signed)
MRI/MRA completed and images reviewed:  MRI HEAD IMPRESSION:  1. Large evolving acute ischemic infarct involving essentially the entirety of the left MCA territory, similar to previous exams. Associated petechial hemorrhage without frank hemorrhagic transformation, most notable at the left lentiform nucleus. 2. Associated mass effect with 12 mm of left-to-right midline shift, worsened relative to most recent CT from 05/18/2017. No hydrocephalus or ventricular trapping.  MRA HEAD IMPRESSION:  1. Occlusion of the left ICA at the level of the terminus with absent perfusion within the left middle cerebral artery. 2. Patent anterior communicating artery with perfusion of both anterior cerebral arteries, and minimal patchy retrograde flow within the left A1 segment. 3. Widely patent right middle cerebral artery and posterior Circulation.  Plan:  -Continue hypertonic saline -Continue ASA and Plavix. Petechial hemorrhage with relatively low risk of extension. Benefits for ischemic stroke prevention outweigh risks.  -Stroke team to round in AM  Electronically signed: Dr. Caryl Pina

## 2017-05-21 NOTE — Evaluation (Signed)
Speech Language Pathology Evaluation Patient Details Name: Henry David MRN: [id number] DOB: May 17, 1961 Today's Date: 05/21/2017 Time: 6578-4696 SLP Time Calculation (min) (ACUTE ONLY): 32 min  Problem List:  Patient Active Problem List   Diagnosis Date Noted  . Cocaine abuse (HCC)   . Encounter for central line care   . Encounter for orogastric (OG) tube placement   . Ventilator dependent (HCC)   . Acute respiratory failure (HCC)   . On mechanically assisted ventilation (HCC)   . Cerebral edema (HCC) 05/17/2017  . Brain herniation (HCC) 05/17/2017  . Acute ischemic stroke (HCC) 05/16/2017  . 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 Long term (current) use of anticoagulants [Z79.01] 01/03/2016  . Chest pain 12/29/2015  . Apical mural thrombus   . Pain in the chest   . Essential hypertension   . BPH (benign prostatic hyperplasia)   . Lower urinary tract symptoms 10/04/2015  . Obesity 10/04/2015  . Cardiomyopathy, ischemic 07/20/2015  . Prediabetes 07/13/2015  . Presence of drug coated stent in right coronary artery 07/12/2015  . 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 Hyperlipidemia with target LDL less than 70 07/10/2015  . CAD S/P LAD and RCA DES 07/10/2015  . Presence of drug coated stent in LAD coronary artery   . STEMI 07/09/15   . UPPER RESPIRATORY INFECTION (URI) 06/30/2009  . LACERATION-FINGER 06/30/2009  . ERECTILE DYSFUNCTION 06/22/2008  . ACTINIC KERATOSIS 09/23/2007  . Depression with anxiety 06/03/2007  . INFECTION, URINARY TRACT NOS 06/03/2007  . SHOULDER PAIN 06/03/2007  . 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 GERD 03/07/2007  . 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 DIVERTICULITIS, HX OF 03/07/2007   Past Medical History:  Past Medical History:  Diagnosis Date  . Anxiety   . Apical mural thrombus   . BPH (benign prostatic hyperplasia)   . Depression   . Diverticulitis   . Elevated cholesterol   . Essential hypertension   .  Post traumatic stress disorder   . STEMI (ST elevation myocardial infarction) [facility])    Past Surgical History:  Past Surgical History:  Procedure Laterality Date  . CARDIAC CATHETERIZATION N/A 07/09/2015   Procedure: Left Heart Cath and Coronary Angiography;  Surgeon: Lennette Bihari, MD;  Location: [facility] INVASIVE CV LAB;  Service: Cardiovascular;  Laterality: N/A;  . CARDIAC CATHETERIZATION N/A 07/09/2015   Procedure: Coronary Stent Intervention;  Surgeon: Lennette Bihari, MD;  Location: [facility] INVASIVE CV LAB;  Service: Cardiovascular;  Laterality: N/A;  . CARDIAC CATHETERIZATION N/A 07/11/2015   Procedure: Coronary Stent Intervention;  Surgeon: Lennette Bihari, MD;  Location: [facility] INVASIVE CV LAB;  Service: Cardiovascular;  Laterality: N/A;  . KNEE SURGERY     HPI:  Henry David a 56 y.o.malewith history of anxiety, depression, diverticulitis, GERD, cocaine use,history of apical mural thrombus, hypertension, hyperlipidemia and coronary artery diseasepresenting with global aphasia, left gaze preference and right hemiplegia. Hedidnot receive IV t-PA due to late presentation. Intubated 10/5-10/9. Head MRI 10/10 shows large evolving acute ischemic infarct involving essentially the entirety of the left MCA territory, similar to previous exams and associated mass effect with 12 mm of left-to-right midline shift, worsened relative to most recent CT from 05/18/2017.    Assessment / Plan / Recommendation Clinical Impression  Pt presents with speech and language characteristic of global aphasia; demonstrates left gaze preference. Despite max verbal/tactile cueing, pt did not phonate; MIT attempted with automatic speech tasks (song, counting) with no success. Pt demonstrated ability to  complete automatic functional tasks (combing hair and wiping face) when given moderate tactile cueing and attended to right side with both tasks; however, would not follow 1-step commands despite max verbal/tactile cueing.  When prompted with max verbal/tactile/model cues, he approximated gestures (thumbs up/down, waving) but was not accurate. Unable to accurately assess cognition 2/2 language deficits. Will f/u for functional communication.      SLP Assessment  SLP Recommendation/Assessment: Patient needs continued Speech Lanaguage Pathology Services SLP Visit Diagnosis: Frontal lobe and executive function deficit Frontal lobe and executive function deficit following: Cerebral infarction    Follow Up Recommendations  Skilled Nursing facility    Frequency and Duration min 2x/week  2 weeks      SLP Evaluation Cognition  Overall Cognitive Status: Difficult to assess Arousal/Alertness: Awake/alert Orientation Level: Disoriented X4 Attention: Focused Focused Attention: Impaired Safety/Judgment: Impaired       Comprehension  Auditory Comprehension Overall Auditory Comprehension: Impaired Commands: Impaired One Step Basic Commands: 0-24% accurate    Expression Expression Primary Mode of Expression: Other (comment) (globally aphasic) Verbal Expression Overall Verbal Expression: Impaired Initiation: Impaired Automatic Speech: Counting;Singing (Attempted, unsuccessful)   Oral / Motor  Oral Motor/Sensory Function Overall Oral Motor/Sensory Function: Moderate impairment Facial ROM: Reduced right Facial Symmetry: Abnormal symmetry right Facial Strength: Reduced right Facial Sensation: Reduced right Mandible: Within Functional Limits Motor Speech Phonation: Aphonic   GO                    Carmela Rima, Student SLP 05/21/2017, 9:37 AM

## 2017-05-21 NOTE — Progress Notes (Signed)
PULMONARY / CRITICAL CARE MEDICINE   Name: Henry David MRN: [id number] DOB: 07/18/1961    ADMISSION DATE:  05/16/2017   CHIEF COMPLAINT:  Found Down  HISTORY OF PRESENT ILLNESS:   56 yo male found unresponsive and had large MCA infarct with occlusion of Lt carotid.  He was not a candidate for tPA or endovascular intervention. UDS positive for cocaine.  PMHx of CAD.  SUBJECTIVE:  Extubated yesterday following rounds and has remained stable on [place]. Imaging showed worsened cerebral edema and midline shift, remains on hypertonic saline. Failed swallow screen post-extubation and has SLP eval pending.   VITAL SIGNS: BP (!) 164/126   Pulse (!) 57   Temp 99.2 F (37.3 C) (Axillary)   Resp (!) 21   Ht  (1.778 m)   Wt 216 lb 0.8 oz (98 kg)   SpO2 97%   BMI 31.00 kg/m   VENTILATOR SETTINGS: Vent Mode: PSV;CPAP FiO2 (%):  [30 %] 30 % PEEP:  [5 cmH20] 5 cmH20 Pressure Support:  [10 cmH20] 10 cmH20  INTAKE / OUTPUT: I/O last 3 completed shifts: In: 2093.9 [I.V.:1899.2; NG/GT:194.8] Out: 3325 [Urine:3325]  PHYSICAL EXAMINATION:  General - resting in bed, on [place]   Eyes - PERRL ENT - moist membranes  Cardiac - RRR, s1, s2 Chest - no wheezes or rhonchi  Abd - soft, no TTP, +BS Ext - no edema Skin - warm, no rashes Neuro - alert, continues to be aphasic and unable to fc, good strength on L, hemiplegia and +Babinski on R   LABS:  BMET  Recent Labs Lab 05/19/17 1825  05/20/17 0534  05/20/17 1843 05/20/17 2355 05/21/17 0535  NA 153*  < > 156*  < > 153* 150* 152*  K 3.2*  --  3.0*  --   --   --  3.2*  CL 124*  --  124*  --   --   --  120*  CO2 24  --  24  --   --   --  25  BUN 18  --  23*  --   --   --  15  CREATININE 0.90  --  0.89  --   --   --  0.76  GLUCOSE 134*  --  113*  --   --   --  99  < > = values in this interval not displayed.  Electrolytes  Recent Labs Lab 05/18/17 0452 05/19/17 0450 05/19/17 1825 05/20/17 0516 05/20/17 0534 05/21/17 0535   CALCIUM 8.0* 8.0* 8.2*  --  8.3* 8.3*  MG 2.2 2.1  --  2.1  --   --   PHOS 1.6* 2.1*  --  3.5  --   --     CBC  Recent Labs Lab 05/19/17 0450 05/20/17 0534 05/21/17 0535  WBC 8.6 9.7 9.0  HGB 10.9* 11.5* 11.4*  HCT 34.0* 35.6* 35.8*  PLT 235 250 241    Coag's  Recent Labs Lab 05/16/17 1537 05/17/17 0530  APTT 31  --   INR 1.11 1.00    ABG  Recent Labs Lab 05/16/17 1945  PHART 7.366  PCO2ART 41.1  PO2ART 383*    Liver Enzymes  Recent Labs Lab 05/16/17 1537  AST 32  ALT 36  ALKPHOS 87  BILITOT 0.6  ALBUMIN 3.7    Cardiac Enzymes No results for input(s): TROPONINI, PROBNP in the last 168 hours.  Glucose  Recent Labs Lab 05/20/17 0751 05/20/17 1143 05/20/17 1547 05/20/17 1947 05/20/17  2314 05/21/17 0318  GLUCAP 108* 104* 105* 91 99 95    Imaging Mr [employer] Wo Contrast  Result Date: 05/21/2017 CLINICAL DATA:  Follow-up examination for acute stroke. EXAM: MRI HEAD WITHOUT CONTRAST MRA HEAD WITHOUT CONTRAST TECHNIQUE: Multiplanar, multiecho pulse sequences of the brain and surrounding structures were obtained without intravenous contrast. Angiographic images of the head were obtained using MRA technique without contrast. COMPARISON:  Prior CTA from 05/16/2017 as well as previous CT knees, most recent of which from 05/18/2017. FINDINGS: MRI HEAD FINDINGS Brain: Extremely large confluent infarct involving essentially the entirety of the left MCA territory again seen, overall similar in distribution to previous exams. There is involvement of the left basal ganglia including the caudate and left lentiform nuclei. Infarct extends into the left fornix. Associated petechial hemorrhage within the left lentiform nucleus without frank hemorrhagic transformation. Few additional subcentimeter scattered areas of petechial hemorrhage noted within the area of infarction. Associated mass effect with effacement of the left lateral ventricle and 12 mm of left-to-right  midline shift. Basilar cisterns are crowded but remain patent. No other acute intracranial infarct. No other evidence for acute or chronic intracranial hemorrhage. No mass lesion. No hydrocephalus or ventricular trapping. No extra-axial fluid collection. Chronic microvascular ischemic changes noted within the pons. Vascular: Absent flow void present within the left M1 segment and distal left MCA branches. Superimposed susceptibility artifact at the level of the left M1 consistent with thrombus. Major intracranial vascular flow voids otherwise maintained. Skull and upper cervical spine: Craniocervical junction within normal limits. Upper cervical spine normal. Bone marrow signal intensity normal. No scalp soft tissue abnormality. Sinuses/Orbits: Globes and orbital soft tissues within normal limits. Mild mucosal thickening with small retention cyst noted within the maxillary sinuses. Paranasal sinuses otherwise largely clear. No mastoid effusion. Inner ear structures normal. Other: None. MRA HEAD FINDINGS ANTERIOR CIRCULATION: Attenuated flow within mean distal left ICA which becomes occluded by the level of the terminus, similar to prior CTA. There is absent flow within the left M1 segment and distal left MCA branches distally. Widely patent flow within the distal cervical right ICA. Petrous, cavernous, and supraclinoid right ICA widely patent without stenosis. Right A1 segment patent. Patent anterior communicating artery. Both the right and left ACA is are well perfused to their distal aspects. Minimal patchy retrograde flow noted within the left A1 segment. Right M1 patent without high-grade stenosis or occlusion. Normal right MCA bifurcation. No proximal right M2 occlusion. Distal right MCA branches well perfused. POSTERIOR CIRCULATION: Visualized vertebral artery is widely patent to the vertebrobasilar junction. Basilar artery widely patent to its distal aspect. Superior cerebral arteries patent bilaterally. Both  of the posterior cerebral arteries are supplied via the basilar and are well perfused to their distal aspects. Mild atheromatous irregularity noted within the left P 2 segment without stenosis. IMPRESSION: MRI HEAD IMPRESSION: 1. Large evolving acute ischemic infarct involving essentially the entirety of the left MCA territory, similar to previous exams. Associated petechial hemorrhage without frank hemorrhagic transformation, most notable at the left lentiform nucleus. 2. Associated mass effect with 12 mm of left-to-right midline shift, worsened relative to most recent CT from 05/18/2017. No hydrocephalus or ventricular trapping. MRA HEAD IMPRESSION: 1. Occlusion of the left ICA at the level of the terminus with absent perfusion within the left middle cerebral artery. 2. Patent anterior communicating artery with perfusion of both anterior cerebral arteries, and minimal patchy retrograde flow within the left A1 segment. 3. Widely patent right middle cerebral artery and  posterior circulation. Electronically Signed   By: Rise Mu M.D.   On: 05/21/2017 03:43   Mr Brain Wo Contrast  Result Date: 05/21/2017 CLINICAL DATA:  Follow-up examination for acute stroke. EXAM: MRI HEAD WITHOUT CONTRAST MRA HEAD WITHOUT CONTRAST TECHNIQUE: Multiplanar, multiecho pulse sequences of the brain and surrounding structures were obtained without intravenous contrast. Angiographic images of the head were obtained using MRA technique without contrast. COMPARISON:  Prior CTA from 05/16/2017 as well as previous CT knees, most recent of which from 05/18/2017. FINDINGS: MRI HEAD FINDINGS Brain: Extremely large confluent infarct involving essentially the entirety of the left MCA territory again seen, overall similar in distribution to previous exams. There is involvement of the left basal ganglia including the caudate and left lentiform nuclei. Infarct extends into the left fornix. Associated petechial hemorrhage within the  left lentiform nucleus without frank hemorrhagic transformation. Few additional subcentimeter scattered areas of petechial hemorrhage noted within the area of infarction. Associated mass effect with effacement of the left lateral ventricle and 12 mm of left-to-right midline shift. Basilar cisterns are crowded but remain patent. No other acute intracranial infarct. No other evidence for acute or chronic intracranial hemorrhage. No mass lesion. No hydrocephalus or ventricular trapping. No extra-axial fluid collection. Chronic microvascular ischemic changes noted within the pons. Vascular: Absent flow void present within the left M1 segment and distal left MCA branches. Superimposed susceptibility artifact at the level of the left M1 consistent with thrombus. Major intracranial vascular flow voids otherwise maintained. Skull and upper cervical spine: Craniocervical junction within normal limits. Upper cervical spine normal. Bone marrow signal intensity normal. No scalp soft tissue abnormality. Sinuses/Orbits: Globes and orbital soft tissues within normal limits. Mild mucosal thickening with small retention cyst noted within the maxillary sinuses. Paranasal sinuses otherwise largely clear. No mastoid effusion. Inner ear structures normal. Other: None. MRA HEAD FINDINGS ANTERIOR CIRCULATION: Attenuated flow within mean distal left ICA which becomes occluded by the level of the terminus, similar to prior CTA. There is absent flow within the left M1 segment and distal left MCA branches distally. Widely patent flow within the distal cervical right ICA. Petrous, cavernous, and supraclinoid right ICA widely patent without stenosis. Right A1 segment patent. Patent anterior communicating artery. Both the right and left ACA is are well perfused to their distal aspects. Minimal patchy retrograde flow noted within the left A1 segment. Right M1 patent without high-grade stenosis or occlusion. Normal right MCA bifurcation. No  proximal right M2 occlusion. Distal right MCA branches well perfused. POSTERIOR CIRCULATION: Visualized vertebral artery is widely patent to the vertebrobasilar junction. Basilar artery widely patent to its distal aspect. Superior cerebral arteries patent bilaterally. Both of the posterior cerebral arteries are supplied via the basilar and are well perfused to their distal aspects. Mild atheromatous irregularity noted within the left P 2 segment without stenosis. IMPRESSION: MRI HEAD IMPRESSION: 1. Large evolving acute ischemic infarct involving essentially the entirety of the left MCA territory, similar to previous exams. Associated petechial hemorrhage without frank hemorrhagic transformation, most notable at the left lentiform nucleus. 2. Associated mass effect with 12 mm of left-to-right midline shift, worsened relative to most recent CT from 05/18/2017. No hydrocephalus or ventricular trapping. MRA HEAD IMPRESSION: 1. Occlusion of the left ICA at the level of the terminus with absent perfusion within the left middle cerebral artery. 2. Patent anterior communicating artery with perfusion of both anterior cerebral arteries, and minimal patchy retrograde flow within the left A1 segment. 3. Widely patent right middle cerebral  artery and posterior circulation. Electronically Signed   By: Rise Mu M.D.   On: 05/21/2017 03:43    STUDIES:  CT Head 10/5 > Large left MCA nonhemorrhagic infarct with minimal sparing of super ganglionic cortex. Mass effect with effacement of the sulci and partial effacement of the left lateral ventricle. 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 CT Head 10/7> L MCA cytotoxic edema with 4 mm midline shift   CXR 10/8> Low lung volumes, ETT and CVC appropriate CXR 10/9> Persistent but improved low lung volumes, support devices in stable, appropriate position     Echo 10/6> EF 45-50%,  areas of akinesis, no PFO or mural thrombus noted   MRI/MRA Head 10/10 > Evolving L MCA territory infarct with worsened edema, 12 mm midline shift, Occlusion of L ICA  CULTURES: MRSA PCR 10/5 > Negative   ANTIBIOTICS: None   SIGNIFICANT EVENTS: 10/5 - Presents to ED with large Left MCA CVA  10/6 - 3% NS 10/9- Extubated   LINES/TUBES: ETT 10/5 >> 10/9 Right Subclavian CVC 10/5 >>  ASSESSMENT / PLAN:  NEURO: 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 Cocaine abuse. Hx of depression, anxiety. - continue ASA, plavix - neurosurgery consulted 10/6>> no planned interventions at this time - worsened cerebral edema on imaging 10/9 - hypertonic saline per neurology, currently 50 ml/hr, Na 152  PULM: Compromised airway d/t neuro status, extubated 10/9  -Supplemental O2 prn, monitor airway protection and neuro status   CV: Hypertension. Hx of CAD, HLD. - Permissive HTN per neurology, plan to gradually normalize  - lipitor 80  ENDOCRINE: Hgb A1c 6.1 - SSI  RENAL: Hypokalemia. Hypophosphatemia. - replace as needed  GI:  - Failed swallow screen post intubation, SLP pending  - SUP ppx- protonix - ContinueTube feeds per protocol   HEME: - DVT ppx- Lovenox  ID: No active issues, U/A 10/9 clean   Family: No family at bedside 10/10  STAFF NOTE: I, Rory Percy, MD FACP have personally reviewed patient's available data, including medical history, events of note, physical examination and test results as part of my evaluation. I have discussed with resident/NP and other care providers such as pharmacist, RN and RRT. In addition, I personally evaluated patient and elicited key findings of: awake, alert, cooperative, no distress, jvd wnl, coarse BS but good air entry, abdo soft, edema min, was neg 1 liter, pcxr last I reviewed with some atx, MRI brain I reviewed shows large left MCA with small petechial bleeds, edema was  worse and shift but his clinical status neuro is about the same, will discuss with neuro role 3% saline with clinical status over edema noted, cough weak but present and spontaneously, Na range 154-155, na on 2% to maintain, hypokalemia, supp k , likely dysphagia, get objective SLP, conisder pt, ot, uproght as able, for fees, if fails slp then place ngt and feed with strict aspiration risk, we are following q6h bmet for na on 3%,  Asa, plavix per neuro, lovenox for dvt prevention, BP sys in range, NTS if needed, if any resp declines = intubation  = trach  Critical Care Time devoted to patient care services described in this note is 30 Minutes. This time reflects time of care of this signee: Rory Percy, MD FACP. This critical care time does not reflect procedure time, or teaching time or supervisory time of  PA/NP/Med student/Med Resident etc but could involve care discussion time. Rest per NP/medical resident whose note is outlined above and that I agree with   Mcarthur Rossetti. Tyson Alias, MD, FACP Pgr: [phone] [facility] 05/21/2017 9:37 AM     Mcarthur Rossetti. Tyson Alias, MD, FACP Pgr: [phone] [facility] 05/21/2017 9:31 AM

## 2017-05-21 NOTE — Procedures (Signed)
Objective Swallowing Evaluation: Type of Study: FEES-Fiberoptic Endoscopic Evaluation of Swallow  Patient Details  Name: Henry David MRN: [id number] Date of Birth: [birth year]  Today's Date: 05/21/2017 Time: SLP Start Time (ACUTE ONLY): 1130-SLP Stop Time (ACUTE ONLY): 1157 SLP Time Calculation (min) (ACUTE ONLY): 27 min  Past Medical History:  Past Medical History:  Diagnosis Date  . Anxiety   . Apical mural thrombus   . BPH (benign prostatic hyperplasia)   . Depression   . Diverticulitis   . Elevated cholesterol   . Essential hypertension   . Post traumatic stress disorder   . STEMI (ST elevation myocardial infarction) [facility])    Past Surgical History:  Past Surgical History:  Procedure Laterality Date  . CARDIAC CATHETERIZATION N/A 07/09/2015   Procedure: Left Heart Cath and Coronary Angiography;  Surgeon: Lennette Bihari, MD;  Location: [facility] INVASIVE CV LAB;  Service: Cardiovascular;  Laterality: N/A;  . CARDIAC CATHETERIZATION N/A 07/09/2015   Procedure: Coronary Stent Intervention;  Surgeon: Lennette Bihari, MD;  Location: [facility] INVASIVE CV LAB;  Service: Cardiovascular;  Laterality: N/A;  . CARDIAC CATHETERIZATION N/A 07/11/2015   Procedure: Coronary Stent Intervention;  Surgeon: Lennette Bihari, MD;  Location: [facility] INVASIVE CV LAB;  Service: Cardiovascular;  Laterality: N/A;  . KNEE SURGERY     HPI: [name] a 56 y.o.malewith history of anxiety, depression, diverticulitis, GERD, cocaine use,history of apical mural thrombus, hypertension, hyperlipidemia and coronary artery diseasepresenting with global aphasia, left gaze preference and right hemiplegia. Hedidnot receive IV t-PA due to late presentation. Intubated 10/5-10/9. Head MRI 10/10 shows large evolving acute ischemic infarct involving essentially the entirety of the left MCA territory, similar to previous exams and associated mass effect with 12 mm of left-to-right midline shift, worsened relative to most recent  CT from 05/18/2017.   No Data Recorded   Assessment / Plan / Recommendation  CHL IP CLINICAL IMPRESSIONS 05/21/2017  Clinical Impression Pt demonstrates oral dyaphgia secondarty to right CN VII sensory impairment with mild unsensed anterior spillage and oral residuals that sometimes spill to the pharynx and trigger a second swallow. Direct visualization of the larynx shows edema of false vocal folds and bilateral ulcerations of the posterior vocal folds and subglottic tissues in the shape of the ET tube. Pt did have laryngeal sensation of scope touched to arytenoids, but cough response is impaired; there is glottic closure but impaired force (question neurogenic impairment). Primary concern for aspiration is delayed swallow initiation with large nectar thick boluses penetrating the airway before the swallow and resulting in trace accumulation of aspirated liquid. Risk is increased by impaired cough response and inability to follow commands due to global aphasia. Strength is otherwise WNL and pt tolerated puree and honey thick liquids without residue or penetration. Expect function to improve with more time post extubation.  Recommend a dys 1/honey thick diet with possible advancement with a f/u objective test.   SLP Visit Diagnosis Dysphagia, oropharyngeal phase (R13.12)  Attention and concentration deficit following --  Frontal lobe and executive function deficit following Cerebral infarction  Impact on safety and function Moderate aspiration risk      CHL IP TREATMENT RECOMMENDATION 05/21/2017  Treatment Recommendations Therapy as outlined in treatment plan below     Prognosis 05/21/2017  Prognosis for Safe Diet Advancement Fair  Barriers to Reach Goals Language deficits;Severity of deficits  Barriers/Prognosis Comment --    CHL IP DIET RECOMMENDATION 05/21/2017  SLP Diet Recommendations Dysphagia 1 (Puree) solids;Honey thick liquids  Liquid  Administration via Cup  Medication  Administration Crushed with puree  Compensations Slow rate;Small sips/bites  Postural Changes Seated upright at 90 degrees      CHL IP OTHER RECOMMENDATIONS 05/21/2017  Recommended Consults --  Oral Care Recommendations Oral care BID  Other Recommendations Order thickener from pharmacy;Have oral suction available      CHL IP FOLLOW UP RECOMMENDATIONS 05/21/2017  Follow up Recommendations Inpatient Rehab      CHL IP FREQUENCY AND DURATION 05/21/2017  Speech Therapy Frequency (ACUTE ONLY) min 2x/week  Treatment Duration 2 weeks           CHL IP ORAL PHASE 05/21/2017  Oral Phase Impaired  Oral - Pudding Teaspoon --  Oral - Pudding Cup --  Oral - Honey Teaspoon --  Oral - Honey Cup Decreased bolus cohesion;Delayed oral transit;Lingual/palatal residue  Oral - Nectar Teaspoon --  Oral - Nectar Cup Decreased bolus cohesion;Delayed oral transit;Lingual/palatal residue  Oral - Nectar Straw NT  Oral - Thin Teaspoon --  Oral - Thin Cup --  Oral - Thin Straw --  Oral - Puree Decreased bolus cohesion;Delayed oral transit;Lingual/palatal residue  Oral - Mech Soft Decreased bolus cohesion;Delayed oral transit;Lingual/palatal residue;Right pocketing in lateral sulci  Oral - Regular --  Oral - Multi-Consistency --  Oral - Pill --  Oral Phase - Comment --    CHL IP PHARYNGEAL PHASE 05/21/2017  Pharyngeal Phase Impaired  Pharyngeal- Pudding Teaspoon --  Pharyngeal --  Pharyngeal- Pudding Cup --  Pharyngeal --  Pharyngeal- Honey Teaspoon --  Pharyngeal --  Pharyngeal- Honey Cup Delayed swallow initiation-vallecula  Pharyngeal --  Pharyngeal- Nectar Teaspoon --  Pharyngeal --  Pharyngeal- Nectar Cup Delayed swallow initiation-vallecula;Delayed swallow initiation-pyriform sinuses;Penetration/Aspiration before swallow;Penetration/Aspiration during swallow;Trace aspiration  Pharyngeal Material enters airway, passes BELOW cords without attempt by patient to eject out (silent aspiration)   Pharyngeal- Nectar Straw NT  Pharyngeal --  Pharyngeal- Thin Teaspoon --  Pharyngeal --  Pharyngeal- Thin Cup --  Pharyngeal --  Pharyngeal- Thin Straw --  Pharyngeal --  Pharyngeal- Puree --  Pharyngeal --  Pharyngeal- Mechanical Soft Delayed swallow initiation-vallecula  Pharyngeal --  Pharyngeal- Regular --  Pharyngeal --  Pharyngeal- Multi-consistency --  Pharyngeal --  Pharyngeal- Pill --  Pharyngeal --  Pharyngeal Comment --     No flowsheet data found.  No flowsheet data found. Harlon Ditty, MA CCC-SLP [phone]  Claudine Mouton 05/21/2017, 12:26 PM

## 2017-05-21 NOTE — Evaluation (Signed)
Clinical/Bedside Swallow Evaluation Patient Details  Name: Henry David MRN: [id number] Date of Birth: 11-Feb-1961  Today's Date: 05/21/2017 Time: SLP Start Time (ACUTE ONLY): 4098 SLP Stop Time (ACUTE ONLY): 0848 SLP Time Calculation (min) (ACUTE ONLY): 26 min  Past Medical History:  Past Medical History:  Diagnosis Date  . Anxiety   . Apical mural thrombus   . BPH (benign prostatic hyperplasia)   . Depression   . Diverticulitis   . Elevated cholesterol   . Essential hypertension   . Post traumatic stress disorder   . STEMI (ST elevation myocardial infarction) [facility])    Past Surgical History:  Past Surgical History:  Procedure Laterality Date  . CARDIAC CATHETERIZATION N/A 07/09/2015   Procedure: Left Heart Cath and Coronary Angiography;  Surgeon: Lennette Bihari, MD;  Location: [facility] INVASIVE CV LAB;  Service: Cardiovascular;  Laterality: N/A;  . CARDIAC CATHETERIZATION N/A 07/09/2015   Procedure: Coronary Stent Intervention;  Surgeon: Lennette Bihari, MD;  Location: [facility] INVASIVE CV LAB;  Service: Cardiovascular;  Laterality: N/A;  . CARDIAC CATHETERIZATION N/A 07/11/2015   Procedure: Coronary Stent Intervention;  Surgeon: Lennette Bihari, MD;  Location: [facility] INVASIVE CV LAB;  Service: Cardiovascular;  Laterality: N/A;  . KNEE SURGERY     HPI:  Henry David a 56 y.o.malewith history of anxiety, depression, diverticulitis, GERD, cocaine use,history of apical mural thrombus, hypertension, hyperlipidemia and coronary artery diseasepresenting with global aphasia, left gaze preference and right hemiplegia. Hedidnot receive IV t-PA due to late presentation. Intubated 10/5-10/9. Head MRI 10/10 shows large evolving acute ischemic infarct involving essentially the entirety of the left MCA territory, similar to previous exams and associated mass effect with 12 mm of left-to-right midline shift, worsened relative to most recent CT from 05/18/2017.    Assessment / Plan /  Recommendation Clinical Impression  Pt demonstrates severe oropharyngeal dysphagia characterized by poor control/awareness of bolus, immediate cough following thin liquid and anterior spillage.  Though pt was alert and attended to BSE, his aspiration risk is exacerbated by global aphasia, inability to elicit volitional cough, recent extubation and inconsistent weak reflexive cough; suspect decreased airway protection and decreased swallow sensation. Objective swallow study warranted; will f/u with FEES this afternoon.       Aspiration Risk  Severe aspiration risk    Diet Recommendation NPO   Medication Administration: Via alternative means    Other  Recommendations Oral Care Recommendations: Oral care QID   Follow up Recommendations Skilled Nursing facility      Frequency and Duration min 2x/week  2 weeks       Prognosis Prognosis for Safe Diet Advancement: Fair Barriers to Reach Goals: Language deficits;Severity of deficits      Swallow Study   General HPI: Henry David a 56 y.o.malewith history of anxiety, depression, diverticulitis, GERD, cocaine use,history of apical mural thrombus, hypertension, hyperlipidemia and coronary artery diseasepresenting with global aphasia, left gaze preference and right hemiplegia. Hedidnot receive IV t-PA due to late presentation. Intubated 10/5-10/9. Head MRI 10/10 shows large evolving acute ischemic infarct involving essentially the entirety of the left MCA territory, similar to previous exams and associated mass effect with 12 mm of left-to-right midline shift, worsened relative to most recent CT from 05/18/2017.  Type of Study: Bedside Swallow Evaluation Diet Prior to this Study: NPO Temperature Spikes Noted: Yes Respiratory Status: Nasal cannula History of Recent Intubation: Yes Length of Intubations (days): 4 days Date extubated: 05/20/17 Behavior/Cognition: Alert;Agitated;Requires cueing;Doesn't follow  directions;Cooperative Oral Cavity Assessment: Within  Functional Limits Oral Care Completed by SLP: Recent completion by staff Oral Cavity - Dentition: Adequate natural dentition Vision: Impaired for self-feeding Self-Feeding Abilities: Total assist Patient Positioning: Upright in bed Baseline Vocal Quality: Not observed Volitional Cough: Cognitively unable to elicit Volitional Swallow: Unable to elicit    Oral/Motor/Sensory Function Overall Oral Motor/Sensory Function: Moderate impairment Facial ROM: Reduced right Facial Symmetry: Abnormal symmetry right Facial Strength: Reduced right Facial Sensation: Reduced right Mandible: Within Functional Limits   Ice Chips Ice chips: Impaired Presentation: Spoon Oral Phase Impairments: Reduced labial seal;Reduced lingual movement/coordination Oral Phase Functional Implications: Right anterior spillage Pharyngeal Phase Impairments: Wet Vocal Quality;Cough - Immediate   Thin Liquid Thin Liquid: Impaired Presentation: Cup Oral Phase Impairments: Reduced labial seal Oral Phase Functional Implications: Right anterior spillage Pharyngeal  Phase Impairments: Wet Vocal Quality    Nectar Thick Nectar Thick Liquid: Not tested   Honey Thick Honey Thick Liquid: Not tested   Puree Puree: Impaired Presentation: Spoon Oral Phase Functional Implications: Right lateral sulci pocketing;Oral residue   Solid   GO   Solid: Not tested        Carmela Rima, Student SLP 05/21/2017,9:18 AM

## 2017-05-21 NOTE — Progress Notes (Signed)
STROKE TEAM PROGRESS NOTE   SUBJECTIVE (INTERVAL HISTORY) No family members present. He is awake and moving spontaneously but remains completely aphasic. Blood pressure is adequately controlled. MRI showed no significant change and L MCA remains entirely occluded. He continues on 3% saline today in the ICU. Advancing diet per SLP evlauation.   OBJECTIVE Temp:  [98.6 F (37 C)-100.7 F (38.2 C)] 98.6 F (37 C) (10/10 1100) Pulse Rate:  [50-72] 57 (10/10 0800) Cardiac Rhythm: Sinus bradycardia (10/10 0800) Resp:  [18-29] 20 (10/10 0800) BP: (134-167)/(63-126) 162/65 (10/10 0800) SpO2:  [92 %-98 %] 94 % (10/10 0800)  CBC:   Recent Labs Lab 05/16/17 1537  05/18/17 0452  05/20/17 0534 05/21/17 0535  WBC 11.9*  --  11.2*  < > 9.7 9.0  NEUTROABS 10.5*  --  9.2*  --   --   --   HGB 13.7  < > 11.5*  < > 11.5* 11.4*  HCT 39.8  < > 35.6*  < > 35.6* 35.8*  MCV 83.8  --  86.8  < > 87.3 86.5  PLT 265  --  226  < > 250 241  < > = values in this interval not displayed.  Basic Metabolic Panel:   Recent Labs Lab 05/19/17 0450  05/20/17 0516 05/20/17 0534  05/21/17 0535 05/21/17 1204  NA 155*  < >  --  156*  < > 152* 150*  K 2.9*  < >  --  3.0*  --  3.2*  --   CL 126*  < >  --  124*  --  120*  --   CO2 23  < >  --  24  --  25  --   GLUCOSE 138*  < >  --  113*  --  99  --   BUN 16  < >  --  23*  --  15  --   CREATININE 0.81  < >  --  0.89  --  0.76  --   CALCIUM 8.0*  < >  --  8.3*  --  8.3*  --   MG 2.1  --  2.1  --   --   --   --   PHOS 2.1*  --  3.5  --   --   --   --   < > = values in this interval not displayed.  Lipid Panel:     Component Value Date/Time   CHOL 186 05/17/2017 0500   CHOL 114 12/12/2016 1118   TRIG 532 (H) 05/17/2017 0500   HDL 26 (L) 05/17/2017 0500   HDL 27 (L) 12/12/2016 1118   CHOLHDL 7.2 05/17/2017 0500   VLDL UNABLE TO CALCULATE IF TRIGLYCERIDE OVER 400 mg/dL 16/05/9603 5409   LDLCALC UNABLE TO CALCULATE IF TRIGLYCERIDE OVER 400 mg/dL 81/19/1478  2956   LDLCALC 40 12/12/2016 1118   HgbA1c:  Lab Results  Component Value Date   HGBA1C 6.1 (H) 05/17/2017   Urine Drug Screen:     Component Value Date/Time   LABOPIA NONE DETECTED 05/16/2017 1902   COCAINSCRNUR POSITIVE (A) 05/16/2017 1902   LABBENZ POSITIVE (A) 05/16/2017 1902   AMPHETMU NONE DETECTED 05/16/2017 1902   THCU NONE DETECTED 05/16/2017 1902   LABBARB NONE DETECTED 05/16/2017 1902    Alcohol Level     Component Value Date/Time   ETH <10 05/16/2017 1535    IMAGING [name] 05/20/2017 1. Large evolving acute ischemic infarct involving essentially the entirety of  the left MCA territory, similar to previous exams. Associated petechial hemorrhage without frank hemorrhagic transformation, most notable at the left lentiform nucleus. 2. Associated mass effect with 12 mm of left-to-right midline shift, worsened relative to most recent CT from 05/18/2017. No hydrocephalus or ventricular trapping.  [name] 05/20/2017 1. Occlusion of the left ICA at the level of the terminus with absent perfusion within the left middle cerebral artery. 2. Patent anterior communicating artery with perfusion of both anterior cerebral arteries, and minimal patchy retrograde flow within the left A1 segment. 3. Widely patent right middle cerebral artery and posterior Circulation.  Ct Head Wo Contrast 05/18/2017 Unchanged appearance of cytotoxic edema throughout the left MCA territory with unchanged rightward midline shift measuring 4 mm. No hemorrhage. No ventricular entrapment or hydrocephalus. 05/17/2017 Large acute infarct in the distribution of the left middle cerebral artery.  Since the previous study, there is increasing mass effect with increased effacement of the left lateral ventricle.  Mild developing left-to-right midline shift of about 4 mm.  No acute intracranial hemorrhage.    Ct Cerebral Perfusion W Contrast 05/16/2017 IMPRESSION:  Large left MCA  territory nonhemorrhagic infarct neck exceeds size criteria for intra-arterial intervention.  Ct Angio Head W Or Wo Contrast Ct Angio Neck W Or Wo Contrast 05/16/2017 IMPRESSION:  1. 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  2. No significant collateral flow to the left MCA territory.  3. Patent anterior communicating artery with retrograde filling of the left A1 segment.  4. Minimal atherosclerotic calcification within the cavernous right internal carotid artery.  5. No other significant atherosclerotic disease within the head or neck.  6. Degenerative changes of the cervical spine are most pronounced at C6-7.   Ct Head Code Stroke Wo Contrast 05/16/2017 IMPRESSION:  1. Large left MCA nonhemorrhagic infarct with minimal sparing of super ganglionic cortex.  2. Mass effect with effacement of the sulci and partial effacement of the left lateral ventricle.  3. ASPECTS is 10/10   Dg Chest [facility] 05/19/2017 Stable hypoinflation.  No pneumonia nor significant pulmonary edema. The support tubes are in reasonable position. 05/16/2017 1. Low lung volumes.  2. Satisfactory positioning of support apparatus as above.    Transthoracic Echocardiogram 05/17/2017 LVEF 45-50% Akinesis of the mid-apical anteroseptal, apical inferoseptal, and apical myocardium. Hypokinesis of the apical inferior myocardium.     PHYSICAL EXAM Vitals:   05/21/17 0630 05/21/17 0700 05/21/17 0800 05/21/17 1100  BP: (!) 167/81 (!) 164/126 (!) 162/65   Pulse: (!) 50 (!) 57 (!) 57   Resp: 18 (!) 21 20   Temp:   (!) 100.7 F (38.2 C) 98.6 F (37 C)  TempSrc:   Axillary   SpO2: 97% 97% 94%   Weight:      Height:        Physical exam: General - Awake, aphasic, not in distress  Cardiovascular - Regular rate and rhythm  Mental Status - Awake spontaneously and moving around  Cranial Nerves II - XII - II - Visual field left gaze  only, no response to visual threat from right III, IV, VI - Left gaze preference with intact spontaneous movements. VII - Right facial folds flattening VIII - Unable to follow commands, attends somewhat to general voice  Motor Strength - Left sided movements spontaneous at least 4/5 strength, right side hemiplegia   Motor Tone - Flaccid on RUE and RLE, normal LLE and  LUE  Reflexes - Withdraws LLE    ASSESSMENT/PLAN [name] is a 56 y.o. male with history of anxiety, depression, cocaine use, history of apical mural thrombus, hypertension, hyperlipidemia and coronary artery disease, presenting with aphasia and right sided weakness. He did not receive IV t-PA due to late presentation.  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 Mild developing left-to-right midline shift of about 4 mm.   MRI head - not performed.  MRA head - not performed  Carotid Doppler - CTA neck  2D Echo - LVEF 45% to 50%. Akinesis of the mid-apical anteroseptal, apical inferoseptal, and apical myocardium. Hypokinesis of the apical inferior myocardium.  LDL - 26  HgbA1c - 6.1  VTE prophylaxis DIET - DYS 1 Room service appropriate? Yes; Fluid consistency: Honey Thick  aspirin 81 mg daily and clopidogrel 75 mg daily prior to admission, now on ASA  and clopidogrel   Ongoing aggressive stroke risk factor management  Therapy recommendations:  - pending  Disposition:  Pending  Hypertension  Stable  Goal SBP <180  Long-term BP goal normotensive  Hyperlipidemia  Home meds:  Lipitor 80 mg daily - resumed in hospital  LDL 26, goal < 70  Continue statin at discharge  Diabetes  HgbA1c 6.1, goal < 7.0  No previous history of diabetes  Cerebral  Edema  Hypertonic saline - central line - Na 155 continued 3% at 14mL/hr  Neuro Surgery following  Other Stroke Risk Factors  Former cigarette smoker - quit  ETOH use, will be advised to drink no more than 1 drink per day.  Obesity, Body mass index is 31 kg/m., recommend weight loss, diet and exercise as appropriate   Coronary artery disease  Cocaine use  Left ICA occlusion  History of apical mural thrombus  Other Active Problems  Hypokalemia -> replacing  He continues to improve well considering large volume L MCA infarct. He is completely aphasic which may remain due to large L MCA infarct. Extubated today and starting a diet, hopefully he can advance. Hypertonic saline continue today (Day 5) then we will start weaning tomorrow. Blood pressure can start adjusting to normal goal under 180 SBP today. Although minimal swelling favors chronic vasculopathy, If he keeps improving TEE end of week or later to exclude cardiac source.  Hospital day # 5  Fuller Plan, MD PGY-III Internal Medicine Resident Pager# [phone] 05/21/2017, 2:10 PM    To contact Stroke Continuity provider, please refer to [email or web address]. After hours, contact General Neurology

## 2017-05-22 ENCOUNTER — Encounter (HOSPITAL_COMMUNITY): Payer: Self-pay

## 2017-05-22 LAB — GLUCOSE, CAPILLARY
GLUCOSE-CAPILLARY: 97 mg/dL (ref 65–99)
GLUCOSE-CAPILLARY: 97 mg/dL (ref 65–99)
Glucose-Capillary: 95 mg/dL (ref 65–99)
Glucose-Capillary: 111 mg/dL — ABNORMAL HIGH (ref 65–99)

## 2017-05-22 LAB — BASIC METABOLIC PANEL
ANION GAP: 6 (ref 5–15)
BUN: 14 mg/dL (ref 6–20)
CALCIUM: 7.7 mg/dL — AB (ref 8.9–10.3)
CO2: 22 mmol/L (ref 22–32)
Chloride: 120 mmol/L — ABNORMAL HIGH (ref 101–111)
Creatinine, Ser: 0.71 mg/dL (ref 0.61–1.24)
GFR calc non Af Amer: >60 mL/min (ref >60)
Glucose, Bld: 90 mg/dL (ref 65–99)
Potassium: 3 mmol/L — ABNORMAL LOW (ref 3.5–5.1)
Sodium: 148 mmol/L — ABNORMAL HIGH (ref 135–145)

## 2017-05-22 LAB — CBC
HCT: 39.3 % (ref 39.0–52.0)
HEMOGLOBIN: 12.4 g/dL — AB (ref 13.0–17.0)
MCH: 27.9 pg (ref 26.0–34.0)
MCHC: 31.6 g/dL (ref 30.0–36.0)
MCV: 88.5 fL (ref 78.0–100.0)
PLATELETS: 206 10*3/uL (ref 150–400)
RBC: 4.44 MIL/uL (ref 4.22–5.81)
RDW: 15.1 % (ref 11.5–15.5)
WBC: 9.5 10*3/uL (ref 4.0–10.5)

## 2017-05-22 LAB — SODIUM
SODIUM: 143 mmol/L (ref 135–145)
Sodium: 147 mmol/L — ABNORMAL HIGH (ref 135–145)

## 2017-05-22 MED ORDER — SODIUM CHLORIDE 3 % IV SOLN: INTRAVENOUS | Status: AC

## 2017-05-22 MED ORDER — VITAMIN B-1 100 MG PO TABS
100.0000 mg | ORAL_TABLET | Freq: Every day | ORAL | Status: DC
  Administered 2017-05-24 – 2017-06-03 (×8): 100 mg via ORAL
  Filled 2017-05-22 (×9): qty 1

## 2017-05-22 MED ORDER — POTASSIUM CHLORIDE 20 MEQ/15ML (10%) PO SOLN
40.0000 meq | Freq: Once | ORAL | Status: AC
  Administered 2017-05-22: 40 meq
  Filled 2017-05-22: qty 30

## 2017-05-22 MED ORDER — RISPERIDONE 0.5 MG PO TABS
0.5000 mg | ORAL_TABLET | Freq: Two times a day (BID) | ORAL | Status: DC
  Administered 2017-05-22: 0.5 mg via ORAL
  Filled 2017-05-22 (×3): qty 1

## 2017-05-22 MED ORDER — POTASSIUM CHLORIDE 10 MEQ/50ML IV SOLN
10.0000 meq | INTRAVENOUS | Status: AC
  Administered 2017-05-22 (×6): 10 meq via INTRAVENOUS
  Filled 2017-05-22 (×7): qty 50

## 2017-05-22 MED ORDER — DEXMEDETOMIDINE HCL IN NACL 200 MCG/50ML IV SOLN
0.4000 ug/kg/h | INTRAVENOUS | Status: DC
  Administered 2017-05-22 (×2): 0.2 ug/kg/h via INTRAVENOUS
  Administered 2017-05-22: 0.3 ug/kg/h via INTRAVENOUS
  Administered 2017-05-23: 0.2 ug/kg/h via INTRAVENOUS
  Administered 2017-05-23: 0.3 ug/kg/h via INTRAVENOUS
  Administered 2017-05-24: 0.5 ug/kg/h via INTRAVENOUS
  Administered 2017-05-24: 0.3 ug/kg/h via INTRAVENOUS
  Administered 2017-05-24 – 2017-05-25 (×3): 0.4 ug/kg/h via INTRAVENOUS
  Administered 2017-05-25 (×2): 0.6 ug/kg/h via INTRAVENOUS
  Administered 2017-05-25: 1 ug/kg/h via INTRAVENOUS
  Administered 2017-05-25 – 2017-05-26 (×2): 0.6 ug/kg/h via INTRAVENOUS
  Administered 2017-05-26: 0.8 ug/kg/h via INTRAVENOUS
  Administered 2017-05-26: 1 ug/kg/h via INTRAVENOUS
  Administered 2017-05-26: 0.6 ug/kg/h via INTRAVENOUS
  Administered 2017-05-26: 0.5 ug/kg/h via INTRAVENOUS
  Administered 2017-05-26: 1.2 ug/kg/h via INTRAVENOUS
  Administered 2017-05-27 (×2): 0.6 ug/kg/h via INTRAVENOUS
  Administered 2017-05-27: 0.8 ug/kg/h via INTRAVENOUS
  Filled 2017-05-22 (×25): qty 50

## 2017-05-22 MED ORDER — MIDAZOLAM HCL 2 MG/2ML IJ SOLN
1.0000 mg | INTRAMUSCULAR | Status: DC | PRN
  Administered 2017-05-22 – 2017-05-26 (×8): 2 mg via INTRAVENOUS
  Filled 2017-05-22 (×7): qty 2

## 2017-05-22 MED ORDER — FOLIC ACID 1 MG PO TABS
1.0000 mg | ORAL_TABLET | Freq: Every day | ORAL | Status: DC
  Administered 2017-05-24 – 2017-06-02 (×6): 1 mg via ORAL
  Filled 2017-05-22 (×8): qty 1

## 2017-05-22 MED ORDER — PROSIGHT PO TABS
1.0000 | ORAL_TABLET | Freq: Every day | ORAL | Status: DC
  Administered 2017-05-22 – 2017-06-03 (×8): 1 via ORAL
  Filled 2017-05-22 (×13): qty 1

## 2017-05-22 MED ORDER — SODIUM CHLORIDE 0.9 % IV SOLN
INTRAVENOUS | Status: DC
  Administered 2017-05-22 – 2017-05-24 (×4): via INTRAVENOUS
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
## 2017-05-22 NOTE — Progress Notes (Signed)
STROKE TEAM PROGRESS NOTE   SUBJECTIVE (INTERVAL HISTORY) No family members present. He became significantly more agitated and combative this morning requiring sedation. Put on precedex by CCM. Continues to show right sided hemiplegia and neglect and probably global aphasia. Hypertonic saline weaning after repeat head imaging fairly stable to pervious study.   OBJECTIVE Temp:  [98.3 F (36.8 C)-100.1 F (37.8 C)] 100 F (37.8 C) (10/11 1215) Pulse Rate:  [54-163] 59 (10/11 1300) Cardiac Rhythm: Normal sinus rhythm (10/11 0400) Resp:  [16-36] 22 (10/11 1100) BP: (132-174)/(68-102) 154/90 (10/11 1300) SpO2:  [83 %-100 %] 90 % (10/11 1300)  CBC:   Recent Labs Lab 05/16/17 1537  05/18/17 0452  05/21/17 0535 05/22/17 0430  WBC 11.9*  --  11.2*  < > 9.0 9.5  NEUTROABS 10.5*  --  9.2*  --   --   --   HGB 13.7  < > 11.5*  < > 11.4* 12.4*  HCT 39.8  < > 35.6*  < > 35.8* 39.3  MCV 83.8  --  86.8  < > 86.5 88.5  PLT 265  --  226  < > 241 206  < > = values in this interval not displayed.  Basic Metabolic Panel:   Recent Labs Lab 05/19/17 0450  05/20/17 0516  05/21/17 0535  05/22/17 0530 05/22/17 1300  NA 155*  < >  --   < > 152*  < > 148* 147*  K 2.9*  < >  --   < > 3.2*  --  3.0*  --   CL 126*  < >  --   < > 120*  --  120*  --   CO2 23  < >  --   < > 25  --  22  --   GLUCOSE 138*  < >  --   < > 99  --  90  --   BUN 16  < >  --   < > 15  --  14  --   CREATININE 0.81  < >  --   < > 0.76  --  0.71  --   CALCIUM 8.0*  < >  --   < > 8.3*  --  7.7*  --   MG 2.1  --  2.1  --   --   --   --   --   PHOS 2.1*  --  3.5  --   --   --   --   --   < > = values in this interval not displayed.  Lipid Panel:     Component Value Date/Time   CHOL 186 05/17/2017 0500   CHOL 114 12/12/2016 1118   TRIG 532 (H) 05/17/2017 0500   HDL 26 (L) 05/17/2017 0500   HDL 27 (L) 12/12/2016 1118   CHOLHDL 7.2 05/17/2017 0500   VLDL UNABLE TO CALCULATE IF TRIGLYCERIDE OVER 400 mg/dL 16/05/9603 5409    LDLCALC UNABLE TO CALCULATE IF TRIGLYCERIDE OVER 400 mg/dL 81/19/1478 2956   LDLCALC 40 12/12/2016 1118   HgbA1c:  Lab Results  Component Value Date   HGBA1C 6.1 (H) 05/17/2017   Urine Drug Screen:     Component Value Date/Time   LABOPIA NONE DETECTED 05/16/2017 1902   COCAINSCRNUR POSITIVE (A) 05/16/2017 1902   LABBENZ POSITIVE (A) 05/16/2017 1902   AMPHETMU NONE DETECTED 05/16/2017 1902   THCU NONE DETECTED 05/16/2017 1902   LABBARB NONE DETECTED 05/16/2017 1902    Alcohol Level  Component Value Date/Time   ETH <10 05/16/2017 1535    IMAGING I have personally reviewed the radiological images below and agree with the radiology interpretations.  [name] 05/21/2017 1. Large evolving acute ischemic infarct involving essentially the entirety of the left MCA territory, similar to previous exams. Associated petechial hemorrhage without frank hemorrhagic transformation, most notable at the left lentiform nucleus. 2. Associated mass effect with 12 mm of left-to-right midline shift, worsened relative to most recent CT from 05/18/2017. No hydrocephalus or ventricular trapping.  [name] 05/21/2017 1. Occlusion of the left ICA at the level of the terminus with absent perfusion within the left middle cerebral artery. 2. Patent anterior communicating artery with perfusion of both anterior cerebral arteries, and minimal patchy retrograde flow within the left A1 segment. 3. Widely patent right middle cerebral artery and posterior Circulation.  Ct Head Wo Contrast 05/18/2017 Unchanged appearance of cytotoxic edema throughout the left MCA territory with unchanged rightward midline shift measuring 4 mm. No hemorrhage. No ventricular entrapment or hydrocephalus. 05/17/2017 Large acute infarct in the distribution of the left middle cerebral artery.  Since the previous study, there is increasing mass effect with increased effacement of the left lateral  ventricle.  Mild developing left-to-right midline shift of about 4 mm.  No acute intracranial hemorrhage.    Ct Cerebral Perfusion W Contrast 05/16/2017 IMPRESSION:  Large left MCA territory nonhemorrhagic infarct neck exceeds size criteria for intra-arterial intervention.  Ct Angio Head W Or Wo Contrast Ct Angio Neck W Or Wo Contrast 05/16/2017 IMPRESSION:  1. 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  2. No significant collateral flow to the left MCA territory.  3. Patent anterior communicating artery with retrograde filling of the left A1 segment.  4. Minimal atherosclerotic calcification within the cavernous right internal carotid artery.  5. No other significant atherosclerotic disease within the head or neck.  6. Degenerative changes of the cervical spine are most pronounced at C6-7.   Ct Head Code Stroke Wo Contrast 05/16/2017 IMPRESSION:  1. Large left MCA nonhemorrhagic infarct with minimal sparing of super ganglionic cortex.  2. Mass effect with effacement of the sulci and partial effacement of the left lateral ventricle.  3. ASPECTS is 10/10   Dg Chest [facility] 05/19/2017 Stable hypoinflation.  No pneumonia nor significant pulmonary edema. The support tubes are in reasonable position. 05/16/2017 1. Low lung volumes.  2. Satisfactory positioning of support apparatus as above.   Transthoracic Echocardiogram 05/17/2017 LVEF 45-50% Akinesis of the mid-apical anteroseptal, apical inferoseptal, and apical myocardium. Hypokinesis of the apical inferior myocardium.    PHYSICAL EXAM Vitals:   05/22/17 1100 05/22/17 1200 05/22/17 1215 05/22/17 1300  BP: (!) 144/81 132/68  (!) 154/90  Pulse: 80 62  (!) 59  Resp: (!) 22     Temp:   100 F (37.8 C)   TempSrc:   Axillary   SpO2: 95% 98%  90%  Weight:      Height:        Physical exam: General - Awake, global aphasic, agitated  Mental Status  - Awake spontaneously and moving around at left side, unable to answer questions or follow commands  Cranial Nerves II - XII - II - Visual field left gaze only, no response to visual threat from right III, IV, VI - Left gaze preference but able to cross midline. VII - Right facial droop XII -  tongue midline  Motor Strength - Left sided movements spontaneous at least 4/5 strength, in restraint, right side hemiplegia   Motor Tone - Flaccid on RUE and RLE, normal LLE and LUE  Reflexes - Positive babinski on R  Sensation, coordination and gait not able to test.   ASSESSMENT/PLAN Mr. DAL BLEW is a 56 y.o. male with history of anxiety, depression, cocaine use, history of apical mural thrombus, hypertension, hyperlipidemia and coronary artery disease, presenting with aphasia and right sided weakness. He did not receive IV t-PA due to late presentation.  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 Mild developing left-to-right midline shift of about 4 mm.   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  MRI head - left malignant MCA infarct with midline shift about 10mm  MRA head - left ICA terminus persistent occlusion, no left MCA visualized   2D Echo - LVEF 45% to 50%  LDL - 26  HgbA1c - 6.1  VTE prophylaxis DIET - DYS 1 Room service appropriate? Yes; Fluid consistency: Honey Thick  aspirin 81 mg daily and clopidogrel 75 mg daily prior to admission, now on ASA  and clopidogrel   Ongoing aggressive stroke risk factor management  Therapy recommendations:  - pending  Disposition:  Pending  Cerebral Edema  CT and MRI showed  midline shift up to 10mm  Hypertonic saline - rate down to 51mL/hr, weaning off over next 24hr  Na 149 this am  D/c Na Q6h  Hx of apical thrombus  12/2015 TTE showed an apical thrombus on definity evaluation, EF 40-45%  Started on coumadin in 12/2015  Repeat TTE 06/2016 EF 40-45% no evidence of thrombus  Coumadin stopped on 07/2016  Recommend TEE to evaluate apical thrombus given the current possible embolic stroke   Delirium  Pt agitated and encephalopathic this am  Concerning for substance vs. Alcohol withdraw  On precedex  On FA/B1/MVI  CCM on board  Hypertension  Stable  Goal SBP <180, gradually decrease to goal in 5-7 days.   Long-term BP goal 130-150 due to left ICA occlusion  Hyperlipidemia  Home meds:  Lipitor 80 mg daily - resumed in hospital  LDL 26, goal < 70  Continue statin at discharge  Cocaine abuse  UDS positive for cocaine  Cessation counseling will be provided  Other Stroke Risk Factors  Former cigarette smoker - quit  ETOH use, will be advised to drink no more than 1 drink per day.  Obesity, Body mass index is 31 kg/m., recommend weight loss, diet and exercise as appropriate   Coronary artery disease s/p stent on DAPT  Other Active Problems  Hypokalemia -> replacing   Hospital day # 6  Fuller Plan, MD PGY-III Internal Medicine Resident Pager# [phone] 05/22/2017, 2:20 PM   I reviewed above note and agree with the assessment and plan.  Now on sedation agree likely delirium 2/2 substance Hx or maybe ICU/intubation. Thiamine/folate added with unclear exact substance use Hx. 3% saline decreased to 36mL/hr this morning and will wean to 53mL/hr today PM and d/c tomorrow. Resume routine daily BMP monitoring. Cardiology contacted to schedule TEE when available maybe Friday vs  Monday. After this if remaining neurologically stable he could transfer from ICU when mental status appropriate.  This patient is critically ill  due to left malignant MCA, cerebral edema, history of apical mural thrombus and at significant risk of neurological worsening, death form brain herniation, heart failure, recurrent stroke, seizure. This patient's care requires constant monitoring of vital signs, hemodynamics, respiratory and cardiac monitoring, review of multiple databases, neurological assessment, discussion with family, other specialists and medical decision making of high complexity. I spent 40 minutes of neurocritical care time in the care of this patient.  Marvel Plan, MD PhD Stroke Neurology 05/22/2017 3:57 PM     To contact Stroke Continuity provider, please refer to [email or web address]. After hours, contact General Neurology

## 2017-05-22 NOTE — Progress Notes (Signed)
PT Cancellation Note  Patient Details Name: Henry David MRN: [id number] DOB: 26-Oct-1960   Cancelled Treatment:    Reason Eval/Treat Not Completed: Other (comment).  See OT note.   PT will check back tomorrow.  Thanks,    Rollene Rotunda. Hiawatha Merriott, PT, DPT [phone]   05/22/2017, 12:00 PM

## 2017-05-22 NOTE — Progress Notes (Signed)
Pt swatting and kicking staff and trying to fling self off bed. Discussed with MD. Will place in left soft wrist and soft waist restraint and add precedex. Will monitor closely.

## 2017-05-22 NOTE — Progress Notes (Signed)
  Speech Language Pathology Treatment: Dysphagia;Cognitive-Linquistic  Patient Details Name: CHRISTOVAL HECKMANN MRN: [id number] DOB: [birth year] Today's Date: 05/22/2017 Time: 9628-3662 SLP Time Calculation (min) (ACUTE ONLY): 31 min  Assessment / Plan / Recommendation Clinical Impression  Using a total communication approach, pt's expressive and receptive language subjectively improved since last SLP visit. Suspect that receptive language is limited to facial expression, gestures and context cues with impaired understanding of spoken language persisting. Given max verbal/tactile/contextual cues, pt was able to approximate gestures with symbolic meaning including the middle finger, communicating "no" by waving food items away, and communicating "more" by holding out an open hand towards preferred food choices. When re-applying safety mits, pt demonstrated dislike for the situation by pulling his hand away and physically moving the mit aside. Inability for pt to phonate persists despite max verbal/modeling cues. Will continue to follow for functional communication of wants/needs.   Pt observed with PO trials of a.m. meal; able to tolerate honey-thick and puree consistencies with no overt s/sx of aspiration however, mild oral residue noted. Ice chips attempted given pt's refusal of most of his meal; delayed cough noted following ice chips. Pt still unable to follow commands or elicit volitional cough to clear secretions despite max verbal/modeling cues; weak reflexive cough persists. SLP hesitatant to advance diet until swallow function and/or communicative ability improves. Recommend continuation of Dys 1 (puree) diet with honey-thick liquids and meds crushed in puree.    HPI HPI: Garlin Haidet Amprazisis a 56 y.o.malewith history of anxiety, depression, diverticulitis, GERD, cocaine use,history of apical mural thrombus, hypertension, hyperlipidemia and coronary artery diseasepresenting with global aphasia,  left gaze preference and right hemiplegia. Hedidnot receive IV t-PA due to late presentation. Intubated 10/5-10/9. Head MRI 10/10 shows large evolving acute ischemic infarct involving essentially the entirety of the left MCA territory, similar to previous exams and associated mass effect with 12 mm of left-to-right midline shift, worsened relative to most recent CT from 05/18/2017.       SLP Plan  Continue with current plan of care;Goals updated       Recommendations  Diet recommendations: Dysphagia 1 (puree);Honey-thick liquid Liquids provided via: Cup Medication Administration: Crushed with puree Supervision: Full supervision/cueing for compensatory strategies Compensations: Slow rate;Small sips/bites                Oral Care Recommendations: Oral care QID Follow up Recommendations: Inpatient Rehab SLP Visit Diagnosis: Dysphagia, oropharyngeal phase (R13.12) Frontal lobe and executive function deficit following: Cerebral infarction Plan: Continue with current plan of care;Goals updated       GO                Carmela Rima, Student SLP 05/22/2017, 11:06 AM

## 2017-05-22 NOTE — Progress Notes (Signed)
OT Cancellation Note  Patient Details Name: Henry David MRN: [id number] DOB: 12-25-1960   Cancelled Treatment:    Reason Eval/Treat Not Completed: Patient not medically ready. Pt agitated, attempting to kick and hit nurses. Assisted nurse with rerstraining pt. Will attempt tomorrow if appropriate.   [facility] Yon Schiffman, OT/L  [phone] 05/22/2017 05/22/2017, 9:20 AM

## 2017-05-23 ENCOUNTER — Encounter (HOSPITAL_COMMUNITY): Payer: Self-pay | Admitting: Certified Registered Nurse Anesthetist

## 2017-05-23 ENCOUNTER — Inpatient Hospital Stay (HOSPITAL_COMMUNITY): Payer: Medicaid Other

## 2017-05-23 LAB — CBC
HEMATOCRIT: 37.6 % — AB (ref 39.0–52.0)
Hemoglobin: 12.8 g/dL — ABNORMAL LOW (ref 13.0–17.0)
MCH: 28.5 pg (ref 26.0–34.0)
MCHC: 34 g/dL (ref 30.0–36.0)
MCV: 83.7 fL (ref 78.0–100.0)
Platelets: 267 10*3/uL (ref 150–400)
RBC: 4.49 MIL/uL (ref 4.22–5.81)
RDW: 12.5 % (ref 11.5–15.5)
WBC: 9.5 10*3/uL (ref 4.0–10.5)

## 2017-05-23 LAB — BASIC METABOLIC PANEL
Anion gap: 8 (ref 5–15)
BUN: 15 mg/dL (ref 6–20)
CHLORIDE: 113 mmol/L — AB (ref 101–111)
CO2: 22 mmol/L (ref 22–32)
Calcium: 8.5 mg/dL — ABNORMAL LOW (ref 8.9–10.3)
Creatinine, Ser: 0.76 mg/dL (ref 0.61–1.24)
GFR calc Af Amer: >60 mL/min (ref >60)
GFR calc non Af Amer: >60 mL/min (ref >60)
GLUCOSE: 103 mg/dL — AB (ref 65–99)
POTASSIUM: 3.2 mmol/L — AB (ref 3.5–5.1)
Sodium: 143 mmol/L (ref 135–145)

## 2017-05-23 LAB — GLUCOSE, CAPILLARY
GLUCOSE-CAPILLARY: 102 mg/dL — AB (ref 65–99)
GLUCOSE-CAPILLARY: 85 mg/dL (ref 65–99)
Glucose-Capillary: 82 mg/dL (ref 65–99)
Glucose-Capillary: 85 mg/dL (ref 65–99)

## 2017-05-23 LAB — BLOOD GAS, ARTERIAL
ACID-BASE DEFICIT: 2.7 mmol/L — AB (ref 0.0–2.0)
BICARBONATE: 20.5 mmol/L (ref 20.0–28.0)
DRAWN BY: 511841
FIO2: 28
O2 Saturation: 98.2 %
PATIENT TEMPERATURE: 98.6
pCO2 arterial: 28.6 mmHg — ABNORMAL LOW (ref 32.0–48.0)
pH, Arterial: 7.469 — ABNORMAL HIGH (ref 7.350–7.450)
pO2, Arterial: 113 mmHg — ABNORMAL HIGH (ref 83.0–108.0)

## 2017-05-23 MED ORDER — BISACODYL 10 MG RE SUPP
10.0000 mg | Freq: Every day | RECTAL | Status: DC | PRN
  Administered 2017-05-24: 10 mg via RECTAL
  Filled 2017-05-23 (×2): qty 1

## 2017-05-23 MED ORDER — LORAZEPAM 2 MG/ML IJ SOLN
0.5000 mg | Freq: Two times a day (BID) | INTRAMUSCULAR | Status: DC
  Administered 2017-05-23: 0.5 mg via INTRAVENOUS
  Filled 2017-05-23: qty 1

## 2017-05-23 MED ORDER — BISACODYL 5 MG PO TBEC
5.0000 mg | DELAYED_RELEASE_TABLET | Freq: Two times a day (BID) | ORAL | Status: DC | PRN
  Filled 2017-05-23: qty 1

## 2017-05-23 MED ORDER — HALOPERIDOL LACTATE 5 MG/ML IJ SOLN
2.0000 mg | INTRAMUSCULAR | Status: DC | PRN
  Administered 2017-05-23: 2 mg via INTRAVENOUS
  Filled 2017-05-23: qty 1

## 2017-05-23 MED ORDER — CHLORHEXIDINE GLUCONATE CLOTH 2 % EX PADS: 6.0000 | MEDICATED_PAD | Freq: Every day | CUTANEOUS | Status: DC

## 2017-05-23 MED ORDER — POTASSIUM CHLORIDE 10 MEQ/50ML IV SOLN
10.0000 meq | INTRAVENOUS | Status: AC
  Administered 2017-05-23 (×4): 10 meq via INTRAVENOUS
  Filled 2017-05-23 (×4): qty 50

## 2017-05-23 MED ORDER — FAMOTIDINE IN NACL 20-0.9 MG/50ML-% IV SOLN
20.0000 mg | Freq: Two times a day (BID) | INTRAVENOUS | Status: DC
  Administered 2017-05-23 (×2): 20 mg via INTRAVENOUS
  Filled 2017-05-23 (×2): qty 50

## 2017-05-23 MED ORDER — RISPERIDONE 1 MG PO TABS
1.0000 mg | ORAL_TABLET | Freq: Two times a day (BID) | ORAL | Status: DC
  Administered 2017-05-24: 1 mg via ORAL
  Filled 2017-05-23 (×4): qty 1

## 2017-05-23 MED ORDER — HALOPERIDOL LACTATE 5 MG/ML IJ SOLN: 1.0000 mg | INTRAMUSCULAR | Status: DC | PRN

## 2017-05-23 MED ORDER — LORAZEPAM 2 MG/ML IJ SOLN
1.0000 mg | Freq: Four times a day (QID) | INTRAMUSCULAR | Status: DC
  Administered 2017-05-23 – 2017-05-24 (×3): 1 mg via INTRAVENOUS
  Filled 2017-05-23 (×3): qty 1

## 2017-05-23 NOTE — Progress Notes (Signed)
PT Cancellation Note  Patient Details Name: Henry David MRN: [id number] DOB: [birth year]   Cancelled Treatment:    Reason Eval/Treat Not Completed: Other (comment).  See OT note, pt remains combative.   Thanks,    Rollene Rotunda. [name], PT, DPT [phone]   05/23/2017, 1:48 PM

## 2017-05-23 NOTE — Progress Notes (Signed)
OT Cancellation Note  Patient Details Name: Henry David MRN: [id number] DOB: 1961/02/27   Cancelled Treatment:    Reason Eval/Treat Not Completed: Other (comment). Pt remains combative. Will assess when more appropriate.   [facility] Obrien Huskins, OT/L  [phone] 05/23/2017 05/23/2017, 1:30 PM

## 2017-05-23 NOTE — Progress Notes (Signed)
SLP Cancellation Note  Patient Details Name: Henry David MRN: [id number] DOB: 02-22-1961   Cancelled treatment:       Reason Eval/Treat Not Completed: Fatigue/lethargy limiting ability to participate - pt sedated per RN. Will f/u as able.   [name] 05/23/2017, 3:59 PM  [name], M.A. CCC-SLP [phone]

## 2017-05-23 NOTE — Progress Notes (Signed)
Pt combative, swinging and kicking at staff.

## 2017-05-23 NOTE — Progress Notes (Signed)
PULMONARY / CRITICAL CARE MEDICINE   Name: Henry David MRN: [id number] DOB: 05/16/1961    ADMISSION DATE:  05/16/2017   CHIEF COMPLAINT:  Found Down  HISTORY OF PRESENT ILLNESS:   56 yo male found unresponsive and had large MCA infarct with occlusion of Lt carotid.  He was not a candidate for tPA or endovascular intervention. UDS positive for cocaine.  PMHx of CAD.  SUBJECTIVE:  Remains on precedex  VITAL SIGNS: BP (!) 158/85 (BP Location: Right Arm)   Pulse (!) 53   Temp 98.2 F (36.8 C) (Oral)   Resp 20   Ht  (1.778 m)   Wt 98 kg (216 lb 0.8 oz)   SpO2 95%   BMI 31.00 kg/m   VENTILATOR SETTINGS:  N/A  INTAKE / OUTPUT: I/O last 3 completed shifts: In: 2062.5 [P.O.:360; I.V.:1702.5] Out: 3705 [Urine:3705]  PHYSICAL EXAMINATION:  General: on precedex calm Neuro: int fc, some upper ext left follows , limited lower HEENT: air way controlled, no ronchi PULM: CTA CV:  s1 s2 RR mild brady GI: soft, bs wnl, no r Extremities: edema min  LABS:  BMET  Recent Labs Lab 05/21/17 0535  05/22/17 0530 05/22/17 1300 05/23/17 0525  NA 152*  < > 148* 147* 143  K 3.2*  --  3.0*  --  3.2*  CL 120*  --  120*  --  113*  CO2 25  --  22  --  22  BUN 15  --  14  --  15  CREATININE 0.76  --  0.71  --  0.76  GLUCOSE 99  --  90  --  103*  < > = values in this interval not displayed.  Electrolytes  Recent Labs Lab 05/18/17 0452 05/19/17 0450  05/20/17 0516  05/21/17 0535 05/22/17 0530 05/23/17 0525  CALCIUM 8.0* 8.0*  < >  --   < > 8.3* 7.7* 8.5*  MG 2.2 2.1  --  2.1  --   --   --   --   PHOS 1.6* 2.1*  --  3.5  --   --   --   --   < > = values in this interval not displayed.  CBC  Recent Labs Lab 05/21/17 0535 05/22/17 0430 05/23/17 0525  WBC 9.0 9.5 9.5  HGB 11.4* 12.4* 12.8*  HCT 35.8* 39.3 37.6*  PLT 241 206 267    Coag's  Recent Labs Lab 05/16/17 1537 05/17/17 0530  APTT 31  --   INR 1.11 1.00    ABG  Recent Labs Lab  05/16/17 1945  PHART 7.366  PCO2ART 41.1  PO2ART 383*    Liver Enzymes  Recent Labs Lab 05/16/17 1537  AST 32  ALT 36  ALKPHOS 87  BILITOT 0.6  ALBUMIN 3.7    Cardiac Enzymes No results for input(s): TROPONINI, PROBNP in the last 168 hours.  Glucose  Recent Labs Lab 05/21/17 2144 05/22/17 0721 05/22/17 1217 05/22/17 1620 05/22/17 2311 05/23/17 0759  GLUCAP 93 95 111* 97 97 82    Imaging No results found.  STUDIES:  CT Head 10/5 > Large left MCA nonhemorrhagic infarct with minimal sparing of super ganglionic cortex. Mass effect with effacement of the sulci and partial effacement of the left lateral ventricle. 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 CT Head 10/7> L MCA cytotoxic edema with 4 mm midline shift   CXR  10/8> Low lung volumes, ETT and CVC appropriate CXR 10/9> Persistent but improved low lung volumes, support devices in stable, appropriate position     Echo 10/6> EF 45-50%, areas of akinesis, no PFO or mural thrombus noted   MRI/MRA Head 10/10 > Evolving L MCA territory infarct with worsened edema, 12 mm midline shift, Occlusion of L ICA  CULTURES: MRSA PCR 10/5 > Negative   ANTIBIOTICS: None   SIGNIFICANT EVENTS: 10/5 - Presents to ED with large Left MCA CVA  10/6 - 3% NS 10/9- Extubated  10/10 enceph bad, precedex  LINES/TUBES: ETT 10/5 >> 10/9 Right Subclavian CVC 10/5 >> lie to dc this  ASSESSMENT / PLAN:  NEURO: 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 Cocaine abuse. Sever enceph , rule out wd Hx of depression, anxiety. - continue ASA, plavix - Risperdal consider increase, consider benzo add also -prn versed -some caution with high doses above with MCA stroke    PULM: Compromised airway d/t neuro status, extubated 10/9  -room air -protecting airway now  CV: Hypertension. Hx of  CAD, HLD. -consider further more aggressive correction BP this far out from stroke,  - lipitor 80  ENDOCRINE: DM - SSI -controlled  RENAL: Hypokalemia. Hypercloremia mca cva with edema prior - allow cl -avoid free water for now  GI:  Dysphagia d/t CN palsy - diet oral okay for now\ -dc ppi , add home pepcid  HEME: - DVT ppx- Lovenox until ambualtion -Involve PT  ID: Follow fever curve   Ccm time 30 min   Mcarthur Rossetti. Tyson Alias, MD, FACP Pgr: [phone] [place] [facility] 05/23/2017 8:46 AM

## 2017-05-23 NOTE — Progress Notes (Signed)
[facility] ADULT ICU REPLACEMENT PROTOCOL FOR AM LAB REPLACEMENT ONLY  The patient does apply for the [facility] Adult ICU Electrolyte Replacment Protocol based on the criteria listed below:   1. Is GFR >/= 40 ml/min? Yes.    Patient's GFR today is >60 2. Is urine output >/= 0.5 ml/kg/hr for the last 6 hours? Yes.   Patient's UOP is 1 ml/kg/hr 3. Is BUN < 60 mg/dL? Yes.    Patient's BUN today is 15 4. Abnormal electrolyte(s):K=3.2 5. Ordered repletion with: per protocol 6. If a panic level lab has been reported, has the CCM MD in charge been notified? Yes.  .   Physician:  Holland Commons, MD  Melrose Nakayama 05/23/2017 6:40 AM

## 2017-05-23 NOTE — Progress Notes (Signed)
Pt refused to take meds PO, becoming agitated.

## 2017-05-23 NOTE — Progress Notes (Signed)
Asked to assess the pt for resp distress, agitated delirium  Blood pressure (!) 132/95, pulse 93, temperature 99.7 F (37.6 C), temperature source Axillary, resp. rate (!) 22, height '5\' 10"'$  (1.778 m), weight 216 lb 0.8 oz (98 kg), SpO2 94 %. Gen:      No acute distress HEENT:  EOMI, sclera anicteric Neck:     No masses; no thyromegaly Lungs:    Clear to auscultation bilaterally; normal respiratory effort CV:         Regular rate and rhythm; no murmurs Abd:      + bowel sounds; soft, non-tender; no palpable masses, no distension Ext:    No edema; adequate peripheral perfusion Skin:      Warm and dry; no rash Neuro: alert and oriented x 3 Psych: Somnolent  ABG reviewed- Shows resp alk.   Assessment MCA infarct Severe encephalopathy, delirium  At time of my examination pt has received a dose of bezo and was sleeping No resp distress. Protecting airway No imminent need for intubation Continue benzos as ordered Add haldol prn, follow qtc Consider restart precedex if he becomes more agitated  The patient is critically ill with multiple organ system failure and requires high complexity decision making for assessment and support, frequent evaluation and titration of therapies, advanced monitoring, review of radiographic studies and interpretation of complex data.   Additional Critical Care Time devoted to patient care services, exclusive of separately billable procedures, described in this note is 35 minutes.   Marshell Garfinkel MD [place] [facility] Pager [phone] If no answer or after 3pm call: [phone] 05/23/2017, 6:42 PM

## 2017-05-23 NOTE — Care Management Note (Signed)
Case Management Note Original note by:  Elliot Cousin, RN 05/19/17 1532  Patient Details  Name: PATTON KOSS MRN: [id number] Date of Birth: Jan 23, 1961  Subjective/Objective:      Large left MCA nonhemorrhagic infarct    Action/Plan: Discharge Planning: Chart review. Pt lives in home with roommate, Bernell List # [phone]. Roommate found patient and call EMS. Waiting PT/OT recommendations when stable. Pt without health insurance. Will continue to follow for dc needs.   PCP- NONE   Expected Discharge Date:                  Expected Discharge Plan:  Skilled Nursing Facility  In-House Referral:  Clinical Social Work  Discharge planning Services  CM Consult  Post Acute Care Choice:    Choice offered to:     DME Arranged:    DME Agency:     HH Arranged:    HH Agency:     Status of Service:  In process, will continue to follow  If discussed at Long Length of Stay Meetings, dates discussed:    Additional Comments:  05/23/17 J. Amrutha Avera, RN, BSN Pt on Precedex drip for agitation.   PT/OT have been unable to work with pt due to combativeness.  Will continue to follow progress.   Quintella Baton, RN, BSN  Trauma/Neuro ICU Case Manager [phone]

## 2017-05-23 NOTE — Progress Notes (Signed)
STROKE TEAM PROGRESS NOTE   SUBJECTIVE (INTERVAL HISTORY) No family members present. RN at bedside. He is calm but still on precedex, plan to taper off today. Still has right hemiplegia and global aphasia. Did not eat well, on IVF @ 50cc. Added ativan 0.5mg  bid and risperdal 1mg  bid by CCM to taper off precedex.     OBJECTIVE Temp:  [97.1 F (36.2 C)-100.4 F (38 C)] 98.2 F (36.8 C) (10/12 0759) Pulse Rate:  [52-80] 56 (10/12 0900) Cardiac Rhythm: Sinus bradycardia (10/12 0800) Resp:  [17-27] 22 (10/12 0900) BP: (132-167)/(66-118) 136/118 (10/12 0900) SpO2:  [89 %-99 %] 95 % (10/12 0900)  CBC:   Recent Labs Lab 05/16/17 1537  05/18/17 0452  05/22/17 0430 05/23/17 0525  WBC 11.9*  --  11.2*  < > 9.5 9.5  NEUTROABS 10.5*  --  9.2*  --   --   --   HGB 13.7  < > 11.5*  < > 12.4* 12.8*  HCT 39.8  < > 35.6*  < > 39.3 37.6*  MCV 83.8  --  86.8  < > 88.5 83.7  PLT 265  --  226  < > 206 267  < > = values in this interval not displayed.  Basic Metabolic Panel:   Recent Labs Lab 05/19/17 0450  05/20/17 0516  05/22/17 0530 05/22/17 1300 05/23/17 0525  NA 155*  < >  --   < > 148* 147* 143  K 2.9*  < >  --   < > 3.0*  --  3.2*  CL 126*  < >  --   < > 120*  --  113*  CO2 23  < >  --   < > 22  --  22  GLUCOSE 138*  < >  --   < > 90  --  103*  BUN 16  < >  --   < > 14  --  15  CREATININE 0.81  < >  --   < > 0.71  --  0.76  CALCIUM 8.0*  < >  --   < > 7.7*  --  8.5*  MG 2.1  --  2.1  --   --   --   --   PHOS 2.1*  --  3.5  --   --   --   --   < > = values in this interval not displayed.  Lipid Panel:     Component Value Date/Time   CHOL 186 05/17/2017 0500   CHOL 114 12/12/2016 1118   TRIG 532 (H) 05/17/2017 0500   HDL 26 (L) 05/17/2017 0500   HDL 27 (L) 12/12/2016 1118   CHOLHDL 7.2 05/17/2017 0500   VLDL UNABLE TO CALCULATE IF TRIGLYCERIDE OVER 400 mg/dL 79/10/8331 8329   LDLCALC UNABLE TO CALCULATE IF TRIGLYCERIDE OVER 400 mg/dL 19/16/6060 0459   LDLCALC 40  12/12/2016 1118   HgbA1c:  Lab Results  Component Value Date   HGBA1C 6.1 (H) 05/17/2017   Urine Drug Screen:     Component Value Date/Time   LABOPIA NONE DETECTED 05/16/2017 1902   COCAINSCRNUR POSITIVE (A) 05/16/2017 1902   LABBENZ POSITIVE (A) 05/16/2017 1902   AMPHETMU NONE DETECTED 05/16/2017 1902   THCU NONE DETECTED 05/16/2017 1902   LABBARB NONE DETECTED 05/16/2017 1902    Alcohol Level     Component Value Date/Time   ETH <10 05/16/2017 1535    IMAGING I have personally reviewed the radiological images below and agree  with the radiology interpretations.  [name] 05/21/2017 1. Large evolving acute ischemic infarct involving essentially the entirety of the left MCA territory, similar to previous exams. Associated petechial hemorrhage without frank hemorrhagic transformation, most notable at the left lentiform nucleus. 2. Associated mass effect with 12 mm of left-to-right midline shift, worsened relative to most recent CT from 05/18/2017. No hydrocephalus or ventricular trapping.  [name] 05/21/2017 1. Occlusion of the left ICA at the level of the terminus with absent perfusion within the left middle cerebral artery. 2. Patent anterior communicating artery with perfusion of both anterior cerebral arteries, and minimal patchy retrograde flow within the left A1 segment. 3. Widely patent right middle cerebral artery and posterior Circulation.  Ct Head Wo Contrast 05/18/2017 Unchanged appearance of cytotoxic edema throughout the left MCA territory with unchanged rightward midline shift measuring 4 mm. No hemorrhage. No ventricular entrapment or hydrocephalus. 05/17/2017 Large acute infarct in the distribution of the left middle cerebral artery.  Since the previous study, there is increasing mass effect with increased effacement of the left lateral ventricle.  Mild developing left-to-right midline shift of about 4 mm.  No acute intracranial  hemorrhage.    Ct Cerebral Perfusion W Contrast 05/16/2017 IMPRESSION:  Large left MCA territory nonhemorrhagic infarct neck exceeds size criteria for intra-arterial intervention.  Ct Angio Head W Or Wo Contrast Ct Angio Neck W Or Wo Contrast 05/16/2017 IMPRESSION:  1. 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  2. No significant collateral flow to the left MCA territory.  3. Patent anterior communicating artery with retrograde filling of the left A1 segment.  4. Minimal atherosclerotic calcification within the cavernous right internal carotid artery.  5. No other significant atherosclerotic disease within the head or neck.  6. Degenerative changes of the cervical spine are most pronounced at C6-7.   Ct Head Code Stroke Wo Contrast 05/16/2017 IMPRESSION:  1. Large left MCA nonhemorrhagic infarct with minimal sparing of super ganglionic cortex.  2. Mass effect with effacement of the sulci and partial effacement of the left lateral ventricle.  3. ASPECTS is 10/10   Dg Chest [facility] 05/19/2017 Stable hypoinflation.  No pneumonia nor significant pulmonary edema. The support tubes are in reasonable position.  05/16/2017 1. Low lung volumes.  2. Satisfactory positioning of support apparatus as above.   05/23/17 1. Low lung volumes. 2. Mild pulmonary vascular congestion and small effusions may reflect neurogenic edema versus cardiac. 3. Minimal bibasilar airspace disease likely reflects atelectasis.  Transthoracic Echocardiogram 05/17/2017 LVEF 45-50% Akinesis of the mid-apical anteroseptal, apical inferoseptal, and apical myocardium. Hypokinesis of the apical inferior myocardium.    PHYSICAL EXAM Vitals:   05/23/17 0600 05/23/17 0759 05/23/17 0800 05/23/17 0900  BP: 135/88  (!) 158/85 (!) 136/118  Pulse: (!) 59  (!) 53 (!) 56  Resp: 19  20 (!) 22  Temp:  98.2 F (36.8 C)    TempSrc:  Oral     SpO2: 93%  95% 95%  Weight:      Height:        Physical exam: General - Awake, global aphasic  Mental Status - Awake, spontaneously and moving left side, unable to answer questions or follow commands  Cranial Nerves II - XII - II - no blinking to visual threat from right III, IV, VI - Left gaze preference but able to cross midline. VII - Right facial droop XII -  tongue midline  Motor Strength - Left sided movements spontaneous at least 4/5 strength, right UE mild withdraw on pain, RLE 2+/5 with pain stimulation   Motor Tone - Flaccid on RUE and RLE, normal LLE and LUE  Reflexes - Positive babinski on R  Sensation, coordination and gait not able to test.   ASSESSMENT/PLAN Mr. TEX CONROY is a 56 y.o. male with history of anxiety, depression, cocaine use, history of apical mural thrombus, hypertension, hyperlipidemia and coronary artery disease, presenting with aphasia and right sided weakness. He did not receive IV t-PA due to late presentation.  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 Mild developing left-to-right midline shift of about 4 mm.   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  MRI head - left malignant MCA infarct with midline shift about 10mm  MRA head - left ICA terminus persistent occlusion, no left MCA visualized   2D Echo - LVEF 45% to 50%  LDL - 26  HgbA1c - 6.1  VTE prophylaxis DIET - DYS 1 Room service appropriate? Yes; Fluid consistency: Honey Thick  aspirin 81 mg daily and clopidogrel 75 mg daily prior to admission, now on ASA  and clopidogrel   Ongoing aggressive stroke risk factor  management  Therapy recommendations:  - pending  Disposition:  Pending  Cerebral Edema  CT and MRI showed midline shift up to 10mm  Hypertonic saline - rate down to 77mL/hr, weaning off over next 24hr  Na 149->143  D/c Na Q6h  Hx of apical thrombus  12/2015 TTE showed an apical thrombus on definity evaluation, EF 40-45%  Started on coumadin in 12/2015  Repeat TTE 06/2016 EF 40-45% no evidence of thrombus  Coumadin stopped on 07/2016  Recommend TEE to evaluate apical thrombus given the current possible embolic stroke   Delirium  Pt agitated and encephalopathic 05/22/17  Concerning for substance vs. Alcohol withdraw  Off precedex now - on ativan and risperdal bid  On FA/B1/MVI  CCM on board  Hypertension  Stable  Goal SBP <180, gradually decrease to goal in 5-7 days.   Long-term BP goal 130-150 due to left ICA occlusion  Hyperlipidemia  Home meds:  Lipitor 80 mg daily - resumed in hospital  LDL 26, goal < 70  Continue statin at discharge  Cocaine abuse  UDS positive for cocaine  Cessation counseling will be provided  Other Stroke Risk Factors  Former cigarette smoker - quit  ETOH use, will be advised to drink no more than 1 drink per day.  Obesity, Body mass index is 31 kg/m., recommend weight loss, diet and exercise as appropriate   Coronary artery disease s/p stent on DAPT  Other Active Problems  Hypokalemia -> replacing   Hospital day # 7  This patient is critically ill due to left malignant MCA, cerebral edema, history of apical mural thrombus and at significant risk of neurological worsening, death form brain herniation, heart failure, recurrent stroke, seizure. This patient's care requires constant monitoring of vital signs, hemodynamics, respiratory and cardiac monitoring, review of multiple databases, neurological assessment, discussion with family, other specialists and medical decision making of high complexity. I spent 35 minutes  of  neurocritical care time in the care of this patient.  Marvel Plan, MD PhD Stroke Neurology 05/23/2017 10:07 AM     To contact Stroke Continuity provider, please refer to [email or web address]. After hours, contact General Neurology

## 2017-05-24 ENCOUNTER — Inpatient Hospital Stay (HOSPITAL_COMMUNITY): Payer: Medicaid Other

## 2017-05-24 DIAGNOSIS — R410 Disorientation, unspecified: Secondary | ICD-10-CM

## 2017-05-24 LAB — CBC
HEMATOCRIT: 41.7 % (ref 39.0–52.0)
Hemoglobin: 14.3 g/dL (ref 13.0–17.0)
MCH: 28.5 pg (ref 26.0–34.0)
MCHC: 34.3 g/dL (ref 30.0–36.0)
MCV: 83.1 fL (ref 78.0–100.0)
Platelets: 299 10*3/uL (ref 150–400)
RBC: 5.02 MIL/uL (ref 4.22–5.81)
RDW: 12.9 % (ref 11.5–15.5)
WBC: 10.3 10*3/uL (ref 4.0–10.5)

## 2017-05-24 LAB — GLUCOSE, CAPILLARY
GLUCOSE-CAPILLARY: 117 mg/dL — AB (ref 65–99)
GLUCOSE-CAPILLARY: 133 mg/dL — AB (ref 65–99)
GLUCOSE-CAPILLARY: 68 mg/dL (ref 65–99)
Glucose-Capillary: 71 mg/dL (ref 65–99)
Glucose-Capillary: 82 mg/dL (ref 65–99)
Glucose-Capillary: 93 mg/dL (ref 65–99)

## 2017-05-24 LAB — BASIC METABOLIC PANEL
Anion gap: 11 (ref 5–15)
BUN: 15 mg/dL (ref 6–20)
CO2: 21 mmol/L — ABNORMAL LOW (ref 22–32)
Calcium: 8.5 mg/dL — ABNORMAL LOW (ref 8.9–10.3)
Chloride: 109 mmol/L (ref 101–111)
Creatinine, Ser: 0.85 mg/dL (ref 0.61–1.24)
GFR calc Af Amer: >60 mL/min (ref >60)
GLUCOSE: 101 mg/dL — AB (ref 65–99)
POTASSIUM: 3.3 mmol/L — AB (ref 3.5–5.1)
Sodium: 141 mmol/L (ref 135–145)

## 2017-05-24 LAB — SODIUM: Sodium: 138 mmol/L (ref 135–145)

## 2017-05-24 MED ORDER — MANNITOL 25 % IV SOLN
INTRAVENOUS | Status: AC
  Filled 2017-05-24: qty 100

## 2017-05-24 MED ORDER — VITAL HIGH PROTEIN PO LIQD
1000.0000 mL | ORAL | Status: DC
  Administered 2017-05-24: 1000 mL

## 2017-05-24 MED ORDER — POTASSIUM CHLORIDE 10 MEQ/100ML IV SOLN
10.0000 meq | INTRAVENOUS | Status: AC
  Administered 2017-05-24 (×2): 10 meq via INTRAVENOUS
  Filled 2017-05-24 (×2): qty 100

## 2017-05-24 MED ORDER — SODIUM CHLORIDE 23.4 % INJECTION (4 MEQ/ML) FOR IV ADMINISTRATION
30.0000 mL | INTRAVENOUS | Status: AC
  Administered 2017-05-24: 30 mL via INTRAVENOUS
  Filled 2017-05-24: qty 30

## 2017-05-24 MED ORDER — VITAL HIGH PROTEIN PO LIQD: 1000.0000 mL | ORAL | Status: DC

## 2017-05-24 MED ORDER — MANNITOL 25 % IV SOLN
50.0000 g | Freq: Once | INTRAVENOUS | Status: AC
  Administered 2017-05-24: 50 g via INTRAVENOUS
  Filled 2017-05-24: qty 100

## 2017-05-24 MED ORDER — FAMOTIDINE 20 MG PO TABS
20.0000 mg | ORAL_TABLET | Freq: Two times a day (BID) | ORAL | Status: DC
  Administered 2017-05-24 – 2017-05-25 (×4): 20 mg via ORAL
  Filled 2017-05-24 (×5): qty 1

## 2017-05-24 MED ORDER — MANNITOL 25 % IV SOLN
INTRAVENOUS | Status: AC
  Filled 2017-05-24: qty 50

## 2017-05-24 MED ORDER — SODIUM CHLORIDE 3 % IV SOLN
INTRAVENOUS | Status: DC
  Administered 2017-05-24 – 2017-05-26 (×8): 75 mL/h via INTRAVENOUS
  Administered 2017-05-27: 37.5 mL/h via INTRAVENOUS
  Administered 2017-05-27: 75 mL/h via INTRAVENOUS
  Administered 2017-05-27: 37.5 mL/h via INTRAVENOUS
  Filled 2017-05-24 (×25): qty 500

## 2017-05-24 MED ORDER — MANNITOL 20 % IV SOLN: 50.0000 g | Freq: Once | Status: DC

## 2017-05-24 NOTE — Procedures (Signed)
Central Venous Catheter Insertion Procedure Note Henry David [id number] [birth year]  Procedure: Insertion of Central Venous Catheter Indications: Drug and/or fluid administration  Procedure Details Consent: Risks of procedure as well as the alternatives and risks of each were explained to the (patient/caregiver).  Consent for procedure obtained. Time Out: Verified patient identification, verified procedure, site/side was marked, verified correct patient position, special equipment/implants available, medications/allergies/relevent history reviewed, required imaging and test results available.  Performed  Maximum sterile technique was used including antiseptics, cap, gloves, gown, hand hygiene, mask and sheet. Skin prep: Chlorhexidine; local anesthetic administered A antimicrobial bonded/coated triple lumen catheter was placed in the right internal jugular vein using the Seldinger technique.  Evaluation Blood flow good Complications: No apparent complications Patient did tolerate procedure well. Chest X-ray ordered to verify placement.  CXR: pending.  Henry David 05/24/2017, 6:58 PM

## 2017-05-24 NOTE — Progress Notes (Signed)
Cortrak Tube Team Note:  Consult received to place a Cortrak feeding tube.   A 10 F Cortrak tube was placed in the R nare and secured with a nasal bridle at 82 cm. Per the Cortrak monitor reading the tube tip is postpyloric, approximately D2.   No x-ray is required. RN may begin using tube.   If the tube becomes dislodged please keep the tube and contact the Cortrak team at [email or web address] ([email or web address]) for replacement.  If after hours and replacement cannot be delayed, place a NG tube and confirm placement with an abdominal x-ray.   Christophe Louis RD, LDN, CNSC Clinical Nutrition Pager: [phone] 05/24/2017 12:52 PM

## 2017-05-24 NOTE — Progress Notes (Addendum)
STROKE TEAM PROGRESS NOTE   SUBJECTIVE (INTERVAL HISTORY) No family members present. RN at bedside. Pt sleepy drowsy this am, agitated at night and resumed precedex this am. Moving left side spontaneously, but right hemiplegia. Did not get cortrak yesterday but was inserted today, will start tube feeding.    OBJECTIVE Temp:  [98.6 F (37 C)-101 F (38.3 C)] 99.7 F (37.6 C) (10/13 1200) Pulse Rate:  [28-120] 81 (10/13 1400) Cardiac Rhythm: Normal sinus rhythm (10/13 1200) Resp:  [13-33] 14 (10/13 1400) BP: (89-158)/(52-100) 119/71 (10/13 1400) SpO2:  [91 %-99 %] 97 % (10/13 1400)  CBC:   Recent Labs Lab 05/18/17 0452  05/23/17 0525 05/24/17 0232  WBC 11.2*  < > 9.5 10.3  NEUTROABS 9.2*  --   --   --   HGB 11.5*  < > 12.8* 14.3  HCT 35.6*  < > 37.6* 41.7  MCV 86.8  < > 83.7 83.1  PLT 226  < > 267 299  < > = values in this interval not displayed.  Basic Metabolic Panel:   Recent Labs Lab 05/19/17 0450  05/20/17 0516  05/23/17 0525 05/24/17 0232  NA 155*  < >  --   < > 143 141  K 2.9*  < >  --   < > 3.2* 3.3*  CL 126*  < >  --   < > 113* 109  CO2 23  < >  --   < > 22 21*  GLUCOSE 138*  < >  --   < > 103* 101*  BUN 16  < >  --   < > 15 15  CREATININE 0.81  < >  --   < > 0.76 0.85  CALCIUM 8.0*  < >  --   < > 8.5* 8.5*  MG 2.1  --  2.1  --   --   --   PHOS 2.1*  --  3.5  --   --   --   < > = values in this interval not displayed.  Lipid Panel:     Component Value Date/Time   CHOL 186 05/17/2017 0500   CHOL 114 12/12/2016 1118   TRIG 532 (H) 05/17/2017 0500   HDL 26 (L) 05/17/2017 0500   HDL 27 (L) 12/12/2016 1118   CHOLHDL 7.2 05/17/2017 0500   VLDL UNABLE TO CALCULATE IF TRIGLYCERIDE OVER 400 mg/dL 38/45/3646 8032   LDLCALC UNABLE TO CALCULATE IF TRIGLYCERIDE OVER 400 mg/dL 08/04/8249 0370   LDLCALC 40 12/12/2016 1118   HgbA1c:  Lab Results  Component Value Date   HGBA1C 6.1 (H) 05/17/2017   Urine Drug Screen:     Component Value Date/Time    LABOPIA NONE DETECTED 05/16/2017 1902   COCAINSCRNUR POSITIVE (A) 05/16/2017 1902   LABBENZ POSITIVE (A) 05/16/2017 1902   AMPHETMU NONE DETECTED 05/16/2017 1902   THCU NONE DETECTED 05/16/2017 1902   LABBARB NONE DETECTED 05/16/2017 1902    Alcohol Level     Component Value Date/Time   ETH <10 05/16/2017 1535    IMAGING I have personally reviewed the radiological images below and agree with the radiology interpretations.  [name] 05/21/2017 1. Large evolving acute ischemic infarct involving essentially the entirety of the left MCA territory, similar to previous exams. Associated petechial hemorrhage without frank hemorrhagic transformation, most notable at the left lentiform nucleus. 2. Associated mass effect with 12 mm of left-to-right midline shift, worsened relative to most recent CT from 05/18/2017. No hydrocephalus or  ventricular trapping.  [name] 05/21/2017 1. Occlusion of the left ICA at the level of the terminus with absent perfusion within the left middle cerebral artery. 2. Patent anterior communicating artery with perfusion of both anterior cerebral arteries, and minimal patchy retrograde flow within the left A1 segment. 3. Widely patent right middle cerebral artery and posterior Circulation.  Ct Head Wo Contrast 05/18/2017 Unchanged appearance of cytotoxic edema throughout the left MCA territory with unchanged rightward midline shift measuring 4 mm. No hemorrhage. No ventricular entrapment or hydrocephalus. 05/17/2017 Large acute infarct in the distribution of the left middle cerebral artery.  Since the previous study, there is increasing mass effect with increased effacement of the left lateral ventricle.  Mild developing left-to-right midline shift of about 4 mm.  No acute intracranial hemorrhage.    Ct Cerebral Perfusion W Contrast 05/16/2017 IMPRESSION:  Large left MCA territory nonhemorrhagic infarct neck exceeds size criteria for  intra-arterial intervention.  Ct Angio Head W Or Wo Contrast Ct Angio Neck W Or Wo Contrast 05/16/2017 IMPRESSION:  1. 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  2. No significant collateral flow to the left MCA territory.  3. Patent anterior communicating artery with retrograde filling of the left A1 segment.  4. Minimal atherosclerotic calcification within the cavernous right internal carotid artery.  5. No other significant atherosclerotic disease within the head or neck.  6. Degenerative changes of the cervical spine are most pronounced at C6-7.   Ct Head Code Stroke Wo Contrast 05/16/2017 IMPRESSION:  1. Large left MCA nonhemorrhagic infarct with minimal sparing of super ganglionic cortex.  2. Mass effect with effacement of the sulci and partial effacement of the left lateral ventricle.  3. ASPECTS is 10/10   Dg Chest [facility] 05/19/2017 Stable hypoinflation.  No pneumonia nor significant pulmonary edema. The support tubes are in reasonable position.  05/16/2017 1. Low lung volumes.  2. Satisfactory positioning of support apparatus as above.   05/23/17 1. Low lung volumes. 2. Mild pulmonary vascular congestion and small effusions may reflect neurogenic edema versus cardiac. 3. Minimal bibasilar airspace disease likely reflects atelectasis.  Transthoracic Echocardiogram 05/17/2017 LVEF 45-50% Akinesis of the mid-apical anteroseptal, apical inferoseptal, and apical myocardium. Hypokinesis of the apical inferior myocardium.  TEE pending Monday   PHYSICAL EXAM Vitals:   05/24/17 1315 05/24/17 1330 05/24/17 1345 05/24/17 1400  BP: 120/68 (!) 110/53 107/67 119/71  Pulse: 83 79 77 81  Resp: (!) 26 (!) 22 (!) 22 14  Temp:      TempSrc:      SpO2: 95% 95% 96% 97%  Weight:      Height:        Physical exam: General - sleepy, global aphasic  Mental Status - drowsy, spontaneously and moving  left side, unable to answer questions or follow commands  Cranial Nerves II - XII - II - no blinking to visual threat from right III, IV, VI - Left gaze preference but able to cross midline. VII - Right facial droop XII - tongue midline  Motor Strength - Left sided movements spontaneous at least 4/5 strength, right UE mild withdraw on pain, RLE 2+/5 with pain stimulation   Motor Tone - Flaccid on RUE and RLE, normal LLE and LUE  Reflexes - Positive babinski on R  Sensation, coordination and gait not able to test.   ASSESSMENT/PLAN [name] is a 56 y.o. male  with history of anxiety, depression, cocaine use, history of apical mural thrombus, hypertension, hyperlipidemia and coronary artery disease, presenting with aphasia and right sided weakness. He did not receive IV t-PA due to late presentation.  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 Mild developing left-to-right midline shift of about 4 mm.   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  MRI head - left malignant MCA infarct with midline shift about 10mm  MRA head - left ICA terminus persistent occlusion, no left MCA visualized   2D Echo - LVEF 45% to 50%  LDL - 26  HgbA1c - 6.1  VTE prophylaxis DIET - DYS 1 Room service appropriate? Yes; Fluid consistency: Honey Thick  aspirin 81 mg daily and clopidogrel 75 mg daily prior to admission, now on ASA  and clopidogrel   Ongoing aggressive stroke risk factor management  Therapy recommendations:  - pending  Disposition:  Pending  Cerebral Edema  CT and MRI showed midline shift up to 10mm  Hypertonic saline  - rate down to 20mL/hr, weaning off over next 24hr  Na 149->143->141  D/c Na Q6h  Hx of apical thrombus  12/2015 TTE showed an apical thrombus on definity evaluation, EF 40-45%  Started on coumadin in 12/2015  Repeat TTE 06/2016 EF 40-45% no evidence of thrombus  Coumadin stopped on 07/2016  Recommend TEE to evaluate apical thrombus given the current possible embolic stroke   Delirium  Pt agitated and encephalopathic 05/22/17  Concerning for substance vs. Alcohol withdraw  on precedex, wean off as able  on ativan and risperdal bid with NG  On FA/B1/MVI  CCM on board  Hypertension  Stable, BP at low side  Gradually decrease BP to goal   Long-term BP goal 130-150 due to left ICA occlusion  Hyperlipidemia  Home meds:  Lipitor 80 mg daily - resumed in hospital  LDL 26, goal < 70  Continue statin at discharge  Cocaine abuse  UDS positive for cocaine  Cessation counseling will be provided  Dysphagia   Did not pass swallow  Had cortrak placed  Start TF today  D/c IVF once TF at goal  Other Stroke Risk Factors  Former cigarette smoker - quit  ETOH use, will be advised to drink no more than 1 drink per day.  Obesity, Body mass index is 31 kg/m., recommend weight loss, diet and exercise as appropriate   Coronary artery disease s/p stent on DAPT  Other Active Problems  Hypokalemia -> replacing   Hospital day # 8  This patient is critically ill due to left malignant MCA, cerebral edema, history of apical mural thrombus and at significant risk of neurological worsening, death form brain herniation, heart failure, recurrent stroke, seizure. This patient's care requires constant monitoring of vital signs, hemodynamics, respiratory and cardiac monitoring, review of multiple databases, neurological assessment, discussion with family, other specialists and medical decision making of high complexity. I spent 30 minutes of neurocritical care time in the care  of this patient.  Marvel Plan, MD PhD Stroke Neurology 05/24/2017 3:22 PM     To contact Stroke Continuity provider, please refer to [email or web address]. After hours, contact General Neurology

## 2017-05-24 NOTE — Progress Notes (Signed)
RN received verbal order from CCM MD to DC Right subclavian central line once PIV established.

## 2017-05-24 NOTE — Plan of Care (Signed)
RN called and stated that pt mental status change since this am, not open eyes and not responding well as before. Repeat CT head stat showed continued cerebral edema and midline shift at now 8 days out. Midline shift from 14mm 05/21/17 to today 38mm. PERRL, EOMI, no sign of uncal herniation. No hydrocephalus from CT head. Will resume 3% saline with one dose of mannitol and 23.4% saline. D/c risperdal and haldol which may interfere with neuro check.   Marvel Plan, MD PhD Stroke Neurology 05/24/2017 5:41 PM

## 2017-05-24 NOTE — Progress Notes (Signed)
PULMONARY / CRITICAL CARE MEDICINE   Name: Henry David MRN: [id number] DOB: 1961/02/13    ADMISSION DATE:  05/16/2017   CHIEF COMPLAINT:  Found Down  HISTORY OF PRESENT ILLNESS:   56 yo male found unresponsive and had large MCA infarct with occlusion of Lt carotid.  He was not a candidate for tPA or endovascular intervention. UDS positive for cocaine.  PMHx of CAD.  SUBJECTIVE:  Precedex weaned off 10/12 Periods of agitated delirium, associated with tachypnea  VITAL SIGNS: BP 137/81   Pulse 99   Temp (!) 100.5 F (38.1 C) (Axillary)   Resp (!) 26   Ht  (1.778 m)   Wt 98 kg (216 lb 0.8 oz)   SpO2 96%   BMI 31.00 kg/m   VENTILATOR SETTINGS:  N/A  INTAKE / OUTPUT: I/O last 3 completed shifts: In: 2857.9 [P.O.:360; I.V.:2197.9; IV Piggyback:300] Out: 2485 [Urine:2485]  PHYSICAL EXAMINATION:  General: ill appearing,  Neuro: obtunded, did not open eyes to voice, flacid on L HEENT: upper airway noise and secretions.  PULM: coarse bilaterally CV: regular, no M GI: soft, benign, + BS Extremities: bilateral mild LE edema  LABS:  BMET  Recent Labs Lab 05/22/17 0530 05/22/17 1300 05/23/17 0525 05/24/17 0232  NA 148* 147* 143 141  K 3.0*  --  3.2* 3.3*  CL 120*  --  113* 109  CO2 22  --  22 21*  BUN 14  --  15 15  CREATININE 0.71  --  0.76 0.85  GLUCOSE 90  --  103* 101*    Electrolytes  Recent Labs Lab 05/18/17 0452 05/19/17 0450  05/20/17 0516  05/22/17 0530 05/23/17 0525 05/24/17 0232  CALCIUM 8.0* 8.0*  < >  --   < > 7.7* 8.5* 8.5*  MG 2.2 2.1  --  2.1  --   --   --   --   PHOS 1.6* 2.1*  --  3.5  --   --   --   --   < > = values in this interval not displayed.  CBC  Recent Labs Lab 05/22/17 0430 05/23/17 0525 05/24/17 0232  WBC 9.5 9.5 10.3  HGB 12.4* 12.8* 14.3  HCT 39.3 37.6* 41.7  PLT 206 267 299    Coag's No results for input(s): APTT, INR in the last 168 hours.  ABG  Recent Labs Lab 05/23/17 1743  PHART  7.469*  PCO2ART 28.6*  PO2ART 113*    Liver Enzymes No results for input(s): AST, ALT, ALKPHOS, BILITOT, ALBUMIN in the last 168 hours.  Cardiac Enzymes No results for input(s): TROPONINI, PROBNP in the last 168 hours.  Glucose  Recent Labs Lab 05/22/17 2311 05/23/17 0759 05/23/17 1130 05/23/17 1618 05/23/17 2139 05/24/17 0748  GLUCAP 97 82 102* 85 85 82    Imaging Dg Chest Port 1 View  Result Date: 05/23/2017 CLINICAL DATA:  Labored breathing. Left carotid occlusion enlarge MCA infarct. EXAM: PORTABLE CHEST 1 VIEW COMPARISON:  One-view chest x-ray 05/20/2017 FINDINGS: Heart size is exaggerated by low lung volumes. The right subclavian catheter remains. The patient has been extubated. NG tube was removed. Mild pulmonary vascular congestion is present without frank edema. Mild bibasilar airspace opacities likely reflect atelectasis. Small effusions are present. IMPRESSION: 1. Low lung volumes. 2. Mild pulmonary vascular congestion and small effusions may reflect neurogenic edema versus cardiac. 3. Minimal bibasilar airspace disease likely reflects atelectasis. Electronically Signed   By: Marin Roberts M.D.   On: 05/23/2017 10:05  STUDIES:  CT Head 10/5 > Large left MCA nonhemorrhagic infarct with minimal sparing of super ganglionic cortex. Mass effect with effacement of the sulci and partial effacement of the left lateral ventricle. 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 CT Head 10/7> L MCA cytotoxic edema with 4 mm midline shift   CXR 10/8> Low lung volumes, ETT and CVC appropriate CXR 10/9> Persistent but improved low lung volumes, support devices in stable, appropriate position     Echo 10/6> EF 45-50%, areas of akinesis, no PFO or mural thrombus noted   MRI/MRA Head 10/10 > Evolving L MCA territory infarct with worsened edema, 12 mm midline shift, Occlusion of L  ICA  CULTURES: MRSA PCR 10/5 > Negative   ANTIBIOTICS: None   SIGNIFICANT EVENTS: 10/5 - Presents to ED with large Left MCA CVA  10/6 - 3% NS 10/9- Extubated  10/10 enceph bad, precedex  LINES/TUBES: ETT 10/5 >> 10/9 Right Subclavian CVC 10/5 >> lie to dc this  ASSESSMENT / PLAN:  NEURO: 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 Cocaine abuse. Severe encephalopathy, suspect toxic metabolic plus possible contribution of alcohol withdrawal Hx of depression, anxiety. - continue aspirin, Plavix - stop intermittent Ativan, restart Precedex - Place NG tube to allow him to receive his respiratory, consider increasing over the weekend  PULM: Compromised airway d/t neuro status, extubated 10/9  -At risk for airway compromise - NTS when necessary  CV: Hypertension. Hx of CAD, HLD. -has not required any blood pressure control, add prn's if indicated - continue Lipitor  ENDOCRINE: DM - sliding scale insulin per protocol  RENAL: Hypokalemia. Hypercloremia mca cva with edema prior, therapeutic hypernatremia - Allow sodium to drift down, avoid free water at this time  GI:  Dysphagia d/t CN palsy - obtunded, unable to tolerate oral medications or diet. Place NG tube - continue Pepcid  HEME: - DVT ppx > enoxaparin -Involve PT  ID: No evidence of active infection - Following off antibiotics   Independent CC time 31 minutes  Levy Pupa, MD, PhD 05/24/2017, 8:52 AM [place] [facility] [phone] or if no answer [phone]

## 2017-05-24 NOTE — Progress Notes (Signed)
Hypoglycemic Event  CBG: 68  Treatment: 15 GM carbohydrate snack  Symptoms: None  Follow-up CBG: Time: 2340 CBG Result:93  Possible Reasons for Event: Inadequate meal intake  Comments/MD notified: notified Angelina Pih

## 2017-05-24 NOTE — Progress Notes (Signed)
[facility] ADULT ICU REPLACEMENT PROTOCOL FOR AM LAB REPLACEMENT ONLY  The patient does apply for the [facility] Adult ICU Electrolyte Replacment Protocol based on the criteria listed below:   1. Is GFR >/= 40 ml/min? Yes.    Patient's GFR today is >60 2. Is urine output >/= 0.5 ml/kg/hr for the last 6 hours? Yes.   Patient's UOP is 0.7 ml/kg/hr 3. Is BUN < 60 mg/dL? Yes.    Patient's BUN today is 15 4. Abnormal electrolyte(s): K3.3 5. Ordered repletion with: per protocol 6. If a panic level lab has been reported, has the CCM MD in charge been notified? Yes.  .   Physician:  Marchelle Gearing, MD  Melrose Nakayama 05/24/2017 6:32 AM

## 2017-05-25 ENCOUNTER — Encounter (HOSPITAL_COMMUNITY): Payer: Self-pay | Admitting: Certified Registered Nurse Anesthetist

## 2017-05-25 ENCOUNTER — Inpatient Hospital Stay (HOSPITAL_COMMUNITY): Payer: Medicaid Other

## 2017-05-25 LAB — GLUCOSE, CAPILLARY
GLUCOSE-CAPILLARY: 85 mg/dL (ref 65–99)
GLUCOSE-CAPILLARY: 121 mg/dL — AB (ref 65–99)
GLUCOSE-CAPILLARY: 106 mg/dL — AB (ref 65–99)
GLUCOSE-CAPILLARY: 100 mg/dL — AB (ref 65–99)
Glucose-Capillary: 74 mg/dL (ref 65–99)
Glucose-Capillary: 132 mg/dL — ABNORMAL HIGH (ref 65–99)

## 2017-05-25 LAB — BASIC METABOLIC PANEL
Anion gap: 4 — ABNORMAL LOW (ref 5–15)
BUN: 18 mg/dL (ref 6–20)
CALCIUM: 8 mg/dL — AB (ref 8.9–10.3)
CO2: 22 mmol/L (ref 22–32)
CREATININE: 0.67 mg/dL (ref 0.61–1.24)
Chloride: 118 mmol/L — ABNORMAL HIGH (ref 101–111)
GFR calc non Af Amer: >60 mL/min (ref >60)
Glucose, Bld: 107 mg/dL — ABNORMAL HIGH (ref 65–99)
Potassium: 3.2 mmol/L — ABNORMAL LOW (ref 3.5–5.1)
SODIUM: 144 mmol/L (ref 135–145)

## 2017-05-25 LAB — CBC
HCT: 36.3 % — ABNORMAL LOW (ref 39.0–52.0)
Hemoglobin: 12.3 g/dL — ABNORMAL LOW (ref 13.0–17.0)
MCH: 28.4 pg (ref 26.0–34.0)
MCHC: 33.9 g/dL (ref 30.0–36.0)
MCV: 83.8 fL (ref 78.0–100.0)
PLATELETS: 264 10*3/uL (ref 150–400)
RBC: 4.33 MIL/uL (ref 4.22–5.81)
RDW: 12.9 % (ref 11.5–15.5)
WBC: 8.7 10*3/uL (ref 4.0–10.5)

## 2017-05-25 LAB — MAGNESIUM: MAGNESIUM: 2.3 mg/dL (ref 1.7–2.4)

## 2017-05-25 LAB — SODIUM
SODIUM: 147 mmol/L — AB (ref 135–145)
Sodium: 145 mmol/L (ref 135–145)
Sodium: 143 mmol/L (ref 135–145)

## 2017-05-25 MED ORDER — SODIUM CHLORIDE 0.9% FLUSH: 10.0000 mL | INTRAVENOUS | Status: DC | PRN

## 2017-05-25 MED ORDER — CHLORHEXIDINE GLUCONATE CLOTH 2 % EX PADS: 6.0000 | MEDICATED_PAD | Freq: Every day | CUTANEOUS | Status: DC

## 2017-05-25 MED ORDER — INSULIN ASPART 100 UNIT/ML ~~LOC~~ SOLN
0.0000 [IU] | SUBCUTANEOUS | Status: DC
  Administered 2017-05-25 – 2017-05-29 (×4): 2 [IU] via SUBCUTANEOUS
  Administered 2017-05-30: 3 [IU] via SUBCUTANEOUS
  Administered 2017-05-30: 2 [IU] via SUBCUTANEOUS
  Administered 2017-05-30: 3 [IU] via SUBCUTANEOUS
  Administered 2017-05-30 (×3): 2 [IU] via SUBCUTANEOUS
  Administered 2017-05-31 (×2): 3 [IU] via SUBCUTANEOUS
  Administered 2017-05-31 (×2): 2 [IU] via SUBCUTANEOUS
  Administered 2017-05-31: 3 [IU] via SUBCUTANEOUS
  Administered 2017-05-31 – 2017-06-02 (×9): 2 [IU] via SUBCUTANEOUS
  Administered 2017-06-02: 3 [IU] via SUBCUTANEOUS
  Administered 2017-06-03 (×2): 2 [IU] via SUBCUTANEOUS
  Administered 2017-06-03 (×2): 3 [IU] via SUBCUTANEOUS
  Administered 2017-06-03: 2 [IU] via SUBCUTANEOUS

## 2017-05-25 MED ORDER — VITAL HIGH PROTEIN PO LIQD
1000.0000 mL | ORAL | Status: DC
  Administered 2017-05-25 (×3)
  Administered 2017-05-25 (×2): 1000 mL
  Administered 2017-05-25 – 2017-05-26 (×8)

## 2017-05-25 MED ORDER — CHLORHEXIDINE GLUCONATE CLOTH 2 % EX PADS
6.0000 | MEDICATED_PAD | Freq: Every day | CUTANEOUS | Status: DC
  Administered 2017-05-25 – 2017-05-28 (×5): 6 via TOPICAL

## 2017-05-25 MED ORDER — PANCRELIPASE (LIP-PROT-AMYL) 12000-38000 UNITS PO CPEP
24000.0000 [IU] | ORAL_CAPSULE | Freq: Once | ORAL | Status: AC
  Administered 2017-05-25: 24000 [IU] via ORAL
  Filled 2017-05-25: qty 2

## 2017-05-25 MED ORDER — SODIUM BICARBONATE 650 MG PO TABS
650.0000 mg | ORAL_TABLET | Freq: Once | ORAL | Status: AC
  Administered 2017-05-25: 650 mg via ORAL
  Filled 2017-05-25: qty 1

## 2017-05-25 MED ORDER — SODIUM CHLORIDE 0.9% FLUSH
10.0000 mL | Freq: Two times a day (BID) | INTRAVENOUS | Status: DC
  Administered 2017-05-25 – 2017-05-26 (×4): 10 mL
  Administered 2017-05-27: 20 mL
  Administered 2017-05-28 – 2017-06-03 (×11): 10 mL

## 2017-05-25 MED ORDER — PRO-STAT SUGAR FREE PO LIQD
30.0000 mL | Freq: Two times a day (BID) | ORAL | Status: DC
  Administered 2017-05-25 – 2017-06-03 (×12): 30 mL
  Filled 2017-05-25 (×12): qty 30

## 2017-05-25 NOTE — Progress Notes (Signed)
PULMONARY / CRITICAL CARE MEDICINE   Name: Henry David MRN: [id number] DOB: 10-07-1960    ADMISSION DATE:  05/16/2017   CHIEF COMPLAINT:  Found Down  HISTORY OF PRESENT ILLNESS:   56 yo male found unresponsive and had large MCA infarct with occlusion of Lt carotid.  He was not a candidate for tPA or endovascular intervention. UDS positive for cocaine.  PMHx of CAD.  SUBJECTIVE:  Decrease in interactivity 10/13, prompted CT head that revealed more cytotoxic edema, increased in shift from 12 to 14mm. Received mannitol, bolus high dose saline and then started 3% NaCl, currently running. MS returning to prior baseline > more active  VITAL SIGNS: BP 133/83   Pulse (!) 56   Temp 98.2 F (36.8 C) (Axillary)   Resp 19   Ht  (1.778 m)   Wt 98 kg (216 lb 0.8 oz)   SpO2 94%   BMI 31.00 kg/m   VENTILATOR SETTINGS:  N/A  INTAKE / OUTPUT: I/O last 3 completed shifts: In: 2733.2 [I.V.:2241.2; NG/GT:412; IV Piggyback:80] Out: 2050 [Urine:1900; Emesis/NG output:150]  PHYSICAL EXAMINATION:  General: ill appearing Neuro: opens eyes to stim, moves L actively, flacid on the R HEENT:some UA noise, coughs up secretions adequately PULM: some referred UA noise CV: regular, no murmur GI: soft, nontender, positive bowel sounds Extremities:no significant lower extremity edema  LABS:  BMET  Recent Labs Lab 05/23/17 0525 05/24/17 0232 05/24/17 2116 05/25/17 0315  NA 143 141 138 144  K 3.2* 3.3*  --  3.2*  CL 113* 109  --  118*  CO2 22 21*  --  22  BUN 15 15  --  18  CREATININE 0.76 0.85  --  0.67  GLUCOSE 103* 101*  --  107*    Electrolytes  Recent Labs Lab 05/19/17 0450  05/20/17 0516  05/23/17 0525 05/24/17 0232 05/25/17 0315  CALCIUM 8.0*  < >  --   < > 8.5* 8.5* 8.0*  MG 2.1  --  2.1  --   --   --  2.3  PHOS 2.1*  --  3.5  --   --   --   --   < > = values in this interval not displayed.  CBC  Recent Labs Lab 05/23/17 0525 05/24/17 0232  05/25/17 0315  WBC 9.5 10.3 8.7  HGB 12.8* 14.3 12.3*  HCT 37.6* 41.7 36.3*  PLT 267 299 264    Coag's No results for input(s): APTT, INR in the last 168 hours.  ABG  Recent Labs Lab 05/23/17 1743  PHART 7.469*  PCO2ART 28.6*  PO2ART 113*    Liver Enzymes No results for input(s): AST, ALT, ALKPHOS, BILITOT, ALBUMIN in the last 168 hours.  Cardiac Enzymes No results for input(s): TROPONINI, PROBNP in the last 168 hours.  Glucose  Recent Labs Lab 05/24/17 1600 05/24/17 2130 05/24/17 2319 05/24/17 2340 05/25/17 0305 05/25/17 0808  GLUCAP 133* 71 68 93 74 85    Imaging Ct Head Wo Contrast  Addendum Date: 05/24/2017   ADDENDUM REPORT: 05/24/2017 17:59 ADDENDUM: Study discussed by telephone with Dr. Marvel Plan on 05/24/2017 at 1730 hours. Electronically Signed   By: Odessa Fleming M.D.   On: 05/24/2017 17:59   Result Date: 05/24/2017 CLINICAL DATA:  56 y/o  male with large left MCA infarct EXAM: CT HEAD WITHOUT CONTRAST TECHNIQUE: Contiguous axial images were obtained from the base of the skull through the vertex without intravenous contrast. COMPARISON:  Brain MRI 05/21/2017 and earlier.  FINDINGS: Brain: Cytotoxic edema throughout the left MCA territory with increased intracranial mass effect since the head CT on 05/18/2017. Rightward midline shift now 14 mm (12 mm on 05/21/2017). Increased lateral and third ventricle effacement but no ventriculomegaly. Petechial hemorrhage as seen by MRI. No malignant hemorrhagic transformation. Some loss of patency of the left ambient cistern but other basilar cisterns remain patent. Gray-white matter differentiation outside of the left MCA territory remains normal. Vascular: Calcified atherosclerosis at the skull base. Skull: Intact.  No acute osseous abnormality identified. Sinuses/Orbits: Right NG tube or feeding tube in place. Visualized paranasal sinuses and mastoids are stable and well pneumatized. Other: Visualized orbits and scalp soft  tissues are within normal limits. IMPRESSION: 1. Continued increased intracranial mass effect from the large left MCA territory infarct. Rightward midline shift now 14 mm (increased from 12 mm on 05/21/2017). 2. Petechial hemorrhage as seen by MRI, but no malignant hemorrhagic transformation. 3. No new intracranial abnormality. Electronically Signed: By: Odessa Fleming M.D. On: 05/24/2017 17:28   Dg Chest Port 1 View  Result Date: 05/25/2017 CLINICAL DATA:  Acute respiratory failure. EXAM: PORTABLE CHEST 1 VIEW COMPARISON:  Radiograph of June 10, 2017. FINDINGS: Stable cardiomediastinal silhouette. Stable position of feeding tube. Right internal jugular catheter is unchanged. No pneumothorax is noted. Hypoinflation of the lungs is noted with mild bibasilar subsegmental atelectasis. No significant pleural effusion is noted. Bony thorax is unremarkable. IMPRESSION: Stable support apparatus. Hypoinflation of the lungs with mild bibasilar subsegmental atelectasis. Electronically Signed   By: Lupita Raider, M.D.   On: 05/25/2017 07:22   Dg Chest Port 1 View  Result Date: 05/24/2017 CLINICAL DATA:  Central line placed EXAM: PORTABLE CHEST 1 VIEW COMPARISON:  05/23/2017 FINDINGS: RIGHT central venous line tip in distal SVC. No pneumothorax appreciated. Feeding tube extends the stomach. Lungs are clear. IMPRESSION: RIGHT central venous line with tip in distal SVC.  No pneumothorax. Electronically Signed   By: Genevive Bi M.D.   On: 05/24/2017 20:13    STUDIES:  CT Head 10/5 > Large left MCA nonhemorrhagic infarct with minimal sparing of super ganglionic cortex. Mass effect with effacement of the sulci and partial effacement of the left lateral ventricle. 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 CT Head 10/7> L MCA cytotoxic edema with 4 mm midline shift   CXR 10/8> Low lung volumes, ETT and CVC appropriate CXR  10/9> Persistent but improved low lung volumes, support devices in stable, appropriate position     Echo 10/6> EF 45-50%, areas of akinesis, no PFO or mural thrombus noted   MRI/MRA Head 10/10 > Evolving L MCA territory infarct with worsened edema, 12 mm midline shift, Occlusion of L ICA  CULTURES: MRSA PCR 10/5 > Negative   ANTIBIOTICS: None   SIGNIFICANT EVENTS: 10/5 - Presents to ED with large Left MCA CVA  10/6 - 3% NS 10/9- Extubated  10/10 enceph bad, precedex  LINES/TUBES: ETT 10/5 >> 10/9 Right Subclavian CVC 10/5 >> 10/12 R IJ CVC 10/13 >>   ASSESSMENT / PLAN:  NEURO: 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 Cocaine abuse. Severe encephalopathy, suspect toxic metabolic plus possible contribution of alcohol withdrawal Hx of depression, anxiety. Continue 3% saline.. Follow imaging as per neurology plans Continue aspirin, Plavix Continue Precedex, currently off Ativan Risperdal stopped on 10/13. Consider restart if unable to wean Precedex to  off  PULM: Compromised airway d/t neuro status, extubated 10/9  Continue pulmonary hygiene NTS if necessary  CV: Hypertension. Hx of CAD, HLD. Blood pressure currently at goal Continue Lipitor, Plavix, aspirin  ENDOCRINE: DM Sliding scale insulin per protocol  RENAL: Hypokalemia. Hypercloremia mca cva with edema prior, therapeutic hypernatremia Plan for sodium goal 150, avoid free water at this time  GI:  Dysphagia d/t CN palsy NG tube in place, advance tube feeding to goal Continue Pepcid  HEME: - DVT ppx > enoxaparin PT consult  ID: No evidence of active infection Following off antibiotics   Independent CC time 32 minutes  Levy Pupa, MD, PhD 05/25/2017, 9:01 AM [facility] [phone] or if no answer [phone]

## 2017-05-25 NOTE — Evaluation (Signed)
Physical Therapy Evaluation Patient Details Name: Henry David MRN: [id number] DOB: Feb 21, 1961 Today's Date: 05/25/2017   History of Present Illness  Pt is a 56 y.o. male with history of CAD, Cocaine use, Anxiety, Apical mural thromus, Depression, Essential HTN, PTSD, STEMI, Knee sx, Cardiac cath x3. Pt prsented with aphasia and R sided weakness. CT on 10/5 showed large L MCA infarct. CT on 10/13 showed increased intracranial mass effect with right midline shift now 14 mm.  Clinical Impression  Pt admitted with above. Pt with noted R dense hemiparesis, impaired cognition, impaired balance and requires maxAx2 for all mobility. Pt was indep PTA. Pt to benefit from aggressive rehab program, CIR for maximal functional recovery.    Follow Up Recommendations CIR    Equipment Recommendations   (TBD)    Recommendations for Other Services       Precautions / Restrictions Precautions Precautions: Fall Precaution Comments: pt in posey and L UE mit and wrist retraint Restrictions Weight Bearing Restrictions: No      Mobility  Bed Mobility Overal bed mobility: Needs Assistance Bed Mobility: Supine to Sit     Supine to sit: Total assist;+2 for physical assistance     General bed mobility comments: Assist for LEs and trunk elevation to sitting. Use of bed pad to scoot hips out to EOB  Transfers Overall transfer level: Needs assistance Equipment used: 2 person hand held assist Transfers: Sit to/from [employer] Sit to Stand: Max assist;+2 physical assistance   Squat pivot transfers: Max assist;+2 physical assistance     General transfer comment: Use of bed pad with +2 assist to squat pivot EOB>chair. Able to perform sit to stand from chair x1 with use of bed pad to boost up  Ambulation/Gait             General Gait Details: unable at this time  Stairs            Wheelchair Mobility    Modified Rankin (Stroke Patients Only)       Balance  Overall balance assessment: Needs assistance Sitting-balance support: Feet supported;Bilateral upper extremity supported Sitting balance-Leahy Scale: Poor Sitting balance - Comments: Min-max assist for sititng balance x10 minutes Postural control: Right lateral lean;Posterior lean Standing balance support: Bilateral upper extremity supported Standing balance-Leahy Scale: Zero Standing balance comment: max assist +2 for standing balance                             Pertinent Vitals/Pain Pain Assessment: Faces Faces Pain Scale: No hurt    Home Living Family/patient expects to be discharged to:: Inpatient rehab Living Arrangements: Alone (Friend has been staying with him recently) Available Help at Discharge: Friend(s);Available PRN/intermittently             Additional Comments: per good friend "Henry David" pt lived by himself and was completely independent. He has no family has his parents have passed and he is an only child    Prior Function Level of Independence: Independent               Hand Dominance        Extremity/Trunk Assessment   Upper Extremity Assessment Upper Extremity Assessment: RUE deficits/detail RUE Deficits / Details: No active movement noted. Does withdraw to noxious stimuli RUE Sensation: decreased light touch RUE Coordination: decreased fine motor;decreased gross motor    Lower Extremity Assessment Lower Extremity Assessment: RLE deficits/detail;LLE deficits/detail RLE Deficits / Details: no voluntary movement  but unable to complete MMT due to impaired cognition LLE Deficits / Details: pt able to move to command with delay but unable to complete MMT due to impaired cognition    Cervical / Trunk Assessment Cervical / Trunk Assessment: Normal  Communication   Communication: Expressive difficulties  Cognition Arousal/Alertness: Lethargic Behavior During Therapy: Flat affect Overall Cognitive Status: Difficult to assess Area of  Impairment: Following commands;Problem solving                       Following Commands: Follows one step commands inconsistently (~50% of the time)     Problem Solving: Slow processing;Decreased initiation;Difficulty sequencing;Requires verbal cues;Requires tactile cues General Comments: pt very response to friend Henry David, max v/c's to maintain eye opening      General Comments General comments (skin integrity, edema, etc.): VSS stable throughout    Exercises     Assessment/Plan    PT Assessment Patient needs continued PT services  PT Problem List Decreased strength;Decreased range of motion;Decreased activity tolerance;Decreased balance;Decreased mobility;Decreased coordination;Decreased cognition;Decreased knowledge of use of DME;Decreased safety awareness       PT Treatment Interventions DME instruction;Gait training;Stair training;Functional mobility training;Therapeutic activities;Therapeutic exercise;Balance training;Neuromuscular re-education;Cognitive remediation    PT Goals (Current goals can be found in the Care Plan section)  Acute Rehab PT Goals Patient Stated Goal: didn't state PT Goal Formulation: Patient unable to participate in goal setting Time For Goal Achievement: 06/08/17 Potential to Achieve Goals: Fair    Frequency Min 4X/week   Barriers to discharge Decreased caregiver support lives alone    Co-evaluation PT/OT/SLP Co-Evaluation/Treatment: Yes Reason for Co-Treatment: Complexity of the patient's impairments (multi-system involvement) PT goals addressed during session: Mobility/safety with mobility OT goals addressed during session: ADL's and self-care       AM-PAC PT "6 Clicks" Daily Activity  Outcome Measure Difficulty turning over in bed (including adjusting bedclothes, sheets and blankets)?: Unable Difficulty moving from lying on back to sitting on the side of the bed? : Unable Difficulty sitting down on and standing up from a chair  with arms (e.g., wheelchair, bedside commode, etc,.)?: Unable Help needed moving to and from a bed to chair (including a wheelchair)?: Total Help needed walking in hospital room?: Total Help needed climbing 3-5 steps with a railing? : Total 6 Click Score: 6    End of Session   Activity Tolerance: Patient tolerated treatment well Patient left: in chair;with call bell/phone within reach;with family/visitor present Nurse Communication: Mobility status PT Visit Diagnosis: Difficulty in walking, not elsewhere classified (R26.2);Muscle weakness (generalized) (M62.81);Hemiplegia and hemiparesis Hemiplegia - Right/Left: Right    Time: 6578-4696 PT Time Calculation (min) (ACUTE ONLY): 29 min   Charges:   PT Evaluation $PT Eval High Complexity: 1 High     PT G Codes:        Lewis Shock, PT, DPT Pager #: [phone] Office #: [phone]   Sible Straley M Noel Henandez 05/25/2017, 2:23 PM

## 2017-05-25 NOTE — Progress Notes (Signed)
STROKE TEAM PROGRESS NOTE   SUBJECTIVE (INTERVAL HISTORY) No family members present. RN at bedside. Pt mental status much improved from yesterday. He was very drowsy and sleepy yesterday and not able to wake up. Repeat CT head showed progressive midline shift to 57mm. We then d/c risperdal and give one dose of mannitol and started 3% saline with 23.4% bolus. This morning he is awake and alert and active on the left.    OBJECTIVE Temp:  [98.1 F (36.7 C)-99.7 F (37.6 C)] 98.8 F (37.1 C) (10/14 0800) Pulse Rate:  [28-106] 59 (10/14 0900) Cardiac Rhythm: Normal sinus rhythm (10/14 0800) Resp:  [13-30] 19 (10/14 0900) BP: (89-158)/(53-107) 121/80 (10/14 0900) SpO2:  [91 %-100 %] 95 % (10/14 0900)  CBC:   Recent Labs Lab 05/24/17 0232 05/25/17 0315  WBC 10.3 8.7  HGB 14.3 12.3*  HCT 41.7 36.3*  MCV 83.1 83.8  PLT 299 264    Basic Metabolic Panel:   Recent Labs Lab 05/19/17 0450  05/20/17 0516  05/24/17 0232 05/24/17 2116 05/25/17 0315  NA 155*  < >  --   < > 141 138 144  K 2.9*  < >  --   < > 3.3*  --  3.2*  CL 126*  < >  --   < > 109  --  118*  CO2 23  < >  --   < > 21*  --  22  GLUCOSE 138*  < >  --   < > 101*  --  107*  BUN 16  < >  --   < > 15  --  18  CREATININE 0.81  < >  --   < > 0.85  --  0.67  CALCIUM 8.0*  < >  --   < > 8.5*  --  8.0*  MG 2.1  --  2.1  --   --   --  2.3  PHOS 2.1*  --  3.5  --   --   --   --   < > = values in this interval not displayed.  Lipid Panel:     Component Value Date/Time   CHOL 186 05/17/2017 0500   CHOL 114 12/12/2016 1118   TRIG 532 (H) 05/17/2017 0500   HDL 26 (L) 05/17/2017 0500   HDL 27 (L) 12/12/2016 1118   CHOLHDL 7.2 05/17/2017 0500   VLDL UNABLE TO CALCULATE IF TRIGLYCERIDE OVER 400 mg/dL 54/65/0354 6568   LDLCALC UNABLE TO CALCULATE IF TRIGLYCERIDE OVER 400 mg/dL 12/75/1700 1749   LDLCALC 40 12/12/2016 1118   HgbA1c:  Lab Results  Component Value Date   HGBA1C 6.1 (H) 05/17/2017   Urine Drug Screen:      Component Value Date/Time   LABOPIA NONE DETECTED 05/16/2017 1902   COCAINSCRNUR POSITIVE (A) 05/16/2017 1902   LABBENZ POSITIVE (A) 05/16/2017 1902   AMPHETMU NONE DETECTED 05/16/2017 1902   THCU NONE DETECTED 05/16/2017 1902   LABBARB NONE DETECTED 05/16/2017 1902    Alcohol Level     Component Value Date/Time   ETH <10 05/16/2017 1535    IMAGING I have personally reviewed the radiological images below and agree with the radiology interpretations.  [name] 05/21/2017 1. Large evolving acute ischemic infarct involving essentially the entirety of the left MCA territory, similar to previous exams. Associated petechial hemorrhage without frank hemorrhagic transformation, most notable at the left lentiform nucleus. 2. Associated mass effect with 12 mm of left-to-right midline shift, worsened  relative to most recent CT from 05/18/2017. No hydrocephalus or ventricular trapping.  [name] 05/21/2017 1. Occlusion of the left ICA at the level of the terminus with absent perfusion within the left middle cerebral artery. 2. Patent anterior communicating artery with perfusion of both anterior cerebral arteries, and minimal patchy retrograde flow within the left A1 segment. 3. Widely patent right middle cerebral artery and posterior Circulation.  Ct Head Wo Contrast 05/16/2017 IMPRESSION:  1. Large left MCA nonhemorrhagic infarct with minimal sparing of super ganglionic cortex.  2. Mass effect with effacement of the sulci and partial effacement of the left lateral ventricle.  3. ASPECTS is 10/10   05/18/2017 Unchanged appearance of cytotoxic edema throughout the left MCA territory with unchanged rightward midline shift measuring 4 mm. No hemorrhage. No ventricular entrapment or hydrocephalus.  05/17/2017 Large acute infarct in the distribution of the left middle cerebral artery.  Since the previous study, there is increasing mass effect with increased  effacement of the left lateral ventricle.  Mild developing left-to-right midline shift of about 4 mm.  No acute intracranial hemorrhage.    05/24/2017   Rightward midline shift now 14 mm (increased from 12 mm on 05/21/2017). 2. Petechial hemorrhage as seen by MRI, but no malignant hemorrhagic transformation. 3. No new intracranial abnormality. Electronically Signed: By: Odessa Fleming M.D. On: 05/24/2017 17:28   Ct Cerebral Perfusion W Contrast 05/16/2017 IMPRESSION:  Large left MCA territory nonhemorrhagic infarct neck exceeds size criteria for intra-arterial intervention.  Ct Angio Head W Or Wo Contrast Ct Angio Neck W Or Wo Contrast 05/16/2017 IMPRESSION:  1. 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  2. No significant collateral flow to the left MCA territory.  3. Patent anterior communicating artery with retrograde filling of the left A1 segment.  4. Minimal atherosclerotic calcification within the cavernous right internal carotid artery.  5. No other significant atherosclerotic disease within the head or neck.  6. Degenerative changes of the cervical spine are most pronounced at C6-7.   Dg Chest Port 1 View 05/19/2017 Stable hypoinflation.  No pneumonia nor significant pulmonary edema. The support tubes are in reasonable position.  05/16/2017 1. Low lung volumes.  2. Satisfactory positioning of support apparatus as above.   05/23/17 1. Low lung volumes. 2. Mild pulmonary vascular congestion and small effusions may reflect neurogenic edema versus cardiac. 3. Minimal bibasilar airspace disease likely reflects atelectasis.  Dg Chest Port 1 View 05/25/2017 IMPRESSION: Stable support apparatus. Hypoinflation of the lungs with mild bibasilar subsegmental atelectasis.   Transthoracic Echocardiogram 05/17/2017 LVEF 45-50% Akinesis of the mid-apical anteroseptal, apical inferoseptal, and apical myocardium.  Hypokinesis of the apical inferior myocardium.  TEE pending Monday   PHYSICAL EXAM Vitals:   05/25/17 0700 05/25/17 0730 05/25/17 0800 05/25/17 0900  BP: (!) 141/73  133/83 121/80  Pulse: 71 (!) 59 (!) 56 (!) 59  Resp: (!) Temp:   98.8 F (37.1 C)   TempSrc:   Oral   SpO2: 94% 96% 94% 95%  Weight:      Height:        Physical exam: General - awake alert, global aphasic  Mental Status - awake alert, spontaneously and moving left side, unable to answer questions or follow commands. Global aphasia. Questionable follow commands of eye opening and closing.  Cranial Nerves II - XII - II - no blinking to visual threat from right  III, IV, VI - Left gaze preference but able to cross midline. VII - Right facial droop XII - tongue midline  Motor Strength - Left sided movements spontaneous at least 4/5 strength, right UE mild withdraw on pain, RLE 2/5 with pain stimulation   Motor Tone - Flaccid on RUE and RLE, normal LLE and LUE  Reflexes - Positive babinski on R  Sensation, coordination and gait not able to test.   ASSESSMENT/PLAN Henry David is a 56 y.o. male with history of anxiety, depression, cocaine use, history of apical mural thrombus, hypertension, hyperlipidemia and coronary artery disease, presenting with aphasia and right sided weakness. He did not receive IV t-PA due to late presentation.  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 Mild developing left-to-right midline shift of about 4 mm.   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  MRI head - left  malignant MCA infarct with midline shift about 10mm  MRA head - left ICA terminus persistent occlusion, no left MCA visualized   2D Echo - LVEF 45% to 50%  LDL - 26  HgbA1c - 6.1  VTE prophylaxis NPO on TF @ 15  aspirin 81 mg daily and clopidogrel 75 mg daily prior to admission, now on ASA  and clopidogrel   Ongoing aggressive stroke risk factor management  Therapy recommendations:  - pending  Disposition:  Pending  Cerebral Edema  CT and MRI showed midline shift up to 10mm  Hypertonic saline - weaned off 05/23/17  However, CT repeat 05/24/17 showed progressive midline shift  3% restarted @ 75 with one dose of mannitol and 23.4%  Na 149->143->141->144  Na Q6h  Hx of apical thrombus  12/2015 TTE showed an apical thrombus on definity evaluation, EF 40-45%  Started on coumadin in 12/2015  Repeat TTE 06/2016 EF 40-45% no evidence of thrombus  Coumadin stopped on 07/2016  Recommend TEE to evaluate apical thrombus given the current possible embolic stroke - scheduled Monday 05/26/17  Delirium  Pt agitated and encephalopathic 05/22/17  Concerning for substance vs. Alcohol withdraw  on precedex, wean off as able  on ativan and risperdal bid with NG  On FA/B1/MVI  CCM on board  Hypertension  Stable, BP at low side  Gradually decrease BP to goal   Long-term BP goal 130-150 due to left ICA occlusion  Hyperlipidemia  Home meds:  Lipitor 80 mg daily - resumed in hospital  LDL 26, goal < 70  Continue statin at discharge  Cocaine abuse  UDS positive for cocaine  Cessation counseling will be provided  Dysphagia   Did not pass swallow  Had cortrak placed  Continue TF @ 15  Other Stroke Risk Factors  Former cigarette smoker - quit  ETOH use, will be advised to drink no more than 1 drink per day.  Obesity, Body mass index is 31 kg/m., recommend weight loss, diet and exercise as appropriate   Coronary artery disease s/p stent on  DAPT  Other Active Problems  Hypokalemia -> replacing  CXR stable   Hospital day # 9  This patient is critically ill  due to left malignant MCA, cerebral edema, history of apical mural thrombus and at significant risk of neurological worsening, death form brain herniation, heart failure, recurrent stroke, seizure. This patient's care requires constant monitoring of vital signs, hemodynamics, respiratory and cardiac monitoring, review of multiple databases, neurological assessment, discussion with family, other specialists and medical decision making of high complexity. I spent 35 minutes of neurocritical care time in the care of this patient.  Marvel Plan, MD PhD Stroke Neurology 05/25/2017 9:37 AM     To contact Stroke Continuity provider, please refer to [email or web address]. After hours, contact General Neurology

## 2017-05-25 NOTE — Progress Notes (Signed)
Unable to complete admission questions since pt. has expressive aphasia/ possible receptive aphasia and is unable to answer admission questions. Per pt's roommate he does not have any family/next of kin.

## 2017-05-25 NOTE — Evaluation (Signed)
Occupational Therapy Evaluation Patient Details Name: Henry David MRN: [id number] DOB: [birth year] Today's Date: 05/25/2017    History of Present Illness (P) Pt is a 56 y.o. male with history of CAD, Cocaine use, Anxiety, Apical mural thromus, Depression, Essential HTN, PTSD, STEMI, Knee sx, Cardiac cath x3. Pt prsented with aphasia and R sided weakness. CT on 10/5 showed large L MCA infarct. CT on 10/13 showed increased intracranial mass effect with right midline shift now 14 mm.   Clinical Impression   Per friend of pt; pt was independent with ADL PTA. Currently pt able to perform grooming task sitting EOB with hand over hand assist. Pt requires max assist +2 for stand pivot transfer EOB>chair. Pt presenting with impaired communication, impaired cognition, visual deficits, R sided weakness, and poor sitting/standing balance impacting his independence and safety with ADL and functional mobility. At this time, recommending CIR level therapies for follow up to maximize independence and safety with ADL and functional mobility. Pt would benefit from continued skilled OT to address established goals.    Follow Up Recommendations  CIR;Supervision/Assistance - 24 hour    Equipment Recommendations  Other (comment) (TBD)    Recommendations for Other Services Rehab consult     Precautions / Restrictions Precautions Precautions: (P) Fall Restrictions Weight Bearing Restrictions: (P) No      Mobility Bed Mobility Overal bed mobility: (P) Needs Assistance Bed Mobility: (P) Supine to Sit     Supine to sit: (P) Total assist;+2 for physical assistance     General bed mobility comments: (P) Assist for LEs and trunk elevation to sitting. Use of bed pad to scoot hips out to EOB  Transfers Overall transfer level: (P) Needs assistance Equipment used: (P) 2 person hand held assist Transfers: (P) Sit to/from [employer] Transfers Sit to Stand: (P) Max assist;+2 physical assistance    Squat pivot transfers: (P) Max assist;+2 physical assistance     General transfer comment: (P) Use of bed pad with +2 assist to squat pivot EOB>chair. Able to perform sit to stand from chair x1 with use of bed pad to boost up    Balance Overall balance assessment: (P) Needs assistance Sitting-balance support: (P) Feet supported;Bilateral upper extremity supported Sitting balance-Leahy Scale: (P) Poor Sitting balance - Comments: (P) Min-max assist for sititng balance x10 minutes Postural control: (P) Right lateral lean;Posterior lean Standing balance support: (P) Bilateral upper extremity supported Standing balance-Leahy Scale: (P) Zero Standing balance comment: (P) max assist +2 for standing balance                           ADL either performed or assessed with clinical judgement   ADL Overall ADL's : Needs assistance/impaired Eating/Feeding: NPO   Grooming: Maximal assistance;Sitting;Wash/dry face Grooming Details (indicate cue type and reason): Hand over hand assist with max cues to sustain activity                 Toilet Transfer: Maximal assistance;+2 for physical assistance;Squat-pivot Toilet Transfer Details (indicate cue type and reason): Simulated by squat pivot transfer EOB>chair         Functional mobility during ADLs: Maximal assistance;+2 for physical assistance General ADL Comments: Total assist overall for ADL      Vision   Vision Assessment?: Vision impaired- to be further tested in functional context Additional Comments: L gaze preference and L head turn. Pt with difficulty tracking initailly but with max multimodal cues able to track to midline and  turn head to midline. Difficult to assess due to impaired cognition and communication.     Perception     Praxis      Pertinent Vitals/Pain Pain Assessment: Faces Faces Pain Scale: (P) No hurt     Hand Dominance     Extremity/Trunk Assessment Upper Extremity Assessment Upper  Extremity Assessment: RUE deficits/detail RUE Deficits / Details: (P) No active movement noted. Does withdraw to noxious stimuli RUE Sensation: (P) decreased light touch RUE Coordination: (P) decreased fine motor;decreased gross motor   Lower Extremity Assessment Lower Extremity Assessment: Defer to PT evaluation       Communication Communication Communication: (P) Expressive difficulties   Cognition Arousal/Alertness: (P) Lethargic Behavior During Therapy: (P) Flat affect Overall Cognitive Status: (P) Difficult to assess Area of Impairment: (P) Following commands;Problem solving                       Following Commands: (P) Follows one step commands inconsistently (~50% of the time)     Problem Solving: (P) Slow processing;Decreased initiation;Difficulty sequencing;Requires verbal cues;Requires tactile cues     General Comments  VSS throughout session    Exercises     Shoulder Instructions      Home Living Family/patient expects to be discharged to:: (P) Private residence Living Arrangements: (P) Alone (Friend has been staying with him recently) Available Help at Discharge: (P) Friend(s);Available PRN/intermittently                             Additional Comments: (P) Information provided by good friend "Henry David"  Lives With: (P) Friend(s)    Prior Functioning/Environment Level of Independence: (P) Independent                 OT Problem List: Decreased strength;Decreased range of motion;Decreased activity tolerance;Impaired balance (sitting and/or standing);Impaired vision/perception;Decreased coordination;Decreased cognition;Decreased safety awareness;Decreased knowledge of use of DME or AE;Decreased knowledge of precautions;Impaired sensation;Impaired tone;Obesity;Impaired UE functional use;Increased edema      OT Treatment/Interventions: Self-care/ADL training;Therapeutic exercise;Neuromuscular education;Energy conservation;DME and/or AE  instruction;Therapeutic activities;Visual/perceptual remediation/compensation;Cognitive remediation/compensation;Patient/family education;Balance training    OT Goals(Current goals can be found in the care plan section) Acute Rehab OT Goals Patient Stated Goal: (P) none OT Goal Formulation: Patient unable to participate in goal setting Time For Goal Achievement: 06/08/17 Potential to Achieve Goals: Good ADL Goals Pt Will Perform Grooming: with min assist;sitting Pt Will Transfer to Toilet: with mod assist;stand pivot transfer;bedside commode Additional ADL Goal #1: Pt will tolerate sitting EOB >10 minutes with min assist as precursor to ADL. Additional ADL Goal #2: Pt will follow one step command 75% of the time with no more than a 5 second delay. Additional ADL Goal #3: Pt will visually track past midline to R side with min cues 50% of the time.  OT Frequency: Min 3X/week   Barriers to D/C:            Co-evaluation PT/OT/SLP Co-Evaluation/Treatment: Yes Reason for Co-Treatment: Complexity of the patient's impairments (multi-system involvement);For patient/therapist safety;To address functional/ADL transfers   OT goals addressed during session: ADL's and self-care      AM-PAC PT "6 Clicks" Daily Activity     Outcome Measure Help from another person eating meals?: Total Help from another person taking care of personal grooming?: Total Help from another person toileting, which includes using toliet, bedpan, or urinal?: Total Help from another person bathing (including washing, rinsing, drying)?: Total Help from another person to  put on and taking off regular upper body clothing?: Total Help from another person to put on and taking off regular lower body clothing?: Total 6 Click Score: 6   End of Session Nurse Communication: Mobility status;Need for lift equipment  Activity Tolerance: Patient tolerated treatment well Patient left: in chair;with call bell/phone within reach;with  family/visitor present;with nursing/sitter in room;with restraints reapplied  OT Visit Diagnosis: Unsteadiness on feet (R26.81);Other abnormalities of gait and mobility (R26.89);Muscle weakness (generalized) (M62.81);Hemiplegia and hemiparesis Hemiplegia - Right/Left: Right                Time: 1610-9604 OT Time Calculation (min): 29 min Charges:  OT General Charges $OT Visit: 1 Visit OT Evaluation $OT Eval Moderate Complexity: 1 Mod G-Codes:     Holleigh Crihfield A. Brett Albino, M.S., OTR/L Pager: [phone]  Gaye Alken 05/25/2017, 2:19 PM

## 2017-05-25 NOTE — Progress Notes (Signed)
Rehab Admissions Coordinator Note:  Patient was screened by Trish Mage for appropriateness for an Inpatient Acute Rehab Consult.  At this time, we are recommending Inpatient Rehab consult.  Trish Mage 05/25/2017, 6:53 PM  I can be reached at [phone].

## 2017-05-26 ENCOUNTER — Inpatient Hospital Stay (HOSPITAL_COMMUNITY): Payer: Medicaid Other

## 2017-05-26 ENCOUNTER — Encounter (HOSPITAL_COMMUNITY): Payer: Self-pay | Admitting: Certified Registered Nurse Anesthetist

## 2017-05-26 ENCOUNTER — Encounter (HOSPITAL_COMMUNITY)
Admission: EM | Disposition: A | Discharge status: Skilled Nursing Facility | Payer: Self-pay | Source: Home / Self Care | Attending: Internal Medicine

## 2017-05-26 DIAGNOSIS — I2510 Atherosclerotic heart disease of native coronary artery without angina pectoris: Secondary | ICD-10-CM | POA: Diagnosis present

## 2017-05-26 DIAGNOSIS — I69391 Dysphagia following cerebral infarction: Secondary | ICD-10-CM

## 2017-05-26 DIAGNOSIS — J9690 Respiratory failure, unspecified, unspecified whether with hypoxia or hypercapnia: Secondary | ICD-10-CM

## 2017-05-26 DIAGNOSIS — E876 Hypokalemia: Secondary | ICD-10-CM | POA: Diagnosis present

## 2017-05-26 DIAGNOSIS — D62 Acute posthemorrhagic anemia: Secondary | ICD-10-CM

## 2017-05-26 DIAGNOSIS — F1410 Cocaine abuse, uncomplicated: Secondary | ICD-10-CM

## 2017-05-26 DIAGNOSIS — E870 Hyperosmolality and hypernatremia: Secondary | ICD-10-CM | POA: Diagnosis present

## 2017-05-26 DIAGNOSIS — G8191 Hemiplegia, unspecified affecting right dominant side: Secondary | ICD-10-CM

## 2017-05-26 DIAGNOSIS — I10 Essential (primary) hypertension: Secondary | ICD-10-CM

## 2017-05-26 DIAGNOSIS — R451 Restlessness and agitation: Secondary | ICD-10-CM | POA: Diagnosis present

## 2017-05-26 LAB — GLUCOSE, CAPILLARY
GLUCOSE-CAPILLARY: 115 mg/dL — AB (ref 65–99)
GLUCOSE-CAPILLARY: 103 mg/dL — AB (ref 65–99)
GLUCOSE-CAPILLARY: 73 mg/dL (ref 65–99)
GLUCOSE-CAPILLARY: 105 mg/dL — AB (ref 65–99)
GLUCOSE-CAPILLARY: 100 mg/dL — AB (ref 65–99)
Glucose-Capillary: 91 mg/dL (ref 65–99)

## 2017-05-26 LAB — BASIC METABOLIC PANEL
ANION GAP: 5 (ref 5–15)
BUN: 18 mg/dL (ref 6–20)
CO2: 23 mmol/L (ref 22–32)
Calcium: 8.1 mg/dL — ABNORMAL LOW (ref 8.9–10.3)
Chloride: 118 mmol/L — ABNORMAL HIGH (ref 101–111)
Creatinine, Ser: 0.69 mg/dL (ref 0.61–1.24)
GFR calc non Af Amer: >60 mL/min (ref >60)
GLUCOSE: 122 mg/dL — AB (ref 65–99)
Potassium: 3.1 mmol/L — ABNORMAL LOW (ref 3.5–5.1)
Sodium: 146 mmol/L — ABNORMAL HIGH (ref 135–145)

## 2017-05-26 LAB — POCT I-STAT 3, ART BLOOD GAS (G3+)
Acid-base deficit: 1 mmol/L (ref 0.0–2.0)
BICARBONATE: 23.2 mmol/L (ref 20.0–28.0)
O2 Saturation: 51 %
PO2 ART: 27 mmHg — AB (ref 83.0–108.0)
Patient temperature: 98.7
TCO2: 24 mmol/L (ref 22–32)
pCO2 arterial: 35.9 mmHg (ref 32.0–48.0)
pH, Arterial: 7.419 (ref 7.350–7.450)

## 2017-05-26 LAB — MAGNESIUM: Magnesium: 2.1 mg/dL (ref 1.7–2.4)

## 2017-05-26 LAB — CBC
HEMATOCRIT: 36.6 % — AB (ref 39.0–52.0)
HEMOGLOBIN: 12.1 g/dL — AB (ref 13.0–17.0)
MCH: 27.9 pg (ref 26.0–34.0)
MCHC: 33.1 g/dL (ref 30.0–36.0)
MCV: 84.3 fL (ref 78.0–100.0)
PLATELETS: 283 10*3/uL (ref 150–400)
RBC: 4.34 MIL/uL (ref 4.22–5.81)
RDW: 13.3 % (ref 11.5–15.5)
WBC: 9.1 10*3/uL (ref 4.0–10.5)

## 2017-05-26 LAB — PHOSPHORUS: Phosphorus: 2.5 mg/dL (ref 2.5–4.6)

## 2017-05-26 LAB — SODIUM
SODIUM: 146 mmol/L — AB (ref 135–145)
SODIUM: 146 mmol/L — AB (ref 135–145)

## 2017-05-26 SURGERY — ECHOCARDIOGRAM, TRANSESOPHAGEAL
Anesthesia: Monitor Anesthesia Care

## 2017-05-26 MED ORDER — POTASSIUM CHLORIDE 20 MEQ/15ML (10%) PO SOLN
30.0000 meq | ORAL | Status: DC
  Filled 2017-05-26: qty 30

## 2017-05-26 MED ORDER — HALOPERIDOL LACTATE 5 MG/ML IJ SOLN
5.0000 mg | INTRAMUSCULAR | Status: DC | PRN
  Administered 2017-05-26 – 2017-05-31 (×5): 5 mg via INTRAVENOUS
  Filled 2017-05-26 (×7): qty 1

## 2017-05-26 MED ORDER — OSMOLITE 1.2 CAL PO LIQD
1000.0000 mL | ORAL | Status: DC
  Filled 2017-05-26 (×10): qty 1000

## 2017-05-26 MED ORDER — QUETIAPINE FUMARATE 25 MG PO TABS: 25.0000 mg | ORAL_TABLET | Freq: Every day | ORAL | Status: DC

## 2017-05-26 MED ORDER — POTASSIUM CHLORIDE 10 MEQ/50ML IV SOLN
10.0000 meq | INTRAVENOUS | Status: AC
  Administered 2017-05-26 (×4): 10 meq via INTRAVENOUS
  Filled 2017-05-26 (×4): qty 50

## 2017-05-26 NOTE — Consult Note (Signed)
Physical Medicine and Rehabilitation Consult Reason for Consult: Right sided weakness and aphasia Referring Physician: Dr. Tyson Alias   HPI: Henry David is a 56 y.o.right handed male with history of CAD/STEMI maintained on Plavix, hypertension and cocaine abuse. Per chart review, patient lives alone and independent prior to admission. Presented 05/16/2017 after being found down with right sided weakness and aphasia. CT of the head reviewed, showing left MCA CVA. Patient did require intubation. Patient did not receive TPA. UDS positive for cocaine and benzodiazepines. Follow-up cranial CT scan unchanged no ventricular entrapment or hydrocephalus. MRI follow-up 05/21/2017 showed large evolving acute ischemic infarction left MCA territory. Associated mass effect with 12 mm left to right midline shift. Echocardiogram with ejection fraction of 50%. Hypokinesis of the apical inferior myocardium. Patient was extubated 05/20/2017. Maintained on aspirin and Plavix for CVA prophylaxis. Subcutaneous Lovenox for DVT prophylaxis. Nasogastric tube for nutritional support. Physical occupational therapy evaluations completed 05/25/2017 with recommendations of physical medicine rehabilitation consult.   Review of Systems  Unable to perform ROS: Acuity of condition   Past Medical History:  Diagnosis Date  . Anxiety   . Apical mural thrombus   . BPH (benign prostatic hyperplasia)   . Depression   . Diverticulitis   . Elevated cholesterol   . Essential hypertension   . Post traumatic stress disorder   . STEMI (ST elevation myocardial infarction) [facility])    Past Surgical History:  Procedure Laterality Date  . CARDIAC CATHETERIZATION N/A 07/09/2015   Procedure: Left Heart Cath and Coronary Angiography;  Surgeon: Lennette Bihari, MD;  Location: [facility] INVASIVE CV LAB;  Service: Cardiovascular;  Laterality: N/A;  . CARDIAC CATHETERIZATION N/A 07/09/2015   Procedure: Coronary Stent Intervention;  Surgeon:  Lennette Bihari, MD;  Location: [facility] INVASIVE CV LAB;  Service: Cardiovascular;  Laterality: N/A;  . CARDIAC CATHETERIZATION N/A 07/11/2015   Procedure: Coronary Stent Intervention;  Surgeon: Lennette Bihari, MD;  Location: [facility] INVASIVE CV LAB;  Service: Cardiovascular;  Laterality: N/A;  . KNEE SURGERY     Family History  Problem Relation Age of Onset  . Heart disease Mother   . Heart disease Father    Social History:  reports that he quit smoking about 22 months ago. His smoking use included Cigarettes. He smoked 0.00 packs per day. He has quit using smokeless tobacco. He reports that he drinks about 2.4 oz of alcohol per week . He reports that he does not use drugs. Allergies:  Allergies  Allergen Reactions  . Coconut Fatty Acids     unknown   Medications Prior to Admission  Medication Sig Dispense Refill  . acetaminophen (TYLENOL) 500 MG tablet Take 500 mg by mouth every 6 (six) hours as needed.    Marland Kitchen atorvastatin (LIPITOR) 80 MG tablet TAKE 1 TABLET BY MOUTH DAILY AT 6 PM. 30 tablet 3  . buPROPion (WELLBUTRIN SR) 150 MG 12 hr tablet TAKE ONE TABLET BY MOUTH TWICE DAILY 60 tablet 3  . famotidine (PEPCID) 20 MG tablet Take 1 tablet (20 mg total) by mouth 2 (two) times daily. 60 tablet 3  . fluticasone (FLONASE) 50 MCG/ACT nasal spray PLACE 2 SPRAYS INTO BOTH NOSTRILS DAILY. 16 g 3  . hydrOXYzine (ATARAX/VISTARIL) 25 MG tablet Take 1 tablet (25 mg total) by mouth 3 (three) times daily as needed. 60 tablet 3  . lisinopril (PRINIVIL,ZESTRIL) 2.5 MG tablet TAKE 1 TABLET BY MOUTH AT BEDTIME. 30 tablet 3  . NITROSTAT 0.4 MG SL tablet  PLACE 1 TABLET UNDER THE TONGUE EVERY 5 MINUTES AS NEEDED FOR CHEST PAIN. MAX-3 DOSES WITHIN A 15 MINUTE INTERVAL 25 tablet 0  . olopatadine (PATANOL) 0.1 % ophthalmic solution PLACE 1 DROP INTO BOTH EYES 2 TIMES DAILY. 15 mL 0  . sertraline (ZOLOFT) 100 MG tablet Take 1 tablet (100 mg total) by mouth daily. 30 tablet 3  . aspirin 81 MG EC tablet Take 1 tablet (81 mg  total) by mouth daily. 30 tablet 0  . cetirizine (ZYRTEC) 10 MG tablet TAKE 1 TABLET BY MOUTH DAILY. 30 tablet 3  . clopidogrel (PLAVIX) 75 MG tablet TAKE 1 TABLET BY MOUTH DAILY. 90 tablet 0  . cyclobenzaprine (FLEXERIL) 10 MG tablet TAKE 1 TABLET BY MOUTH 2 TIMES DAILY AS NEEDED FOR MUSCLE SPASMS. 60 tablet 1  . metoprolol tartrate (LOPRESSOR) 25 MG tablet TAKE 1 TABLET BY MOUTH 2 TIMES DAILY. 60 tablet 3  . Omega-3 Fatty Acids (FISH OIL) 1000 MG CAPS Take 2 capsules by mouth 2 (two) times daily.    . tamsulosin (FLOMAX) 0.4 MG CAPS capsule TAKE 1 CAPSULE BY MOUTH DAILY. 30 capsule 3    Home: Home Living Family/patient expects to be discharged to:: Inpatient rehab Living Arrangements: Alone (Friend has been staying with him recently) Available Help at Discharge: Friend(s), Available PRN/intermittently Additional Comments: per good friend "[name]" pt lived by himself and was completely independent. He has no family has his parents have passed and he is an only child  Lives With: Friend(s)  Functional History: Prior Function Level of Independence: Independent Functional Status:  Mobility: Bed Mobility Overal bed mobility: Needs Assistance Bed Mobility: Supine to Sit Supine to sit: Total assist, +2 for physical assistance General bed mobility comments: Assist for LEs and trunk elevation to sitting. Use of bed pad to scoot hips out to EOB Transfers Overall transfer level: Needs assistance Equipment used: 2 person hand held assist Transfers: Sit to/from Stand, Squat Pivot Transfers Sit to Stand: Max assist, +2 physical assistance Squat pivot transfers: Max assist, +2 physical assistance General transfer comment: Use of bed pad with +2 assist to squat pivot EOB>chair. Able to perform sit to stand from chair x1 with use of bed pad to boost up Ambulation/Gait General Gait Details: unable at this time    ADL: ADL Overall ADL's : Needs assistance/impaired Eating/Feeding: NPO Grooming:  Maximal assistance, Sitting, Wash/dry face Grooming Details (indicate cue type and reason): Hand over hand assist with max cues to sustain activity Toilet Transfer: Maximal assistance, +2 for physical assistance, Squat-pivot Toilet Transfer Details (indicate cue type and reason): Simulated by squat pivot transfer EOB>chair Functional mobility during ADLs: Maximal assistance, +2 for physical assistance General ADL Comments: Total assist overall for ADL   Cognition: Cognition Overall Cognitive Status: Difficult to assess Arousal/Alertness: Awake/alert Orientation Level: Other (comment) (global aphasia) Attention: Focused Focused Attention: Impaired Safety/Judgment: Impaired Cognition Arousal/Alertness: Lethargic Behavior During Therapy: Flat affect Overall Cognitive Status: Difficult to assess Area of Impairment: Following commands, Problem solving Following Commands: Follows one step commands inconsistently (~50% of the time) Problem Solving: Slow processing, Decreased initiation, Difficulty sequencing, Requires verbal cues, Requires tactile cues General Comments: pt very response to friend [name], max v/c's to maintain eye opening Difficult to assess due to: Impaired communication  Blood pressure 124/76, pulse (!) 50, temperature 98.7 F (37.1 C), temperature source Axillary, resp. rate 14, height  (1.778 m), weight 95.7 kg (210 lb 15.7 oz), SpO2 98 %. Physical Exam  Vitals reviewed. Constitutional: He appears  well-developed and well-nourished.  He has pulled out his bridled nasogastric tube.   HENT:  Head: Normocephalic and atraumatic.  Eyes: Right eye exhibits no discharge. Left eye exhibits no discharge.  Pupils reactive to light  Neck: Normal range of motion. Neck supple. No thyromegaly present.  Cardiovascular: Normal rate and regular rhythm.   Respiratory: Effort normal and breath sounds normal. No respiratory distress.  GI: Soft. Bowel sounds are normal. He exhibits  no distension.  Musculoskeletal: He exhibits no edema or tenderness.  Neurological: He is alert.  Patient is very agitated.  Bilateral mittens in place for safety.  Left gaze preference.  Patient is aphasic which limits exam Motor: Moving LUE/LLE spontaneously, likely 5/5 No movement notes RUE/RLE Left facial weakness  Skin: Skin is warm and dry.  Psychiatric:  Unable to assess due to mentation    Results for orders placed or performed during the hospital encounter of 05/16/17 (from the past 24 hour(s))  Glucose, capillary     Status: None   Collection Time: 05/25/17  8:08 AM  Result Value Ref Range   Glucose-Capillary 85 65 - 99 mg/dL  Sodium     Status: None   Collection Time: 05/25/17  8:58 AM  Result Value Ref Range   Sodium 145 135 - 145 mmol/L  Glucose, capillary     Status: Abnormal   Collection Time: 05/25/17 12:03 PM  Result Value Ref Range   Glucose-Capillary 121 (H) 65 - 99 mg/dL  Glucose, capillary     Status: Abnormal   Collection Time: 05/25/17  2:57 PM  Result Value Ref Range   Glucose-Capillary 132 (H) 65 - 99 mg/dL  Sodium     Status: Abnormal   Collection Time: 05/25/17  3:00 PM  Result Value Ref Range   Sodium 147 (H) 135 - 145 mmol/L  Glucose, capillary     Status: Abnormal   Collection Time: 05/25/17  7:42 PM  Result Value Ref Range   Glucose-Capillary 106 (H) 65 - 99 mg/dL  Sodium     Status: None   Collection Time: 05/25/17  9:50 PM  Result Value Ref Range   Sodium 143 135 - 145 mmol/L  Glucose, capillary     Status: Abnormal   Collection Time: 05/25/17 11:12 PM  Result Value Ref Range   Glucose-Capillary 100 (H) 65 - 99 mg/dL  Glucose, capillary     Status: Abnormal   Collection Time: 05/26/17  3:13 AM  Result Value Ref Range   Glucose-Capillary 115 (H) 65 - 99 mg/dL  Basic metabolic panel     Status: Abnormal   Collection Time: 05/26/17  3:14 AM  Result Value Ref Range   Sodium 146 (H) 135 - 145 mmol/L   Potassium 3.1 (L) 3.5 - 5.1  mmol/L   Chloride 118 (H) 101 - 111 mmol/L   CO2 23 22 - 32 mmol/L   Glucose, Bld 122 (H) 65 - 99 mg/dL   BUN 18 6 - 20 mg/dL   Creatinine, Ser 1.61 0.61 - 1.24 mg/dL   Calcium 8.1 (L) 8.9 - 10.3 mg/dL   GFR calc non Af Amer >60 >60 mL/min   GFR calc Af Amer >60 >60 mL/min   Anion gap 5 5 - 15  Magnesium     Status: None   Collection Time: 05/26/17  3:14 AM  Result Value Ref Range   Magnesium 2.1 1.7 - 2.4 mg/dL  CBC     Status: Abnormal   Collection Time: 05/26/17  3:14 AM  Result Value Ref Range   WBC 9.1 4.0 - 10.5 K/uL   RBC 4.34 4.22 - 5.81 MIL/uL   Hemoglobin 12.1 (L) 13.0 - 17.0 g/dL   HCT 16.1 (L) 09.6 - 04.5 %   MCV 84.3 78.0 - 100.0 fL   MCH 27.9 26.0 - 34.0 pg   MCHC 33.1 30.0 - 36.0 g/dL   RDW 40.9 81.1 - 91.4 %   Platelets 283 150 - 400 K/uL  Phosphorus     Status: None   Collection Time: 05/26/17  3:14 AM  Result Value Ref Range   Phosphorus 2.5 2.5 - 4.6 mg/dL   Ct Head Wo Contrast  Addendum Date: 05/24/2017   ADDENDUM REPORT: 05/24/2017 17:59 ADDENDUM: Study discussed by telephone with Dr. Marvel Plan on 05/24/2017 at 1730 hours. Electronically Signed   By: Odessa Fleming M.D.   On: 05/24/2017 17:59   Result Date: 05/24/2017 CLINICAL DATA:  56 y/o  male with large left MCA infarct EXAM: CT HEAD WITHOUT CONTRAST TECHNIQUE: Contiguous axial images were obtained from the base of the skull through the vertex without intravenous contrast. COMPARISON:  Brain MRI 05/21/2017 and earlier. FINDINGS: Brain: Cytotoxic edema throughout the left MCA territory with increased intracranial mass effect since the head CT on 05/18/2017. Rightward midline shift now 14 mm (12 mm on 05/21/2017). Increased lateral and third ventricle effacement but no ventriculomegaly. Petechial hemorrhage as seen by MRI. No malignant hemorrhagic transformation. Some loss of patency of the left ambient cistern but other basilar cisterns remain patent. Gray-white matter differentiation outside of the left MCA  territory remains normal. Vascular: Calcified atherosclerosis at the skull base. Skull: Intact.  No acute osseous abnormality identified. Sinuses/Orbits: Right NG tube or feeding tube in place. Visualized paranasal sinuses and mastoids are stable and well pneumatized. Other: Visualized orbits and scalp soft tissues are within normal limits. IMPRESSION: 1. Continued increased intracranial mass effect from the large left MCA territory infarct. Rightward midline shift now 14 mm (increased from 12 mm on 05/21/2017). 2. Petechial hemorrhage as seen by MRI, but no malignant hemorrhagic transformation. 3. No new intracranial abnormality. Electronically Signed: By: Odessa Fleming M.D. On: 05/24/2017 17:28   Dg Chest Port 1 View  Result Date: 05/25/2017 CLINICAL DATA:  Acute respiratory failure. EXAM: PORTABLE CHEST 1 VIEW COMPARISON:  Radiograph of June 10, 2017. FINDINGS: Stable cardiomediastinal silhouette. Stable position of feeding tube. Right internal jugular catheter is unchanged. No pneumothorax is noted. Hypoinflation of the lungs is noted with mild bibasilar subsegmental atelectasis. No significant pleural effusion is noted. Bony thorax is unremarkable. IMPRESSION: Stable support apparatus. Hypoinflation of the lungs with mild bibasilar subsegmental atelectasis. Electronically Signed   By: Lupita Raider, M.D.   On: 05/25/2017 07:22   Dg Chest Port 1 View  Result Date: 05/24/2017 CLINICAL DATA:  Central line placed EXAM: PORTABLE CHEST 1 VIEW COMPARISON:  05/23/2017 FINDINGS: RIGHT central venous line tip in distal SVC. No pneumothorax appreciated. Feeding tube extends the stomach. Lungs are clear. IMPRESSION: RIGHT central venous line with tip in distal SVC.  No pneumothorax. Electronically Signed   By: Genevive Bi M.D.   On: 05/24/2017 20:13    Assessment/Plan: Diagnosis: Left MCA CVA Labs and images independently reviewed.  Records reviewed and summated above. Stroke: Continue secondary stroke  prophylaxis and Risk Factor Modification listed below:   Antiplatelet therapy:   Blood Pressure Management: Continue current medication with prn's with permisive HTN per primary team Statin Agent:   Prediabetes  management:   Right sided hemiparesis: fit for orthosis to prevent contractures (resting hand splint for day, wrist cock up splint at night, PRAFO, etc) Motor recovery: Fluoxetine  1. Does the need for close, 24 hr/day medical supervision in concert with the patient's rehab needs make it unreasonable for this patient to be served in a less intensive setting? Yes  2. Co-Morbidities requiring supervision/potential complications: CAD/STEMI (cont meds), HTN (monitor and provide prns in accordance with increased physical exertion and pain), cocaine abuse (counsel when appropriate), hypernatremia (cont to monitor, wean hypertonic saline when appropriate), agitation (wean IV meds when appropriate), hypokalemia (continue to monitor and replete as necessary), ABLA (transfuse if necessary to ensure appropriate perfusion for increased activity tolerance) 3. Due to bladder management, bowel management, safety, skin/wound care, disease management, medication administration, pain management and patient education, does the patient require 24 hr/day rehab nursing? Yes 4. Does the patient require coordinated care of a physician, rehab nurse, PT (1-2 hrs/day, 5 days/week), OT (1-2 hrs/day, 5 days/week) and SLP (1-2 hrs/day, 5 days/week) to address physical and functional deficits in the context of the above medical diagnosis(es)? Yes Addressing deficits in the following areas: balance, endurance, locomotion, strength, transferring, bowel/bladder control, bathing, dressing, feeding, grooming, toileting, cognition, speech, language, swallowing and psychosocial support 5. Can the patient actively participate in an intensive therapy program of at least 3 hrs of therapy per day at least 5 days per week?  Potentially 6. The potential for patient to make measurable gains while on inpatient rehab is excellent 7. Anticipated functional outcomes upon discharge from inpatient rehab are min assist and mod assist  with PT, min assist and mod assist with OT, supervision and min assist with SLP. 8. Estimated rehab length of stay to reach the above functional goals is: 22-27 days. 9. Anticipated D/C setting: TBD 10. Anticipated post D/C treatments: SNF 11. Overall Rehab/Functional Prognosis: good  RECOMMENDATIONS: This patient's condition is appropriate for continued rehabilitative care in the following setting: CIR when medically stable to decrease burden of care. Patient has agreed to participate in recommended program. Potentially Note that insurance prior authorization may be required for reimbursement for recommended care.  Comment: Rehab Admissions Coordinator to follow up.  Maryla Morrow, MD, ABPMR Mariam Dollar J., PA-C 05/26/2017

## 2017-05-26 NOTE — Progress Notes (Signed)
Cortrak Tube Team Note:  Consult received to place a Cortrak feeding tube.   Pt had Cortrak tube placed 10/13 and pulled tube 10/15.   RD attempted Cortrak tube re-placement. Pt combative despite bilateral mitts and medication. Pt showed to be bleeding from the nose and mouth. To prevent any injury RD stopped treatment.   Recommend IR tube placement if tube feeding warranted.   Vanessa Kick RD, LDN Clinical Nutrition Pager # [phone]

## 2017-05-26 NOTE — Progress Notes (Signed)
SLP Cancellation Note  Patient Details Name: Henry David MRN: [id number] DOB: 1961-07-20   Cancelled treatment:       Reason Eval/Treat Not Completed: Medical issues which prohibited therapy; agitation requiring sedatives.  Henry Lango MA, CCC-SLP [phone]    Henry David 05/26/2017, 9:43 AM

## 2017-05-26 NOTE — Progress Notes (Signed)
STROKE TEAM PROGRESS NOTE   SUBJECTIVE (INTERVAL HISTORY) No family members present. RN at bedside. Pt continues to be agitated and restless requiring sedation. He pulled out his cortrack feeding tube. His serum sodium is 146 on 75 mL/h 3% saline drip.   OBJECTIVE Temp:  [98 F (36.7 C)-99.6 F (37.6 C)] 98.1 F (36.7 C) (10/15 1104) Pulse Rate:  [50-78] 67 (10/15 1000) Cardiac Rhythm: Normal sinus rhythm (10/15 0800) Resp:  [14-24] 24 (10/15 1000) BP: (107-172)/(59-104) 141/74 (10/15 1000) SpO2:  [91 %-98 %] 97 % (10/15 1000) Weight:  [210 lb 15.7 oz (95.7 kg)] 210 lb 15.7 oz (95.7 kg) (10/15 0324)  CBC:   Recent Labs Lab 05/25/17 0315 05/26/17 0314  WBC 8.7 9.1  HGB 12.3* 12.1*  HCT 36.3* 36.6*  MCV 83.8 84.3  PLT 264 283    Basic Metabolic Panel:   Recent Labs Lab 05/20/17 0516  05/25/17 0315  05/25/17 2150 05/26/17 0314  NA  --   < > 144  < > 143 146*  K  --   < > 3.2*  --   --  3.1*  CL  --   < > 118*  --   --  118*  CO2  --   < > 22  --   --  23  GLUCOSE  --   < > 107*  --   --  122*  BUN  --   < > 18  --   --  18  CREATININE  --   < > 0.67  --   --  0.69  CALCIUM  --   < > 8.0*  --   --  8.1*  MG 2.1  --  2.3  --   --  2.1  PHOS 3.5  --   --   --   --  2.5  < > = values in this interval not displayed.  Lipid Panel:     Component Value Date/Time   CHOL 186 05/17/2017 0500   CHOL 114 12/12/2016 1118   TRIG 532 (H) 05/17/2017 0500   HDL 26 (L) 05/17/2017 0500   HDL 27 (L) 12/12/2016 1118   CHOLHDL 7.2 05/17/2017 0500   VLDL UNABLE TO CALCULATE IF TRIGLYCERIDE OVER 400 mg/dL 29/92/4268 3419   LDLCALC UNABLE TO CALCULATE IF TRIGLYCERIDE OVER 400 mg/dL 62/22/9798 9211   LDLCALC 40 12/12/2016 1118   HgbA1c:  Lab Results  Component Value Date   HGBA1C 6.1 (H) 05/17/2017   Urine Drug Screen:     Component Value Date/Time   LABOPIA NONE DETECTED 05/16/2017 1902   COCAINSCRNUR POSITIVE (A) 05/16/2017 1902   LABBENZ POSITIVE (A) 05/16/2017 1902    AMPHETMU NONE DETECTED 05/16/2017 1902   THCU NONE DETECTED 05/16/2017 1902   LABBARB NONE DETECTED 05/16/2017 1902    Alcohol Level     Component Value Date/Time   ETH <10 05/16/2017 1535    IMAGING I have personally reviewed the radiological images below and agree with the radiology interpretations.  [name] 05/21/2017 1. Large evolving acute ischemic infarct involving essentially the entirety of the left MCA territory, similar to previous exams. Associated petechial hemorrhage without frank hemorrhagic transformation, most notable at the left lentiform nucleus. 2. Associated mass effect with 12 mm of left-to-right midline shift, worsened relative to most recent CT from 05/18/2017. No hydrocephalus or ventricular trapping.  [name] 05/21/2017 1. Occlusion of the left ICA at the level of the terminus with  absent perfusion within the left middle cerebral artery. 2. Patent anterior communicating artery with perfusion of both anterior cerebral arteries, and minimal patchy retrograde flow within the left A1 segment. 3. Widely patent right middle cerebral artery and posterior Circulation.  Ct Head Wo Contrast 05/16/2017 IMPRESSION:  1. Large left MCA nonhemorrhagic infarct with minimal sparing of super ganglionic cortex.  2. Mass effect with effacement of the sulci and partial effacement of the left lateral ventricle.  3. ASPECTS is 10/10   05/18/2017 Unchanged appearance of cytotoxic edema throughout the left MCA territory with unchanged rightward midline shift measuring 4 mm. No hemorrhage. No ventricular entrapment or hydrocephalus.  05/17/2017 Large acute infarct in the distribution of the left middle cerebral artery.  Since the previous study, there is increasing mass effect with increased effacement of the left lateral ventricle.  Mild developing left-to-right midline shift of about 4 mm.  No acute intracranial hemorrhage.    05/24/2017    Rightward midline shift now 14 mm (increased from 12 mm on 05/21/2017). 2. Petechial hemorrhage as seen by MRI, but no malignant hemorrhagic transformation. 3. No new intracranial abnormality. Electronically Signed: By: Odessa Fleming M.D. On: 05/24/2017 17:28   Ct Cerebral Perfusion W Contrast 05/16/2017 IMPRESSION:  Large left MCA territory nonhemorrhagic infarct neck exceeds size criteria for intra-arterial intervention.  Ct Angio Head W Or Wo Contrast Ct Angio Neck W Or Wo Contrast 05/16/2017 IMPRESSION:  1. 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  2. No significant collateral flow to the left MCA territory.  3. Patent anterior communicating artery with retrograde filling of the left A1 segment.  4. Minimal atherosclerotic calcification within the cavernous right internal carotid artery.  5. No other significant atherosclerotic disease within the head or neck.  6. Degenerative changes of the cervical spine are most pronounced at C6-7.   Dg Chest Port 1 View 05/19/2017 Stable hypoinflation.  No pneumonia nor significant pulmonary edema. The support tubes are in reasonable position.  05/16/2017 1. Low lung volumes.  2. Satisfactory positioning of support apparatus as above.   05/23/17 1. Low lung volumes. 2. Mild pulmonary vascular congestion and small effusions may reflect neurogenic edema versus cardiac. 3. Minimal bibasilar airspace disease likely reflects atelectasis.  Dg Chest Port 1 View 05/25/2017 IMPRESSION: Stable support apparatus. Hypoinflation of the lungs with mild bibasilar subsegmental atelectasis.   Transthoracic Echocardiogram 05/17/2017 LVEF 45-50% Akinesis of the mid-apical anteroseptal, apical inferoseptal, and apical myocardium. Hypokinesis of the apical inferior myocardium.  TEE pending Monday   PHYSICAL EXAM Vitals:   05/26/17 0800 05/26/17 0900 05/26/17 1000 05/26/17 1104  BP:  (!) 164/96 137/86 (!) 141/74   Pulse: 78 (!) 59 67   Resp: 20 (!) 21 (!) 24   Temp: 98.2 F (36.8 C)   98.1 F (36.7 C)  TempSrc: Axillary     SpO2: 97% 94% 97%   Weight:      Height:        Physical exam: General - awake alert, global aphasic  Mental Status - awake alert, spontaneously and moving left side, unable to answer questions or follow commands. Global aphasia. Questionable follow commands of eye opening and closing.  Cranial Nerves II - XII - II - no blinking to visual threat from right III, IV, VI - Left gaze preference but able to cross midline. VII - Right facial droop XII - tongue midline  Motor Strength - Left sided movements spontaneous  at least 4/5 strength, right UE mild withdraw on pain, RLE 2/5 with pain stimulation   Motor Tone - Flaccid on RUE and RLE, normal LLE and LUE  Reflexes - Positive babinski on R  Sensation, coordination and gait not able to test.   ASSESSMENT/PLAN Henry David is a 56 y.o. male with history of anxiety, depression, cocaine use, history of apical mural thrombus, hypertension, hyperlipidemia and coronary artery disease, presenting with aphasia and right sided weakness. He did not receive IV t-PA due to late presentation.  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 Mild developing left-to-right midline shift of about 4 mm.   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  MRI head - left malignant MCA infarct with midline shift about 10mm  MRA head - left ICA terminus persistent occlusion, no left MCA visualized   2D Echo - LVEF  45% to 50%  LDL - 26  HgbA1c - 6.1  VTE prophylaxis NPO on TF @ 15  aspirin 81 mg daily and clopidogrel 75 mg daily prior to admission, now on ASA  and clopidogrel   Ongoing aggressive stroke risk factor management  Therapy recommendations:  - pending  Disposition:  Pending  Cerebral Edema  CT and MRI showed midline shift up to 10mm  Hypertonic saline - weaned off 05/23/17  However, CT repeat 05/24/17 showed progressive midline shift  3% restarted @ 75 with one dose of mannitol and 23.4%  Na 149->143->141->144  Na Q6h  Hx of apical thrombus  12/2015 TTE showed an apical thrombus on definity evaluation, EF 40-45%  Started on coumadin in 12/2015  Repeat TTE 06/2016 EF 40-45% no evidence of thrombus  Coumadin stopped on 07/2016  Recommend TEE to evaluate apical thrombus given the current possible embolic stroke - scheduled Monday 05/26/17  Delirium  Pt agitated and encephalopathic 05/22/17  Concerning for substance vs. Alcohol withdraw  on precedex, wean off as able  on ativan and risperdal bid with NG  On FA/B1/MVI  CCM on board  Hypertension  Stable, BP at low side  Gradually decrease BP to goal   Long-term BP goal 130-150 due to left ICA occlusion  Hyperlipidemia  Home meds:  Lipitor 80 mg daily - resumed in hospital  LDL 26, goal < 70  Continue statin at discharge  Cocaine abuse  UDS positive for cocaine  Cessation counseling will be provided  Dysphagia   Did not pass swallow  Had cortrak placed  Continue TF @ 15  Other Stroke Risk Factors  Former cigarette smoker - quit  ETOH use, will be advised to drink no more than 1 drink per day.  Obesity, Body mass index is 30.27 kg/m., recommend weight loss, diet and exercise as appropriate   Coronary artery disease s/p stent on DAPT  Other Active Problems  Hypokalemia -> replacing  CXR stable  Plan reinsert Cortrack tube. Repeat CT scan of the head tomorrow.  Continue hypertonic saline and follow sodium. I do not believe reintegrating patient to do TEE is a good idea.no family or friends available at the bedside  Hospital day #  10  This patient is critically ill due to left malignant MCA, cerebral edema, history of apical mural thrombus and at significant risk of neurological worsening, death form brain herniation, heart failure, recurrent stroke, seizure. This patient's care requires constant monitoring of vital signs, hemodynamics, respiratory and cardiac monitoring, review of multiple databases, neurological assessment, discussion with family, other specialists and medical decision making of high complexity. I spent 32 minutes of neurocritical care time in the care of this patient.  Delia Heady, MD Stroke Neurology 05/26/2017 12:00 PM     To contact Stroke Continuity provider, please refer to [email or web address]. After hours, contact General Neurology

## 2017-05-26 NOTE — Procedures (Signed)
ELECTROENCEPHALOGRAM REPORT  Date of Study: 05/26/2017  Patient's Name: Henry David MRN: [id number] Date of Birth: 1960/10/28  Referring Provider: Dr. Delia Heady  Clinical History: This is a 56 year old man with agitation, altered mental status.  Medications: acetaminophen (TYLENOL) tablet 650 mg  aspirin tablet 325 mg  atorvastatin (LIPITOR) tablet 80 mg  bisacodyl (DULCOLAX) suppository 10 mg  clopidogrel (PLAVIX) tablet 75 mg  enoxaparin (LOVENOX) injection 40 mg  famotidine (PEPCID) tablet 20 mg  feeding supplement (OSMOLITE 1.2 CAL) liquid 1,000 mL  feeding supplement (PRO-STAT SUGAR FREE 64) liquid 30 mL  folic acid (FOLVITE) tablet 1 mg  insulin aspart (novoLOG) injection 0-15 Units  multivitamin (PROSIGHT) tablet 1 tablet  senna-docusate (Senokot-S) tablet 1 tablet  thiamine (VITAMIN B-1) tablet 100 mg   Technical Summary: A multichannel digital EEG recording measured by the international 10-20 system with electrodes applied with paste and impedances below 5000 ohms performed as portable with EKG monitoring in an awake patient.  Hyperventilation and photic stimulation were not performed.  The digital EEG was referentially recorded, reformatted, and digitally filtered in a variety of bipolar and referential montages for optimal display.   Description: The patient is awake during the recording.  During maximal wakefulness, there is an asymmetric, medium voltage 7 Hz posterior dominant rhythm better formed over the right occipital region. There is background asymmetry noted with suppression of faster frequencies over the left hemisphere. There is occasional focal theta slowing over the right temporal region. Sleep was not captured. Hyperventilation and photic stimulation were not performed.  There were no epileptiform discharges or electrographic seizures seen.    EKG lead was unremarkable.  Impression: This awake EEG is abnormal due to the presence of: 1. Slowing of  the posterior dominant rhythm 2. Suppression of faster frequencies over the left hemispere 3. Occasional focal slowing over the right temporal region  Clinical Correlation of the above findings indicates bilateral cerebral dysfunction that is non-specific in etiology and can be seen with hypoxic/ischemic injury, toxic/metabolic encephalopathies, or medication effect.  The absence of epileptiform discharges does not rule out a clinical diagnosis of epilepsy.  Clinical correlation is advised.   Patrcia Dolly, M.D.

## 2017-05-26 NOTE — Progress Notes (Signed)
[facility] ADULT ICU REPLACEMENT PROTOCOL FOR AM LAB REPLACEMENT ONLY  The patient does apply for the [facility] Adult ICU Electrolyte Replacment Protocol based on the criteria listed below:   1. Is GFR >/= 40 ml/min? Yes.    Patient's GFR today is >60 2. Is urine output >/= 0.5 ml/kg/hr for the last 6 hours? Yes.   Patient's UOP is 1.4 ml/kg/hr 3. Is BUN < 60 mg/dL? Yes.    Patient's BUN today is 18 4. Abnormal electrolyte(s): k 3.1 5. Ordered repletion with: protocol 6. If a panic level lab has been reported, has the CCM MD in charge been notified? No..   Physician:    Markus Daft A 05/26/2017 5:16 AM

## 2017-05-26 NOTE — Progress Notes (Signed)
Bedside EEG completed, results pending. 

## 2017-05-26 NOTE — Progress Notes (Signed)
Nutrition Follow-up  DOCUMENTATION CODES:   Obesity unspecified  INTERVENTION:   Pt no longer receiving dysphagia diet, once enteral access achieved will start Osmolite 1.2 @ 65 ml/hr 30 ml Prostat BID Provides: 2072 kcal, 116 grams protein, and 1265 ml free water.    NUTRITION DIAGNOSIS:   Inadequate oral intake related to  (agitation) as evidenced by NPO status. Ongoing.   GOAL:   Patient will meet greater than or equal to 90% of their needs Progressing   MONITOR:   TF tolerance, PO intake, Supplement acceptance  ASSESSMENT:   56 y/o male PMHx CAD, Anxiety, Depression, HTN, PTSD, MI, drug and etoh abuse. Roommate found patient unresponsive. Arrived via EMS with R hemiplegia. Worked up for large, evolving MCA infarct. Intubated for airway protection  Pt discussed during ICU rounds and with RN.  Pt pulled out Cortrak tube, replacement unsuccessful due to agitation. Possible plan for TEE and may coordinate with PEG placement per RN/MD.   Medications reviewed and include: folvite, MVI, senokot-s, thiamine, 3% hypertonic saline (avoiding free water) Labs reviewed: Na 146 (H); CBG's: [phone]    Diet Order:      Skin:  Reviewed, no issues  Last BM:  10/15  Height:   Ht Readings from Last 1 Encounters:  05/16/17 5\' 10"  (1.778 m)    Weight:   Wt Readings from Last 1 Encounters:  05/26/17 210 lb 15.7 oz (95.7 kg)    Ideal Body Weight:  75.45 kg  BMI:  Body mass index is 30.27 kg/m.  Estimated Nutritional Needs:   Kcal:  2000-2200  Protein:  110-125 grams  Fluid:  > 2 L/day  EDUCATION NEEDS:   No education needs identified at this time  Kendell Bane RD, LDN, CNSC [phone] Pager [phone] After Hours Pager

## 2017-05-26 NOTE — Progress Notes (Signed)
PULMONARY / CRITICAL CARE MEDICINE   Name: Henry David MRN: [id number] DOB: August 10, 1961    ADMISSION DATE:  05/16/2017   CHIEF COMPLAINT:  Found Down  HISTORY OF PRESENT ILLNESS:   56 yo male found unresponsive and had large MCA infarct with occlusion of Lt carotid.  He was not a candidate for tPA or endovascular intervention. UDS positive for cocaine.  PMHx of CAD.  SUBJECTIVE:  More awake, now agitated.  Pulled out coretrak and would not allow replacement.    VITAL SIGNS: BP (!) 141/74   Pulse 67   Temp 98.2 F (36.8 C) (Axillary)   Resp (!) 24   Ht 5\' 10"  (1.778 m)   Wt 95.7 kg (210 lb 15.7 oz)   SpO2 97%   BMI 30.27 kg/m   VENTILATOR SETTINGS:  N/A  INTAKE / OUTPUT: I/O last 3 completed shifts: In: 4263.9 [I.V.:3038.9; NG/GT:1145; IV Piggyback:80] Out: 3925 [Urine:3775; Emesis/NG output:150]  PHYSICAL EXAMINATION:  General: ill appearing Neuro: awake, flaccid R, agitated intermittently, calm right now after versed  HEENT: mm moist, some UA noise, coughs up secretions adequately PULM: resps even non labored on [place], some referred UA noise CV: regular, no murmur GI: soft, nontender, positive bowel sounds Extremities:no significant lower extremity edema  LABS:  BMET  Recent Labs Lab 05/24/17 0232  05/25/17 0315  05/25/17 1500 05/25/17 2150 05/26/17 0314  NA 141  < > 144  < > 147* 143 146*  K 3.3*  --  3.2*  --   --   --  3.1*  CL 109  --  118*  --   --   --  118*  CO2 21*  --  22  --   --   --  23  BUN 15  --  18  --   --   --  18  CREATININE 0.85  --  0.67  --   --   --  0.69  GLUCOSE 101*  --  107*  --   --   --  122*  < > = values in this interval not displayed.  Electrolytes  Recent Labs Lab 05/20/17 0516  05/24/17 0232 05/25/17 0315 05/26/17 0314  CALCIUM  --   < > 8.5* 8.0* 8.1*  MG 2.1  --   --  2.3 2.1  PHOS 3.5  --   --   --  2.5  < > = values in this interval not displayed.  CBC  Recent Labs Lab 05/24/17 0232  05/25/17 0315 05/26/17 0314  WBC 10.3 8.7 9.1  HGB 14.3 12.3* 12.1*  HCT 41.7 36.3* 36.6*  PLT 299 264 283    Coag's No results for input(s): APTT, INR in the last 168 hours.  ABG  Recent Labs Lab 05/23/17 1743  PHART 7.469*  PCO2ART 28.6*  PO2ART 113*    Liver Enzymes No results for input(s): AST, ALT, ALKPHOS, BILITOT, ALBUMIN in the last 168 hours.  Cardiac Enzymes No results for input(s): TROPONINI, PROBNP in the last 168 hours.  Glucose  Recent Labs Lab 05/25/17 1203 05/25/17 1457 05/25/17 1942 05/25/17 2312 05/26/17 0313 05/26/17 0905  GLUCAP 121* 132* 106* 100* 115* 103*    Imaging No results found.  STUDIES:  CT Head 10/5 > Large left MCA nonhemorrhagic infarct with minimal sparing of super ganglionic cortex. Mass effect with effacement of the sulci and partial effacement of the left lateral ventricle. CTA Head 10/5 > occlusion of cavernous left internal carotid artery without reconstitution  of the terminal left ICA or left MCA. CT Head 10/7> L MCA cytotoxic edema with 4 mm midline shift   Echo 10/6> EF 45-50%, areas of akinesis, no PFO or mural thrombus noted   MRI/MRA Head 10/10 > Evolving L MCA territory infarct with worsened edema, 12 mm midline shift, Occlusion of L ICA CT head 10/13>>> 1. Continued increased intracranial mass effect from the large left MCA territory infarct. Rightward midline shift now 14 mm (increased from 12 mm on 05/21/2017). 2. Petechial hemorrhage as seen by MRI, but no malignant hemorrhagic transformation.  CULTURES: MRSA PCR 10/5 > Negative   ANTIBIOTICS: None   SIGNIFICANT EVENTS: 10/5 - Presents to ED with large Left MCA CVA  10/6 - 3% NS 10/9- Extubated  10/10 enceph bad, precedex  LINES/TUBES: ETT 10/5 >> 10/9 Right Subclavian CVC 10/5 >> 10/12 R IJ CVC 10/13 >>   ASSESSMENT / PLAN:  NEURO: 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 Cocaine  abuse. Severe encephalopathy, suspect toxic metabolic plus possible contribution of alcohol withdrawal Hx of depression, anxiety. PLAN -  Neuro following  Continue 3% saline.. Follow imaging as per neurology plans Continue aspirin, Plavix Continue Precedex  risperdal stopped 10/13 - now limited by lack of enteral access   PULM: Compromised airway d/t neuro status, extubated 10/9  Suspect some degree OSA  PLAN -  Continue pulmonary hygiene NTS if necessary Supplemental O2  Consider qhs CPAP if agitation improves -would NOT tolerate now   CV: Hypertension. Hx of CAD, HLD. Blood pressure currently at goal PLAN -  Continue Lipitor, Plavix, aspirin  ENDOCRINE: DM PLAN -  Sliding scale insulin per protocol  RENAL: Hypokalemia. Hyperchloremia mca cva with edema prior, therapeutic hypernatremia PLAN -  Plan for sodium goal 150, avoid free water at this time Replete K PRN  F/u chem   GI:  Dysphagia d/t CN palsy - c/b agitation.  Pulled out coretrak - too agitated for replacement.  PLAN -  Likely needs PEG but sedation will be difficult - hold off for today.  ??still considering TEE. If so would plan for intubation/anesthesia and do PEG at same time.  Continue Pepcid  HEME: - DVT ppx > enoxaparin PT consult  ID: No evidence of active infection PLAN -  Following off antibiotics    Dirk Dress, NP 05/26/2017  11:24 AM Pager: [phone] or [phone]

## 2017-05-26 NOTE — Progress Notes (Signed)
Physical Therapy Treatment Patient Details Name: Henry David MRN: [id number] DOB: 02-25-1961 Today's Date: 05/26/2017    History of Present Illness Pt is a 56 y.o. male with history of CAD, Cocaine use, Anxiety, Apical mural thromus, Depression, Essential HTN, PTSD, STEMI, Knee sx, Cardiac cath x3. Pt prsented with aphasia and R sided weakness. CT on 10/5 showed large L MCA infarct. CT on 10/13 showed increased intracranial mass effect with right midline shift now 14 mm.    PT Comments    Pt did surprisingly well, able to get supervision EOB for a few minutes with his left hand supported on rail and stood twice EOB.  RN did not feel he was safe to get to the chair as he continued to be combative earlier today.  When he does improve to the point that we can get him up to the chair, the stedy would be good for RN staff to use.   PT will continue to follow acutely to progress.  Left leg seems to have increased flexion tone in sitting EOB.   Follow Up Recommendations  CIR     Equipment Recommendations  Wheelchair (measurements PT);Wheelchair cushion (measurements PT);Hospital bed    Recommendations for Other Services   NA     Precautions / Restrictions Precautions Precautions: Fall Precaution Comments: pt in posey and L UE mit and wrist retraint, L LE restraint.     Mobility  Bed Mobility Overal bed mobility: Needs Assistance Bed Mobility: Supine to Sit;Sit to Supine     Supine to sit: Max assist;HOB elevated;+2 for physical assistance Sit to supine: +2 for physical assistance;Mod assist   General bed mobility comments: Two person max assist to get to EOB to initiate movement of legs and trunk to sitting, pt reaching with his left hand for support and ultimately bed rail once sitting. Once PT initiated trunk motion towards bed, he was able to lift his left leg up and needed assist to lift his right lege up into bed.   Transfers Overall transfer level: Needs  assistance Equipment used: 2 person hand held assist Transfers: Sit to/from Stand Sit to Stand: +2 physical assistance;Max assist;Mod assist         General transfer comment: First stand EOB was two person mod assist to support trunk and block right knee to prevent buckling, second stand was two person max assist to support trunk and keep right leg from buckling.  I do not think he has his hand on the bed rail for the second stand which may have made the difference in the assist level.    Modified Rankin (Stroke Patients Only) Modified Rankin (Stroke Patients Only) Pre-Morbid Rankin Score: No symptoms Modified Rankin: Severe disability     Balance Overall balance assessment: Needs assistance Sitting-balance support: Feet supported;Single extremity supported Sitting balance-Leahy Scale: Poor Sitting balance - Comments: pt could get up to close supervision for a few minutes with his left hand supported on bed rail.  If he removed his left hand he would have LOB to the right.  Postural control: Right lateral lean;Posterior lean Standing balance support: Single extremity supported Standing balance-Leahy Scale: Zero Standing balance comment: mod/max assist to stand.                            Cognition Arousal/Alertness: Awake/alert Behavior During Therapy: Flat affect Overall Cognitive Status: Difficult to assess Area of Impairment: Following commands;Safety/judgement;Awareness;Problem solving;Attention  Current Attention Level: Focused   Following Commands: Follows one step commands inconsistently;Follows one step commands with increased time Safety/Judgement: Decreased awareness of safety;Decreased awareness of deficits Awareness: Intellectual Problem Solving: Decreased initiation;Slow processing General Comments: Pt does well when motion is initiated first by PT he will follow, but I could not really get him to follow even basic commands on  his left side.          General Comments General comments (skin integrity, edema, etc.): RN reports pt was combative earlier and given sedative.  He was approrpiate with our breif session.  She did not feel he was safe to transfer to the chair.  Will re-assess this appropriateness daily.       Pertinent Vitals/Pain Pain Assessment: Faces Faces Pain Scale: No hurt           PT Goals (current goals can now be found in the care plan section) Acute Rehab PT Goals Patient Stated Goal: unable to state, did not verablize Progress towards PT goals: Progressing toward goals    Frequency    Min 4X/week      PT Plan Current plan remains appropriate       AM-PAC PT "6 Clicks" Daily Activity  Outcome Measure  Difficulty turning over in bed (including adjusting bedclothes, sheets and blankets)?: Unable Difficulty moving from lying on back to sitting on the side of the bed? : Unable Difficulty sitting down on and standing up from a chair with arms (e.g., wheelchair, bedside commode, etc,.)?: Unable Help needed moving to and from a bed to chair (including a wheelchair)?: A Lot Help needed walking in hospital room?: Total Help needed climbing 3-5 steps with a railing? : Total 6 Click Score: 7    End of Session   Activity Tolerance: Patient tolerated treatment well Patient left: in bed;with call bell/phone within reach;with bed alarm set Nurse Communication: Mobility status PT Visit Diagnosis: Difficulty in walking, not elsewhere classified (R26.2);Muscle weakness (generalized) (M62.81);Hemiplegia and hemiparesis Hemiplegia - Right/Left: Right Hemiplegia - dominant/non-dominant: Dominant Hemiplegia - caused by: Cerebral infarction     Time: 1610-9604 PT Time Calculation (min) (ACUTE ONLY): 22 min  Charges:  $Therapeutic Activity: 8-22 mins   Araceli Coufal B. Kasper Mudrick, PT, DPT [phone]           05/26/2017, 3:05 PM

## 2017-05-27 ENCOUNTER — Inpatient Hospital Stay (HOSPITAL_COMMUNITY): Payer: Medicaid Other

## 2017-05-27 LAB — CBC WITH DIFFERENTIAL/PLATELET
Basophils Absolute: 0 10*3/uL (ref 0.0–0.1)
Basophils Relative: 0 %
EOS ABS: 0.3 10*3/uL (ref 0.0–0.7)
EOS PCT: 4 %
HCT: 35.4 % — ABNORMAL LOW (ref 39.0–52.0)
Hemoglobin: 11.8 g/dL — ABNORMAL LOW (ref 13.0–17.0)
LYMPHS ABS: 2 10*3/uL (ref 0.7–4.0)
Lymphocytes Relative: 23 %
MCH: 28.1 pg (ref 26.0–34.0)
MCHC: 33.3 g/dL (ref 30.0–36.0)
MCV: 84.3 fL (ref 78.0–100.0)
Monocytes Absolute: 0.6 10*3/uL (ref 0.1–1.0)
Monocytes Relative: 7 %
Neutro Abs: 5.7 10*3/uL (ref 1.7–7.7)
Neutrophils Relative %: 66 %
PLATELETS: 311 10*3/uL (ref 150–400)
RBC: 4.2 MIL/uL — AB (ref 4.22–5.81)
RDW: 13.5 % (ref 11.5–15.5)
WBC: 8.5 10*3/uL (ref 4.0–10.5)

## 2017-05-27 LAB — BASIC METABOLIC PANEL
ANION GAP: 5 (ref 5–15)
BUN: 12 mg/dL (ref 6–20)
CO2: 22 mmol/L (ref 22–32)
Calcium: 8 mg/dL — ABNORMAL LOW (ref 8.9–10.3)
Chloride: 118 mmol/L — ABNORMAL HIGH (ref 101–111)
Creatinine, Ser: 0.67 mg/dL (ref 0.61–1.24)
GFR calc Af Amer: >60 mL/min (ref >60)
GFR calc non Af Amer: >60 mL/min (ref >60)
GLUCOSE: 111 mg/dL — AB (ref 65–99)
POTASSIUM: 3.3 mmol/L — AB (ref 3.5–5.1)
Sodium: 145 mmol/L (ref 135–145)

## 2017-05-27 LAB — GLUCOSE, CAPILLARY
GLUCOSE-CAPILLARY: 109 mg/dL — AB (ref 65–99)
GLUCOSE-CAPILLARY: 95 mg/dL (ref 65–99)
Glucose-Capillary: 113 mg/dL — ABNORMAL HIGH (ref 65–99)
Glucose-Capillary: 81 mg/dL (ref 65–99)
Glucose-Capillary: 93 mg/dL (ref 65–99)
Glucose-Capillary: 100 mg/dL — ABNORMAL HIGH (ref 65–99)

## 2017-05-27 LAB — PHOSPHORUS: Phosphorus: 3.5 mg/dL (ref 2.5–4.6)

## 2017-05-27 LAB — SODIUM
SODIUM: 146 mmol/L — AB (ref 135–145)
Sodium: 145 mmol/L (ref 135–145)
Sodium: 144 mmol/L (ref 135–145)

## 2017-05-27 MED ORDER — DIVALPROEX SODIUM 125 MG PO CSDR
500.0000 mg | DELAYED_RELEASE_CAPSULE | Freq: Three times a day (TID) | ORAL | Status: DC
  Filled 2017-05-27 (×3): qty 4

## 2017-05-27 MED ORDER — QUETIAPINE FUMARATE 50 MG PO TABS
50.0000 mg | ORAL_TABLET | Freq: Every day | ORAL | Status: DC
  Administered 2017-05-29 – 2017-06-01 (×4): 50 mg via ORAL
  Filled 2017-05-27 (×4): qty 1

## 2017-05-27 MED ORDER — CHLORHEXIDINE GLUCONATE 0.12 % MT SOLN
OROMUCOSAL | Status: AC
  Filled 2017-05-27: qty 15

## 2017-05-27 MED ORDER — POTASSIUM CHLORIDE 10 MEQ/100ML IV SOLN
10.0000 meq | INTRAVENOUS | Status: AC
  Administered 2017-05-27 (×2): 10 meq via INTRAVENOUS
  Filled 2017-05-27 (×2): qty 100

## 2017-05-27 MED ORDER — POTASSIUM CHLORIDE 10 MEQ/50ML IV SOLN: 10.0000 meq | INTRAVENOUS | Status: DC

## 2017-05-27 MED ORDER — POTASSIUM CHLORIDE 10 MEQ/50ML IV SOLN
10.0000 meq | INTRAVENOUS | Status: AC
  Administered 2017-05-27 (×2): 10 meq via INTRAVENOUS
  Filled 2017-05-27 (×2): qty 50

## 2017-05-27 NOTE — Progress Notes (Signed)
STROKE TEAM PROGRESS NOTE   SUBJECTIVE (INTERVAL HISTORY) No family members present. RN at bedside. Pt continues to be agitated and restless requiring sedation. His cortrack feeding tube has not been replaced . His serum sodium is 145 on 75 mL/h 3% saline drip. Follow-up CT scan of the head showed significant improvement in cytotoxic edema and midline shift from 14 now down to 7 mm  OBJECTIVE Temp:  [98.5 F (36.9 C)-99.8 F (37.7 C)] 99.4 F (37.4 C) (10/16 1200) Pulse Rate:  [48-87] 67 (10/16 1300) Cardiac Rhythm: Sinus bradycardia (10/16 0745) Resp:  [14-27] 16 (10/16 1300) BP: (123-176)/(65-108) 126/82 (10/16 1300) SpO2:  [91 %-100 %] 95 % (10/16 1300) Weight:  [205 lb 14.6 oz (93.4 kg)] 205 lb 14.6 oz (93.4 kg) (10/16 0500)  CBC:   Recent Labs Lab 05/26/17 0314 05/27/17 0412  WBC 9.1 8.5  NEUTROABS  --  5.7  HGB 12.1* 11.8*  HCT 36.6* 35.4*  MCV 84.3 84.3  PLT 283 311    Basic Metabolic Panel:   Recent Labs Lab 05/25/17 0315  05/26/17 0314  05/27/17 0412 05/27/17 1111  NA 144  < > 146*  < > 145 145  K 3.2*  --  3.1*  --  3.3*  --   CL 118*  --  118*  --  118*  --   CO2 22  --  23  --  22  --   GLUCOSE 107*  --  122*  --  111*  --   BUN 18  --  18  --  12  --   CREATININE 0.67  --  0.69  --  0.67  --   CALCIUM 8.0*  --  8.1*  --  8.0*  --   MG 2.3  --  2.1  --   --   --   PHOS  --   --  2.5  --  3.5  --   < > = values in this interval not displayed.  Lipid Panel:     Component Value Date/Time   CHOL 186 05/17/2017 0500   CHOL 114 12/12/2016 1118   TRIG 532 (H) 05/17/2017 0500   HDL 26 (L) 05/17/2017 0500   HDL 27 (L) 12/12/2016 1118   CHOLHDL 7.2 05/17/2017 0500   VLDL UNABLE TO CALCULATE IF TRIGLYCERIDE OVER 400 mg/dL 16/05/9603 5409   LDLCALC UNABLE TO CALCULATE IF TRIGLYCERIDE OVER 400 mg/dL 81/19/1478 2956   LDLCALC 40 12/12/2016 1118   HgbA1c:  Lab Results  Component Value Date   HGBA1C 6.1 (H) 05/17/2017   Urine Drug Screen:      Component Value Date/Time   LABOPIA NONE DETECTED 05/16/2017 1902   COCAINSCRNUR POSITIVE (A) 05/16/2017 1902   LABBENZ POSITIVE (A) 05/16/2017 1902   AMPHETMU NONE DETECTED 05/16/2017 1902   THCU NONE DETECTED 05/16/2017 1902   LABBARB NONE DETECTED 05/16/2017 1902    Alcohol Level     Component Value Date/Time   ETH <10 05/16/2017 1535    IMAGING I have personally reviewed the radiological images below and agree with the radiology interpretations.  [name] 05/21/2017 1. Large evolving acute ischemic infarct involving essentially the entirety of the left MCA territory, similar to previous exams. Associated petechial hemorrhage without frank hemorrhagic transformation, most notable at the left lentiform nucleus. 2. Associated mass effect with 12 mm of left-to-right midline shift, worsened relative to most recent CT from 05/18/2017. No hydrocephalus or ventricular trapping.  Mr [facility] Contrast  05/21/2017 1. Occlusion of the left ICA at the level of the terminus with absent perfusion within the left middle cerebral artery. 2. Patent anterior communicating artery with perfusion of both anterior cerebral arteries, and minimal patchy retrograde flow within the left A1 segment. 3. Widely patent right middle cerebral artery and posterior Circulation.  Ct Head Wo Contrast 05/16/2017 IMPRESSION:  1. Large left MCA nonhemorrhagic infarct with minimal sparing of super ganglionic cortex.  2. Mass effect with effacement of the sulci and partial effacement of the left lateral ventricle.  3. ASPECTS is 10/10   05/18/2017 Unchanged appearance of cytotoxic edema throughout the left MCA territory with unchanged rightward midline shift measuring 4 mm. No hemorrhage. No ventricular entrapment or hydrocephalus.  05/17/2017 Large acute infarct in the distribution of the left middle cerebral artery.  Since the previous study, there is increasing mass effect with increased  effacement of the left lateral ventricle.  Mild developing left-to-right midline shift of about 4 mm.  No acute intracranial hemorrhage.    05/24/2017   Rightward midline shift now 14 mm (increased from 12 mm on 05/21/2017). 2. Petechial hemorrhage as seen by MRI, but no malignant hemorrhagic transformation. 3. No new intracranial abnormality. Electronically Signed: By: Odessa Fleming M.D. On: 05/24/2017 17:28   Ct Cerebral Perfusion W Contrast 05/16/2017 IMPRESSION:  Large left MCA territory nonhemorrhagic infarct neck exceeds size criteria for intra-arterial intervention.  Ct Angio Head W Or Wo Contrast Ct Angio Neck W Or Wo Contrast 05/16/2017 IMPRESSION:  1. 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  2. No significant collateral flow to the left MCA territory.  3. Patent anterior communicating artery with retrograde filling of the left A1 segment.  4. Minimal atherosclerotic calcification within the cavernous right internal carotid artery.  5. No other significant atherosclerotic disease within the head or neck.  6. Degenerative changes of the cervical spine are most pronounced at C6-7.   Dg Chest Port 1 View 05/19/2017 Stable hypoinflation.  No pneumonia nor significant pulmonary edema. The support tubes are in reasonable position.  05/16/2017 1. Low lung volumes.  2. Satisfactory positioning of support apparatus as above.   05/23/17 1. Low lung volumes. 2. Mild pulmonary vascular congestion and small effusions may reflect neurogenic edema versus cardiac. 3. Minimal bibasilar airspace disease likely reflects atelectasis.  Dg Chest Port 1 View 05/25/2017 IMPRESSION: Stable support apparatus. Hypoinflation of the lungs with mild bibasilar subsegmental atelectasis.   Transthoracic Echocardiogram 05/17/2017 LVEF 45-50% Akinesis of the mid-apical anteroseptal, apical inferoseptal, and apical myocardium.  Hypokinesis of the apical inferior myocardium.      PHYSICAL EXAM Vitals:   05/27/17 1000 05/27/17 1100 05/27/17 1200 05/27/17 1300  BP: (!) 149/90 131/71 (!) 145/90 126/82  Pulse: 69 65 69 67  Resp: 20 (!) 27 20 16   Temp:   99.4 F (37.4 C)   TempSrc:   Oral   SpO2: 99% 94% 97% 95%  Weight:      Height:        Physical exam: General - awake alert, global aphasic  Mental Status - awake alert, spontaneously and moving left side, unable to answer questions or follow commands. Global aphasia. Questionable follow commands of eye opening and closing.  Cranial Nerves II - XII - II - no blinking to visual threat from right III, IV, VI - Left gaze preference but able to cross midline. VII - Right facial droop XII - tongue midline  Motor Strength - Left sided movements spontaneous at least 4/5 strength, right UE mild withdraw on pain, RLE 2/5 with pain stimulation   Motor Tone - Flaccid on RUE and RLE, normal LLE and LUE  Reflexes - Positive babinski on R  Sensation, coordination and gait not able to test.   ASSESSMENT/PLAN [name] is a 56 y.o. male with history of anxiety, depression, cocaine use, history of apical mural thrombus, hypertension, hyperlipidemia and coronary artery disease, presenting with aphasia and right sided weakness. He did not receive IV t-PA due to late presentation.  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 Mild developing left-to-right midline shift of about 4 mm.   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  MRI head - left malignant MCA  infarct with midline shift about 10mm  MRA head - left ICA terminus persistent occlusion, no left MCA visualized   2D Echo - LVEF 45% to 50%  LDL - 26  HgbA1c - 6.1  VTE prophylaxis NPO on TF @ 15  aspirin 81 mg daily and clopidogrel 75 mg daily prior to admission, now on ASA  and clopidogrel   Ongoing aggressive stroke risk factor management  Therapy recommendations:  - pending  Disposition:  Pending  Cerebral Edema  CT and MRI showed midline shift up to 10mm  Hypertonic saline - weaned off 05/23/17  However, CT repeat 05/24/17 showed progressive midline shift  3% restarted @ 75 with one dose of mannitol and 23.4%  Na 149->143->141->144  Na Q6h  Hx of apical thrombus  12/2015 TTE showed an apical thrombus on definity evaluation, EF 40-45%  Started on coumadin in 12/2015  Repeat TTE 06/2016 EF 40-45% no evidence of thrombus  Coumadin stopped on 07/2016  Recommend TEE to evaluate apical thrombus given the current possible embolic stroke - scheduled Monday 05/26/17  Delirium  Pt agitated and encephalopathic 05/22/17  Concerning for substance vs. Alcohol withdraw  on precedex, wean off as able  on ativan and risperdal bid with NG  On FA/B1/MVI  CCM on board  Hypertension  Stable, BP at low side  Gradually decrease BP to goal   Long-term BP goal 130-150 due to left ICA occlusion  Hyperlipidemia  Home meds:  Lipitor 80 mg daily - resumed in hospital  LDL 26, goal < 70  Continue statin at discharge  Cocaine abuse  UDS positive for cocaine  Cessation counseling will be provided  Dysphagia   Did not pass swallow  Had cortrak placed  Continue TF @ 15  Other Stroke Risk Factors  Former cigarette smoker - quit  ETOH use, will be advised to drink no more than 1 drink per day.  Obesity, Body mass index is 29.54 kg/m., recommend weight loss, diet and exercise as appropriate   Coronary artery disease s/p stent on DAPT  Other  Active Problems  Hypokalemia -> replacing  CXR stable  Plan reinsert Cortrack tube. Taper and discontinue hypertonic saline over the next 24 hours as I feel he is outside the peak edema phase as it has been more than 10 days since  this stroke.  Recommend minimizing sedation and letting patient be awake. Start Depakote for this agitation..no family or friends available at the bedside.discussed with critical care physician  Hospital day # 11  This patient is critically ill due to left malignant MCA, cerebral edema, history of apical mural thrombus and at significant risk of neurological worsening, death form brain herniation, heart failure, recurrent stroke, seizure. This patient's care requires constant monitoring of vital signs, hemodynamics, respiratory and cardiac monitoring, review of multiple databases, neurological assessment, discussion with family, other specialists and medical decision making of high complexity. I spent 32 minutes of neurocritical care time in the care of this patient.  Delia Heady, MD Stroke Neurology 05/27/2017 2:20 PM     To contact Stroke Continuity provider, please refer to [email or web address]. After hours, contact General Neurology

## 2017-05-27 NOTE — Progress Notes (Signed)
Precedex restarted and 5mg  of Haldol given prior to placing NG.  Attempted to place a small bore NG tube down patient's R nare.  Took 3 nurses and our NT to hold him down while in L arm and L leg restraint.  Pt coughing a ton, nose started bleeding and patient fighting and crying.  Notified MD of being unsuccessful.  Order to replace cortrak tomorrow.

## 2017-05-27 NOTE — Progress Notes (Signed)
PT Cancellation Note  Patient Details Name: Henry David MRN: [id number] DOB: 20-Nov-1960   Cancelled Treatment:    Reason Eval/Treat Not Completed: Patient at procedure or test/unavailable.  Pt in radiology for MBS.  PT will check back later today or tomorrow as time allows.  Thanks,    Rollene Rotunda. Jayme Mednick, PT, DPT [phone]   05/27/2017, 1:45 PM

## 2017-05-27 NOTE — Progress Notes (Addendum)
Modified Barium Swallow Progress Note  Patient Details  Name: LARSON RAITT MRN: [id number] Date of Birth: 04-07-1961  Today's Date: 05/27/2017  Modified Barium Swallow completed.  Full report located under Chart Review in the Imaging Section.  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 Patient with severe oral holding and severe delay in swallow initiation with resultant penetration of both pureed solids and honey thick liquids without ability to clear. Suspect component of apraxia in addition to poor sustained attention and awareness impacting ability to consistently initiate a pharyngeal swallow. With max clinician verbal, visual, and tactile cueing, including use of HOH assist for self feeding to increase bolus preparation and awareness, patient able to initiate a timely swallow with intact airway protection with honey thick liquid via tsp and pureed solid for a series of 5-6 boluses with intact pharyngeal strength for bolus clearance. Following this series however, patient with return to severe oral holding requiring manual oral suction/care to clear oral cavity without return ability to initiate a pharyngeal swallow despite cues. Unfortunately, this does not allow for safe or efficient po intake at this time. Prognosis for ability to resume a po diet good with improved mentation and cognitive-linguistic status.    SLP will f/u at bedside for diagnostic treatment. Based on intact pharyngeal strength/clearance and good airway protection when patient initiating a pharyngeal swallow, would consider diet advancement to dysphagia 1 (puree) with honey thick liquids at bedside without repeat MBS if  patient able to  demonstrate consistent pharyngeal swallow under clinician supervision during treatment.    Swallow Evaluation Recommendations       SLP Diet Recommendations: NPO;Alternative means - temporary       Medication Administration: Via alternative means               Oral Care Recommendations: Oral care QID      Ferdinand Lango MA, CCC-SLP [phone]   Mccauley Diehl Meryl 05/27/2017,1:58 PM

## 2017-05-27 NOTE — Progress Notes (Signed)
  Speech Language Pathology Treatment: Dysphagia;Cognitive-Linquistic  Patient Details Name: KEIDYN FAGG MRN: [id number] DOB: 06/10/1961 Today's Date: 05/27/2017 Time: 2446-2863 SLP Time Calculation (min) (ACUTE ONLY): 26 min  Assessment / Plan / Recommendation Clinical Impression  Cortrak again pulled out by patient. Goal of diagnostic treatment to determine potential to resume a po diet. Oral care provided with removal of dried lingual secretions. HOH assistance provided for self feeding to maximize safety with intake. Self feeding improved oral transit time from moderate-severely delayed to mild-moderate oral delay. Suspect as during FEES complete 10/10, patient with continued delay in swallow initiation with resultant s/s of decreased airway protection with honey thick liquids provided both via tsp and cup sip. Throat clearing and cough response noted post swallow in approximately 50% of trials. Pureed solids elicited what appeared to be improved airway protection without overt indication of aspiration however cannot r/o silent aspiration given neuro deficits, intubation related components, and cognition. As patient unlikely to tolerate NG tube in place with agitation requiring restraints and use of sedatives, will proceed with instrumental testing to determine potential to initiate conservative pos, suspect dysphagia 1 with pudding thick liquid if anything. MBS scheduled for 1300.   Patient continues to require total assistance for basic communication. Alert and eyes open with occasional smile in response to basic questioning. Patient with one episode of low intensity, unintelligible phonation at the phrase level but otherwise non-verbal despite max clinician cues including use of melodic intonation therapy tools, automatic speech tasks, verbal, and visual cueing. With moderate-max clinician cueing, patient able to use basic gestures with left upper extremity to make needs known x1 during  treatment today. Education complete with RN and MD regarding communication status and plan.    HPI HPI: Luian Nerison Amprazisis a 56 y.o.malewith history of anxiety, depression, diverticulitis, GERD, cocaine use,history of apical mural thrombus, hypertension, hyperlipidemia and coronary artery diseasepresenting with global aphasia, left gaze preference and right hemiplegia. Hedidnot receive IV t-PA due to late presentation. Intubated 10/5-10/9. Head MRI 10/10 shows large evolving acute ischemic infarct involving essentially the entirety of the left MCA territory, similar to previous exams and associated mass effect with 12 mm of left-to-right midline shift, worsened relative to most recent CT from 05/18/2017.       SLP Plan  MBS       Recommendations  Diet recommendations: NPO Medication Administration: Via alternative means                Oral Care Recommendations: Oral care QID Follow up Recommendations: Inpatient Rehab SLP Visit Diagnosis: Dysphagia, oropharyngeal phase (R13.12);Aphasia (R47.01) Plan: MBS       GO             Ferdinand Lango MA, CCC-SLP [phone]    Prestin Munch Meryl 05/27/2017, 11:15 AM

## 2017-05-27 NOTE — Progress Notes (Signed)
[facility] ADULT ICU REPLACEMENT PROTOCOL FOR AM LAB REPLACEMENT ONLY  The patient does apply for the [facility] Adult ICU Electrolyte Replacment Protocol based on the criteria listed below:   1. Is GFR >/= 40 ml/min? Yes.    Patient's GFR today is >60 2. Is urine output >/= 0.5 ml/kg/hr for the last 6 hours? Yes.   Patient's UOP is 0.6 ml/kg/hr 3. Is BUN < 60 mg/dL? Yes.    Patient's BUN today is 12 4. Abnormal electrolyte(s): K3.3 5. Ordered repletion with: per protocol 6. If a panic level lab has been reported, has the CCM MD in charge been notified? Yes.  .   Physician:  Laural Benes, MD  Melrose Nakayama 05/27/2017 5:33 AM

## 2017-05-27 NOTE — Progress Notes (Signed)
Inpatient Rehabilitation  Observed patient working with SLP and discussed case with bedside RN.  Patient with no known family to assist or help make decisions; however, will reach out to patient's emergency contact.  Left booklets at the bedside.  Plan to continue to follow for timing of medical readiness, therapy tolerance, and IP Rehab bed availability.  Please call with questions.   Charlane Ferretti., CCC/SLP Admission Coordinator  [facility] Inpatient Rehabilitation  Cell [phone]

## 2017-05-27 NOTE — Progress Notes (Signed)
PULMONARY / CRITICAL CARE MEDICINE   Name: Henry KEMPNER MRN: [id number] DOB: [birth year]    ADMISSION DATE:  05/16/2017   CHIEF COMPLAINT:  Found Down  HISTORY OF PRESENT ILLNESS:   56 yo male found unresponsive and had large MCA infarct with occlusion of Lt carotid.  He was not a candidate for tPA or endovascular intervention. UDS positive for cocaine.  PMHx of CAD.  SUBJECTIVE:  More awake, now agitated.  Pulled out coretrak 10/15 and would not allow replacement. Limited enteral access. For swallow eval 10/16. Overnight 10/16 is combative and striking out at staff.   VITAL SIGNS: BP 123/85   Pulse (!) 48   Temp 99.4 F (37.4 C) (Oral)   Resp 17   Ht  (1.778 m)   Wt 205 lb 14.6 oz (93.4 kg)   SpO2 91%   BMI 29.54 kg/m   VENTILATOR SETTINGS:  N/A  INTAKE / OUTPUT: I/O last 3 completed shifts: In: 3765.1 [I.V.:3215.1; NG/GT:400; IV Piggyback:150] Out: 4550 [Urine:4550]  PHYSICAL EXAMINATION:  General: Awake and alert, supine in bed. Following no commands, combative Neuro: awake, flaccid R, agitated intermittently, currently calm after bath  HEENT: normocephalic, atraumatic mm moist,  protecting airway with strong cough PULM: resps even non labored on RA, some referred UA noise CV: S1, S2 RRR, No RMG GI: soft, nontender, positive bowel sounds Extremities:no significant lower extremity edema  LABS:  BMET  Recent Labs Lab 05/25/17 0315  05/26/17 0314  05/26/17 1808 05/26/17 2350 05/27/17 0412  NA 144  < > 146*  < > 146* 146* 145  K 3.2*  --  3.1*  --   --   --  3.3*  CL 118*  --  118*  --   --   --  118*  CO2 22  --  23  --   --   --  22  BUN 18  --  18  --   --   --  12  CREATININE 0.67  --  0.69  --   --   --  0.67  GLUCOSE 107*  --  122*  --   --   --  111*  < > = values in this interval not displayed.  Electrolytes  Recent Labs Lab 05/25/17 0315 05/26/17 0314 05/27/17 0412  CALCIUM 8.0* 8.1* 8.0*  MG 2.3 2.1  --   PHOS  --  2.5 3.5     CBC  Recent Labs Lab 05/25/17 0315 05/26/17 0314 05/27/17 0412  WBC 8.7 9.1 8.5  HGB 12.3* 12.1* 11.8*  HCT 36.3* 36.6* 35.4*  PLT 264 283 311    Coag's No results for input(s): APTT, INR in the last 168 hours.  ABG  Recent Labs Lab 05/23/17 1743 05/23/17 1802  PHART 7.469* 7.419  PCO2ART 28.6* 35.9  PO2ART 113* 27.0*    Liver Enzymes No results for input(s): AST, ALT, ALKPHOS, BILITOT, ALBUMIN in the last 168 hours.  Cardiac Enzymes No results for input(s): TROPONINI, PROBNP in the last 168 hours.  Glucose  Recent Labs Lab 05/26/17 1122 05/26/17 1507 05/26/17 1925 05/26/17 2312 05/27/17 0318 05/27/17 0734  GLUCAP 91 73 105* 100* 113* 81    Imaging Ct Head Wo Contrast  Result Date: 05/26/2017 CLINICAL DATA:  Follow-up examination for stroke. EXAM: CT HEAD WITHOUT CONTRAST TECHNIQUE: Contiguous axial images were obtained from the base of the skull through the vertex without intravenous contrast. COMPARISON:  Prior CT from 05/24/2017. FINDINGS: Brain: Continued interval evolution  of large left MCA territory infarct. Improved mass effect as compared to most recent CT from 05/24/2017 with decreased left-to-right midline shift now measuring 7 mm, previously 14 mm. Continued partial effacement of the left lateral ventricle. No hydrocephalus or ventricular trapping. Mild crowding at the left ambient and perimesencephalic cisterns. Basilar cisterns otherwise remain patent. Associated petechial hemorrhage as seen by previous MRI without interval frank hemorrhagic transformation. Remainder the brain is stable in appearance. No other evidence for acute intracranial hemorrhage. No new large vessel territory infarct. No mass lesion. No extra-axial fluid collection. Vascular: Intracranial atherosclerosis again noted. No new hyperdense vessel. Skull: Scalp soft tissues and calvarium within normal limits. Sinuses/Orbits: Globes and orbital soft tissues normal. Increased  ethmoidal and maxillary sinus mucosal thickening. A nasogastric tube has been removed. Mastoids remain clear. Other: None. IMPRESSION: 1. Continued interval evolution of large left MCA territory infarct, with decreasing mass effect and edema as compared to most recent CT from 05/24/2017. Left-to-right midline shift is improved now measuring 7 mm (previously 14 mm). 2. Grossly stable petechial hemorrhage as seen by previous MRI. No evidence for malignant hemorrhagic transformation. 3. No new intracranial abnormality. Electronically Signed   By: Rise Mu M.D.   On: 05/26/2017 22:40   Dg Chest Portable 1 View  Result Date: 05/27/2017 CLINICAL DATA:  Respiratory failure EXAM: PORTABLE CHEST 1 VIEW COMPARISON:  05/25/2017; 05/24/2017; 05/23/2017; 05/19/2017 FINDINGS: Grossly unchanged cardiac silhouette and mediastinal contours. Interval removal of enteric tube. Slight retraction of right jugular approach central venous catheter with tip projected over the mid SVC. Overall improved aeration of lungs with persistent bibasilar opacities. There is persistent mild elevation of the right hemidiaphragm. No new focal airspace opacities. No pleural effusion or pneumothorax. No acute osseus abnormalities. IMPRESSION: 1. Improved aeration of lungs without definitive superimposed acute cardiopulmonary disease on this AP portable examination. Further evaluation with a PA and lateral chest radiograph may be obtained as clinically indicated. 2. Interval removal of enteric tube. Electronically Signed   By: Simonne Come M.D.   On: 05/27/2017 08:05    STUDIES:  CT Head 10/5 > Large left MCA nonhemorrhagic infarct with minimal sparing of super ganglionic cortex. Mass effect with effacement of the sulci and partial effacement of the left lateral ventricle. 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 CT Head 10/7> L MCA cytotoxic  edema with 4 mm midline shift   Echo 10/6> EF 45-50%, areas of akinesis, no PFO or mural thrombus noted   MRI/MRA Head 10/10 > Evolving L MCA territory infarct with worsened edema, 12 mm midline shift, Occlusion of L ICA CT head 10/13>>> 1. Continued increased intracranial mass effect from the large left MCA territory infarct. Rightward midline shift now 14 mm (increased from 12 mm on 05/21/2017). 2. Petechial hemorrhage as seen by MRI, but no malignant hemorrhagic transformation  EEG 05/26/2017>> findings indicates bilateral cerebral dysfunction that is non-specific in etiology and can be seen with hypoxic/ischemic injury, toxic/metabolic encephalopathies, or medication effect.  The absence of epileptiform discharges does not rule out a clinical diagnosis of epilepsy.  Clinical correlation is advised.    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 Cocaine abuse. Severe encephalopathy, suspect toxic metabolic plus possible contribution of alcohol withdrawal Hx of depression, anxiety.  PLAN -  Neuro following  Continue 3% saline>> weaning per neuro orders 10/16.. Follow imaging as per neurology plans Continue aspirin, Plavix as able once enteral access or swallow passed. Continue Precedex and wean as possible for lowest effective dose as goal risperdal stopped 10/13 - now limited by lack of enteral access >> For swallow 10/16  PULM: Compromised airway d/t neuro status, extubated 10/9  Protecting airway with cough 10/16 For swallow eval 10/16 Suspect some  degree OSA  CXR 10/16>> Improved aeration of lungs without definitive superimposed acute cardiopulmonary disease    PLAN -  Continue pulmonary hygiene Mobilize as able CXR prn NTS prn Follow up Swallow 10/17 Supplemental O2 to maintain sats > 94% Consider qhs CPAP if agitation improves -would NOT tolerate now   CV: Hypertension. Hx of CAD, HLD. Blood pressure currently at goal PLAN -  Continue Lipitor, Plavix, aspirin as able once enteral access/ swallow pass allows  Tele 12 Lead prn Maintain MAP > 65  ENDOCRINE: DM PLAN -  CBG Q 4 Sliding scale insulin per protocol  RENAL: Hypokalemia. Hyperchloremia mca cva with edema prior, therapeutic hypernatremia PLAN -  Plan for sodium goal 145, avoid free water at this time Replete K PRN  F/u chem  3% saline per neuro ( wean initiated 10/16 with plan to d/c 10/17 @ 9 am) Trend UO Avoid nephrotoxic medications  GI:  Dysphagia d/t CN palsy - c/b agitation.  Pulled out coretrak - too agitated for replacement.  PLAN -  Likely needs PEG but sedation will be difficult -   Continues to refuse TEE . For swallow 10/16. If does not pass would plan for intubation/anesthesia and do PEG at same time. ( Would most likely continue to pull Cortrac) Continue Pepcid  HEME: - No active bleeding - DVT ppx > enoxaparin - PT consult - Trend CBC - Transfuse for HGB < 7  ID: No evidence of active infection T max 99.4 PLAN -  Following off antibiotics Trend CBC and Fever curve Culture as is clinically indicated     Bevelyn Ngo, AGACNP-BC 05/27/2017  10:29 AM Pager: [phone]

## 2017-05-28 ENCOUNTER — Inpatient Hospital Stay (HOSPITAL_COMMUNITY): Payer: Medicaid Other

## 2017-05-28 LAB — CBC WITH DIFFERENTIAL/PLATELET
BASOS PCT: 0 %
Basophils Absolute: 0 10*3/uL (ref 0.0–0.1)
EOS ABS: 0.2 10*3/uL (ref 0.0–0.7)
Eosinophils Relative: 1 %
HCT: 39.6 % (ref 39.0–52.0)
Hemoglobin: 13.5 g/dL (ref 13.0–17.0)
Lymphocytes Relative: 18 %
Lymphs Abs: 2.1 10*3/uL (ref 0.7–4.0)
MCH: 28.5 pg (ref 26.0–34.0)
MCHC: 34.1 g/dL (ref 30.0–36.0)
MCV: 83.7 fL (ref 78.0–100.0)
MONO ABS: 0.7 10*3/uL (ref 0.1–1.0)
Monocytes Relative: 6 %
Neutro Abs: 8.4 10*3/uL — ABNORMAL HIGH (ref 1.7–7.7)
Neutrophils Relative %: 75 %
PLATELETS: 409 10*3/uL — AB (ref 150–400)
RBC: 4.73 MIL/uL (ref 4.22–5.81)
RDW: 13.5 % (ref 11.5–15.5)
WBC: 11.4 10*3/uL — ABNORMAL HIGH (ref 4.0–10.5)

## 2017-05-28 LAB — PROCALCITONIN

## 2017-05-28 LAB — BASIC METABOLIC PANEL
ANION GAP: 10 (ref 5–15)
Anion gap: 11 (ref 5–15)
BUN: 12 mg/dL (ref 6–20)
BUN: 13 mg/dL (ref 6–20)
CALCIUM: 8.3 mg/dL — AB (ref 8.9–10.3)
CALCIUM: 8.3 mg/dL — AB (ref 8.9–10.3)
CO2: 21 mmol/L — ABNORMAL LOW (ref 22–32)
CO2: 20 mmol/L — ABNORMAL LOW (ref 22–32)
CREATININE: 0.7 mg/dL (ref 0.61–1.24)
Chloride: 110 mmol/L (ref 101–111)
Chloride: 104 mmol/L (ref 101–111)
Creatinine, Ser: 0.72 mg/dL (ref 0.61–1.24)
GFR calc Af Amer: >60 mL/min (ref >60)
GFR calc Af Amer: >60 mL/min (ref >60)
GFR calc non Af Amer: >60 mL/min (ref >60)
GLUCOSE: 100 mg/dL — AB (ref 65–99)
GLUCOSE: 116 mg/dL — AB (ref 65–99)
POTASSIUM: 3 mmol/L — AB (ref 3.5–5.1)
Potassium: 2.9 mmol/L — ABNORMAL LOW (ref 3.5–5.1)
SODIUM: 141 mmol/L (ref 135–145)
Sodium: 135 mmol/L (ref 135–145)

## 2017-05-28 LAB — GLUCOSE, CAPILLARY
GLUCOSE-CAPILLARY: 102 mg/dL — AB (ref 65–99)
GLUCOSE-CAPILLARY: 129 mg/dL — AB (ref 65–99)
GLUCOSE-CAPILLARY: 100 mg/dL — AB (ref 65–99)
GLUCOSE-CAPILLARY: 115 mg/dL — AB (ref 65–99)
Glucose-Capillary: 97 mg/dL (ref 65–99)
Glucose-Capillary: 110 mg/dL — ABNORMAL HIGH (ref 65–99)

## 2017-05-28 LAB — MAGNESIUM: MAGNESIUM: 2.1 mg/dL (ref 1.7–2.4)

## 2017-05-28 LAB — SODIUM
SODIUM: 139 mmol/L (ref 135–145)
SODIUM: 142 mmol/L (ref 135–145)

## 2017-05-28 LAB — PHOSPHORUS: PHOSPHORUS: 3.1 mg/dL (ref 2.5–4.6)

## 2017-05-28 MED ORDER — POTASSIUM CHLORIDE 10 MEQ/50ML IV SOLN
10.0000 meq | INTRAVENOUS | Status: AC
  Administered 2017-05-28 (×6): 10 meq via INTRAVENOUS
  Filled 2017-05-28 (×6): qty 50

## 2017-05-28 MED ORDER — POTASSIUM CHLORIDE 20 MEQ/15ML (10%) PO SOLN: 40.0000 meq | ORAL | Status: DC

## 2017-05-28 MED ORDER — CEFAZOLIN SODIUM-DEXTROSE 2-4 GM/100ML-% IV SOLN
2.0000 g | Freq: Once | INTRAVENOUS | Status: AC
  Administered 2017-05-29: 2 g via INTRAVENOUS
  Filled 2017-05-28: qty 100

## 2017-05-28 NOTE — Progress Notes (Signed)
Occupational Therapy Treatment Patient Details Name: Henry David MRN: [id number] DOB: 08/10/1961 Today's Date: 05/28/2017    History of present illness Pt is a 56 y.o. male with history of CAD, Cocaine use, Anxiety, Apical mural thromus, Depression, Essential HTN, PTSD, STEMI, Knee sx, Cardiac cath x3. Pt prsented with aphasia and R sided weakness. CT on 10/5 showed large L MCA infarct. CT on 10/13 showed increased intracranial mass effect with right midline shift now 14 mm.   OT comments  Pt is able to attend to Rt with mod cues today.  He will follow one step commands with mod gestural cues in the context of functional tasks.  He was able to comb hair with min guard assist and wash his face.  He required mod A to apply lotion to bil. Hands  He will initiate movement in Rt UE minimally - unable to use it functionally.  Continue to recommend CIR.   Follow Up Recommendations  CIR;Supervision/Assistance - 24 hour    Equipment Recommendations  None recommended by OT    Recommendations for Other Services Rehab consult    Precautions / Restrictions Precautions Precautions: Fall Precaution Comments: pt in posey and L UE mit and wrist retraint, L LE restraint.        Mobility Bed Mobility               General bed mobility comments: up in chair   Transfers                 General transfer comment: Pt sitting up in chair     Balance Overall balance assessment: Needs assistance Sitting-balance support: Feet supported;Single extremity supported Sitting balance-Leahy Scale: Poor                                     ADL either performed or assessed with clinical judgement   ADL Overall ADL's : Needs assistance/impaired     Grooming: Wash/dry face;Brushing hair;Sitting;Supervision/safety Grooming Details (indicate cue type and reason): Using Lt UE, Pt was able to demonstrates appropriate use of comb.  He initially had difficulty orienting comb  correctly to Rt side of head, but was able to eventually figure it out.  He washed face after cues to initiate task, and applied lotion to hands with mod A                                General ADL Comments: Performed hand over hand assist for washing face and combing hair with Rt UE as he was attempting to initiate movement with Rt UE      Vision   Additional Comments: Pt required mod cues to locate items on Rt, and to locate Rt UE    Perception     Praxis      Cognition Arousal/Alertness: Awake/alert Behavior During Therapy: Flat affect Overall Cognitive Status: Difficult to assess Area of Impairment: Attention;Following commands                   Current Attention Level: Sustained   Following Commands: Follows one step commands inconsistently;Follows one step commands with increased time       General Comments: Pt followed one step commands in the context of function with mod gestural cues.  Unable to further assess cognition due to impaired communication  Exercises     Shoulder Instructions       General Comments      Pertinent Vitals/ Pain       Pain Assessment: Faces Faces Pain Scale: No hurt  Home Living                                          Prior Functioning/Environment              Frequency  Min 3X/week        Progress Toward Goals  OT Goals(current goals can now be found in the care plan section)  Progress towards OT goals: Progressing toward goals     Plan Discharge plan remains appropriate    Co-evaluation                 AM-PAC PT "6 Clicks" Daily Activity     Outcome Measure   Help from another person eating meals?: Total Help from another person taking care of personal grooming?: A Little Help from another person toileting, which includes using toliet, bedpan, or urinal?: Total Help from another person bathing (including washing, rinsing, drying)?: Total Help from  another person to put on and taking off regular upper body clothing?: Total Help from another person to put on and taking off regular lower body clothing?: Total 6 Click Score: 8    End of Session    OT Visit Diagnosis: Unsteadiness on feet (R26.81);Other abnormalities of gait and mobility (R26.89);Muscle weakness (generalized) (M62.81);Hemiplegia and hemiparesis Hemiplegia - Right/Left: Right Hemiplegia - dominant/non-dominant: Dominant Hemiplegia - caused by: Cerebral infarction   Activity Tolerance Patient tolerated treatment well   Patient Left in chair;with call bell/phone within reach;with restraints reapplied   Nurse Communication Mobility status        Time: 1431-1456 OT Time Calculation (min): 25 min  Charges: OT General Charges $OT Visit: 1 Visit OT Treatments $Self Care/Home Management : 23-37 mins  [employer], OTR/L [phone]    Jeani Hawking M 05/28/2017, 6:01 PM

## 2017-05-28 NOTE — Progress Notes (Signed)
[facility] ADULT ICU REPLACEMENT PROTOCOL FOR AM LAB REPLACEMENT ONLY  The patient does apply for the [facility] Adult ICU Electrolyte Replacment Protocol based on the criteria listed below:   1. Is GFR >/= 40 ml/min? Yes.    Patient's GFR today is >60 2. Is urine output >/= 0.5 ml/kg/hr for the last 6 hours? Yes.   Patient's UOP is 1.3 ml/kg/hr 3. Is BUN < 60 mg/dL? Yes.    Patient's BUN today is 12 4. Abnormal electrolyte(s): K2.9 5. Ordered repletion with: Per protocol 6. If a panic level lab has been reported, has the CCM MD in charge been notified? Yes.  .   Physician:  Laural Benes, MD  Melrose Nakayama 05/28/2017 6:49 AM

## 2017-05-28 NOTE — Progress Notes (Signed)
STROKE TEAM PROGRESS NOTE   SUBJECTIVE (INTERVAL HISTORY) No family members present. RN at bedside. He is being seen by speech therapy. His cortrack feeding tube has not been replaced . His serum sodium is 139 now off  3% saline drip.   OBJECTIVE Temp:  [98.5 F (36.9 C)-100.3 F (37.9 C)] 100.3 F (37.9 C) (10/17 1200) Pulse Rate:  [66-107] 98 (10/17 1300) Cardiac Rhythm: Normal sinus rhythm (10/17 0800) Resp:  [8-32] 20 (10/17 1300) BP: (118-171)/(79-111) 138/83 (10/17 1300) SpO2:  [94 %-99 %] 94 % (10/17 1300) Weight:  [201 lb 4.5 oz (91.3 kg)] 201 lb 4.5 oz (91.3 kg) (10/17 0500)  CBC:   Recent Labs Lab 05/27/17 0412 05/28/17 0534  WBC 8.5 11.4*  NEUTROABS 5.7 8.4*  HGB 11.8* 13.5  HCT 35.4* 39.6  MCV 84.3 83.7  PLT 311 409*    Basic Metabolic Panel:   Recent Labs Lab 05/26/17 0314  05/27/17 0412  05/28/17 0534 05/28/17 1245  NA 146*  < > 145  < > 141 139  K 3.1*  --  3.3*  --  2.9*  --   CL 118*  --  118*  --  110  --   CO2 23  --  22  --  21*  --   GLUCOSE 122*  --  111*  --  100*  --   BUN 18  --  12  --  12  --   CREATININE 0.69  --  0.67  --  0.72  --   CALCIUM 8.1*  --  8.0*  --  8.3*  --   MG 2.1  --   --   --  2.1  --   PHOS 2.5  --  3.5  --  3.1  --   < > = values in this interval not displayed.  Lipid Panel:     Component Value Date/Time   CHOL 186 05/17/2017 0500   CHOL 114 12/12/2016 1118   TRIG 532 (H) 05/17/2017 0500   HDL 26 (L) 05/17/2017 0500   HDL 27 (L) 12/12/2016 1118   CHOLHDL 7.2 05/17/2017 0500   VLDL UNABLE TO CALCULATE IF TRIGLYCERIDE OVER 400 mg/dL 94/49/6759 1638   LDLCALC UNABLE TO CALCULATE IF TRIGLYCERIDE OVER 400 mg/dL 46/65/9935 7017   LDLCALC 40 12/12/2016 1118   HgbA1c:  Lab Results  Component Value Date   HGBA1C 6.1 (H) 05/17/2017   Urine Drug Screen:     Component Value Date/Time   LABOPIA NONE DETECTED 05/16/2017 1902   COCAINSCRNUR POSITIVE (A) 05/16/2017 1902   LABBENZ POSITIVE (A) 05/16/2017 1902    AMPHETMU NONE DETECTED 05/16/2017 1902   THCU NONE DETECTED 05/16/2017 1902   LABBARB NONE DETECTED 05/16/2017 1902    Alcohol Level     Component Value Date/Time   ETH <10 05/16/2017 1535    IMAGING I have personally reviewed the radiological images below and agree with the radiology interpretations.  [name] 05/21/2017 1. Large evolving acute ischemic infarct involving essentially the entirety of the left MCA territory, similar to previous exams. Associated petechial hemorrhage without frank hemorrhagic transformation, most notable at the left lentiform nucleus. 2. Associated mass effect with 12 mm of left-to-right midline shift, worsened relative to most recent CT from 05/18/2017. No hydrocephalus or ventricular trapping.  [name] 05/21/2017 1. Occlusion of the left ICA at the level of the terminus with absent perfusion within the left middle cerebral artery. 2. Patent anterior communicating  artery with perfusion of both anterior cerebral arteries, and minimal patchy retrograde flow within the left A1 segment. 3. Widely patent right middle cerebral artery and posterior Circulation.  Ct Head Wo Contrast 05/16/2017 IMPRESSION:  1. Large left MCA nonhemorrhagic infarct with minimal sparing of super ganglionic cortex.  2. Mass effect with effacement of the sulci and partial effacement of the left lateral ventricle.  3. ASPECTS is 10/10   05/18/2017 Unchanged appearance of cytotoxic edema throughout the left MCA territory with unchanged rightward midline shift measuring 4 mm. No hemorrhage. No ventricular entrapment or hydrocephalus.  05/17/2017 Large acute infarct in the distribution of the left middle cerebral artery.  Since the previous study, there is increasing mass effect with increased effacement of the left lateral ventricle.  Mild developing left-to-right midline shift of about 4 mm.  No acute intracranial hemorrhage.    05/24/2017    Rightward midline shift now 14 mm (increased from 12 mm on 05/21/2017). 2. Petechial hemorrhage as seen by MRI, but no malignant hemorrhagic transformation. 3. No new intracranial abnormality. Electronically Signed: By: Odessa Fleming M.D. On: 05/24/2017 17:28   Ct Cerebral Perfusion W Contrast 05/16/2017 IMPRESSION:  Large left MCA territory nonhemorrhagic infarct neck exceeds size criteria for intra-arterial intervention.  Ct Angio Head W Or Wo Contrast Ct Angio Neck W Or Wo Contrast 05/16/2017 IMPRESSION:  1. 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  2. No significant collateral flow to the left MCA territory.  3. Patent anterior communicating artery with retrograde filling of the left A1 segment.  4. Minimal atherosclerotic calcification within the cavernous right internal carotid artery.  5. No other significant atherosclerotic disease within the head or neck.  6. Degenerative changes of the cervical spine are most pronounced at C6-7.   Dg Chest Port 1 View 05/19/2017 Stable hypoinflation.  No pneumonia nor significant pulmonary edema. The support tubes are in reasonable position.  05/16/2017 1. Low lung volumes.  2. Satisfactory positioning of support apparatus as above.   05/23/17 1. Low lung volumes. 2. Mild pulmonary vascular congestion and small effusions may reflect neurogenic edema versus cardiac. 3. Minimal bibasilar airspace disease likely reflects atelectasis.  Dg Chest Port 1 View 05/25/2017 IMPRESSION: Stable support apparatus. Hypoinflation of the lungs with mild bibasilar subsegmental atelectasis.   Transthoracic Echocardiogram 05/17/2017 LVEF 45-50% Akinesis of the mid-apical anteroseptal, apical inferoseptal, and apical myocardium. Hypokinesis of the apical inferior myocardium.      PHYSICAL EXAM Vitals:   05/28/17 0900 05/28/17 1100 05/28/17 1200 05/28/17 1300  BP: (!) 147/95 (!)  167/100 (!) 128/98 138/83  Pulse: 92 (!) 106 98 98  Resp: 15 (!) 31 (!) 24 20  Temp:   100.3 F (37.9 C)   TempSrc:   Axillary   SpO2: 94% 95% 94% 94%  Weight:      Height:        Physical exam: General - awake alert, global aphasic  Mental Status - drowsy but can be easily aroused and  spontaneously  moving left side, unable to answer questions or follow commands. Global aphasia. Questionable follow commands of eye opening and closing.  Cranial Nerves II - XII - II - no blinking to visual threat from right III, IV, VI - Left gaze preference but able to cross midline. VII - Right facial droop XII - tongue midline  Motor Strength - Left sided movements spontaneous at least 4/5 strength, right UE mild withdraw on  pain, RLE 2/5 with pain stimulation   Motor Tone - Flaccid on RUE and RLE, normal LLE and LUE  Reflexes - Positive babinski on R  Sensation, coordination and gait not able to test.   ASSESSMENT/PLAN Mr. BURNELL HURTA is a 56 y.o. male with history of anxiety, depression, cocaine use, history of apical mural thrombus, hypertension, hyperlipidemia and coronary artery disease, presenting with aphasia and right sided weakness. He did not receive IV t-PA due to late presentation.  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 Mild developing left-to-right midline shift of about 4 mm.   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  MRI head - left malignant MCA infarct with midline shift about 10mm  MRA head - left ICA terminus persistent occlusion, no left MCA visualized   2D  Echo - LVEF 45% to 50%  LDL - 26  HgbA1c - 6.1  VTE prophylaxis NPO on TF @ 15  aspirin 81 mg daily and clopidogrel 75 mg daily prior to admission, now on ASA 325mg  and clopidogrel 75mg   Ongoing aggressive stroke risk factor management  Therapy recommendations:  - pending  Disposition:  Pending  Cerebral Edema  CT and MRI showed midline shift up to 10mm  Hypertonic saline - weaned off 05/23/17  However, CT repeat 05/24/17 showed progressive midline shift  3% restarted @ 75 with one dose of mannitol and 23.4%  Na 149->143->141->144  Na Q6h  Hx of apical thrombus  12/2015 TTE showed an apical thrombus on definity evaluation, EF 40-45%  Started on coumadin in 12/2015  Repeat TTE 06/2016 EF 40-45% no evidence of thrombus  Coumadin stopped on 07/2016  Recommend TEE to evaluate apical thrombus given the current possible embolic stroke - scheduled Monday 05/26/17  Delirium  Pt agitated and encephalopathic 05/22/17  Concerning for substance vs. Alcohol withdraw  on precedex, wean off as able  on ativan and risperdal bid with NG  On FA/B1/MVI  CCM on board  Hypertension  Stable, BP at low side  Gradually decrease BP to goal   Long-term BP goal 130-150 due to left ICA occlusion  Hyperlipidemia  Home meds:  Lipitor 80 mg daily - resumed in hospital  LDL 26, goal < 70  Continue statin at discharge  Cocaine abuse  UDS positive for cocaine  Cessation counseling will be provided  Dysphagia   Did not pass swallow  Had cortrak placed  Continue TF @ 15  Other Stroke Risk Factors  Former cigarette smoker - quit  ETOH use, will be advised to drink no more than 1 drink per day.  Obesity, Body mass index is 28.88 kg/m., recommend weight loss, diet and exercise as appropriate   Coronary artery disease s/p stent on DAPT  Other Active Problems  Hypokalemia -> replacing  CXR stable  Plan Speech eval for diet but feel he is unlikely to meet his  nutritional needs even if he passes swallow eval and likely needs PEG tube.  Recommend minimizing sedation and letting patient be awake. D/w Dr Merlene Pulling and [facility] day # 12  This patient is critically  ill due to left malignant MCA, cerebral edema, history of apical mural thrombus and at significant risk of neurological worsening, death form brain herniation, heart failure, recurrent stroke, seizure. This patient's care requires constant monitoring of vital signs, hemodynamics, respiratory and cardiac monitoring, review of multiple databases, neurological assessment, discussion with family, other specialists and medical decision making of high complexity. I spent 30 minutes of neurocritical care time in the care of this patient.  Delia HeadyPramod Sethi, MD Stroke Neurology 05/28/2017 3:29 PM     To contact Stroke Continuity provider, please refer to [email or web address]. After hours, contact General Neurology

## 2017-05-28 NOTE — Consult Note (Signed)
Reason for Consult:PEG placement Referring Physician: P. Jahmar Mckelvy [name] is an 56 y.o. male.  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 He has global aphasia and is unable to swallow safely. His aphasia precludes ROS or ant history contribution by him. Dr. Leonie Man asked [place] to place a PEG. There is no next of kin so we would document medical necessity.  Past Medical History:  Diagnosis Date  . Anxiety   . Apical mural thrombus   . BPH (benign prostatic hyperplasia)   . Depression   . Diverticulitis   . Elevated cholesterol   . Essential hypertension   . Post traumatic stress disorder   . STEMI (ST elevation myocardial infarction) [facility])     Past Surgical History:  Procedure Laterality Date  . CARDIAC CATHETERIZATION N/A 07/09/2015   Procedure: Left Heart Cath and Coronary Angiography;  Surgeon: Troy Sine, MD;  Location: [place] [facility];  Service: Cardiovascular;  Laterality: N/A;  . CARDIAC CATHETERIZATION N/A 07/09/2015   Procedure: Coronary Stent Intervention;  Surgeon: Troy Sine, MD;  Location: [place] [facility];  Service: Cardiovascular;  Laterality: N/A;  . CARDIAC CATHETERIZATION N/A 07/11/2015   Procedure: Coronary Stent Intervention;  Surgeon: Troy Sine, MD;  Location: [place] [facility];  Service: Cardiovascular;  Laterality: N/A;  . KNEE SURGERY      Family History  Problem Relation Age of Onset  . Heart disease Mother   . Heart disease Father     Social History:  reports that he quit smoking about 22 months ago. His smoking use included Cigarettes. He smoked 0.00 packs per day. He has quit using smokeless tobacco. He reports that he drinks about 2.4 oz of alcohol per week . He reports that he does not use drugs.  Allergies:  Allergies  Allergen Reactions  . Coconut Fatty Acids     unknown    Medications: I have reviewed the patient's current medications.  Results for orders placed or performed during the  hospital encounter of 05/16/17 (from the past 48 hour(s))  Glucose, capillary     Status: None   Collection Time: 05/26/17 11:22 AM  Result Value Ref Range   Glucose-Capillary 91 65 - 99 mg/dL   Comment 1 Notify RN    Comment 2 Document in Chart   Sodium     Status: Abnormal   Collection Time: 05/26/17 12:50 PM  Result Value Ref Range   Sodium 146 (H) 135 - 145 mmol/L  Glucose, capillary     Status: None   Collection Time: 05/26/17  3:07 PM  Result Value Ref Range   Glucose-Capillary 73 65 - 99 mg/dL   Comment 1 Notify RN    Comment 2 Document in Chart   Sodium     Status: Abnormal   Collection Time: 05/26/17  6:08 PM  Result Value Ref Range   Sodium 146 (H) 135 - 145 mmol/L  Glucose, capillary     Status: Abnormal   Collection Time: 05/26/17  7:25 PM  Result Value Ref Range   Glucose-Capillary 105 (H) 65 - 99 mg/dL  Glucose, capillary     Status: Abnormal   Collection Time: 05/26/17 11:12 PM  Result Value Ref Range   Glucose-Capillary 100 (H) 65 - 99 mg/dL  Sodium     Status: Abnormal   Collection Time: 05/26/17 11:50 PM  Result Value Ref Range   Sodium 146 (H) 135 - 145  mmol/L  Glucose, capillary     Status: Abnormal   Collection Time: 05/27/17  3:18 AM  Result Value Ref Range   Glucose-Capillary 113 (H) 65 - 99 mg/dL  Phosphorus     Status: None   Collection Time: 05/27/17  4:12 AM  Result Value Ref Range   Phosphorus 3.5 2.5 - 4.6 mg/dL  BMET in AM     Status: Abnormal   Collection Time: 05/27/17  4:12 AM  Result Value Ref Range   Sodium 145 135 - 145 mmol/L   Potassium 3.3 (L) 3.5 - 5.1 mmol/L   Chloride 118 (H) 101 - 111 mmol/L   CO2 22 22 - 32 mmol/L   Glucose, Bld 111 (H) 65 - 99 mg/dL   BUN 12 6 - 20 mg/dL   Creatinine, Ser 0.67 0.61 - 1.24 mg/dL   Calcium 8.0 (L) 8.9 - 10.3 mg/dL   GFR calc non Af Amer >60 >60 mL/min   GFR calc Af Amer >60 >60 mL/min    Comment: (NOTE) The eGFR has been calculated using the CKD EPI equation. This calculation has not  been validated in all clinical situations. eGFR's persistently <60 mL/min signify possible Chronic Kidney Disease.    Anion gap 5 5 - 15  CBC with Differential/Platelet     Status: Abnormal   Collection Time: 05/27/17  4:12 AM  Result Value Ref Range   WBC 8.5 4.0 - 10.5 K/uL   RBC 4.20 (L) 4.22 - 5.81 MIL/uL   Hemoglobin 11.8 (L) 13.0 - 17.0 g/dL   HCT 35.4 (L) 39.0 - 52.0 %   MCV 84.3 78.0 - 100.0 fL   MCH 28.1 26.0 - 34.0 pg   MCHC 33.3 30.0 - 36.0 g/dL   RDW 13.5 11.5 - 15.5 %   Platelets 311 150 - 400 K/uL   Neutrophils Relative % 66 %   Neutro Abs 5.7 1.7 - 7.7 K/uL   Lymphocytes Relative 23 %   Lymphs Abs 2.0 0.7 - 4.0 K/uL   Monocytes Relative 7 %   Monocytes Absolute 0.6 0.1 - 1.0 K/uL   Eosinophils Relative 4 %   Eosinophils Absolute 0.3 0.0 - 0.7 K/uL   Basophils Relative 0 %   Basophils Absolute 0.0 0.0 - 0.1 K/uL  Glucose, capillary     Status: None   Collection Time: 05/27/17  7:34 AM  Result Value Ref Range   Glucose-Capillary 81 65 - 99 mg/dL   Comment 1 Notify RN    Comment 2 Document in Chart   Sodium     Status: None   Collection Time: 05/27/17 11:11 AM  Result Value Ref Range   Sodium 145 135 - 145 mmol/L  Glucose, capillary     Status: Abnormal   Collection Time: 05/27/17 11:54 AM  Result Value Ref Range   Glucose-Capillary 109 (H) 65 - 99 mg/dL   Comment 1 Notify RN    Comment 2 Document in Chart   Glucose, capillary     Status: None   Collection Time: 05/27/17  3:53 PM  Result Value Ref Range   Glucose-Capillary 93 65 - 99 mg/dL   Comment 1 Notify RN    Comment 2 Document in Chart   Sodium     Status: None   Collection Time: 05/27/17  6:15 PM  Result Value Ref Range   Sodium 144 135 - 145 mmol/L  Glucose, capillary     Status: None   Collection Time: 05/27/17  7:32 PM  Result Value Ref Range   Glucose-Capillary 95 65 - 99 mg/dL  Glucose, capillary     Status: Abnormal   Collection Time: 05/27/17 11:04 PM  Result Value Ref Range    Glucose-Capillary 100 (H) 65 - 99 mg/dL  Sodium     Status: None   Collection Time: 05/28/17 12:00 AM  Result Value Ref Range   Sodium 142 135 - 145 mmol/L  Glucose, capillary     Status: Abnormal   Collection Time: 05/28/17  3:02 AM  Result Value Ref Range   Glucose-Capillary 102 (H) 65 - 99 mg/dL  Phosphorus     Status: None   Collection Time: 05/28/17  5:34 AM  Result Value Ref Range   Phosphorus 3.1 2.5 - 4.6 mg/dL  Basic metabolic panel     Status: Abnormal   Collection Time: 05/28/17  5:34 AM  Result Value Ref Range   Sodium 141 135 - 145 mmol/L   Potassium 2.9 (L) 3.5 - 5.1 mmol/L   Chloride 110 101 - 111 mmol/L   CO2 21 (L) 22 - 32 mmol/L   Glucose, Bld 100 (H) 65 - 99 mg/dL   BUN 12 6 - 20 mg/dL   Creatinine, Ser 0.72 0.61 - 1.24 mg/dL   Calcium 8.3 (L) 8.9 - 10.3 mg/dL   GFR calc non Af Amer >60 >60 mL/min   GFR calc Af Amer >60 >60 mL/min    Comment: (NOTE) The eGFR has been calculated using the CKD EPI equation. This calculation has not been validated in all clinical situations. eGFR's persistently <60 mL/min signify possible Chronic Kidney Disease.    Anion gap 10 5 - 15  Magnesium     Status: None   Collection Time: 05/28/17  5:34 AM  Result Value Ref Range   Magnesium 2.1 1.7 - 2.4 mg/dL  CBC with Differential/Platelet     Status: Abnormal   Collection Time: 05/28/17  5:34 AM  Result Value Ref Range   WBC 11.4 (H) 4.0 - 10.5 K/uL   RBC 4.73 4.22 - 5.81 MIL/uL   Hemoglobin 13.5 13.0 - 17.0 g/dL   HCT 39.6 39.0 - 52.0 %   MCV 83.7 78.0 - 100.0 fL   MCH 28.5 26.0 - 34.0 pg   MCHC 34.1 30.0 - 36.0 g/dL   RDW 13.5 11.5 - 15.5 %   Platelets 409 (H) 150 - 400 K/uL   Neutrophils Relative % 75 %   Neutro Abs 8.4 (H) 1.7 - 7.7 K/uL   Lymphocytes Relative 18 %   Lymphs Abs 2.1 0.7 - 4.0 K/uL   Monocytes Relative 6 %   Monocytes Absolute 0.7 0.1 - 1.0 K/uL   Eosinophils Relative 1 %   Eosinophils Absolute 0.2 0.0 - 0.7 K/uL   Basophils Relative 0 %    Basophils Absolute 0.0 0.0 - 0.1 K/uL  Glucose, capillary     Status: None   Collection Time: 05/28/17  7:45 AM  Result Value Ref Range   Glucose-Capillary 97 65 - 99 mg/dL   Comment 1 Notify RN    Comment 2 Document in Chart     Ct Head Wo Contrast  Result Date: 05/26/2017 CLINICAL DATA:  Follow-up examination for stroke. EXAM: CT HEAD WITHOUT CONTRAST TECHNIQUE: Contiguous axial images were obtained from the base of the skull through the vertex without intravenous contrast. COMPARISON:  Prior CT from 05/24/2017. FINDINGS: Brain: Continued interval evolution of large left MCA territory infarct. Improved mass effect as compared to most  recent CT from 05/24/2017 with decreased left-to-right midline shift now measuring 7 mm, previously 14 mm. Continued partial effacement of the left lateral ventricle. No hydrocephalus or ventricular trapping. Mild crowding at the left ambient and perimesencephalic cisterns. Basilar cisterns otherwise remain patent. Associated petechial hemorrhage as seen by previous MRI without interval frank hemorrhagic transformation. Remainder the brain is stable in appearance. No other evidence for acute intracranial hemorrhage. No new large vessel territory infarct. No mass lesion. No extra-axial fluid collection. Vascular: Intracranial atherosclerosis again noted. No new hyperdense vessel. Skull: Scalp soft tissues and calvarium within normal limits. Sinuses/Orbits: Globes and orbital soft tissues normal. Increased ethmoidal and maxillary sinus mucosal thickening. A nasogastric tube has been removed. Mastoids remain clear. Other: None. IMPRESSION: 1. Continued interval evolution of large left MCA territory infarct, with decreasing mass effect and edema as compared to most recent CT from 05/24/2017. Left-to-right midline shift is improved now measuring 7 mm (previously 14 mm). 2. Grossly stable petechial hemorrhage as seen by previous MRI. No evidence for malignant hemorrhagic  transformation. 3. No new intracranial abnormality. Electronically Signed   By: Jeannine Boga M.D.   On: 05/26/2017 22:40   Dg Chest Port 1 View  Result Date: 05/28/2017 CLINICAL DATA:  Respiratory failure EXAM: PORTABLE CHEST 1 VIEW COMPARISON:  05/27/2017 FINDINGS: Right central line remains in place, unchanged. Heart is upper limits normal in size. No confluent airspace opacities or effusions. No acute bony abnormality. IMPRESSION: No active disease. Electronically Signed   By: Rolm Baptise M.D.   On: 05/28/2017 08:06   Dg Chest Portable 1 View  Result Date: 05/27/2017 CLINICAL DATA:  Respiratory failure EXAM: PORTABLE CHEST 1 VIEW COMPARISON:  05/25/2017; 05/24/2017; 05/23/2017; 05/19/2017 FINDINGS: Grossly unchanged cardiac silhouette and mediastinal contours. Interval removal of enteric tube. Slight retraction of right jugular approach central venous catheter with tip projected over the mid SVC. Overall improved aeration of lungs with persistent bibasilar opacities. There is persistent mild elevation of the right hemidiaphragm. No new focal airspace opacities. No pleural effusion or pneumothorax. No acute osseus abnormalities. IMPRESSION: 1. Improved aeration of lungs without definitive superimposed acute cardiopulmonary disease on this AP portable examination. Further evaluation with a PA and lateral chest radiograph may be obtained as clinically indicated. 2. Interval removal of enteric tube. Electronically Signed   By: Sandi Mariscal M.D.   On: 05/27/2017 08:05   Dg Swallowing Func-speech Pathology  Result Date: 05/27/2017 Objective Swallowing Evaluation: Type of Study: MBS-Modified Barium Swallow Study Patient Details Name: Henry David MRN: [id number] Date of Birth: 11/26/1960 Today's Date: 05/27/2017 Time: SLP Start Time (ACUTE ONLY): 1320-SLP Stop Time (ACUTE ONLY): 1345 SLP Time Calculation (min) (ACUTE ONLY): 25 min Past Medical History: Past Medical History: Diagnosis Date .  Anxiety  . Apical mural thrombus  . BPH (benign prostatic hyperplasia)  . Depression  . Diverticulitis  . Elevated cholesterol  . Essential hypertension  . Post traumatic stress disorder  . STEMI (ST elevation myocardial infarction) [facility])  Past Surgical History: Past Surgical History: Procedure Laterality Date . CARDIAC CATHETERIZATION N/A 07/09/2015  Procedure: Left Heart Cath and Coronary Angiography;  Surgeon: Troy Sine, MD;  Location: [place] [facility];  Service: Cardiovascular;  Laterality: N/A; . CARDIAC CATHETERIZATION N/A 07/09/2015  Procedure: Coronary Stent Intervention;  Surgeon: Troy Sine, MD;  Location: [place] [facility];  Service: Cardiovascular;  Laterality: N/A; . CARDIAC CATHETERIZATION N/A 07/11/2015  Procedure: Coronary Stent Intervention;  Surgeon: Troy Sine, MD;  Location: [place]  [facility];  Service: Cardiovascular;  Laterality: N/A; . KNEE SURGERY   HPI: Saharsh Sterling Amprazisis a 56 y.o.malewith history of anxiety, depression, diverticulitis, GERD, cocaine use,history of apical mural thrombus, hypertension, hyperlipidemia and coronary artery diseasepresenting with global aphasia, left gaze preference and right hemiplegia. Hedidnot receive IV t-PA due to late presentation. Intubated 10/5-10/9. Head MRI 10/10 shows large evolving acute ischemic infarct involving essentially the entirety of the left MCA territory, similar to previous exams and associated mass effect with 12 mm of left-to-right midline shift, worsened relative to most recent CT from 05/18/2017.  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 Patient with severe oral holding and severe  delay in swallow initiation with resultant penetration of both pureed solids and honey thick liquids without ability to clear. Suspect component of apraxia in addition to poor sustained attention and awareness impacting ability to consistently initiate a pharyngeal swallow. With max clinician verbal, visual, and tactile cueing, including use of HOH assist for self feeding to increase bolus preparation and awareness, patient able to initiate a timely swallow with intact airway protection with honey thick liquid via tsp and pureed solid for a series of 5-6 boluses with intact pharyngeal strength for bolus clearance. Following this series however, patient with return to severe oral holding requiring manual oral suction/care to clear oral cavity without return ability to initiate a pharyngeal swallow despite cues. Unfortunately, this does not allow for safe or efficient po intake at this time. Prognosis for ability to resume a po diet good with improved mentation and cognitive-linguistic status.  SLP Visit Diagnosis Dysphagia, oropharyngeal phase (R13.12);Aphasia (R47.01) Attention and concentration deficit following -- Frontal lobe and executive function deficit following Cerebral infarction Impact on safety and function Severe aspiration risk   CHL IP TREATMENT RECOMMENDATION 05/27/2017 Treatment Recommendations Therapy as outlined in treatment plan below   Prognosis 05/27/2017 Prognosis for Safe Diet Advancement Fair Barriers to Reach Goals Language deficits;Severity of deficits Barriers/Prognosis Comment -- CHL IP DIET RECOMMENDATION 05/27/2017 SLP Diet Recommendations NPO;Alternative means - temporary Liquid Administration via -- Medication Administration Via alternative means Compensations -- Postural Changes --   CHL IP OTHER RECOMMENDATIONS 05/27/2017 Recommended Consults -- Oral Care Recommendations Oral care QID Other Recommendations --   CHL IP FOLLOW UP RECOMMENDATIONS 05/27/2017 Follow up Recommendations  Inpatient Rehab   CHL IP FREQUENCY AND DURATION 05/27/2017 Speech Therapy Frequency (ACUTE ONLY) min 3x week Treatment Duration 2 weeks      CHL IP ORAL PHASE 05/27/2017 Oral Phase Impaired Oral - Pudding Teaspoon -- Oral - Pudding Cup -- Oral - Honey Teaspoon Weak lingual manipulation;Delayed oral transit;Decreased bolus cohesion;Lingual/palatal residue;Holding of bolus Oral - Honey Cup NT Oral - Nectar Teaspoon -- Oral - Nectar Cup NT Oral - Nectar Straw NT Oral - Thin Teaspoon -- Oral - Thin Cup -- Oral - Thin Straw -- Oral - Puree Weak lingual manipulation;Delayed oral transit;Decreased bolus cohesion;Lingual/palatal residue;Holding of bolus Oral - Mech Soft NT Oral - Regular -- Oral - Multi-Consistency -- Oral - Pill -- Oral Phase - Comment --  CHL IP PHARYNGEAL PHASE 05/27/2017 Pharyngeal Phase Impaired Pharyngeal- Pudding Teaspoon -- Pharyngeal -- Pharyngeal- Pudding Cup -- Pharyngeal -- Pharyngeal- Honey Teaspoon Delayed swallow initiation-vallecula;Delayed swallow initiation-pyriform sinuses;Penetration/Aspiration during swallow Pharyngeal  Material enters airway, remains ABOVE vocal cords and not ejected out Pharyngeal- Honey Cup NT Pharyngeal -- Pharyngeal- Nectar Teaspoon -- Pharyngeal -- Pharyngeal- Nectar Cup NT Pharyngeal -- Pharyngeal- Nectar Straw NT Pharyngeal -- Pharyngeal- Thin Teaspoon -- Pharyngeal -- Pharyngeal- Thin Cup -- Pharyngeal -- Pharyngeal- Thin Straw -- Pharyngeal -- Pharyngeal- Puree Delayed swallow initiation-vallecula;Delayed swallow initiation-pyriform sinuses;Penetration/Aspiration during swallow Pharyngeal Material enters airway, remains ABOVE vocal cords and not ejected out Pharyngeal- Mechanical Soft NT Pharyngeal -- Pharyngeal- Regular -- Pharyngeal -- Pharyngeal- Multi-consistency -- Pharyngeal -- Pharyngeal- Pill -- Pharyngeal -- Pharyngeal Comment --  Gabriel Rainwater MA, CCC-SLP [phone] McCoy Leah Meryl 05/27/2017, 1:59 PM               Review of Systems  Unable to  perform ROS: Patient nonverbal   Blood pressure (!) 147/95, pulse 92, temperature 100.3 F (37.9 C), temperature source Axillary, resp. rate 15, height _0  (1.778 m), weight 91.3 kg (201 lb 4.5 oz), SpO2 94 %. Physical Exam  Constitutional: He appears well-developed and well-nourished.  HENT:  Head: Normocephalic.  Mouth/Throat: Oropharynx is clear and moist.  Eyes: Pupils are equal, round, and reactive to light. Right eye exhibits no discharge. Left eye exhibits no discharge.  Neck: No tracheal deviation present.  Cardiovascular: Normal rate, normal heart sounds and intact distal pulses.   Respiratory: Effort normal and breath sounds normal. No respiratory distress. He has no wheezes. He has no rales.  GI: Soft. He exhibits no distension. There is no tenderness. There is no rebound and no guarding.  Musculoskeletal: He exhibits no deformity.  Neurological:  Awake but with global aphasia, no movement R side  Skin: Skin is warm.    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 Hulen Skains tomorrow. This is a medical necessity. I will D/W Dr. Hulen Skains and schedule.  Sora Vrooman E 05/28/2017, 10:35 AM

## 2017-05-28 NOTE — Progress Notes (Signed)
  Speech Language Pathology Treatment: Dysphagia;Cognitive-Linquistic  Patient Details Name: Henry David MRN: [id number] DOB: 04/16/1961 Today's Date: 05/28/2017 Time: 4825-0037 SLP Time Calculation (min) (ACUTE ONLY): 40 min  Assessment / Plan / Recommendation Clinical Impression  SLP provided intervention to determine readiness for PO and facilitate functional communication and use of symbolic language. Pt fully alert and participatory with restraints removed for 40 minutes. Pt Eager to drink, seeing, cup and reaching initially. Pt self fed 8 oz of honey thick juice and 8 oz of pudding/ice cream, demonstrating problem solving with one handed feeding though moderate assist also provided. With initial 4 oz of liquids pt had some ineffective coughing and concern for aspiration events. Suspect standing oral and pharyngeal secretions and initial delay in swallow initiation impacted airway protection. With further intake automaticity and timing of swallowing improved and no further coughing was observed. Expect that success with swallowing is highly dependent on arousal, automaticity with self feeding and consistent access to palatable foods. Recommend avoiding further sedation and restraints and allowing self feeding with close staff supervision with honey thick and pudding/purees from floor stock. Would expect further improvement with use of swallow mechanism.   Provided opportunities for total communication - use of gestures with LUE - waving, reaching, pointing and calling with functional tasks with improved participation from pt. Pt matched three objects to line drawings with 100% accuracy over two trials after max teaching. Two instances of pt attempt to phonate/articulate also observed. Pt briefly directed attention to left visual field with auditory cues at least 5 x during session. Pt demonstrates significant improvement with lifiting sedation. Will follow for progress.     HPI HPI: Henry David  Amprazisis a 56 y.o.malewith history of anxiety, depression, diverticulitis, GERD, cocaine use,history of apical mural thrombus, hypertension, hyperlipidemia and coronary artery diseasepresenting with global aphasia, left gaze preference and right hemiplegia. Hedidnot receive IV t-PA due to late presentation. Intubated 10/5-10/9. Head MRI 10/10 shows large evolving acute ischemic infarct involving essentially the entirety of the left MCA territory, similar to previous exams and associated mass effect with 12 mm of left-to-right midline shift, worsened relative to most recent CT from 05/18/2017.       SLP Plan  Continue with current plan of care       Recommendations  Diet recommendations: Dysphagia 1 (puree);Honey-thick liquid Liquids provided via: Cup Medication Administration: Crushed with puree                General recommendations: Rehab consult Oral Care Recommendations: Oral care before and after PO Follow up Recommendations: Inpatient Rehab SLP Visit Diagnosis: Dysphagia, oropharyngeal phase (R13.12);Aphasia (R47.01) Frontal lobe and executive function deficit following: Cerebral infarction Plan: Continue with current plan of care       GO               [facility], MA CCC-SLP [phone]  Claudine Mouton 05/28/2017, 1:50 PM

## 2017-05-28 NOTE — Progress Notes (Signed)
Patient ID: Henry David, male   DOB: 1961-06-11, 56 y.o.   MRN: [id number] Noted he passed for D1 honey thick diet. I D/W Dr. Pearlean Brownie and we both agree he will likely not maintain nutrition PO due to his neuro status. Plan PEG tomorrow by Dr. Lindie Spruce at 1030. Violeta Gelinas, MD, MPH, FACS Trauma: [phone] General Surgery: [phone]

## 2017-05-28 NOTE — Progress Notes (Signed)
PULMONARY / CRITICAL CARE MEDICINE   Name: Henry David MRN: [id number] DOB: 03/22/1961    ADMISSION DATE:  05/16/2017  CHIEF COMPLAINT:  Found Down  HISTORY OF PRESENT ILLNESS:   56 yo male found unresponsive and had large MCA infarct with occlusion of Lt carotid.  He was not a candidate for tPA or endovascular intervention. UDS positive for cocaine.  PMHx of CAD.  SUBJECTIVE:  Per RN- 3% NS to be off at 0900 Intermittently destats to 90%, ? Sleep apnea Precedex mainly off since 10/16 around 10 am, briefly on while attempting to place NGT around 1800 No haldol needed overnight Small bore NGT unable to be placed overnight 2/2 agitation Tmax 100.4  VITAL SIGNS: BP 135/87   Pulse 91   Temp 100.2 F (37.9 C) (Axillary)   Resp 10   Ht 5\' 10"  (1.778 m)   Wt 201 lb 4.5 oz (91.3 kg)   SpO2 99%   BMI 28.88 kg/m   VENTILATOR SETTINGS:  N/A  INTAKE / OUTPUT: I/O last 3 completed shifts: In: 2252.3 [I.V.:1952.3; IV Piggyback:300] Out: 3850 [Urine:3850]  PHYSICAL EXAMINATION: General:  Adult male lying in bed, awake in NAD, in soft restraints on left side HEENT: MM pink/ dry, no jvd, pupils 5/= Neuro:  Awake, intermittently follows commands on left, right hemiparesis, globally aphasic  CV: rrr, no m/r/g PULM: shallow/non-labored, diminished on right, clear on left, diminished in bases, some transmitted upper airway sounds, + cough GI: soft, non-tender, bs active  Extremities: warm/dry, no edema  Skin: no rashes  LABS:  BMET  Recent Labs Lab 05/26/17 0314  05/27/17 0412  05/27/17 1815 05/28/17 0000 05/28/17 0534  NA 146*  < > 145  < > 144 142 141  K 3.1*  --  3.3*  --   --   --  2.9*  CL 118*  --  118*  --   --   --  110  CO2 23  --  22  --   --   --  21*  BUN 18  --  12  --   --   --  12  CREATININE 0.69  --  0.67  --   --   --  0.72  GLUCOSE 122*  --  111*  --   --   --  100*  < > = values in this interval not displayed.  Electrolytes  Recent Labs Lab  05/25/17 0315 05/26/17 0314 05/27/17 0412 05/28/17 0534  CALCIUM 8.0* 8.1* 8.0* 8.3*  MG 2.3 2.1  --  2.1  PHOS  --  2.5 3.5 3.1    CBC  Recent Labs Lab 05/26/17 0314 05/27/17 0412 05/28/17 0534  WBC 9.1 8.5 11.4*  HGB 12.1* 11.8* 13.5  HCT 36.6* 35.4* 39.6  PLT 283 311 409*    Coag's No results for input(s): APTT, INR in the last 168 hours.  ABG  Recent Labs Lab 05/23/17 1743 05/23/17 1802  PHART 7.469* 7.419  PCO2ART 28.6* 35.9  PO2ART 113* 27.0*    Liver Enzymes No results for input(s): AST, ALT, ALKPHOS, BILITOT, ALBUMIN in the last 168 hours.  Cardiac Enzymes No results for input(s): TROPONINI, PROBNP in the last 168 hours.  Glucose  Recent Labs Lab 05/27/17 0734 05/27/17 1154 05/27/17 1553 05/27/17 1932 05/27/17 2304 05/28/17 0302  GLUCAP 81 109* 93 95 100* 102*    Imaging Dg Swallowing Func-speech Pathology  Result Date: 05/27/2017 Objective Swallowing Evaluation: Type of Study: MBS-Modified Barium Swallow Study  Patient Details Name: Henry David MRN: [id number] Date of Birth: 1961-02-21 Today's Date: 05/27/2017 Time: SLP Start Time (ACUTE ONLY): 1320-SLP Stop Time (ACUTE ONLY): 1345 SLP Time Calculation (min) (ACUTE ONLY): 25 min Past Medical History: Past Medical History: Diagnosis Date . Anxiety  . Apical mural thrombus  . BPH (benign prostatic hyperplasia)  . Depression  . Diverticulitis  . Elevated cholesterol  . Essential hypertension  . Post traumatic stress disorder  . STEMI (ST elevation myocardial infarction) [facility])  Past Surgical History: Past Surgical History: Procedure Laterality Date . CARDIAC CATHETERIZATION N/A 07/09/2015  Procedure: Left Heart Cath and Coronary Angiography;  Surgeon: Lennette Bihari, MD;  Location: [facility] INVASIVE CV LAB;  Service: Cardiovascular;  Laterality: N/A; . CARDIAC CATHETERIZATION N/A 07/09/2015  Procedure: Coronary Stent Intervention;  Surgeon: Lennette Bihari, MD;  Location: [facility] INVASIVE CV LAB;  Service:  Cardiovascular;  Laterality: N/A; . CARDIAC CATHETERIZATION N/A 07/11/2015  Procedure: Coronary Stent Intervention;  Surgeon: Lennette Bihari, MD;  Location: [facility] INVASIVE CV LAB;  Service: Cardiovascular;  Laterality: N/A; . KNEE SURGERY   HPI: Shelley Pooley Amprazisis a 56 y.o.malewith history of anxiety, depression, diverticulitis, GERD, cocaine use,history of apical mural thrombus, hypertension, hyperlipidemia and coronary artery diseasepresenting with global aphasia, left gaze preference and right hemiplegia. Hedidnot receive IV t-PA due to late presentation. Intubated 10/5-10/9. Head MRI 10/10 shows large evolving acute ischemic infarct involving essentially the entirety of the left MCA territory, similar to previous exams and associated mass effect with 12 mm of left-to-right midline shift, worsened relative to most recent CT from 05/18/2017.  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 Patient with severe oral holding and severe delay in swallow initiation with resultant penetration of both pureed solids and honey thick liquids without ability to clear. Suspect component of apraxia in addition to poor sustained attention and awareness impacting ability to consistently initiate a pharyngeal swallow. With max clinician verbal, visual, and tactile cueing, including use of HOH assist for self feeding to increase bolus preparation and awareness, patient able to initiate a timely swallow with intact airway protection with honey thick liquid via tsp and pureed solid for a series of 5-6 boluses with intact pharyngeal strength for bolus clearance. Following this series however, patient with  return to severe oral holding requiring manual oral suction/care to clear oral cavity without return ability to initiate a pharyngeal swallow despite cues. Unfortunately, this does not allow for safe or efficient po intake at this time. Prognosis for ability to resume a po diet good with improved mentation and cognitive-linguistic status.  SLP Visit Diagnosis Dysphagia, oropharyngeal phase (R13.12);Aphasia (R47.01) Attention and concentration deficit following -- Frontal lobe and executive function deficit following Cerebral infarction Impact on safety and function Severe aspiration risk   CHL IP TREATMENT RECOMMENDATION 05/27/2017 Treatment Recommendations Therapy as outlined in treatment plan below   Prognosis 05/27/2017 Prognosis for Safe Diet Advancement Fair Barriers to Reach Goals Language deficits;Severity of deficits Barriers/Prognosis Comment -- CHL IP DIET RECOMMENDATION 05/27/2017 SLP Diet Recommendations NPO;Alternative means - temporary Liquid Administration via -- Medication Administration Via alternative means Compensations -- Postural Changes --   CHL IP OTHER RECOMMENDATIONS 05/27/2017 Recommended Consults -- Oral Care Recommendations Oral care QID Other Recommendations --  CHL IP FOLLOW UP RECOMMENDATIONS 05/27/2017 Follow up Recommendations Inpatient Rehab   CHL IP FREQUENCY AND DURATION 05/27/2017 Speech Therapy Frequency (ACUTE ONLY) min 3x week Treatment Duration 2 weeks      CHL IP ORAL PHASE 05/27/2017 Oral Phase Impaired Oral - Pudding Teaspoon -- Oral - Pudding Cup -- Oral - Honey Teaspoon Weak lingual manipulation;Delayed oral transit;Decreased bolus cohesion;Lingual/palatal residue;Holding of bolus Oral - Honey Cup NT Oral - Nectar Teaspoon -- Oral - Nectar Cup NT Oral - Nectar Straw NT Oral - Thin Teaspoon -- Oral - Thin Cup -- Oral - Thin Straw -- Oral - Puree Weak lingual manipulation;Delayed oral transit;Decreased bolus cohesion;Lingual/palatal residue;Holding of bolus Oral - Mech  Soft NT Oral - Regular -- Oral - Multi-Consistency -- Oral - Pill -- Oral Phase - Comment --  CHL IP PHARYNGEAL PHASE 05/27/2017 Pharyngeal Phase Impaired Pharyngeal- Pudding Teaspoon -- Pharyngeal -- Pharyngeal- Pudding Cup -- Pharyngeal -- Pharyngeal- Honey Teaspoon Delayed swallow initiation-vallecula;Delayed swallow initiation-pyriform sinuses;Penetration/Aspiration during swallow Pharyngeal Material enters airway, remains ABOVE vocal cords and not ejected out Pharyngeal- Honey Cup NT Pharyngeal -- Pharyngeal- Nectar Teaspoon -- Pharyngeal -- Pharyngeal- Nectar Cup NT Pharyngeal -- Pharyngeal- Nectar Straw NT Pharyngeal -- Pharyngeal- Thin Teaspoon -- Pharyngeal -- Pharyngeal- Thin Cup -- Pharyngeal -- Pharyngeal- Thin Straw -- Pharyngeal -- Pharyngeal- Puree Delayed swallow initiation-vallecula;Delayed swallow initiation-pyriform sinuses;Penetration/Aspiration during swallow Pharyngeal Material enters airway, remains ABOVE vocal cords and not ejected out Pharyngeal- Mechanical Soft NT Pharyngeal -- Pharyngeal- Regular -- Pharyngeal -- Pharyngeal- Multi-consistency -- Pharyngeal -- Pharyngeal- Pill -- Pharyngeal -- Pharyngeal Comment --  Ferdinand Lango MA, CCC-SLP [phone] McCoy Leah Meryl 05/27/2017, 1:59 PM              STUDIES:  CT Head 10/5 > Large left MCA nonhemorrhagic infarct with minimal sparing of super ganglionic cortex. Mass effect with effacement of the sulci and partial effacement of the left lateral ventricle. 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 CT Head 10/7> L MCA cytotoxic edema with 4 mm midline shift   Echo 10/6> EF 45-50%, areas of akinesis, no PFO or mural thrombus noted   MRI/MRA Head 10/10 > Evolving L MCA territory infarct with worsened edema, 12 mm midline shift, Occlusion of L ICA CT head 10/13>>> 1. Continued increased intracranial mass effect from the large left MCA territory  infarct. Rightward midline shift now 14 mm (increased from 12 mm on 05/21/2017). 2. Petechial hemorrhage as seen by MRI, but no malignant hemorrhagic transformation  EEG 05/26/2017>> findings indicates bilateral cerebral dysfunction that is non-specific in etiology and can be seen with hypoxic/ischemic injury, toxic/metabolic encephalopathies, or medication effect.  The absence of epileptiform discharges does not rule out a clinical diagnosis of epilepsy.  Clinical correlation is advised.  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 Cocaine abuse. Severe encephalopathy, suspect toxic metabolic plus possible contribution of alcohol withdrawal Hx of depression, anxiety.  PLAN -  Neuro following  Continue 3% saline>> weaning started10/16 per neuro orders- to be stopped 9am 10/17 Imaging per Neuro  Continue aspirin, Plavix as able once enteral access or swallow passed Seroquel Hs when enteral access obtained Haldol PRN   PULM: Compromised airway d/t neuro status, extubated 10/9  Protecting airway with cough  SLP following for dysphagia, remains NPO Suspect some degree OSA  CXR 10/17>> no obvious inflitrate  PLAN -  Continue pulmonary hygiene Mobilize as able, OOB, PT following CXR prn NTS prn SLP following Supplemental O2 to maintain sats > 94% Consider qhs CPAP if agitation improves -would NOT tolerate now   CV: Hypertension. Hx of CAD, HLD. Blood pressure currently at goal PLAN -  Continue Lipitor, Plavix, aspirin as able once enteral  access/ swallow pass allows  Tele 12 Lead prn Maintain MAP > 65  ENDOCRINE: DM PLAN -  CBG Q 4 Sliding scale insulin per protocol  RENAL: Hypokalemia. Hyperchloremia mca cva with edema prior, therapeutic hypernatremia PLAN -  Sodium goal 150, avoid free water at this time KCL 10 meq x 6 runs 10/17 F/u BMET at 2000 Trend chem/ mag/ phos 3% saline per neuro- to be off 10/17 0900 Trend UO Avoid nephrotoxic medications  GI:  Dysphagia d/t CN palsy - c/b agitation.  Pulled out coretrak -unable to replace w/small bore 2/2 agitation.  Failed SLP 10/16- failed MBS- secondary to oral holding PLAN -  Remains NPO Attempt cortrak placement this am,if fails, will consult with [facility] for peg placement (will likely need long term as patient continues to pull out NGTs) Continue Pepcid  HEME: - No active bleeding - DVT ppx > enoxaparin - PT following - Trend CBC - Transfuse for HGB < 7  ID: No evidence of active infection, CXR 10/17 without obvious inflitrate T max 100.2 Slight leukocytosis 10/17, he is high risk for aspiration PLAN -  Following off antibiotics Trend CBC and Fever curve Culture as is clinically indicated Check PCT Will obtain PIV x 2 and d/c R IJ after 3% completed today   Global:  After 3% is off this am, patient stable and can be transferred to SDU and TRH as of 10/18.  Posey Boyer, AGACNP-BC [facility] Pgr: [phone] or if no answer [phone] 05/28/2017, 7:22 AM

## 2017-05-28 NOTE — Progress Notes (Signed)
Cortrak Tube Team Note:  Consult received to place a Cortrak feeding tube. After much discussion and clarification, it was determined that plan is for PEG placement tomorrow AM and Cortrak is not desired due to this plan.  RD will continue to follow per protocol.    Trenton Gammon, MS, RD, LDN, [facility] Inpatient Clinical Dietitian Pager # [phone] After hours/weekend pager # [phone]

## 2017-05-28 NOTE — Progress Notes (Signed)
Physical Therapy Treatment Patient Details Name: Henry David MRN: [id number] DOB: February 07, 1961 Today's Date: 05/28/2017    History of Present Illness Pt is a 56 y.o. male with history of CAD, Cocaine use, Anxiety, Apical mural thromus, Depression, Essential HTN, PTSD, STEMI, Knee sx, Cardiac cath x3. Pt prsented with aphasia and R sided weakness. CT on 10/5 showed large L MCA infarct. CT on 10/13 showed increased intracranial mass effect with right midline shift now 14 mm.    PT Comments    Pt was able to transfer OOB to chair with two person assist today.  He did well attempting to sit EOB.  He, with structure and more visual and manual cues, was able to use the [place] suction to suction his own mouth out with his left hand.  Pt is making good progress.  PT will continue to follow acutely.    Follow Up Recommendations  CIR     Equipment Recommendations  Wheelchair (measurements PT);Wheelchair cushion (measurements PT);Hospital bed    Recommendations for Other Services   NA     Precautions / Restrictions Precautions Precautions: Fall Precaution Comments: pt in posey and L UE mit and wrist retraint, L LE restraint.     Mobility  Bed Mobility Overal bed mobility: Needs Assistance Bed Mobility: Supine to Sit     Supine to sit: +2 for physical assistance;Max assist     General bed mobility comments: Two person max assist to help pt move bil legs to EOB, assist trunk to sitting and most of all, scoot to EOB using bed pad to faciliate weight shift.   Transfers Overall transfer level: Needs assistance Equipment used: None Transfers: Sit to/from [employer] Sit to Stand: +2 physical assistance;Max assist Stand pivot transfers: +2 physical assistance;Max assist       General transfer comment: Two person max assist to help pt find midline standing , block right knee and take 1-2 pivotal steps around to the chair.  Assist needed for balance and trunk support  over right weak leg.   Ambulation/Gait             General Gait Details: unable at this time.        Modified Rankin (Stroke Patients Only) Modified Rankin (Stroke Patients Only) Pre-Morbid Rankin Score: No symptoms Modified Rankin: Severe disability     Balance Overall balance assessment: Needs assistance Sitting-balance support: Feet supported;Single extremity supported Sitting balance-Leahy Scale: Poor Sitting balance - Comments: needed varying levels of assistance in sitting EOB manual assist and multi modal cues for pt to find bed rail to stabilize his balance with his left hand                                     Cognition Arousal/Alertness: Awake/alert Behavior During Therapy: Flat affect Overall Cognitive Status: Difficult to assess Area of Impairment: Attention;Following commands                   Current Attention Level: Sustained   Following Commands: Follows one step commands inconsistently;Follows one step commands with increased time       General Comments: Difficult to assess as pt with significant communication deficits.  He was able to do some things for me with multimodal cues and often initiation from me.              Pertinent Vitals/Pain Pain Assessment: Faces Faces Pain Scale: No hurt  Prior Function            PT Goals (current goals can now be found in the care plan section) Acute Rehab PT Goals Patient Stated Goal: unable to state, did not verablize Progress towards PT goals: Progressing toward goals    Frequency    Min 4X/week      PT Plan Current plan remains appropriate       AM-PAC PT "6 Clicks" Daily Activity  Outcome Measure  Difficulty turning over in bed (including adjusting bedclothes, sheets and blankets)?: Unable Difficulty moving from lying on back to sitting on the side of the bed? : Unable Difficulty sitting down on and standing up from a chair with arms (e.g., wheelchair,  bedside commode, etc,.)?: Unable Help needed moving to and from a bed to chair (including a wheelchair)?: A Lot Help needed walking in hospital room?: Total Help needed climbing 3-5 steps with a railing? : Total 6 Click Score: 7    End of Session Equipment Utilized During Treatment: Gait belt Activity Tolerance: Patient tolerated treatment well Patient left: in chair;with call bell/phone within reach;with chair alarm set;with restraints reapplied Nurse Communication: Mobility status PT Visit Diagnosis: Difficulty in walking, not elsewhere classified (R26.2);Muscle weakness (generalized) (M62.81);Hemiplegia and hemiparesis Hemiplegia - Right/Left: Right Hemiplegia - dominant/non-dominant: Dominant Hemiplegia - caused by: Cerebral infarction     Time: 1350-1426 PT Time Calculation (min) (ACUTE ONLY): 36 min  Charges:  $Therapeutic Activity: 23-37 mins          Aarushi Hemric B. Latara Micheli, PT, DPT [phone]            05/28/2017, 11:20 PM

## 2017-05-29 ENCOUNTER — Encounter (HOSPITAL_COMMUNITY): Payer: Self-pay | Admitting: Anesthesiology

## 2017-05-29 ENCOUNTER — Inpatient Hospital Stay (HOSPITAL_COMMUNITY): Payer: Medicaid Other | Admitting: Anesthesiology

## 2017-05-29 ENCOUNTER — Encounter (HOSPITAL_COMMUNITY)
Admission: EM | Disposition: A | Discharge status: Skilled Nursing Facility | Payer: Self-pay | Source: Home / Self Care | Attending: Internal Medicine

## 2017-05-29 DIAGNOSIS — F320 Major depressive disorder, single episode, mild: Secondary | ICD-10-CM

## 2017-05-29 DIAGNOSIS — E781 Pure hyperglyceridemia: Secondary | ICD-10-CM

## 2017-05-29 DIAGNOSIS — I5022 Chronic systolic (congestive) heart failure: Secondary | ICD-10-CM

## 2017-05-29 DIAGNOSIS — E118 Type 2 diabetes mellitus with unspecified complications: Secondary | ICD-10-CM

## 2017-05-29 DIAGNOSIS — F419 Anxiety disorder, unspecified: Secondary | ICD-10-CM

## 2017-05-29 DIAGNOSIS — G4733 Obstructive sleep apnea (adult) (pediatric): Secondary | ICD-10-CM

## 2017-05-29 HISTORY — PX: ESOPHAGOGASTRODUODENOSCOPY: SHX5428

## 2017-05-29 HISTORY — PX: PEG PLACEMENT: SHX5437

## 2017-05-29 LAB — MAGNESIUM
Magnesium: 2 mg/dL (ref 1.7–2.4)
Magnesium: 2 mg/dL (ref 1.7–2.4)

## 2017-05-29 LAB — BASIC METABOLIC PANEL
Anion gap: 11 (ref 5–15)
Anion gap: 8 (ref 5–15)
BUN: 13 mg/dL (ref 6–20)
BUN: 16 mg/dL (ref 6–20)
CALCIUM: 8.2 mg/dL — AB (ref 8.9–10.3)
CHLORIDE: 105 mmol/L (ref 101–111)
CO2: 21 mmol/L — ABNORMAL LOW (ref 22–32)
CO2: 21 mmol/L — AB (ref 22–32)
CREATININE: 0.81 mg/dL (ref 0.61–1.24)
Calcium: 8.5 mg/dL — ABNORMAL LOW (ref 8.9–10.3)
Chloride: 104 mmol/L (ref 101–111)
Creatinine, Ser: 0.68 mg/dL (ref 0.61–1.24)
GFR calc Af Amer: >60 mL/min (ref >60)
GFR calc non Af Amer: >60 mL/min (ref >60)
GLUCOSE: 102 mg/dL — AB (ref 65–99)
GLUCOSE: 121 mg/dL — AB (ref 65–99)
POTASSIUM: 3 mmol/L — AB (ref 3.5–5.1)
POTASSIUM: 3.2 mmol/L — AB (ref 3.5–5.1)
Sodium: 133 mmol/L — ABNORMAL LOW (ref 135–145)
Sodium: 137 mmol/L (ref 135–145)

## 2017-05-29 LAB — GLUCOSE, CAPILLARY
GLUCOSE-CAPILLARY: 115 mg/dL — AB (ref 65–99)
GLUCOSE-CAPILLARY: 92 mg/dL (ref 65–99)
GLUCOSE-CAPILLARY: 120 mg/dL — AB (ref 65–99)
GLUCOSE-CAPILLARY: 127 mg/dL — AB (ref 65–99)
GLUCOSE-CAPILLARY: 147 mg/dL — AB (ref 65–99)
Glucose-Capillary: 105 mg/dL — ABNORMAL HIGH (ref 65–99)

## 2017-05-29 LAB — SODIUM
SODIUM: 138 mmol/L (ref 135–145)
SODIUM: 137 mmol/L (ref 135–145)

## 2017-05-29 LAB — PHOSPHORUS: PHOSPHORUS: 3.3 mg/dL (ref 2.5–4.6)

## 2017-05-29 SURGERY — EGD (ESOPHAGOGASTRODUODENOSCOPY)
Anesthesia: Monitor Anesthesia Care

## 2017-05-29 MED ORDER — NIACIN 500 MG PO TABS
500.0000 mg | ORAL_TABLET | Freq: Every day | ORAL | Status: DC
  Administered 2017-05-30 – 2017-06-03 (×5): 500 mg via ORAL
  Filled 2017-05-29 (×5): qty 1

## 2017-05-29 MED ORDER — POTASSIUM CHLORIDE 10 MEQ/100ML IV SOLN
10.0000 meq | INTRAVENOUS | Status: AC
  Administered 2017-05-29 (×6): 10 meq via INTRAVENOUS
  Filled 2017-05-29 (×2): qty 100

## 2017-05-29 MED ORDER — FENTANYL CITRATE (PF) 100 MCG/2ML IJ SOLN
INTRAMUSCULAR | Status: DC | PRN
  Administered 2017-05-29: 50 ug via INTRAVENOUS

## 2017-05-29 MED ORDER — LIDOCAINE 2% (20 MG/ML) 5 ML SYRINGE
INTRAMUSCULAR | Status: DC | PRN
  Administered 2017-05-29: 40 mg via INTRAVENOUS

## 2017-05-29 MED ORDER — JEVITY 1.2 CAL PO LIQD
1000.0000 mL | ORAL | Status: DC
  Administered 2017-05-29: 1000 mL
  Filled 2017-05-29 (×4): qty 1000

## 2017-05-29 MED ORDER — CEFAZOLIN SODIUM-DEXTROSE 2-4 GM/100ML-% IV SOLN
INTRAVENOUS | Status: AC
  Filled 2017-05-29: qty 100

## 2017-05-29 MED ORDER — CHLORHEXIDINE GLUCONATE 0.12 % MT SOLN
OROMUCOSAL | Status: AC
  Administered 2017-05-29: 15 mL
  Filled 2017-05-29: qty 15

## 2017-05-29 MED ORDER — PROPOFOL 10 MG/ML IV BOLUS
INTRAVENOUS | Status: DC | PRN
  Administered 2017-05-29: 50 mg via INTRAVENOUS
  Administered 2017-05-29: 60 mg via INTRAVENOUS
  Administered 2017-05-29: 50 mg via INTRAVENOUS
  Administered 2017-05-29: 40 mg via INTRAVENOUS
  Administered 2017-05-29 (×2): 50 mg via INTRAVENOUS

## 2017-05-29 MED ORDER — LACTATED RINGERS IV SOLN
INTRAVENOUS | Status: DC | PRN
  Administered 2017-05-29: 10:00:00 via INTRAVENOUS

## 2017-05-29 MED ORDER — PANTOPRAZOLE SODIUM 40 MG PO TBEC
40.0000 mg | DELAYED_RELEASE_TABLET | Freq: Every day | ORAL | Status: DC
  Administered 2017-05-29 – 2017-06-03 (×6): 40 mg via ORAL
  Filled 2017-05-29 (×6): qty 1

## 2017-05-29 MED ORDER — GLYCOPYRROLATE 0.2 MG/ML IJ SOLN
INTRAMUSCULAR | Status: DC | PRN
  Administered 2017-05-29: 0.2 mg via INTRAVENOUS

## 2017-05-29 NOTE — Anesthesia Postprocedure Evaluation (Signed)
Anesthesia Post Note  Patient: Henry David  Procedure(s) Performed: ESOPHAGOGASTRODUODENOSCOPY (EGD) (N/A ) PERCUTANEOUS ENDOSCOPIC GASTROSTOMY (PEG) PLACEMENT (N/A )     Patient location during evaluation: Endoscopy Anesthesia Type: MAC Level of consciousness: awake and alert Pain management: pain level controlled Vital Signs Assessment: post-procedure vital signs reviewed and stable Respiratory status: spontaneous breathing, nonlabored ventilation and respiratory function stable Cardiovascular status: stable and blood pressure returned to baseline Postop Assessment: no apparent nausea or vomiting Anesthetic complications: no    Last Vitals:  Vitals:   05/29/17 1120 05/29/17 1200  BP: (!) 114/59   Pulse: (!) 121 98  Resp: 15 17  Temp: 36.9 C 37.1 C  SpO2: 97% 98%    Last Pain:  Vitals:   05/29/17 1200  TempSrc: Axillary  PainSc:                  [name] A.

## 2017-05-29 NOTE — Progress Notes (Signed)
I contacted pt's roommate, Greig Castilla, by phone. He states he lives with Fayrene Fearing in [place] on [address]. States all of pt's family is deceased. Mom died 3 years ago. Greig Castilla is looking for pt's lawyer's name for he handles pt's finances, affairs and property. Greig Castilla is requesting assistance from a SW. I contacted Eileen Stanford, SW, to follow up with Greig Castilla on resources and plans for rehab. We will follow at this time to assess caregiver support for dispo options. [phone]

## 2017-05-29 NOTE — Anesthesia Preprocedure Evaluation (Addendum)
Anesthesia Evaluation  Patient identified by MRN, date of birth, ID band Patient awake    Reviewed: Allergy & Precautions, NPO status , Patient's Chart, lab work & pertinent test results, reviewed documented beta blocker date and time   Airway Mallampati: III  TM Distance: >3 FB Neck ROM: Full    Dental  (+) Poor Dentition   Pulmonary former smoker,    Pulmonary exam normal breath sounds clear to auscultation       Cardiovascular hypertension, Pt. on medications and Pt. on home beta blockers + angina with exertion + CAD, + Past MI and + Cardiac Stents  Normal cardiovascular exam Rhythm:Regular Rate:Normal  Apical mural thrombus DES LAD, RCA Echo: 05/17/2017:45% to 50%.  Regional wall motion abnormalities:  Akinesis of the mid-apical anteroseptal, apical inferoseptal, and apical myocardium. Hypokinesis of the apical inferior myocardium   Neuro/Psych PSYCHIATRIC DISORDERS Anxiety Depression Right hemiparesis Inability to swallow CVA, Residual Symptoms    GI/Hepatic Neg liver ROS, GERD  Medicated and Controlled,  Endo/Other  Hyperlipidemia  Renal/GU negative Renal ROS   ED    Musculoskeletal   Abdominal   Peds  Hematology  (+) anemia , ON Plavix and heparin   Anesthesia Other Findings   Reproductive/Obstetrics                            Anesthesia Physical Anesthesia Plan  ASA: IV  Anesthesia Plan: MAC   Post-op Pain Management:    Induction: Intravenous  PONV Risk Score and Plan: 2 and Ondansetron and Propofol infusion  Airway Management Planned: Natural Airway and Nasal Cannula  Additional Equipment:   Intra-op Plan:   Post-operative Plan:   Informed Consent: I have reviewed the patients History and Physical, chart, labs and discussed the procedure including the risks, benefits and alternatives for the proposed anesthesia with the patient or authorized representative who has  indicated his/her understanding and acceptance.   Dental advisory given  Plan Discussed with: CRNA, Anesthesiologist and Surgeon  Anesthesia Plan Comments:         Anesthesia Quick Evaluation

## 2017-05-29 NOTE — Progress Notes (Signed)
PT Cancellation Note  Patient Details Name: Henry David MRN: [id number] DOB: November 13, 1960   Cancelled Treatment:    Reason Eval/Treat Not Completed: Patient at procedure or test/unavailable.  Pt is at endo.  PT to check back later as time allows.   Thanks,    Rollene Rotunda. [name], PT, DPT [phone]   05/29/2017, 10:14 AM

## 2017-05-29 NOTE — Progress Notes (Signed)
Plan for PEG today.  Marta Lamas. Gae Bon, MD, FACS [phone] Trauma Surgeon

## 2017-05-29 NOTE — Progress Notes (Signed)
STROKE TEAM PROGRESS NOTE   SUBJECTIVE (INTERVAL HISTORY) No family members present. RN at bedside. He is more alert but remains aphasic and not following commands. His serum sodium is 133 now off  3% saline drip.   OBJECTIVE Temp:  [98 F (36.7 C)-100.5 F (38.1 C)] 98.8 F (37.1 C) (10/18 1200) Pulse Rate:  [78-121] 94 (10/18 1300) Cardiac Rhythm: Normal sinus rhythm (10/18 0800) Resp:  [12-25] 17 (10/18 1300) BP: (114-175)/(59-111) 114/59 (10/18 1120) SpO2:  [94 %-100 %] 96 % (10/18 1300) Weight:  [198 lb 3.1 oz (89.9 kg)] 198 lb 3.1 oz (89.9 kg) (10/18 1200)  CBC:   Recent Labs Lab 05/27/17 0412 05/28/17 0534  WBC 8.5 11.4*  NEUTROABS 5.7 8.4*  HGB 11.8* 13.5  HCT 35.4* 39.6  MCV 84.3 83.7  PLT 311 409*    Basic Metabolic Panel:   Recent Labs Lab 05/28/17 0534  05/28/17 2029 05/28/17 2345  NA 141  < > 135 133*  K 2.9*  --  3.0* 3.2*  CL 110  --  104 104  CO2 21*  --  20* 21*  GLUCOSE 100*  --  116* 121*  BUN 12  --  13 13  CREATININE 0.72  --  0.70 0.68  CALCIUM 8.3*  --  8.3* 8.2*  MG 2.1  --   --  2.0  PHOS 3.1  --   --  3.3  < > = values in this interval not displayed.  Lipid Panel:     Component Value Date/Time   CHOL 186 05/17/2017 0500   CHOL 114 12/12/2016 1118   TRIG 532 (H) 05/17/2017 0500   HDL 26 (L) 05/17/2017 0500   HDL 27 (L) 12/12/2016 1118   CHOLHDL 7.2 05/17/2017 0500   VLDL UNABLE TO CALCULATE IF TRIGLYCERIDE OVER 400 mg/dL 09/81/1914 7829   LDLCALC UNABLE TO CALCULATE IF TRIGLYCERIDE OVER 400 mg/dL 56/21/3086 5784   LDLCALC 40 12/12/2016 1118   HgbA1c:  Lab Results  Component Value Date   HGBA1C 6.1 (H) 05/17/2017   Urine Drug Screen:     Component Value Date/Time   LABOPIA NONE DETECTED 05/16/2017 1902   COCAINSCRNUR POSITIVE (A) 05/16/2017 1902   LABBENZ POSITIVE (A) 05/16/2017 1902   AMPHETMU NONE DETECTED 05/16/2017 1902   THCU NONE DETECTED 05/16/2017 1902   LABBARB NONE DETECTED 05/16/2017 1902    Alcohol Level      Component Value Date/Time   ETH <10 05/16/2017 1535    IMAGING I have personally reviewed the radiological images below and agree with the radiology interpretations.  [name] 05/21/2017 1. Large evolving acute ischemic infarct involving essentially the entirety of the left MCA territory, similar to previous exams. Associated petechial hemorrhage without frank hemorrhagic transformation, most notable at the left lentiform nucleus. 2. Associated mass effect with 12 mm of left-to-right midline shift, worsened relative to most recent CT from 05/18/2017. No hydrocephalus or ventricular trapping.  [name] 05/21/2017 1. Occlusion of the left ICA at the level of the terminus with absent perfusion within the left middle cerebral artery. 2. Patent anterior communicating artery with perfusion of both anterior cerebral arteries, and minimal patchy retrograde flow within the left A1 segment. 3. Widely patent right middle cerebral artery and posterior Circulation.  Ct Head Wo Contrast 05/16/2017 IMPRESSION:  1. Large left MCA nonhemorrhagic infarct with minimal sparing of super ganglionic cortex.  2. Mass effect with effacement of the sulci and partial effacement of the left  lateral ventricle.  3. ASPECTS is 10/10   05/18/2017 Unchanged appearance of cytotoxic edema throughout the left MCA territory with unchanged rightward midline shift measuring 4 mm. No hemorrhage. No ventricular entrapment or hydrocephalus.  05/17/2017 Large acute infarct in the distribution of the left middle cerebral artery.  Since the previous study, there is increasing mass effect with increased effacement of the left lateral ventricle.  Mild developing left-to-right midline shift of about 4 mm.  No acute intracranial hemorrhage.    05/24/2017   Rightward midline shift now 14 mm (increased from 12 mm on 05/21/2017). 2. Petechial hemorrhage as seen by MRI, but no malignant hemorrhagic  transformation. 3. No new intracranial abnormality. Electronically Signed: By: Odessa Fleming M.D. On: 05/24/2017 17:28   Ct Cerebral Perfusion W Contrast 05/16/2017 IMPRESSION:  Large left MCA territory nonhemorrhagic infarct neck exceeds size criteria for intra-arterial intervention.  Ct Angio Head W Or Wo Contrast Ct Angio Neck W Or Wo Contrast 05/16/2017 IMPRESSION:  1. 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  2. No significant collateral flow to the left MCA territory.  3. Patent anterior communicating artery with retrograde filling of the left A1 segment.  4. Minimal atherosclerotic calcification within the cavernous right internal carotid artery.  5. No other significant atherosclerotic disease within the head or neck.  6. Degenerative changes of the cervical spine are most pronounced at C6-7.   Dg Chest Port 1 View 05/19/2017 Stable hypoinflation.  No pneumonia nor significant pulmonary edema. The support tubes are in reasonable position.  05/16/2017 1. Low lung volumes.  2. Satisfactory positioning of support apparatus as above.   05/23/17 1. Low lung volumes. 2. Mild pulmonary vascular congestion and small effusions may reflect neurogenic edema versus cardiac. 3. Minimal bibasilar airspace disease likely reflects atelectasis.  Dg Chest Port 1 View 05/25/2017 IMPRESSION: Stable support apparatus. Hypoinflation of the lungs with mild bibasilar subsegmental atelectasis.   Transthoracic Echocardiogram 05/17/2017 LVEF 45-50% Akinesis of the mid-apical anteroseptal, apical inferoseptal, and apical myocardium. Hypokinesis of the apical inferior myocardium.      PHYSICAL EXAM Vitals:   05/29/17 0959 05/29/17 1120 05/29/17 1200 05/29/17 1300  BP: (!) 148/83 (!) 114/59    Pulse: 93 (!) 121 98 94  Resp: 16 15 17 17   Temp: 99 F (37.2 C) 98.4 F (36.9 C) 98.8 F (37.1 C)   TempSrc: Axillary Axillary  Axillary   SpO2: 95% 97% 98% 96%  Weight:   198 lb 3.1 oz (89.9 kg)   Height:        Physical exam: General - awake alert, global aphasic  Mental Status - awake and  spontaneously  moving left side, unable to answer questions or follow commands. Global aphasia. Questionable follow commands of eye opening and closing.  Cranial Nerves II - XII - II - no blinking to visual threat from right III, IV, VI - Left gaze preference but able to cross midline. VII - Right facial droop XII - tongue midline  Motor Strength - Left sided movements spontaneous at least 4/5 strength, right UE mild withdraw on pain, RLE 2/5 with pain stimulation   Motor Tone - Flaccid on RUE and RLE, normal LLE and LUE  Reflexes - Positive babinski on R  Sensation, coordination and gait not able to test.   ASSESSMENT/PLAN Henry David is a 56 y.o. male with history of anxiety, depression, cocaine use, history of apical mural thrombus, hypertension,  hyperlipidemia and coronary artery disease, presenting with aphasia and right sided weakness. He did not receive IV t-PA due to late presentation.  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 Mild developing left-to-right midline shift of about 4 mm.   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  MRI head - left malignant MCA infarct with midline shift about 10mm  MRA head - left ICA terminus persistent occlusion, no left MCA visualized   2D Echo - LVEF 45% to 50%  LDL - 26  HgbA1c - 6.1  VTE prophylaxis NPO on TF @ 15  aspirin 81 mg daily and clopidogrel 75  mg daily prior to admission, now on ASA 325mg  and clopidogrel 75mg   Ongoing aggressive stroke risk factor management  Therapy recommendations:  - pending  Disposition:  Pending  Cerebral Edema  CT and MRI showed midline shift up to 10mm  Hypertonic saline - weaned off 05/23/17  However, CT repeat 05/24/17 showed progressive midline shift  3% restarted @ 75 with one dose of mannitol and 23.4%  Na 149->143->141->144  Na Q6h  Hx of apical thrombus  12/2015 TTE showed an apical thrombus on definity evaluation, EF 40-45%  Started on coumadin in 12/2015  Repeat TTE 06/2016 EF 40-45% no evidence of thrombus  Coumadin stopped on 07/2016  Delirium  Pt agitated and encephalopathic 05/22/17  Concerning for substance vs. Alcohol withdraw  on precedex, wean off as able  on ativan and risperdal bid with NG  On FA/B1/MVI  CCM on board  Hypertension  Stable, BP at low side  Gradually decrease BP to goal   Long-term BP goal 130-150 due to left ICA occlusion  Hyperlipidemia  Home meds:  Lipitor 80 mg daily - resumed in hospital  LDL 26, goal < 70  Continue statin at discharge  Cocaine abuse  UDS positive for cocaine  Cessation counseling will be provided  Dysphagia   Did not pass swallow  Had cortrak placed  Continue TF @ 15  Other Stroke Risk Factors  Former cigarette smoker - quit  ETOH use, will be advised to drink no more than 1 drink per day.  Obesity, Body mass index is 28.44 kg/m., recommend weight loss, diet and exercise as appropriate   Coronary artery disease s/p stent on DAPT  Other Active Problems  Hypokalemia -> replacing  CXR stable  Plan PEG  today by trauma team. Encourage intake by mouth   when patient is awake and able to swallow but I feel is unlikely to maintain his nutritional needs by oral route by himself and PEG tube feeding is likely going to supplement his nutrition an denhance his recovery D/w Dr Merlene PullingHammonds [facility]  This patient is critically ill due to left malignant MCA, cerebral edema, history of apical mural thrombus and at significant risk of neurological worsening, death form brain herniation, heart failure, recurrent stroke, seizure. This patient's care requires constant monitoring of vital signs, hemodynamics, respiratory and cardiac monitoring, review of multiple databases, neurological assessment, discussion with family, other specialists and medical decision making of high complexity. I  spent 30 minutes of neurocritical care time in the care of this patient.  Delia Heady, MD Stroke Neurology 05/29/2017 2:12 PM     To contact Stroke Continuity provider, please refer to [email or web address]. After hours, contact General Neurology

## 2017-05-29 NOTE — Progress Notes (Signed)
Patient transferred from 4N to [id number]. Patient oriented to room and call light. Will continue to monitor patient.

## 2017-05-29 NOTE — Anesthesia Procedure Notes (Signed)
Procedure Name: MAC Date/Time: 05/29/2017 10:50 AM Performed by: Orlie Dakin Pre-anesthesia Checklist: Patient identified, Emergency Drugs available, Suction available, Patient being monitored and Timeout performed Patient Re-evaluated:Patient Re-evaluated prior to induction Oxygen Delivery Method: Nasal cannula Preoxygenation: Pre-oxygenation with 100% oxygen

## 2017-05-29 NOTE — Transfer of Care (Signed)
Immediate Anesthesia Transfer of Care Note  Patient: Henry David  Procedure(s) Performed: ESOPHAGOGASTRODUODENOSCOPY (EGD) (N/A ) PERCUTANEOUS ENDOSCOPIC GASTROSTOMY (PEG) PLACEMENT (N/A )  Patient Location: Endoscopy Unit  Anesthesia Type:MAC  Level of Consciousness: sedated and drowsy  Airway & Oxygen Therapy: Patient Spontanous Breathing and Patient connected to nasal cannula oxygen  Post-op Assessment: Report given to RN and Post -op Vital signs reviewed and stable  Post vital signs: Reviewed and stable  Last Vitals:  Vitals:   05/29/17 0900 05/29/17 0959  BP: (!) 141/82 (!) 148/83  Pulse: 89 93  Resp: 14 16  Temp:  37.2 C  SpO2: 95% 95%    Last Pain:  Vitals:   05/29/17 0959  TempSrc: Axillary  PainSc: 0-No pain         Complications: No apparent anesthesia complications

## 2017-05-29 NOTE — Op Note (Signed)
[facility] Patient Name: Henry CleverlyJames Raymond Procedure Date : 05/29/2017 MRN: [id number] Attending MD: Kathrin RuddyJames Isabele Lollar III, MD Date of Birth: 06/04/1961 CSN: [id number] Age: [age] Admit Type: Inpatient Procedure:                Upper GI endoscopy Indications:              Place PEG because patient is unable to eat, Place                            PEG due to dysphagia, Place PEG due to aspiration                            risk, Place PEG due to neurological disorder                            causing impaired swallowing Providers:                Kathrin RuddyJames Aryahna Spagna III, MD, Dwain SarnaPatricia Ford, RN, Arlee Muslimhris                            Chandler Tech., Technician Referring MD:              Medicines:                Propofol per Anesthesia Complications:            No immediate complications. Estimated Blood Loss:      Procedure:                Pre-Anesthesia Assessment:                           - Prior to the procedure, a History and Physical                            was performed, and patient medications and                            allergies were reviewed. The patient is unable to                            give consent secondary to the patient's altered                            mental status. The risks and benefits of the                            procedure and the sedation options and risks were                            discussed with the patient's Medical Necessity. All                            questions were answered and informed consent was  obtained. Patient identification and proposed                            procedure were verified by the physician and the                            nurse Patient's room. Mental Status Examination:                            obtunded. Airway Examination: normal oropharyngeal                            airway and neck mobility. Respiratory Examination:                            clear to auscultation. CV Examination:  normal.                            Prophylactic Antibiotics: The patient does not                            require prophylactic antibiotics. Prior                            Anticoagulants: The patient has taken no previous                            anticoagulant or antiplatelet agents. ASA Grade                            Assessment: IV - A patient with severe systemic                            disease that is a constant threat to life. After                            reviewing the risks and benefits, the patient was                            deemed in satisfactory condition to undergo the                            procedure. The anesthesia plan was to use deep                            sedation / analgesia. Immediately prior to                            administration of medications, the patient was                            re-assessed for adequacy to receive sedatives. The  heart rate, respiratory rate, oxygen saturations,                            blood pressure, adequacy of pulmonary ventilation,                            and response to care were monitored throughout the                            procedure. The physical status of the patient was                            re-assessed after the procedure.                           After obtaining informed consent, the endoscope was                            passed under direct vision. Throughout the                            procedure, the patient's blood pressure, pulse, and                            oxygen saturations were monitored continuously. The                            [id number] ([id number]) scope was introduced through the                            mouth, and advanced to the second part of duodenum.                            The upper GI endoscopy was accomplished without                            difficulty. The patient tolerated the procedure                            well. Scope  In: Scope Out: Findings:      The esophagus was normal.      The examined duodenum was normal.      The entire examined stomach was normal. The patient was placed in the       supine position for PEG placement. The stomach was insufflated to appose       gastric and abdominal walls. A site was located in the body of the       stomach with excellent manual external pressure for placement. The       abdominal wall was marked and prepped in a sterile manner. The area was       anesthetized with 5 mL of 1% lidocaine. The trocar needle was introduced       through the abdominal wall and into the stomach under direct endoscopic       view. A snare was introduced through the endoscope and  opened in the       gastric lumen. The guide wire was passed through the trocar and into the       open snare. The snare was closed around the guide wire. The endoscope       and snare were removed, pulling the wire out through the mouth. A skin       incision was made at the site of needle insertion. The externally       removable 24 Fr EndoVive Safety gastrostomy tube was lubricated. The       G-tube was tied to the guide wire and pulled through the mouth and into       the stomach. The trocar needle was removed, and the gastrostomy tube was       pulled out from the stomach through the skin. The external bumper was       attached to the gastrostomy tube, and the tube was cut to remove the       guide wire. The final position of the gastrostomy tube was confirmed by       relook endoscopy, and skin marking noted to be 4 cm at the external       bumper. The final tension and compression of the abdominal wall by the       PEG tube and external bumper were checked and revealed that the bumper       was moderately tight and mildly deforming the skin. The feeding tube was       capped, and the tube site cleaned and dressed. Impression:               - Normal esophagus.                           - Normal examined  duodenum.                           - Normal stomach.                           - An externally removable PEG placement was                            successfully completed.                           - No specimens collected. Recommendation:           - Please follow the post-PEG recommendations                            including: use PEG today after checked by physician                            and start using PEG today. Procedure Code(s):        --- Professional ---                           [phone], Esophagogastroduodenoscopy, flexible,                            transoral; with directed placement of percutaneous  gastrostomy tube Diagnosis Code(s):        --- Professional ---                           R63.3, Feeding difficulties                           Z43.1, Encounter for attention to gastrostomy                           R13.10, Dysphagia, unspecified CPT copyright 2016 [employer]. All rights reserved. The codes documented in this report are preliminary and upon coder review may  be revised to meet current compliance requirements. Kathrin Ruddy, MD Jimmye Norman III, MD 05/29/2017 6:07:00 PM This report has been signed electronically. Number of Addenda: 0

## 2017-05-29 NOTE — Progress Notes (Signed)
PROGRESS NOTE    Henry David  JJH:[id number] DOB: 1960/11/14 DOA: 05/16/2017 PCP: Arnoldo Morale, MD   Brief Narrative:  56 year old WM PMHx Anxiety; Depression,PTSD, Apical mural thrombus; STEMI,HTN,CAD, HLD, BPH (benign prostatic hyperplasia);  Diverticulitis;   Found down earlier today. He was last seen normal at 2 AM and it is 1816 at time of this dictation. CT scan of the head is already showing a large area of MCA territory infarction. 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 He is unable to provide any history and on arrival in the intensive care unit his respirations were intermittently sonorous and I have intubated him for airway protection.    Subjective: 10/18 MAXIMUM TEMPERATURE last 24 hours 31C, negative CP, negative SOB, negative abdominal pain. Able to shake his head yes and no questions, follows commands.    Assessment & Plan:   Active Problems:   Acute ischemic stroke (HCC)   Cerebral edema (HCC)   Brain herniation (HCC)   Encounter for central line care   Encounter for orogastric (OG) tube placement   Ventilator dependent ([place])   Acute respiratory failure (HCC)   On mechanically assisted ventilation (HCC)   Cocaine abuse (HCC)   Dysphagia, post-stroke   Right hemiparesis (HCC)   Benign essential HTN   Coronary artery disease involving native coronary artery of native heart without angina pectoris   Agitation   Hypokalemia   Hypernatremia   Acute blood loss anemia  Acute CVA -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 -secondary to Cocaine abuse,EtOH abuse and Essential HTN. -Severe encephalopathy,which appears to have cleared as patient is able to follow commands and nod yes and no to questions. -stroke team consulted -PT/OT: Recommended CIR  Depression/ Anxiety.  Chronic Systolic CHF -Strict in and out -Daily weight -Transfusion hemoglobin< 8  Essential  HTN -Controlled without medication.  CAD  OSA? -Patient's cognition appears to be improving on 10/19 ill attempt to initiate CPAP use    DM Type 2 controlled with complications -81/8 Hemoglobin A1c= 6.1 -  Hypertriglyceridemia/HLD -Lipitor 80 mg daily -Niacin 500 mg daily  Hyponatremia -with treated with 3% saline. Resolved  Hypokalemia. -potassium goal> 4 -Potassium IV 60 mEq   Dysphagia    DVT prophylaxis: Lovenox Code Status: Full Family Communication: none Disposition Plan: CIR?   Consultants:  Stroke team [facility] M    Procedures/Significant Events:  10/5 - Presents to ED with large Left MCA CVA  CT Head 10/5 > Large left MCA nonhemorrhagic infarct with minimal sparing of super ganglionic cortex. Mass effect with effacement of the sulci and partial effacement of the left lateral ventricle. 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 10/6 - 3% NS 10/6 Echocardiogram > LVEF 45-50%, Akinesis of the mid-apical anteroseptal, apical  inferoseptal, and apical myocardium. Hypokinesis of the apical inferior myocardium. - no PFO or mural thrombus   CT Head 10/7> L MCA cytotoxic edema with 4 mm midline shift  10/9 Extubated  10/10 enceph bad, precedex   MRI/MRA Head 10/10 > Evolving L MCA territory infarct with worsened edema, 12 mm midline shift, Occlusion of L ICA CT head 10/13>>> 1. Continued increased intracranial mass effect from the large left MCA territory infarct. Rightward midline shift now 14 mm (increased from 12 mm on 05/21/2017). 2. Petechial hemorrhage as seen by MRI, but no malignant  hemorrhagic transformation   EEG 05/26/2017>> findings indicates bilateral cerebral dysfunction that is non-specific in etiology and can be seen with hypoxic/ischemic injury, toxic/metabolic encephalopathies, or medication effect.  The absence of epileptiform discharges does not rule out a  clinical diagnosis of epilepsy.  Clinical correlation is advised. 10/16- Slow wean of 3% Saline per neuro started 9 am ( D/C 10/17 at 9 am), failed SLP 10/18 PEG tube placement    I have personally reviewed and interpreted all radiology studies and my findings are as above.  VENTILATOR SETTINGS: none   Cultures MRSA PCR 10/5 > Negative    Antimicrobials: Anti-infectives    Start     Stop   05/29/17 1100  ceFAZolin (ANCEF) IVPB 2g/100 mL premix     05/29/17 1103       Devices    LINES / TUBES:  ETT 10/5 >> 10/9 Right Subclavian CVC 10/5 >> 10/12 R IJ CVC 10/13 >>      Continuous Infusions: .  ceFAZolin (ANCEF) IV    . dexmedetomidine (PRECEDEX) IV infusion Stopped (05/27/17 1820)  . feeding supplement (OSMOLITE 1.2 CAL)    . sodium chloride (hypertonic) Stopped (05/28/17 0900)     Objective: Vitals:   05/29/17 0500 05/29/17 0503 05/29/17 0600 05/29/17 0700  BP: (!) 139/111 (!) 160/91 (!) 153/89 138/84  Pulse: 100 (!) 109 (!) 101 90  Resp: 15 (!) _0 Temp:      TempSrc:      SpO2: 96% 96% 96% 99%  Weight:      Height:        Intake/Output Summary (Last 24 hours) at 05/29/17 0818 Last data filed at 05/28/17 1700  Gross per 24 hour  Intake            168.7 ml  Output              500 ml  Net           -331.3 ml   Filed Weights   05/26/17 0324 05/27/17 0500 05/28/17 0500  Weight: 210 lb 15.7 oz (95.7 kg) 205 lb 14.6 oz (93.4 kg) 201 lb 4.5 oz (91.3 kg)    Examination:  General: Alert, follows commands, No acute respiratory distress Neck:  Negative scars, masses, torticollis, lymphadenopathy, JVD, right IJ removed area covered and clean negative sign of infection. Lungs: Clear to auscultation bilaterally without wheezes or crackles Cardiovascular: Regular rate and rhythm without murmur gallop or rub normal S1 and S2 Abdomen: negative abdominal pain, nondistended, positive soft, bowel sounds, no rebound, no ascites, no appreciable mass, PEG  tube present Extremities: No significant cyanosis, clubbing, or edema bilateral lower extremities Skin: Negative rashes, lesions, ulcers Psychiatric:  Unable to evaluate secondary to CVA  Central nervous system:  Cranial nerves II through XII intact, tongue/uvula midline, muscle strength LUE/L LE 5/5. Right hemiparesis. Nonverbal, negative receptive aphasia.  .     Data Reviewed: Care during the described time interval was provided by me .  I have reviewed this patient's available data, including medical history, events of note, physical examination, and all test results as part of my evaluation.   CBC:  Recent Labs Lab 05/24/17 0232 05/25/17 0315 05/26/17 0314 05/27/17 0412 05/28/17 0534  WBC 10.3 8.7 9.1 8.5 11.4*  NEUTROABS  --   --   --  5.7 8.4*  HGB 14.3 12.3* 12.1* 11.8* 13.5  HCT 41.7 36.3* 36.6* 35.4* 39.6  MCV 83.1 83.8 84.3 84.3 83.7  PLT 299 264  283 311 937*   Basic Metabolic Panel:  Recent Labs Lab 05/25/17 0315  05/26/17 0314  05/27/17 0412  05/28/17 0000 05/28/17 0534 05/28/17 1245 05/28/17 2029 05/28/17 2345  NA 144  < > 146*  < > 145  < > 142 141 139 135 133*  K 3.2*  --  3.1*  --  3.3*  --   --  2.9*  --  3.0* 3.2*  CL 118*  --  118*  --  118*  --   --  110  --  104 104  CO2 22  --  23  --  22  --   --  21*  --  20* 21*  GLUCOSE 107*  --  122*  --  111*  --   --  100*  --  116* 121*  BUN 18  --  18  --  12  --   --  12  --  13 13  CREATININE 0.67  --  0.69  --  0.67  --   --  0.72  --  0.70 0.68  CALCIUM 8.0*  --  8.1*  --  8.0*  --   --  8.3*  --  8.3* 8.2*  MG 2.3  --  2.1  --   --   --   --  2.1  --   --  2.0  PHOS  --   --  2.5  --  3.5  --   --  3.1  --   --  3.3  < > = values in this interval not displayed. GFR: Estimated Creatinine Clearance: 114.3 mL/min (by C-G formula based on SCr of 0.68 mg/dL). Liver Function Tests: No results for input(s): AST, ALT, ALKPHOS, BILITOT, PROT, ALBUMIN in the last 168 hours. No results for input(s):  LIPASE, AMYLASE in the last 168 hours. No results for input(s): AMMONIA in the last 168 hours. Coagulation Profile: No results for input(s): INR, PROTIME in the last 168 hours. Cardiac Enzymes: No results for input(s): CKTOTAL, CKMB, CKMBINDEX, TROPONINI in the last 168 hours. BNP (last 3 results) No results for input(s): PROBNP in the last 8760 hours. HbA1C: No results for input(s): HGBA1C in the last 72 hours. CBG:  Recent Labs Lab 05/28/17 1516 05/28/17 1923 05/28/17 2301 05/29/17 0309 05/29/17 0732  GLUCAP 100* 115* 110* 115* 105*   Lipid Profile: No results for input(s): CHOL, HDL, LDLCALC, TRIG, CHOLHDL, LDLDIRECT in the last 72 hours. Thyroid Function Tests: No results for input(s): TSH, T4TOTAL, FREET4, T3FREE, THYROIDAB in the last 72 hours. Anemia Panel: No results for input(s): VITAMINB12, FOLATE, FERRITIN, TIBC, IRON, RETICCTPCT in the last 72 hours. Urine analysis:    Component Value Date/Time   COLORURINE YELLOW 05/20/2017 0948   APPEARANCEUR CLEAR 05/20/2017 0948   LABSPEC 1.021 05/20/2017 0948   PHURINE 7.0 05/20/2017 0948   GLUCOSEU NEGATIVE 05/20/2017 0948   GLUCOSEU NEGATIVE 06/20/2008 1444   HGBUR NEGATIVE 05/20/2017 0948   BILIRUBINUR NEGATIVE 05/20/2017 0948   KETONESUR NEGATIVE 05/20/2017 0948   PROTEINUR NEGATIVE 05/20/2017 0948   UROBILINOGEN 0.2 mg/dL 06/20/2008 1444   NITRITE NEGATIVE 05/20/2017 0948   LEUKOCYTESUR NEGATIVE 05/20/2017 0948   Sepsis Labs: _0 (procalcitonin:4,lacticidven:4)  )No results found for this or any previous visit (from the past 240 hour(s)).       Radiology Studies: Dg Chest Port 1 View  Result Date: 05/28/2017 CLINICAL DATA:  Respiratory failure EXAM: PORTABLE CHEST 1 VIEW COMPARISON:  05/27/2017 FINDINGS: Right central line remains  in place, unchanged. Heart is upper limits normal in size. No confluent airspace opacities or effusions. No acute bony abnormality. IMPRESSION: No active disease.  Electronically Signed   By: Rolm Baptise M.D.   On: 05/28/2017 08:06   Dg Swallowing Func-speech Pathology  Result Date: 05/27/2017 Objective Swallowing Evaluation: Type of Study: MBS-Modified Barium Swallow Study Patient Details Name: DAYLYN AZBILL MRN: [id number] Date of Birth: 1960/12/17 Today's Date: 05/27/2017 Time: SLP Start Time (ACUTE ONLY): 1320-SLP Stop Time (ACUTE ONLY): 1345 SLP Time Calculation (min) (ACUTE ONLY): 25 min Past Medical History: Past Medical History: Diagnosis Date . Anxiety  . Apical mural thrombus  . BPH (benign prostatic hyperplasia)  . Depression  . Diverticulitis  . Elevated cholesterol  . Essential hypertension  . Post traumatic stress disorder  . STEMI (ST elevation myocardial infarction) [facility])  Past Surgical History: Past Surgical History: Procedure Laterality Date . CARDIAC CATHETERIZATION N/A 07/09/2015  Procedure: Left Heart Cath and Coronary Angiography;  Surgeon: Troy Sine, MD;  Location: [place] [facility];  Service: Cardiovascular;  Laterality: N/A; . CARDIAC CATHETERIZATION N/A 07/09/2015  Procedure: Coronary Stent Intervention;  Surgeon: Troy Sine, MD;  Location: [place] [facility];  Service: Cardiovascular;  Laterality: N/A; . CARDIAC CATHETERIZATION N/A 07/11/2015  Procedure: Coronary Stent Intervention;  Surgeon: Troy Sine, MD;  Location: [place] [facility];  Service: Cardiovascular;  Laterality: N/A; . KNEE SURGERY   HPI: Henry David a 56 y.o.malewith history of anxiety, depression, diverticulitis, GERD, cocaine use,history of apical mural thrombus, hypertension, hyperlipidemia and coronary artery diseasepresenting with global aphasia, left gaze preference and right hemiplegia. Hedidnot receive IV t-PA due to late presentation. Intubated 10/5-10/9. Head MRI 10/10 shows large evolving acute ischemic infarct involving essentially the entirety of the left MCA territory, similar to previous exams and associated mass effect with 12 mm of  left-to-right midline shift, worsened relative to most recent CT from 05/18/2017.  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 Patient with severe oral holding and severe delay in swallow initiation with resultant penetration of both pureed solids and honey thick liquids without ability to clear. Suspect component of apraxia in addition to poor sustained attention and awareness impacting ability to consistently initiate a pharyngeal swallow. With max clinician verbal, visual, and tactile cueing, including use of HOH assist for self feeding to increase bolus preparation and awareness, patient able to initiate a timely swallow with intact airway protection with honey thick liquid via tsp and pureed solid for a series of 5-6 boluses with intact pharyngeal strength for bolus clearance. Following this series however, patient with return to severe oral holding requiring manual oral suction/care to clear oral cavity without return ability to initiate a pharyngeal swallow despite cues. Unfortunately, this does not allow for safe or efficient po intake at this time. Prognosis for ability to resume a po diet good with improved mentation and cognitive-linguistic status.  SLP Visit Diagnosis Dysphagia, oropharyngeal phase (R13.12);Aphasia (R47.01) Attention and concentration deficit following -- Frontal lobe and executive function deficit following Cerebral infarction Impact on safety and function Severe aspiration risk   CHL IP TREATMENT RECOMMENDATION 05/27/2017 Treatment Recommendations Therapy as outlined in treatment plan below   Prognosis 05/27/2017 Prognosis for Safe Diet  Advancement Fair  Barriers to Reach Goals Language deficits;Severity of deficits Barriers/Prognosis Comment -- CHL IP DIET RECOMMENDATION 05/27/2017 SLP Diet Recommendations NPO;Alternative means - temporary Liquid Administration via -- Medication Administration Via alternative means Compensations -- Postural Changes --   CHL IP OTHER RECOMMENDATIONS 05/27/2017 Recommended Consults -- Oral Care Recommendations Oral care QID Other Recommendations --   CHL IP FOLLOW UP RECOMMENDATIONS 05/27/2017 Follow up Recommendations Inpatient Rehab   CHL IP FREQUENCY AND DURATION 05/27/2017 Speech Therapy Frequency (ACUTE ONLY) min 3x week Treatment Duration 2 weeks      CHL IP ORAL PHASE 05/27/2017 Oral Phase Impaired Oral - Pudding Teaspoon -- Oral - Pudding Cup -- Oral - Honey Teaspoon Weak lingual manipulation;Delayed oral transit;Decreased bolus cohesion;Lingual/palatal residue;Holding of bolus Oral - Honey Cup NT Oral - Nectar Teaspoon -- Oral - Nectar Cup NT Oral - Nectar Straw NT Oral - Thin Teaspoon -- Oral - Thin Cup -- Oral - Thin Straw -- Oral - Puree Weak lingual manipulation;Delayed oral transit;Decreased bolus cohesion;Lingual/palatal residue;Holding of bolus Oral - Mech Soft NT Oral - Regular -- Oral - Multi-Consistency -- Oral - Pill -- Oral Phase - Comment --  CHL IP PHARYNGEAL PHASE 05/27/2017 Pharyngeal Phase Impaired Pharyngeal- Pudding Teaspoon -- Pharyngeal -- Pharyngeal- Pudding Cup -- Pharyngeal -- Pharyngeal- Honey Teaspoon Delayed swallow initiation-vallecula;Delayed swallow initiation-pyriform sinuses;Penetration/Aspiration during swallow Pharyngeal Material enters airway, remains ABOVE vocal cords and not ejected out Pharyngeal- Honey Cup NT Pharyngeal -- Pharyngeal- Nectar Teaspoon -- Pharyngeal -- Pharyngeal- Nectar Cup NT Pharyngeal -- Pharyngeal- Nectar Straw NT Pharyngeal -- Pharyngeal- Thin Teaspoon -- Pharyngeal -- Pharyngeal- Thin Cup -- Pharyngeal -- Pharyngeal- Thin Straw -- Pharyngeal -- Pharyngeal- Puree  Delayed swallow initiation-vallecula;Delayed swallow initiation-pyriform sinuses;Penetration/Aspiration during swallow Pharyngeal Material enters airway, remains ABOVE vocal cords and not ejected out Pharyngeal- Mechanical Soft NT Pharyngeal -- Pharyngeal- Regular -- Pharyngeal -- Pharyngeal- Multi-consistency -- Pharyngeal -- Pharyngeal- Pill -- Pharyngeal -- Pharyngeal Comment --  Gabriel Rainwater MA, CCC-SLP [phone] [place] Leah Meryl 05/27/2017, 1:59 PM                   Scheduled Meds: . aspirin  325 mg Per Tube Daily  . atorvastatin  80 mg Per Tube q1800  . chlorhexidine gluconate (MEDLINE KIT)  15 mL Mouth Rinse BID  . Chlorhexidine Gluconate Cloth  6 each Topical Daily  . clopidogrel  75 mg Per Tube Daily  . enoxaparin (LOVENOX) injection  40 mg Subcutaneous Q24H  . famotidine  20 mg Oral BID  . feeding supplement (PRO-STAT SUGAR FREE 64)  30 mL Per Tube BID  . folic acid  1 mg Oral Daily  . insulin aspart  0-15 Units Subcutaneous Q4H  . mouth rinse  15 mL Mouth Rinse BID  . multivitamin  1 tablet Oral Daily  . QUEtiapine  50 mg Oral QHS  . senna-docusate  1 tablet Oral QHS  . sodium chloride flush  10-40 mL Intracatheter Q12H  . thiamine  100 mg Oral Daily   Continuous Infusions: .  ceFAZolin (ANCEF) IV    . dexmedetomidine (PRECEDEX) IV infusion Stopped (05/27/17 1820)  . feeding supplement (OSMOLITE 1.2 CAL)    . sodium chloride (hypertonic) Stopped (05/28/17 0900)     LOS: 13 days    Time spent: 40 minutes    Jayon Matton, Geraldo Docker, MD [facility] Pager [phone]   If 7PM-7AM, please contact night-coverage [email or web address] Password [email or web address] 05/29/2017, 8:18 AM

## 2017-05-30 ENCOUNTER — Inpatient Hospital Stay (HOSPITAL_COMMUNITY): Payer: Medicaid Other

## 2017-05-30 ENCOUNTER — Encounter (HOSPITAL_COMMUNITY): Payer: Self-pay | Admitting: General Surgery

## 2017-05-30 DIAGNOSIS — R509 Fever, unspecified: Secondary | ICD-10-CM

## 2017-05-30 DIAGNOSIS — E871 Hypo-osmolality and hyponatremia: Secondary | ICD-10-CM

## 2017-05-30 LAB — CBC
HEMATOCRIT: 41.6 % (ref 39.0–52.0)
Hemoglobin: 14.2 g/dL (ref 13.0–17.0)
MCH: 28.9 pg (ref 26.0–34.0)
MCHC: 34.1 g/dL (ref 30.0–36.0)
MCV: 84.7 fL (ref 78.0–100.0)
PLATELETS: 432 10*3/uL — AB (ref 150–400)
RBC: 4.91 MIL/uL (ref 4.22–5.81)
RDW: 13.9 % (ref 11.5–15.5)
WBC: 9.7 10*3/uL (ref 4.0–10.5)

## 2017-05-30 LAB — BASIC METABOLIC PANEL
ANION GAP: 11 (ref 5–15)
BUN: 20 mg/dL (ref 6–20)
CALCIUM: 8.7 mg/dL — AB (ref 8.9–10.3)
CO2: 22 mmol/L (ref 22–32)
Chloride: 104 mmol/L (ref 101–111)
Creatinine, Ser: 0.72 mg/dL (ref 0.61–1.24)
GLUCOSE: 146 mg/dL — AB (ref 65–99)
POTASSIUM: 3.1 mmol/L — AB (ref 3.5–5.1)
Sodium: 137 mmol/L (ref 135–145)

## 2017-05-30 LAB — SODIUM
SODIUM: 138 mmol/L (ref 135–145)
SODIUM: 139 mmol/L (ref 135–145)
Sodium: 138 mmol/L (ref 135–145)

## 2017-05-30 LAB — MAGNESIUM: Magnesium: 1.9 mg/dL (ref 1.7–2.4)

## 2017-05-30 LAB — PHOSPHORUS: Phosphorus: 3.1 mg/dL (ref 2.5–4.6)

## 2017-05-30 LAB — GLUCOSE, CAPILLARY
GLUCOSE-CAPILLARY: 147 mg/dL — AB (ref 65–99)
GLUCOSE-CAPILLARY: 144 mg/dL — AB (ref 65–99)
GLUCOSE-CAPILLARY: 131 mg/dL — AB (ref 65–99)
GLUCOSE-CAPILLARY: 169 mg/dL — AB (ref 65–99)
GLUCOSE-CAPILLARY: 163 mg/dL — AB (ref 65–99)
Glucose-Capillary: 146 mg/dL — ABNORMAL HIGH (ref 65–99)

## 2017-05-30 MED ORDER — POTASSIUM CHLORIDE 20 MEQ/15ML (10%) PO SOLN
20.0000 meq | Freq: Three times a day (TID) | ORAL | Status: AC
  Administered 2017-05-30 – 2017-05-31 (×6): 20 meq
  Filled 2017-05-30 (×6): qty 15

## 2017-05-30 MED ORDER — JEVITY 1.2 CAL PO LIQD
1000.0000 mL | ORAL | Status: DC
  Administered 2017-05-30 – 2017-06-01 (×3): 1000 mL
  Administered 2017-06-01: 17:00:00
  Administered 2017-06-03: 1000 mL
  Filled 2017-05-30 (×10): qty 1000

## 2017-05-30 MED ORDER — METOPROLOL TARTRATE 12.5 MG HALF TABLET
12.5000 mg | ORAL_TABLET | Freq: Two times a day (BID) | ORAL | Status: DC
  Administered 2017-05-30 – 2017-06-02 (×7): 12.5 mg via ORAL
  Filled 2017-05-30 (×7): qty 1

## 2017-05-30 NOTE — Care Management Note (Addendum)
Case Management Note  Patient Details  Name: Henry David MRN: [id number] Date of Birth: 05/03/1961  Subjective/Objective:  Presents with CVA, non responive,  Pt rec CIR , CSW  Also following for LOG SNF placement if needed.    10/22 1255 Letha Cape RN, BSN - patient may be ready for SNF with LOG tomorrow per MD, NCM made CSW aware.                  Action/Plan: CSW following for LOG SNF placement.  Expected Discharge Date:                  Expected Discharge Plan:  Skilled Nursing Facility  In-House Referral:  Clinical Social Work  Discharge planning Services  CM Consult  Post Acute Care Choice:    Choice offered to:     DME Arranged:    DME Agency:     HH Arranged:    HH Agency:     Status of Service:  In process, will continue to follow  If discussed at Long Length of Stay Meetings, dates discussed:    Additional Comments:  Leone Haven, RN 05/30/2017, 6:53 PM

## 2017-05-30 NOTE — Progress Notes (Signed)
Nutrition Consult/Follow Up  DOCUMENTATION CODES:   Obesity unspecified  INTERVENTION:    Increase Jevity 1.2 formula at goal rate of 70 ml/hr  Continue Pro-stat 30 ml BID via tube  Provides 2216 kcals, 123 gm protein, 1356 ml of free water daily  NUTRITION DIAGNOSIS:   Inadequate oral intake related to  (agitation) as evidenced by NPO status, ongoing  GOAL:   Patient will meet greater than or equal to 90% of their needs, progressing   MONITOR:   TF tolerance, PO intake, Labs, Weight trends, Skin, I & O's  ASSESSMENT:   56 y/o male PMHx CAD, Anxiety, Depression, HTN, PTSD, MI, drug and etoh abuse. Roommate found patient unresponsive. Arrived via EMS with R hemiplegia. Worked up for large, evolving MCA infarct. Intubated for airway protection  10/13 Cortrak placed, pulled out 10/15 10/18 G-tube placed  Transferred out of 4N-ICU 10/18. Jevity 1.2 formula infusing at 50 ml/hr via G-tube. Per RN, pt not eating. S/p dysphagia; cognitive-linguistic treatment per SLP today.  Medications reviewed and include: folvite, MVI, thiamine. Labs reviewed. K 3.1 (L). Ca 8.7 (L). CBG's [id number].  Diet Order:  DIET - DYS 1 Room service appropriate? Yes; Fluid consistency: Honey Thick   Skin:  Reviewed, no issues   Intake/Output Summary (Last 24 hours) at 05/30/17 1430 Last data filed at 05/30/17 1045  Gross per 24 hour  Intake             1057 ml  Output              500 ml  Net              557 ml   Last BM:  10/17  Height:   Ht Readings from Last 1 Encounters:  05/16/17 5\' 10"  (1.778 m)   Weight:   Wt Readings from Last 1 Encounters:  05/30/17 196 lb (88.9 kg)   Ideal Body Weight:  75.45 kg  BMI:  Body mass index is 28.12 kg/m.  Estimated Nutritional Needs:   Kcal:  2000-2200  Protein:  110-125 grams  Fluid:  > 2 L/day  EDUCATION NEEDS:   No education needs identified at this time  Maureen Chatters, RD, LDN Pager #: [phone] After-Hours Pager #:  [phone]

## 2017-05-30 NOTE — Plan of Care (Signed)
Problem: Self-Care: Goal: Ability to participate in self-care as condition permits will improve Outcome: Progressing Patient intermittently responsive and with purposeful movement of his left arm.  He picked up a plate that was on the bedside table and attempted to eat on his on today.

## 2017-05-30 NOTE — Progress Notes (Signed)
  Speech Language Pathology Treatment: Dysphagia;Cognitive-Linquistic  Patient Details Name: Henry David MRN: [id number] DOB: [birth year] Today's Date: 05/30/2017 Time: 4665-9935 SLP Time Calculation (min) (ACUTE ONLY): 9 min  Assessment / Plan / Recommendation Clinical Impression  Pt is lethargic today, despite stimulation provided by SLP. He does stir and open his eyes during oral care, but he falls immediately back to sleep. Pt has no signs of awareness when dry spoon is brought to his lips. Per RN, pt did not sleep throughout the night. Recommend to continue with current diet and precautions but only when fully alert.    HPI HPI: Henry David a 56 y.o.malewith history of anxiety, depression, diverticulitis, GERD, cocaine use,history of apical mural thrombus, hypertension, hyperlipidemia and coronary artery diseasepresenting with global aphasia, left gaze preference and right hemiplegia. Hedidnot receive IV t-PA due to late presentation. Intubated 10/5-10/9. Head MRI 10/10 shows large evolving acute ischemic infarct involving essentially the entirety of the left MCA territory, similar to previous exams and associated mass effect with 12 mm of left-to-right midline shift, worsened relative to most recent CT from 05/18/2017.       SLP Plan  Continue with current plan of care       Recommendations  Diet recommendations: Dysphagia 1 (puree);Honey-thick liquid Liquids provided via: Cup Medication Administration: Crushed with puree Supervision: Full supervision/cueing for compensatory strategies Compensations: Slow rate;Small sips/bites Postural Changes and/or Swallow Maneuvers: Seated upright 90 degrees                Oral Care Recommendations: Oral care before and after PO Follow up Recommendations: Inpatient Rehab SLP Visit Diagnosis: Dysphagia, oropharyngeal phase (R13.12);Cognitive communication deficit (T01.779) Plan: Continue with current plan of  care       GO                Henry David 05/30/2017, 12:21 PM  Henry David, M.A. CCC-SLP [phone]

## 2017-05-30 NOTE — Progress Notes (Signed)
Physical Therapy Treatment Patient Details Name: Henry David MRN: [id number] DOB: 10/17/1960 Today's Date: 05/30/2017    History of Present Illness Pt is a 56 y.o. male with history of CAD, Cocaine use, Anxiety, Apical mural thromus, Depression, Essential HTN, PTSD, STEMI, Knee sx, Cardiac cath x3. Pt prsented with aphasia and R sided weakness. CT on 10/5 showed large L MCA infarct. CT on 10/13 showed increased intracranial mass effect with right midline shift now 14 mm.    PT Comments    Pt sleeping heavily. Unable to arouse to actively participate in therapy. Bed pad used to helicopter to EOB in hope of waking him up. Pt still sleeping despite sitting up, requiring total assist to maintain sitting balance. Returned to supine +2 total assist after approx 5 minutes.    Follow Up Recommendations  CIR     Equipment Recommendations  Wheelchair (measurements PT);Wheelchair cushion (measurements PT);Hospital bed    Recommendations for Other Services       Precautions / Restrictions Precautions Precautions: None;Other (comment) Precaution Comments: L wrist restraint    Mobility  Bed Mobility         Supine to sit: +2 for physical assistance;Total assist Sit to supine: +2 for physical assistance;Total assist   General bed mobility comments: Pt asleep. Unable to arouse. Bed pad utilized to helicopter to EOB in hope of increasing arousal. Pt remained asleep despite sitting and NT bathing pt.  Transfers                    Ambulation/Gait                 Stairs            Wheelchair Mobility    Modified Rankin (Stroke Patients Only) Modified Rankin (Stroke Patients Only) Pre-Morbid Rankin Score: No symptoms Modified Rankin: Severe disability     Balance   Sitting-balance support: Feet supported;No upper extremity supported Sitting balance-Leahy Scale: Zero Sitting balance - Comments: total assist to maintain sitting EOB due to pt sleeping.  Unable to arouse. Only opened his eyes briefly < 30 seconds due to NT washing his face with cold washcloth.                                    Cognition Arousal/Alertness: Lethargic   Overall Cognitive Status: Difficult to assess                                        Exercises      General Comments        Pertinent Vitals/Pain Pain Assessment: Faces Faces Pain Scale: No hurt    Home Living                      Prior Function            PT Goals (current goals can now be found in the care plan section) Acute Rehab PT Goals Patient Stated Goal: unable to state PT Goal Formulation: Patient unable to participate in goal setting Time For Goal Achievement: 06/08/17 Potential to Achieve Goals: Fair Progress towards PT goals: Not progressing toward goals - comment (unable to arouse for active participation)    Frequency    Min 4X/week      PT Plan Current plan remains appropriate  Co-evaluation              AM-PAC PT "6 Clicks" Daily Activity  Outcome Measure  Difficulty turning over in bed (including adjusting bedclothes, sheets and blankets)?: Unable Difficulty moving from lying on back to sitting on the side of the bed? : Unable Difficulty sitting down on and standing up from a chair with arms (e.g., wheelchair, bedside commode, etc,.)?: Unable Help needed moving to and from a bed to chair (including a wheelchair)?: Total Help needed walking in hospital room?: Total Help needed climbing 3-5 steps with a railing? : Total 6 Click Score: 6    End of Session   Activity Tolerance: Patient limited by lethargy Patient left: in bed;with call bell/phone within reach;with nursing/sitter in room Nurse Communication: Mobility status;Need for lift equipment PT Visit Diagnosis: Difficulty in walking, not elsewhere classified (R26.2);Muscle weakness (generalized) (M62.81);Hemiplegia and hemiparesis Hemiplegia - Right/Left:  Right Hemiplegia - dominant/non-dominant: Dominant Hemiplegia - caused by: Cerebral infarction     Time: 1125-1140 PT Time Calculation (min) (ACUTE ONLY): 15 min  Charges:  $Therapeutic Activity: 8-22 mins                    G Codes:       Aida Raider, PT  Office # [phone] Pager [phone]    Ilda Foil 05/30/2017, 12:37 PM

## 2017-05-30 NOTE — Progress Notes (Signed)
CSW attempted to call Greig Castilla to discuss pt case and see if he was able to find out pt lawyer information- left message  CSW has initiated bed search for LOG facility  CSW will continue to follow and assist as needed  Burna Sis, LCSW Clinical Social Worker [phone]

## 2017-05-30 NOTE — Progress Notes (Signed)
PROGRESS NOTE    Henry David  BDZ:[id number] DOB: [birth year] DOA: 05/16/2017 PCP: Arnoldo Morale, MD   Brief Narrative:  56 year old WM PMHx Anxiety; Depression,PTSD, Apical mural thrombus; STEMI,HTN,CAD, HLD, BPH (benign prostatic hyperplasia);  Diverticulitis;   Found down earlier today. He was last seen normal at 2 AM and it is 1816 at time of this dictation. CT scan of the head is already showing a large area of MCA territory infarction. 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 He is unable to provide any history and on arrival in the intensive care unit his respirations were intermittently sonorous and I have intubated him for airway protection.    Subjective: 10/19 MAXIMUM TEMPERATURE last 24 hours 38.2C. staff reported patient did not sleep well overnight. Received one dose of Haldol. Patient seemed sleepy, difficult to arouse.       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 -secondary to Cocaine abuse,EtOH abuse and Essential HTN. -Severe encephalopathy, waxing and waning. Minimally follows commands today. Difficult to arouse most likely secondary to not sleeping well during the night and administration of Haldol. If continues will consider repeat MRI: Extension CVA (possible but unlikely)  -stroke team  consulted -PT/OT: Recommended CIR  Depression/ Anxiety. -Unable to evaluate secondary patient's large CVA, being nonverbal.  Chronic Systolic CHF -Strict in and out since admission -3.3 L -Daily weight Filed Weights   05/28/17 0500 05/29/17 1200 05/30/17 0500  Weight: 201 lb 4.5 oz (91.3 kg) 198 lb 3.1 oz (89.9 kg) 196 lb (88.9 kg)  -10/19 start Metoprolol 12.5 mg BID; secondary heart failure (patient far enough outside CVA Window) -Transfusion hemoglobin< 8  Essential HTN -See CHF.  CAD -See CHF  OSA? -Patient's cognition still poor would not start CPAP at this time.  -ABG 10/20   DM Type 2 controlled with complications -34/1 Hemoglobin A1c= 6.1  Hypertriglyceridemia/HLD -Lipitor 80 mg daily -Niacin 500 mg daily  Hyponatremia -with treated with 3% saline. Resolved  Hypokalemia. -potassium goal> 4 -Potassium 20 mEq TID   Dysphagia -Dysphagia 1-thick liquid  FUO -Patient continues to spike fevers. Negative leukocytosis, negative bands negative left shift may be reactive. -To ensure completeness panculture       DVT prophylaxis: Lovenox Code Status: Full Family Communication: none Disposition Plan: CIR?   Consultants:  Stroke team [facility] M    Procedures/Significant Events:  10/5 - Presents to ED with large Left MCA CVA  CT Head 10/5 > Large left MCA nonhemorrhagic infarct with minimal sparing of super ganglionic cortex. Mass effect with effacement of the sulci and partial effacement of the left lateral ventricle. 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 10/6 - 3% NS 10/6 Echocardiogram > LVEF 45-50%,  Akinesis of the mid-apical anteroseptal, apical  inferoseptal, and apical myocardium. Hypokinesis of the apical inferior myocardium. - no PFO or mural thrombus   CT Head 10/7> L MCA cytotoxic edema with 4 mm midline shift  10/9 Extubated  10/10 enceph bad, precedex     MRI/MRA Head 10/10 > Evolving L MCA territory infarct with worsened edema, 12 mm midline shift, Occlusion of L ICA CT head 10/13>>> 1. Continued increased intracranial mass effect from the large left MCA territory infarct. Rightward midline shift now 14 mm (increased from 12 mm on 05/21/2017). 2. Petechial hemorrhage as seen by MRI, but no malignant hemorrhagic transformation   EEG 05/26/2017>> findings indicates bilateral cerebral dysfunction that is non-specific in etiology and can be seen with hypoxic/ischemic injury, toxic/metabolic encephalopathies, or medication effect.  The absence of epileptiform discharges does not rule out a clinical diagnosis of epilepsy.  Clinical correlation is advised. 10/16- Slow wean of 3% Saline per neuro started 9 am ( D/C 10/17 at 9 am), failed SLP 10/18 PEG tube placement 10/19 PCXR pending   I have personally reviewed and interpreted all radiology studies and my findings are as above.  VENTILATOR SETTINGS: none   Cultures MRSA PCR 10/5 > Negative  10/19 blood pending 10/19 urine pending    Antimicrobials: Anti-infectives    Start     Stop   05/29/17 1100  ceFAZolin (ANCEF) IVPB 2g/100 mL premix     05/29/17 1103       Devices    LINES / TUBES:  ETT 10/5 >> 10/9 Right Subclavian CVC 10/5 >> 10/12 R IJ CVC 10/13 >>      Continuous Infusions: . dexmedetomidine (PRECEDEX) IV infusion Stopped (05/27/17 1820)  . feeding supplement (JEVITY 1.2 CAL) 1,000 mL (05/30/17 0443)  . feeding supplement (OSMOLITE 1.2 CAL)       Objective: Vitals:   05/30/17 0424 05/30/17 0500 05/30/17 0800 05/30/17 1200  BP: 110/71  132/78 126/75  Pulse: (!) 113  (!) 109 91  Resp: 19  18 (!) 24  Temp: (!) 100.6 F (38.1 C)  98.1 F (36.7 C) 98.9 F (37.2 C)  TempSrc: Axillary  Oral Oral  SpO2: 91%  95% 92%  Weight:  196 lb (88.9 kg)    Height:        Intake/Output Summary (Last 24 hours) at 05/30/17 1355 Last data filed at 05/30/17 1045   Gross per 24 hour  Intake             1057 ml  Output              975 ml  Net               82 ml   Filed Weights   05/28/17 0500 05/29/17 1200 05/30/17 0500  Weight: 201 lb 4.5 oz (91.3 kg) 198 lb 3.1 oz (89.9 kg) 196 lb (88.9 kg)    Physical Exam:  General: Sleepy, difficult to arouse, minimally follows commands, no acute respiratory distress Neck:  Negative scars, masses, torticollis, lymphadenopathy, JVD, right IJ removed area covered and clean negative sign of infection. Lungs: Clear to auscultation bilaterally without wheezes or crackles Cardiovascular: Regular rate and rhythm without murmur gallop or rub normal S1 and S2 Abdomen: negative abdominal pain, nondistended, positive soft, bowel sounds, no rebound, no ascites, no appreciable mass, PEG tube present Extremities: No significant cyanosis, clubbing, or edema bilateral lower extremities Skin: Negative rashes, lesions, ulcers Psychiatric:  Unable to evaluate secondary to CVA  Central  nervous system:   unable to evaluate secondary patient's sleepiness, difficulty to arouse. .  .     Data Reviewed: Care during the described time interval was provided by me .  I have reviewed this patient's available data, including medical history, events of note, physical examination, and all test results as part of my evaluation.   CBC:  Recent Labs Lab 05/25/17 0315 05/26/17 0314 05/27/17 0412 05/28/17 0534 05/30/17 1212  WBC 8.7 9.1 8.5 11.4* 9.7  NEUTROABS  --   --  5.7 8.4*  --   HGB 12.3* 12.1* 11.8* 13.5 14.2  HCT 36.3* 36.6* 35.4* 39.6 41.6  MCV 83.8 84.3 84.3 83.7 84.7  PLT 264 283 311 409* 350*   Basic Metabolic Panel:  Recent Labs Lab 05/26/17 0314  05/27/17 0412  05/28/17 0534  05/28/17 2029 05/28/17 2345 05/29/17 0906 05/29/17 1327 05/29/17 1903 05/30/17 0024 05/30/17 0644  NA 146*  < > 145  < > 141  < > 135 133* 137 138 137 138 137  K 3.1*  --  3.3*  --  2.9*  --  3.0* 3.2* 3.0*  --   --   --  3.1*    CL 118*  --  118*  --  110  --  104 104 105  --   --   --  104  CO2 23  --  22  --  21*  --  20* 21* 21*  --   --   --  22  GLUCOSE 122*  --  111*  --  100*  --  116* 121* 102*  --   --   --  146*  BUN 18  --  12  --  12  --  '13 13 16  '$ --   --   --  20  CREATININE 0.69  --  0.67  --  0.72  --  0.70 0.68 0.81  --   --   --  0.72  CALCIUM 8.1*  --  8.0*  --  8.3*  --  8.3* 8.2* 8.5*  --   --   --  8.7*  MG 2.1  --   --   --  2.1  --   --  2.0 2.0  --   --   --  1.9  PHOS 2.5  --  3.5  --  3.1  --   --  3.3  --   --   --   --  3.1  < > = values in this interval not displayed. GFR: Estimated Creatinine Clearance: 113 mL/min (by C-G formula based on SCr of 0.72 mg/dL). Liver Function Tests: No results for input(s): AST, ALT, ALKPHOS, BILITOT, PROT, ALBUMIN in the last 168 hours. No results for input(s): LIPASE, AMYLASE in the last 168 hours. No results for input(s): AMMONIA in the last 168 hours. Coagulation Profile: No results for input(s): INR, PROTIME in the last 168 hours. Cardiac Enzymes: No results for input(s): CKTOTAL, CKMB, CKMBINDEX, TROPONINI in the last 168 hours. BNP (last 3 results) No results for input(s): PROBNP in the last 8760 hours. HbA1C: No results for input(s): HGBA1C in the last 72 hours. CBG:  Recent Labs Lab 05/29/17 1908 05/29/17 2252 05/30/17 0425 05/30/17 0744 05/30/17 1150  GLUCAP 127* 147* 147* 144* 131*   Lipid Profile: No results for input(s): CHOL, HDL, LDLCALC, TRIG, CHOLHDL, LDLDIRECT in the last 72 hours. Thyroid Function Tests: No results for input(s): TSH, T4TOTAL, FREET4, T3FREE, THYROIDAB in  the last 72 hours. Anemia Panel: No results for input(s): VITAMINB12, FOLATE, FERRITIN, TIBC, IRON, RETICCTPCT in the last 72 hours. Urine analysis:    Component Value Date/Time   COLORURINE YELLOW 05/20/2017 0948   APPEARANCEUR CLEAR 05/20/2017 0948   LABSPEC 1.021 05/20/2017 0948   PHURINE 7.0 05/20/2017 0948   GLUCOSEU NEGATIVE 05/20/2017 0948    GLUCOSEU NEGATIVE 06/20/2008 1444   HGBUR NEGATIVE 05/20/2017 0948   BILIRUBINUR NEGATIVE 05/20/2017 0948   KETONESUR NEGATIVE 05/20/2017 0948   PROTEINUR NEGATIVE 05/20/2017 0948   UROBILINOGEN 0.2 mg/dL 06/20/2008 1444   NITRITE NEGATIVE 05/20/2017 0948   LEUKOCYTESUR NEGATIVE 05/20/2017 0948   Sepsis Labs: '@LABRCNTIP'$ (procalcitonin:4,lacticidven:4)  )No results found for this or any previous visit (from the past 240 hour(s)).       Radiology Studies: No results found.      Scheduled Meds: . aspirin  325 mg Per Tube Daily  . atorvastatin  80 mg Per Tube q1800  . chlorhexidine gluconate (MEDLINE KIT)  15 mL Mouth Rinse BID  . Chlorhexidine Gluconate Cloth  6 each Topical Daily  . clopidogrel  75 mg Per Tube Daily  . enoxaparin (LOVENOX) injection  40 mg Subcutaneous Q24H  . feeding supplement (PRO-STAT SUGAR FREE 64)  30 mL Per Tube BID  . folic acid  1 mg Oral Daily  . insulin aspart  0-15 Units Subcutaneous Q4H  . mouth rinse  15 mL Mouth Rinse BID  . multivitamin  1 tablet Oral Daily  . niacin  500 mg Oral Daily  . pantoprazole  40 mg Oral Daily  . potassium chloride  20 mEq Per Tube TID  . QUEtiapine  50 mg Oral QHS  . senna-docusate  1 tablet Oral QHS  . sodium chloride flush  10-40 mL Intracatheter Q12H  . thiamine  100 mg Oral Daily   Continuous Infusions: . dexmedetomidine (PRECEDEX) IV infusion Stopped (05/27/17 1820)  . feeding supplement (JEVITY 1.2 CAL) 1,000 mL (05/30/17 0443)  . feeding supplement (OSMOLITE 1.2 CAL)       LOS: 14 days    Time spent: 40 minutes    WOODS, Geraldo Docker, MD [facility] Pager [phone]   If 7PM-7AM, please contact night-coverage [email or web address] Password [email or web address] 05/30/2017, 1:55 PM

## 2017-05-30 NOTE — Progress Notes (Signed)
Patient tolerating tube feedings fine through new PEG.  No swelling at the site.  Henry David is a bit small, only needs to cover the tube, does not need to be tight.  Marta Lamas. Gae Bon, MD, FACS [phone] Trauma Surgeon

## 2017-05-30 NOTE — Progress Notes (Signed)
Pt pulled off abdominal binder and new PEG dressing. On-call NP notified.

## 2017-05-30 NOTE — Progress Notes (Signed)
STROKE TEAM PROGRESS NOTE    HISTORY FROM NEUROLOGY CONSULT 05/16/2017 Henry David is a 56 y.o. male was a past medical history of anxiety, depression, hypertension, hyperlipidemia and coronary artery disease, last seen normal at 2 AM by his roommate, and did not wake up this evening. Upon checking on him, he had gaze to the left, was not talking and was unable to move the right side of his body. EMS was called and they brought him to the hospital emergency room here at [facility] as an acute code stroke. Patient is unable to provide any further history. There is no other family member attendant at the bedside to provide history. I examined the patient as he was rolled in as an acute code stroke in the emergency room bridge. Initial exam was consistent with a complete left MCA syndrome. 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 He was outside the TPA window on presentation  LKW: 0200 hrs. On 05/16/2017 tpa given?: no, outside the window Premorbid modified Rankin scale (mRS):  0    SUBJECTIVE (INTERVAL HISTORY) No family members present. No events overnight.    OBJECTIVE Temp:  [98.4 F (36.9 C)-100.8 F (38.2 C)] 100.6 F (38.1 C) (10/19 0424) Pulse Rate:  [87-122] 113 (10/19 0424) Cardiac Rhythm: Sinus tachycardia (10/19 0443) Resp:  [13-19] 19 (10/19 0424) BP: (110-148)/(59-88) 110/71 (10/19 0424) SpO2:  [90 %-100 %] 91 % (10/19 0424) Weight:  [196 lb (88.9 kg)-198 lb 3.1 oz (89.9 kg)] 196 lb (88.9 kg) (10/19 0500)  CBC:   Recent Labs Lab 05/27/17 0412 05/28/17 0534  WBC 8.5 11.4*  NEUTROABS 5.7 8.4*  HGB 11.8* 13.5  HCT 35.4* 39.6  MCV 84.3 83.7  PLT 311 409*    Basic Metabolic Panel:   Recent Labs Lab 05/28/17 0534  05/28/17 2345  05/29/17 0906  05/29/17 1903 05/30/17 0024  NA 141  < > 133* 137  < > 137 138  K 2.9*  < > 3.2* 3.0*  --   --   --   CL 110  < > 104 105  --   --   --   CO2 21*  < > 21* 21*  --   --   --   GLUCOSE 100*  < > 121* 102*  --   --   --   BUN 12  < > 13 16  --   --   --   CREATININE 0.72  < > 0.68 0.81  --   --   --   CALCIUM 8.3*  < > 8.2* 8.5*  --   --   --   MG 2.1  --  2.0 2.0  --   --   --   PHOS 3.1  --  3.3  --   --   --   --   < > = values in this interval not displayed.  Lipid Panel:     Component Value Date/Time   CHOL 186 05/17/2017 0500   CHOL 114 12/12/2016 1118   TRIG 532 (H) 05/17/2017 0500   HDL 26 (L) 05/17/2017 0500   HDL 27 (L) 12/12/2016 1118   CHOLHDL 7.2 05/17/2017 0500   VLDL UNABLE TO CALCULATE IF TRIGLYCERIDE OVER 400 mg/dL 38/45/3646 8032  LDLCALC UNABLE TO CALCULATE IF TRIGLYCERIDE OVER 400 mg/dL 69/62/9528 4132   LDLCALC 40 12/12/2016 1118   HgbA1c:  Lab Results  Component Value Date   HGBA1C 6.1 (H) 05/17/2017   Urine Drug Screen:     Component Value Date/Time   LABOPIA NONE DETECTED 05/16/2017 1902   COCAINSCRNUR POSITIVE (A) 05/16/2017 1902   LABBENZ POSITIVE (A) 05/16/2017 1902   AMPHETMU NONE DETECTED 05/16/2017 1902   THCU NONE DETECTED 05/16/2017 1902   LABBARB NONE DETECTED 05/16/2017 1902    Alcohol Level     Component Value Date/Time   ETH <10 05/16/2017 1535    IMAGING I have personally reviewed the radiological images below and agree with the radiology interpretations.  [name] 05/21/2017 1. Large evolving acute ischemic infarct involving essentially the entirety of the left MCA territory, similar to previous exams. Associated petechial hemorrhage without frank hemorrhagic transformation, most notable at the left lentiform nucleus. 2. Associated mass effect with 12 mm of left-to-right midline shift, worsened relative to most recent CT from 05/18/2017. No hydrocephalus or ventricular trapping.  Mr Maxine Glenn Head Wo  Contrast 05/21/2017 1. Occlusion of the left ICA at the level of the terminus with absent perfusion within the left middle cerebral artery. 2. Patent anterior communicating artery with perfusion of both anterior cerebral arteries, and minimal patchy retrograde flow within the left A1 segment. 3. Widely patent right middle cerebral artery and posterior Circulation.  Ct Head Wo Contrast 05/16/2017 IMPRESSION:  1. Large left MCA nonhemorrhagic infarct with minimal sparing of super ganglionic cortex.  2. Mass effect with effacement of the sulci and partial effacement of the left lateral ventricle.  3. ASPECTS is 10/10   05/18/2017 Unchanged appearance of cytotoxic edema throughout the left MCA territory with unchanged rightward midline shift measuring 4 mm. No hemorrhage. No ventricular entrapment or hydrocephalus.  05/17/2017 Large acute infarct in the distribution of the left middle cerebral artery.  Since the previous study, there is increasing mass effect with increased effacement of the left lateral ventricle.  Mild developing left-to-right midline shift of about 4 mm.  No acute intracranial hemorrhage.    05/24/2017   Rightward midline shift now 14 mm (increased from 12 mm on 05/21/2017). 2. Petechial hemorrhage as seen by MRI, but no malignant hemorrhagic transformation. 3. No new intracranial abnormality. Electronically Signed: By: Odessa Fleming M.D. On: 05/24/2017 17:28   Ct Cerebral Perfusion W Contrast 05/16/2017 IMPRESSION:  Large left MCA territory nonhemorrhagic infarct neck exceeds size criteria for intra-arterial intervention.  Ct Angio Head W Or Wo Contrast Ct Angio Neck W Or Wo Contrast 05/16/2017 IMPRESSION:  1. 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  2. No significant collateral flow to the left MCA territory.  3. Patent anterior communicating artery with retrograde filling of the left A1 segment.   4. Minimal atherosclerotic calcification within the cavernous right internal carotid artery.  5. No other significant atherosclerotic disease within the head or neck.  6. Degenerative changes of the cervical spine are most pronounced at C6-7.   Dg Chest Port 1 View 05/19/2017 Stable hypoinflation.  No pneumonia nor significant pulmonary edema. The support tubes are in reasonable position.  05/16/2017 1. Low lung volumes.  2. Satisfactory positioning of support apparatus as above.   05/23/17 1. Low lung volumes. 2. Mild pulmonary vascular congestion and small effusions may reflect neurogenic edema versus cardiac. 3. Minimal bibasilar airspace disease likely reflects  atelectasis.  Dg Chest Port 1 View 05/25/2017 IMPRESSION: Stable support apparatus. Hypoinflation of the lungs with mild bibasilar subsegmental atelectasis.   Transthoracic Echocardiogram 05/17/2017 LVEF 45-50% Akinesis of the mid-apical anteroseptal, apical inferoseptal, and apical myocardium. Hypokinesis of the apical inferior myocardium.      PHYSICAL EXAM Vitals:   05/29/17 1900 05/29/17 2254 05/30/17 0424 05/30/17 0500  BP: 118/84 119/72 110/71   Pulse: (!) 109 (!) 122 (!) 113   Resp: 19 16 19    Temp: 98.6 F (37 C) 98.6 F (37 C) (!) 100.6 F (38.1 C)   TempSrc: Oral Oral Axillary   SpO2: 93% 90% 91%   Weight:    196 lb (88.9 kg)  Height:        Physical exam: General - lethargic, global aphasic  Mental Status - lethargic unable to answer questions or follow commands. Global aphasia.   Cranial Nerves II - XII - II - no blinking to visual threat from right III, IV, VI - Left gaze preference but able to cross midline. VII - Right facial droop XII - tongue midline  Motor Strength - Left sided movements spontaneous at least 4/5 strength, right UE mild withdraw on pain, RLE 2/5 with pain stimulation   Motor Tone - Flaccid on RUE and RLE, normal LLE and LUE  Reflexes - Positive babinski on  R  Sensation, coordination and gait not able to test.   ASSESSMENT/PLAN Henry David is a 56 y.o. male with history of anxiety, depression, cocaine use, history of apical mural thrombus, hypertension, hyperlipidemia and coronary artery disease, presenting with aphasia and right sided weakness. He did not receive IV t-PA due to late presentation.  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 Mild developing left-to-right midline shift of about 4 mm.   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  MRI head - left malignant MCA infarct with midline shift about 10mm  MRA head - left ICA terminus persistent occlusion, no left MCA visualized   2D Echo - LVEF 45% to 50%  LDL - 26  HgbA1c - 6.1  VTE prophylaxis NPO on TF @ 15  aspirin 81 mg daily and clopidogrel 75 mg daily prior to admission, now on ASA 325mg  and clopidogrel 75mg   Ongoing aggressive stroke risk factor management  Therapy recommendations:  - Possible CIR candidate.  Disposition:  Pending  Cerebral Edema  CT and MRI showed midline shift up to 10mm  Hypertonic saline - weaned off 05/23/17  However, CT repeat 05/24/17 showed progressive midline shift  3% restarted @ 75 with one dose of mannitol and 23.4%  Na - 138  Na Q6h  Hx of apical thrombus  12/2015 TTE showed an apical thrombus on definity evaluation, EF 40-45%  Started on coumadin in 12/2015  Repeat TTE 06/2016 EF 40-45% no evidence of thrombus  Coumadin stopped on 07/2016  Delirium  Pt agitated and encephalopathic 05/22/17  Concerning for  substance vs. Alcohol withdraw  on precedex, wean off as able  on ativan and risperdal bid with NG  On FA/B1/MVI  CCM on board  Hypertension  Stable, BP at low side  Gradually decrease BP to goal   Long-term BP goal 130-150 due to left ICA occlusion  Hyperlipidemia  Home meds:  Lipitor 80 mg daily - resumed in hospital  LDL 26, goal < 70  Continue statin at discharge  Cocaine abuse  UDS positive for cocaine  Cessation counseling will be provided  Dysphagia   Did not pass swallow  Had cortrak placed  Continue TF @ 15  Other Stroke Risk Factors  Former cigarette smoker - quit  ETOH use, will be advised to drink no more than 1 drink per day.  Obesity, Body mass index is 28.12 kg/m., recommend weight loss, diet and exercise as appropriate   Coronary artery disease s/p stent on DAPT  Other Active Problems  Hypokalemia -> replacing -> 3.1 -> supplement further -> recheck in AM  CXR stable  PEG placed 05/29/2017 -> OK to use.      Personally examined patient and images, and have participated in and made any corrections needed to history, physical, neuro exam,assessment and plan as stated above.  I have personally obtained the history, evaluated lab date, reviewed imaging studies and agree with radiology interpretations.    Naomie Dean, MD Stroke Neurology  Hospital day # 14  This patient is critically ill due to left malignant MCA, cerebral edema, history of apical mural thrombus and at significant risk of neurological worsening, death form brain herniation, heart failure, recurrent stroke, seizure. This patient's care requires constant monitoring of vital signs, hemodynamics, respiratory and cardiac monitoring, review of multiple databases, neurological assessment, discussion with family, other specialists and medical decision making of high complexity. I spent 30 minutes of neurocritical care time in the care of this patient.      To contact  Stroke Continuity provider, please refer to [email or web address]. After hours, contact General Neurology

## 2017-05-30 NOTE — Plan of Care (Signed)
Problem: Nutrition: Goal: Adequate nutrition will be maintained Outcome: Progressing Pt was started on Jevity 1.2 tube feeding today. Pt has tolerated gradual increase of tube feeds during this shift.

## 2017-05-30 NOTE — NC FL2 (Signed)
[place] LEVEL OF CARE SCREENING TOOL     IDENTIFICATION  Patient Name: Henry David Birthdate: July 03, 1961 Sex: male Admission Date (Current Location): 05/16/2017  [facility] [place] Number:  Herbalist and Address:  [facility], [place] [address], [place], [place] [address]      Provider Number: [id number]  Attending Physician Name and Address:  Allie Bossier, MD  Relative Name and Phone Number:       Current Level of Care: Hospital Recommended Level of Care: [place] Prior Approval Number:    Date Approved/Denied:   PASRR Number: [id number]  Discharge Plan: SNF    Current Diagnoses: Patient Active Problem List   Diagnosis Date Noted  . Dysphagia, post-stroke   . Right hemiparesis ([place])   . Benign essential HTN   . Coronary artery disease involving native coronary artery of native heart without angina pectoris   . Agitation   . Hypokalemia   . Hypernatremia   . Acute blood loss anemia   . Cocaine abuse ([place])   . Encounter for central line care   . Encounter for orogastric (OG) tube placement   . Ventilator dependent ([place])   . Acute respiratory failure ([place])   . On mechanically assisted ventilation ([place])   . Cerebral edema ([place]) 05/17/2017  . Brain herniation ([place]) 05/17/2017  . Acute ischemic stroke ([place]) 05/16/2017  . 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 Long term (current) use of anticoagulants [Z79.01] 01/03/2016  . Chest pain 12/29/2015  . Apical mural thrombus   . Pain in the chest   . Essential hypertension   . BPH (benign prostatic hyperplasia)   . Lower urinary tract symptoms 10/04/2015  . Obesity 10/04/2015  . Cardiomyopathy, ischemic 07/20/2015  . Prediabetes 07/13/2015  . Presence of drug coated stent in right coronary artery 07/12/2015  . 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 Hyperlipidemia with target LDL less than 70 07/10/2015  . CAD S/P LAD and RCA DES  07/10/2015  . Presence of drug coated stent in LAD coronary artery   . STEMI 07/09/15   . UPPER RESPIRATORY INFECTION (URI) 06/30/2009  . LACERATION-FINGER 06/30/2009  . ERECTILE DYSFUNCTION 06/22/2008  . ACTINIC KERATOSIS 09/23/2007  . Depression with anxiety 06/03/2007  . INFECTION, URINARY TRACT NOS 06/03/2007  . SHOULDER PAIN 06/03/2007  . 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 GERD 03/07/2007  . 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 DIVERTICULITIS, HX OF 03/07/2007    Orientation RESPIRATION BLADDER Height & Weight     Self  O2 (2L ) Incontinent, External catheter Weight: 196 lb (88.9 kg) Height:  '5\' 10"'$  (177.8 cm)  BEHAVIORAL SYMPTOMS/MOOD NEUROLOGICAL BOWEL NUTRITION STATUS      Incontinent Feeding tube (placed 05/29/17)  AMBULATORY STATUS COMMUNICATION OF NEEDS Skin   Extensive Assist Verbally Normal                       Personal Care Assistance Level of Assistance  Bathing, Dressing, Feeding Bathing Assistance: Maximum assistance Feeding assistance: Maximum assistance Dressing Assistance: Maximum assistance     Functional Limitations Info  Speech     Speech Info: Impaired (aphasic)    SPECIAL CARE FACTORS FREQUENCY  PT (By licensed PT), OT (By licensed OT), Speech therapy     PT Frequency: 5/wk OT Frequency: 5/wk     Speech Therapy Frequency: 3/wk      Contractures  Additional Factors Info  Code Status, Allergies, Psychotropic Code Status Info: FULL Allergies Info: Coconut Fatty Acids Psychotropic Info: seroquel         Current Medications (05/30/2017):  This is the current hospital active medication list Current Facility-Administered Medications  Medication Dose Route Frequency Provider Last Rate Last Dose  . acetaminophen (TYLENOL) tablet 650 mg  650 mg Oral Q4H PRN Sampson Goon, MD       Or  . acetaminophen (TYLENOL) solution 650 mg  650 mg Per Tube Q4H PRN Sampson Goon, MD   650 mg at 05/26/17 0022   Or  .  acetaminophen (TYLENOL) suppository 650 mg  650 mg Rectal Q4H PRN Sampson Goon, MD   650 mg at 05/28/17 1616  . aspirin tablet 325 mg  325 mg Per Tube Daily Rinehuls, David L, PA-C   325 mg at 05/28/17 1245  . atorvastatin (LIPITOR) tablet 80 mg  80 mg Per Tube q1800 Omar Person, NP   80 mg at 05/29/17 1734  . bisacodyl (DULCOLAX) suppository 10 mg  10 mg Rectal Daily PRN Raylene Miyamoto, MD   10 mg at 05/24/17 0749  . chlorhexidine gluconate (MEDLINE KIT) (PERIDEX) 0.12 % solution 15 mL  15 mL Mouth Rinse BID Sampson Goon, MD   15 mL at 05/29/17 2128  . Chlorhexidine Gluconate Cloth 2 % PADS 6 each  6 each Topical Daily Rosalin Hawking, MD   6 each at 05/28/17 1700  . clopidogrel (PLAVIX) tablet 75 mg  75 mg Per Tube Daily Sampson Goon, MD   75 mg at 05/28/17 1245  . dexmedetomidine (PRECEDEX) 200 MCG/50ML (4 mcg/mL) infusion  0.4-1.2 mcg/kg/hr Intravenous Continuous Collene Gobble, MD   Stopped at 05/27/17 1820  . enoxaparin (LOVENOX) injection 40 mg  40 mg Subcutaneous Q24H Tawny Asal, MD   40 mg at 05/28/17 1100  . feeding supplement (JEVITY 1.2 CAL) liquid 1,000 mL  1,000 mL Per Tube Continuous Jill Alexanders, PA-C 50 mL/hr at 05/30/17 0443 1,000 mL at 05/30/17 0443  . feeding supplement (OSMOLITE 1.2 CAL) liquid 1,000 mL  1,000 mL Per Tube Continuous Raylene Miyamoto, MD      . feeding supplement (PRO-STAT SUGAR FREE 64) liquid 30 mL  30 mL Per Tube BID Collene Gobble, MD   30 mL at 31/49/70 2637  . folic acid (FOLVITE) tablet 1 mg  1 mg Oral Daily Tawny Asal, MD   1 mg at 05/25/17 1017  . haloperidol lactate (HALDOL) injection 5 mg  5 mg Intravenous Q4H PRN Hammonds, Sharyn Blitz, MD   5 mg at 05/29/17 2317  . insulin aspart (novoLOG) injection 0-15 Units  0-15 Units Subcutaneous Q4H Mauri Brooklyn, MD   2 Units at 05/30/17 0825  . MEDLINE mouth rinse  15 mL Mouth Rinse BID Raylene Miyamoto, MD   15 mL at 05/29/17 2230  . multivitamin (PROSIGHT) tablet 1 tablet  1  tablet Oral Daily Rosalin Hawking, MD   Stopped at 05/27/17 1000  . niacin tablet 500 mg  500 mg Oral Daily Allie Bossier, MD      . pantoprazole (PROTONIX) EC tablet 40 mg  40 mg Oral Daily Jill Alexanders, PA-C   40 mg at 05/29/17 1325  . potassium chloride 20 MEQ/15ML (10%) solution 20 mEq  20 mEq Per Tube TID Rinehuls, David L, PA-C      . QUEtiapine (SEROQUEL) tablet 50 mg  50 mg Oral  QHS Hammonds, Sharyn Blitz, MD   50 mg at 05/29/17 2126  . [place]   Oral PRN Raylene Miyamoto, MD      . senna-docusate (Senokot-S) tablet 1 tablet  1 tablet Oral QHS Sampson Goon, MD   1 tablet at 05/29/17 2126  . sodium chloride flush (NS) 0.9 % injection 10-40 mL  10-40 mL Intracatheter Q12H Rosalin Hawking, MD   10 mL at 05/29/17 2136  . sodium chloride flush (NS) 0.9 % injection 10-40 mL  10-40 mL Intracatheter PRN Rosalin Hawking, MD      . thiamine (VITAMIN B-1) tablet 100 mg  100 mg Oral Daily Tawny Asal, MD   100 mg at 05/27/17 1138     Discharge Medications: Please see discharge summary for a list of discharge medications.  Relevant Imaging Results:  Relevant Lab Results:   Additional Information SS#: [id number]  Jorge Ny, LCSW

## 2017-05-31 LAB — BLOOD GAS, ARTERIAL
Acid-Base Excess: 1.7 mmol/L (ref 0.0–2.0)
Bicarbonate: 25.3 mmol/L (ref 20.0–28.0)
DRAWN BY: 252031
O2 CONTENT: 3 L/min
O2 SAT: 91.4 %
PATIENT TEMPERATURE: 98.6
PO2 ART: 60.2 mmHg — AB (ref 83.0–108.0)
pCO2 arterial: 36.6 mmHg (ref 32.0–48.0)
pH, Arterial: 7.454 — ABNORMAL HIGH (ref 7.350–7.450)

## 2017-05-31 LAB — BASIC METABOLIC PANEL
Anion gap: 9 (ref 5–15)
BUN: 20 mg/dL (ref 6–20)
CALCIUM: 8.5 mg/dL — AB (ref 8.9–10.3)
CHLORIDE: 105 mmol/L (ref 101–111)
CO2: 23 mmol/L (ref 22–32)
CREATININE: 0.72 mg/dL (ref 0.61–1.24)
GFR calc non Af Amer: >60 mL/min (ref >60)
GLUCOSE: 144 mg/dL — AB (ref 65–99)
Potassium: 3.5 mmol/L (ref 3.5–5.1)
Sodium: 137 mmol/L (ref 135–145)

## 2017-05-31 LAB — SODIUM
SODIUM: 137 mmol/L (ref 135–145)
SODIUM: 138 mmol/L (ref 135–145)

## 2017-05-31 LAB — GLUCOSE, CAPILLARY
GLUCOSE-CAPILLARY: 130 mg/dL — AB (ref 65–99)
GLUCOSE-CAPILLARY: 178 mg/dL — AB (ref 65–99)
Glucose-Capillary: 133 mg/dL — ABNORMAL HIGH (ref 65–99)
Glucose-Capillary: 144 mg/dL — ABNORMAL HIGH (ref 65–99)
Glucose-Capillary: 126 mg/dL — ABNORMAL HIGH (ref 65–99)
Glucose-Capillary: 129 mg/dL — ABNORMAL HIGH (ref 65–99)

## 2017-05-31 LAB — MAGNESIUM: Magnesium: 1.9 mg/dL (ref 1.7–2.4)

## 2017-05-31 LAB — PHOSPHORUS: Phosphorus: 3 mg/dL (ref 2.5–4.6)

## 2017-05-31 NOTE — Progress Notes (Signed)
STROKE TEAM PROGRESS NOTE   SUBJECTIVE (INTERVAL HISTORY) No family members present. No events overnight. As per RN, no more agitation or combative behavior.  Awake alert this morning, still has right hemiplegia, right-sided neglect improved.  OBJECTIVE Temp:  [98.2 F (36.8 C)-100.9 F (38.3 C)] 100.9 F (38.3 C) (10/20 0736) Pulse Rate:  [81-102] 95 (10/20 0400) Cardiac Rhythm: Sinus tachycardia (10/20 0834) Resp:  [11-24] 15 (10/20 0400) BP: (105-143)/(67-82) 105/67 (10/20 0400) SpO2:  [88 %-94 %] 94 % (10/20 0300) FiO2 (%):  [2 %] 2 % (10/19 1200)  CBC:   Recent Labs Lab 05/27/17 0412 05/28/17 0534 05/30/17 1212  WBC 8.5 11.4* 9.7  NEUTROABS 5.7 8.4*  --   HGB 11.8* 13.5 14.2  HCT 35.4* 39.6 41.6  MCV 84.3 83.7 84.7  PLT 311 409* 432*    Basic Metabolic Panel:   Recent Labs Lab 05/30/17 0644  05/31/17 0052 05/31/17 0624  NA 137  < > 137 137  K 3.1*  --   --  3.5  CL 104  --   --  105  CO2 22  --   --  23  GLUCOSE 146*  --   --  144*  BUN 20  --   --  20  CREATININE 0.72  --   --  0.72  CALCIUM 8.7*  --   --  8.5*  MG 1.9  --   --  1.9  PHOS 3.1  --   --  3.0  < > = values in this interval not displayed.  Lipid Panel:     Component Value Date/Time   CHOL 186 05/17/2017 0500   CHOL 114 12/12/2016 1118   TRIG 532 (H) 05/17/2017 0500   HDL 26 (L) 05/17/2017 0500   HDL 27 (L) 12/12/2016 1118   CHOLHDL 7.2 05/17/2017 0500   VLDL UNABLE TO CALCULATE IF TRIGLYCERIDE OVER 400 mg/dL 16/10/960410/01/2017 [id number]   LDLCALC UNABLE TO CALCULATE IF TRIGLYCERIDE OVER 400 mg/dL 81/19/147810/01/2017 [id number]   LDLCALC 40 12/12/2016 1118   HgbA1c:  Lab Results  Component Value Date   HGBA1C 6.1 (H) 05/17/2017   Urine Drug Screen:     Component Value Date/Time   LABOPIA NONE DETECTED 05/16/2017 1902   COCAINSCRNUR POSITIVE (A) 05/16/2017 1902   LABBENZ POSITIVE (A) 05/16/2017 1902   AMPHETMU NONE DETECTED 05/16/2017 1902   THCU NONE DETECTED 05/16/2017 1902   LABBARB NONE DETECTED  05/16/2017 1902    Alcohol Level     Component Value Date/Time   ETH <10 05/16/2017 1535    IMAGING I have personally reviewed the radiological images below and agree with the radiology interpretations.  [name] 05/21/2017 1. Large evolving acute ischemic infarct involving essentially the entirety of the left MCA territory, similar to previous exams. Associated petechial hemorrhage without frank hemorrhagic transformation, most notable at the left lentiform nucleus. 2. Associated mass effect with 12 mm of left-to-right midline shift, worsened relative to most recent CT from 05/18/2017. No hydrocephalus or ventricular trapping.  Mr Maxine GlennMra Head Wo Contrast 05/21/2017 1. Occlusion of the left ICA at the level of the terminus with absent perfusion within the left middle cerebral artery. 2. Patent anterior communicating artery with perfusion of both anterior cerebral arteries, and minimal patchy retrograde flow within the left A1 segment. 3. Widely patent right middle cerebral artery and posterior Circulation.  Ct Head Wo Contrast 05/16/2017 IMPRESSION:  1. Large left MCA nonhemorrhagic infarct with minimal sparing of super ganglionic cortex.  2. Mass effect with effacement of the sulci and partial effacement of the left lateral ventricle.  3. ASPECTS is 10/10   05/18/2017 Unchanged appearance of cytotoxic edema throughout the left MCA territory with unchanged rightward midline shift measuring 4 mm. No hemorrhage. No ventricular entrapment or hydrocephalus.  05/17/2017 Large acute infarct in the distribution of the left middle cerebral artery.  Since the previous study, there is increasing mass effect with increased effacement of the left lateral ventricle.  Mild developing left-to-right midline shift of about 4 mm.  No acute intracranial hemorrhage.    05/24/2017   Rightward midline shift now 14 mm (increased from 12 mm on 05/21/2017). 2. Petechial hemorrhage as seen by  MRI, but no malignant hemorrhagic transformation. 3. No new intracranial abnormality. Electronically Signed: By: Odessa Fleming M.D. On: 05/24/2017 17:28   Ct Cerebral Perfusion W Contrast 05/16/2017 IMPRESSION:  Large left MCA territory nonhemorrhagic infarct neck exceeds size criteria for intra-arterial intervention.  Ct Angio Head W Or Wo Contrast Ct Angio Neck W Or Wo Contrast 05/16/2017 IMPRESSION:  1. 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  2. No significant collateral flow to the left MCA territory.  3. Patent anterior communicating artery with retrograde filling of the left A1 segment.  4. Minimal atherosclerotic calcification within the cavernous right internal carotid artery.  5. No other significant atherosclerotic disease within the head or neck.  6. Degenerative changes of the cervical spine are most pronounced at C6-7.   Dg Chest Port 1 View 05/19/2017 Stable hypoinflation.  No pneumonia nor significant pulmonary edema. The support tubes are in reasonable position.  05/16/2017 1. Low lung volumes.  2. Satisfactory positioning of support apparatus as above.   05/23/17 1. Low lung volumes. 2. Mild pulmonary vascular congestion and small effusions may reflect neurogenic edema versus cardiac. 3. Minimal bibasilar airspace disease likely reflects atelectasis.  Dg Chest Port 1 View 05/25/2017 IMPRESSION: Stable support apparatus. Hypoinflation of the lungs with mild bibasilar subsegmental atelectasis.   Transthoracic Echocardiogram 05/17/2017 LVEF 45-50% Akinesis of the mid-apical anteroseptal, apical inferoseptal, and apical myocardium. Hypokinesis of the apical inferior myocardium.    PHYSICAL EXAM Vitals:   05/31/17 0200 05/31/17 0300 05/31/17 0400 05/31/17 0736  BP:  105/67 105/67   Pulse: 82 96 95   Resp: Temp:  98.2 F (36.8 C)  (!) 100.9 F (38.3 C)  TempSrc:  Oral  Axillary   SpO2: 94% 94%    Weight:      Height:        Physical exam: General -well-nourished, well-developed, not in acute distress  Mental Status -awake, alert, global aphasia, answer questions or follow commands.  Attending to both sides now, right-sided neglect much improved.  Cranial Nerves II - XII - II - blinking to visual threat bilaterally III, IV, VI - mild left gaze preference but able to cross midline, and attending to the right side. VII - Right facial droop XII - tongue midline  Motor Strength - Left sided movements spontaneous at least 4/5 strength, right UE slight withdraw on pain, RLE 2/5 with pain stimulation   Motor Tone - mildly increased muscle tone on RUE and RLE, normal LLE and LUE  Reflexes - Positive babinski on R  Sensation, coordination and gait not able to test.   ASSESSMENT/PLAN [name] is a 56 y.o. male with history of anxiety, depression, cocaine use, history of  apical mural thrombus, hypertension, hyperlipidemia and coronary artery disease, presenting with aphasia and right sided weakness. He did not receive IV t-PA due to late presentation.  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 Mild developing left-to-right midline shift of about 4 mm.   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  MRI head - left malignant MCA infarct with midline shift about 12mm  MRA head - left ICA terminus persistent occlusion, no left MCA visualized   2D Echo - LVEF 45% to 50%  LDL - 26  HgbA1c - 6.1  Lovenox for VTE prophylaxis  NPO on TF @ 70  aspirin 81 mg  daily and clopidogrel 75 mg daily prior to admission, now on ASA 325mg  and clopidogrel 75mg .  Ongoing aggressive stroke risk factor management  Therapy recommendations:  - SNF  Disposition:  Pending  Cerebral Edema  CT and MRI showed midline shift up to 41mm  Hypertonic saline - weaned off 05/23/17  However, CT repeat 05/24/17 showed progressive midline shift  CT repeat 05/26/17 - decreasing mass effect and edema and midline shift  off 3% again  Na - 137->138  Hx of apical thrombus  12/2015 TTE showed an apical thrombus on definity evaluation, EF 40-45%  Started on coumadin in 12/2015  Repeat TTE 06/2016 EF 40-45% no evidence of thrombus  Coumadin stopped on 07/2016  Delirium  Pt agitated and encephalopathic 05/22/17  Concerning for substance vs. Alcohol withdraw  off precedex  On FA/B1/MVI  Stable now  Hypertension  Stable, BP at low side  Gradually decrease BP to goal   Long-term BP goal 130-150 due to left ICA occlusion - currently running low.  Hyperlipidemia  Home meds:  Lipitor 80 mg daily - resumed in hospital  LDL 26, goal < 70  Continue statin at discharge  Cocaine abuse  UDS positive for cocaine  Cessation counseling will be provided  Dysphagia   Had cortrak placed  Continue TF @ 70  Passed swallow, on antiseizure liquid  Other Stroke Risk Factors  Former cigarette smoker   ETOH use, will be advised to drink no more than 1 drink per day.  Coronary artery disease s/p stent on DAPT  Other Active Problems  Hypokalemia -> replacing -> 3.1 -> supplement further -> 3.5  CXR stable  Fever - 100.9 axillary Saturday - blood and urine cultures pending. CXR yesterday unremarkable.   Hospital day # 15  Neurology will sign off. Please call with questions. Pt will follow up with Dr. Pearlean Brownie at [facility] in about 6 weeks. Thanks for the consult.  Marvel Plan, MD PhD Stroke Neurology 05/31/2017 2:38 PM  To contact Stroke Continuity  provider, please refer to [email or web address]. After hours, contact General Neurology

## 2017-05-31 NOTE — Progress Notes (Signed)
PROGRESS NOTE    Henry David  RCV:[id number] DOB: 1961/01/27 DOA: 05/16/2017 PCP: Arnoldo Morale, MD   Brief Narrative:  56 year old WM PMHx Anxiety; Depression,PTSD, Apical mural thrombus; STEMI,HTN,CAD, HLD, BPH (benign prostatic hyperplasia);  Diverticulitis;   Found down earlier today. He was last seen normal at 2 AM and it is 1816 at time of this dictation. CT scan of the head is already showing a large area of MCA territory infarction. 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 He is unable to provide any history and on arrival in the intensive care unit his respirations were intermittently sonorous and I have intubated him for airway protection.    Subjective: 10/20 MAXIMUM TEMPERATURE last 24 hours 38.3C. patient alert follows some commands. First day he's tried to actually speak.     Assessment & Plan:   Active Problems:   Acute ischemic stroke (HCC)   Cerebral edema (HCC)   Brain herniation (HCC)   Encounter for central line care   Encounter for orogastric (OG) tube placement   Ventilator dependent ([place])   Acute respiratory failure (HCC)   On mechanically assisted ventilation (HCC)   Cocaine abuse (HCC)   Dysphagia, post-stroke   Right hemiparesis (HCC)   Benign essential HTN   Coronary artery disease involving native coronary artery of native heart without angina pectoris   Agitation   Hypokalemia   Hypernatremia   Acute blood loss anemia  Acute CVA -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 -secondary to Cocaine abuse,EtOH abuse and Essential HTN. -Severe encephalopathy, waxing and waning. Minimally follows commands today. Difficult to arouse most likely secondary to not sleeping well during the night and administration of Haldol. If continues will consider repeat MRI: Extension CVA (possible but unlikely)  -stroke team consulted -PT/OT: Recommended CIR. Unsure patient  will be able participate at expected level secondary to his residual neurologic deficits.  Depression/ Anxiety. -Unable to evaluate secondary to patient's ongoing deficits  Chronic Systolic CHF -Strict in and out since admission -2.3 L -Daily weight Filed Weights   05/29/17 1200 05/30/17 0500 05/31/17 1238  Weight: 198 lb 3.1 oz (89.9 kg) 196 lb (88.9 kg) 196 lb 6.4 oz (89.1 kg)  -Metoprolol 12.5 mg BID -Transfusion hemoglobin< 8  Essential HTN -See CHF.  CAD -See CHF  OSA? -Continue to hold CPAP   -ABG 10/20: Normal   DM Type 2 controlled with complications -10/2 Hemoglobin A1c= 6.1  Hypertriglyceridemia/HLD -Lipitor 80 mg daily -Niacin 500 mg daily  Hyponatremia -Resolved  Hypokalemia. -potassium goal> 4 -Potassium 20 mEq TID   Dysphagia -Dysphagia 1, honey thick liquid  FUO -Patient continues to spike fevers. Negative leukocytosis, negative bands negative left shift reactive? -1/2blood cultures positive coag negative staph most likely contaminant. Will not treat       DVT prophylaxis: Lovenox Code Status: Full Family Communication: none Disposition Plan: CIR?   Consultants:  Stroke team [facility] M    Procedures/Significant Events:  10/5 - Presents to ED with large Left MCA CVA  CT Head 10/5 > Large left MCA nonhemorrhagic infarct with minimal sparing of super ganglionic cortex. Mass effect with effacement of the sulci and partial effacement of the left lateral ventricle. 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 10/6 - 3% NS 10/6 Echocardiogram > LVEF 45-50%, Akinesis of the mid-apical anteroseptal, apical  inferoseptal,  and apical myocardium. Hypokinesis of the apical inferior myocardium. - no PFO or mural thrombus   CT Head 10/7> L MCA cytotoxic edema with 4 mm midline shift  10/9 Extubated  10/10 enceph bad, precedex   MRI/MRA Head 10/10 > Evolving L MCA  territory infarct with worsened edema, 12 mm midline shift, Occlusion of L ICA CT head 10/13>>> 1. Continued increased intracranial mass effect from the large left MCA territory infarct. Rightward midline shift now 14 mm (increased from 12 mm on 05/21/2017). 2. Petechial hemorrhage as seen by MRI, but no malignant hemorrhagic transformation   EEG 05/26/2017>> findings indicates bilateral cerebral dysfunction that is non-specific in etiology and can be seen with hypoxic/ischemic injury, toxic/metabolic encephalopathies, or medication effect.  The absence of epileptiform discharges does not rule out a clinical diagnosis of epilepsy.  Clinical correlation is advised. 10/16- Slow wean of 3% Saline per neuro started 9 am ( D/C 10/17 at 9 am), failed SLP 10/18 PEG tube placement 10/19 PCXR pending   I have personally reviewed and interpreted all radiology studies and my findings are as above.  VENTILATOR SETTINGS: none   Cultures MRSA PCR 10/5 > Negative  10/19 blood 1/2 positive coag negative staph (most likely contaminant) 10/19 urine pending    Antimicrobials: Anti-infectives    Start     Stop   05/29/17 1100  ceFAZolin (ANCEF) IVPB 2g/100 mL premix     05/29/17 1103       Devices    LINES / TUBES:  ETT 10/5 >> 10/9 Right Subclavian CVC 10/5 >> 10/12 R IJ CVC 10/13 >>      Continuous Infusions: . dexmedetomidine (PRECEDEX) IV infusion Stopped (05/27/17 1820)  . feeding supplement (JEVITY 1.2 CAL) 1,000 mL (05/31/17 1400)  . feeding supplement (OSMOLITE 1.2 CAL)       Objective: Vitals:   05/31/17 1040 05/31/17 1238 05/31/17 1350 05/31/17 1450  BP: 128/74  140/78   Pulse: (!) 103  92   Resp:   19   Temp:  98.4 F (36.9 C)  97.7 F (36.5 C)  TempSrc:  Oral  Oral  SpO2: 96%  96%   Weight:  196 lb 6.4 oz (89.1 kg)    Height:        Intake/Output Summary (Last 24 hours) at 05/31/17 1619 Last data filed at 05/31/17 1238  Gross per 24 hour  Intake            1793.5 ml  Output             1560 ml  Net            233.5 ml   Filed Weights   05/29/17 1200 05/30/17 0500 05/31/17 1238  Weight: 198 lb 3.1 oz (89.9 kg) 196 lb (88.9 kg) 196 lb 6.4 oz (89.1 kg)    Physical Exam:  General: alert, follows some commands, attempts to speak today,No acute respiratory distress Neck:  Negative scars, masses, torticollis, lymphadenopathy, JVD Lungs: Clear to auscultation bilaterally without wheezes or crackles Cardiovascular: Regular rate and rhythm without murmur gallop or rub normal S1 and S2 Abdomen: negative abdominal pain, nondistended, positive soft, bowel sounds, no rebound, no ascites, no appreciable mass, PEG tube present covered no sign of infection Extremities: No significant cyanosis, clubbing, or edema bilateral lower extremities Skin: Negative rashes, lesions, ulcers Psychiatric:  Unable to evaluate secondary CVA Central nervous system:  Follow some commands, right hemiparesis, nonverbal (did attempt to speak today), positive dysarthria, positive expressive aphasia, negative  receptive aphasia. .     Data Reviewed: Care during the described time interval was provided by me .  I have reviewed this patient's available data, including medical history, events of note, physical examination, and all test results as part of my evaluation.   CBC:  Recent Labs Lab 05/25/17 0315 05/26/17 0314 05/27/17 0412 05/28/17 0534 05/30/17 1212  WBC 8.7 9.1 8.5 11.4* 9.7  NEUTROABS  --   --  5.7 8.4*  --   HGB 12.3* 12.1* 11.8* 13.5 14.2  HCT 36.3* 36.6* 35.4* 39.6 41.6  MCV 83.8 84.3 84.3 83.7 84.7  PLT 264 283 311 409* 510*   Basic Metabolic Panel:  Recent Labs Lab 05/27/17 0412  05/28/17 0534  05/28/17 2029 05/28/17 2345 05/29/17 0906  05/30/17 0644 05/30/17 1212 05/30/17 1939 05/31/17 0052 05/31/17 0624 05/31/17 1237  NA 145  < > 141  < > 135 133* 137  < > 137 138 139 137 137 138  K 3.3*  --  2.9*  --  3.0* 3.2* 3.0*  --  3.1*  --    --   --  3.5  --   CL 118*  --  110  --  104 104 105  --  104  --   --   --  105  --   CO2 22  --  21*  --  20* 21* 21*  --  22  --   --   --  23  --   GLUCOSE 111*  --  100*  --  116* 121* 102*  --  146*  --   --   --  144*  --   BUN 12  --  12  --  _0 --  20  --   --   --  20  --   CREATININE 0.67  --  0.72  --  0.70 0.68 0.81  --  0.72  --   --   --  0.72  --   CALCIUM 8.0*  --  8.3*  --  8.3* 8.2* 8.5*  --  8.7*  --   --   --  8.5*  --   MG  --   --  2.1  --   --  2.0 2.0  --  1.9  --   --   --  1.9  --   PHOS 3.5  --  3.1  --   --  3.3  --   --  3.1  --   --   --  3.0  --   < > = values in this interval not displayed. GFR: Estimated Creatinine Clearance: 113 mL/min (by C-G formula based on SCr of 0.72 mg/dL). Liver Function Tests: No results for input(s): AST, ALT, ALKPHOS, BILITOT, PROT, ALBUMIN in the last 168 hours. No results for input(s): LIPASE, AMYLASE in the last 168 hours. No results for input(s): AMMONIA in the last 168 hours. Coagulation Profile: No results for input(s): INR, PROTIME in the last 168 hours. Cardiac Enzymes: No results for input(s): CKTOTAL, CKMB, CKMBINDEX, TROPONINI in the last 168 hours. BNP (last 3 results) No results for input(s): PROBNP in the last 8760 hours. HbA1C: No results for input(s): HGBA1C in the last 72 hours. CBG:  Recent Labs Lab 05/30/17 2309 05/31/17 0355 05/31/17 0738 05/31/17 1233 05/31/17 1505  GLUCAP 163* 133* 130* 144* 126*   Lipid Profile: No results for input(s): CHOL, HDL, LDLCALC, TRIG, CHOLHDL, LDLDIRECT in the  last 72 hours. Thyroid Function Tests: No results for input(s): TSH, T4TOTAL, FREET4, T3FREE, THYROIDAB in the last 72 hours. Anemia Panel: No results for input(s): VITAMINB12, FOLATE, FERRITIN, TIBC, IRON, RETICCTPCT in the last 72 hours. Urine analysis:    Component Value Date/Time   COLORURINE YELLOW 05/20/2017 0948   APPEARANCEUR CLEAR 05/20/2017 0948   LABSPEC 1.021 05/20/2017 0948   PHURINE  7.0 05/20/2017 0948   GLUCOSEU NEGATIVE 05/20/2017 0948   GLUCOSEU NEGATIVE 06/20/2008 1444   HGBUR NEGATIVE 05/20/2017 0948   BILIRUBINUR NEGATIVE 05/20/2017 0948   KETONESUR NEGATIVE 05/20/2017 0948   PROTEINUR NEGATIVE 05/20/2017 0948   UROBILINOGEN 0.2 mg/dL 06/20/2008 1444   NITRITE NEGATIVE 05/20/2017 0948   LEUKOCYTESUR NEGATIVE 05/20/2017 0948   Sepsis Labs: _0 (procalcitonin:4,lacticidven:4)  ) Recent Results (from the past 240 hour(s))  Culture, blood (routine x 2)     Status: None (Preliminary result)   Collection Time: 05/30/17  2:55 PM  Result Value Ref Range Status   Specimen Description BLOOD LEFT FOREARM  Final   Special Requests   Final    BOTTLES DRAWN AEROBIC AND ANAEROBIC Blood Culture adequate volume   Culture NO GROWTH < 24 HOURS  Final   Report Status PENDING  Incomplete  Culture, blood (routine x 2)     Status: None (Preliminary result)   Collection Time: 05/30/17  3:03 PM  Result Value Ref Range Status   Specimen Description BLOOD LEFT FOREARM  Final   Special Requests   Final    BOTTLES DRAWN AEROBIC AND ANAEROBIC Blood Culture adequate volume   Culture NO GROWTH < 24 HOURS  Final   Report Status PENDING  Incomplete         Radiology Studies: Dg Chest Port 1 View  Result Date: 05/30/2017 CLINICAL DATA:  Fevers of unknown origin EXAM: PORTABLE CHEST 1 VIEW COMPARISON:  05/28/2017 FINDINGS: Cardiac shadow is stable. Right jugular line is been removed. The overall inspiratory effort is poor with some crowding of the vascular markings. No focal infiltrate or sizable effusion is seen. No bony abnormality is noted. IMPRESSION: Poor inspiratory effort without acute abnormality. Electronically Signed   By: Inez Catalina M.D.   On: 05/30/2017 14:54        Scheduled Meds: . aspirin  325 mg Per Tube Daily  . atorvastatin  80 mg Per Tube q1800  . chlorhexidine gluconate (MEDLINE KIT)  15 mL Mouth Rinse BID  . Chlorhexidine Gluconate Cloth  6  each Topical Daily  . clopidogrel  75 mg Per Tube Daily  . enoxaparin (LOVENOX) injection  40 mg Subcutaneous Q24H  . feeding supplement (PRO-STAT SUGAR FREE 64)  30 mL Per Tube BID  . folic acid  1 mg Oral Daily  . insulin aspart  0-15 Units Subcutaneous Q4H  . mouth rinse  15 mL Mouth Rinse BID  . metoprolol tartrate  12.5 mg Oral BID  . multivitamin  1 tablet Oral Daily  . niacin  500 mg Oral Daily  . pantoprazole  40 mg Oral Daily  . potassium chloride  20 mEq Per Tube TID  . QUEtiapine  50 mg Oral QHS  . senna-docusate  1 tablet Oral QHS  . sodium chloride flush  10-40 mL Intracatheter Q12H  . thiamine  100 mg Oral Daily   Continuous Infusions: . dexmedetomidine (PRECEDEX) IV infusion Stopped (05/27/17 1820)  . feeding supplement (JEVITY 1.2 CAL) 1,000 mL (05/31/17 1400)  . feeding supplement (OSMOLITE 1.2 CAL)  LOS: 15 days    Time spent: 40 minutes    WOODS, Geraldo Docker, MD [facility] Pager [phone]   If 7PM-7AM, please contact night-coverage [email or web address] Password [email or web address] 05/31/2017, 4:19 PM

## 2017-06-01 DIAGNOSIS — N390 Urinary tract infection, site not specified: Secondary | ICD-10-CM

## 2017-06-01 DIAGNOSIS — B9629 Other Escherichia coli [E. coli] as the cause of diseases classified elsewhere: Secondary | ICD-10-CM

## 2017-06-01 DIAGNOSIS — Z1612 Extended spectrum beta lactamase (ESBL) resistance: Secondary | ICD-10-CM

## 2017-06-01 DIAGNOSIS — F1910 Other psychoactive substance abuse, uncomplicated: Secondary | ICD-10-CM

## 2017-06-01 DIAGNOSIS — E1165 Type 2 diabetes mellitus with hyperglycemia: Secondary | ICD-10-CM

## 2017-06-01 LAB — CBC
HCT: 39.5 % (ref 39.0–52.0)
HEMOGLOBIN: 13.1 g/dL (ref 13.0–17.0)
MCH: 28.5 pg (ref 26.0–34.0)
MCHC: 33.2 g/dL (ref 30.0–36.0)
MCV: 85.9 fL (ref 78.0–100.0)
Platelets: 428 10*3/uL — ABNORMAL HIGH (ref 150–400)
RBC: 4.6 MIL/uL (ref 4.22–5.81)
RDW: 13.9 % (ref 11.5–15.5)
WBC: 8.4 10*3/uL (ref 4.0–10.5)

## 2017-06-01 LAB — BLOOD CULTURE ID PANEL (REFLEXED)
Acinetobacter baumannii: NOT DETECTED
CANDIDA ALBICANS: NOT DETECTED
CANDIDA TROPICALIS: NOT DETECTED
Candida glabrata: NOT DETECTED
Candida krusei: NOT DETECTED
Candida parapsilosis: NOT DETECTED
ENTEROBACTER CLOACAE COMPLEX: NOT DETECTED
ESCHERICHIA COLI: NOT DETECTED
Enterobacteriaceae species: NOT DETECTED
Enterococcus species: NOT DETECTED
Haemophilus influenzae: NOT DETECTED
KLEBSIELLA OXYTOCA: NOT DETECTED
KLEBSIELLA PNEUMONIAE: NOT DETECTED
Listeria monocytogenes: NOT DETECTED
METHICILLIN RESISTANCE: NOT DETECTED
Neisseria meningitidis: NOT DETECTED
PROTEUS SPECIES: NOT DETECTED
PSEUDOMONAS AERUGINOSA: NOT DETECTED
SERRATIA MARCESCENS: NOT DETECTED
STREPTOCOCCUS PNEUMONIAE: NOT DETECTED
STREPTOCOCCUS PYOGENES: NOT DETECTED
STREPTOCOCCUS SPECIES: NOT DETECTED
Staphylococcus aureus (BCID): NOT DETECTED
Staphylococcus species: DETECTED — AB
Streptococcus agalactiae: NOT DETECTED

## 2017-06-01 LAB — BASIC METABOLIC PANEL
Anion gap: 9 (ref 5–15)
BUN: 18 mg/dL (ref 6–20)
CHLORIDE: 104 mmol/L (ref 101–111)
CO2: 25 mmol/L (ref 22–32)
Calcium: 8.7 mg/dL — ABNORMAL LOW (ref 8.9–10.3)
Creatinine, Ser: 0.67 mg/dL (ref 0.61–1.24)
GFR calc non Af Amer: >60 mL/min (ref >60)
Glucose, Bld: 161 mg/dL — ABNORMAL HIGH (ref 65–99)
POTASSIUM: 3.7 mmol/L (ref 3.5–5.1)
SODIUM: 138 mmol/L (ref 135–145)

## 2017-06-01 LAB — PHOSPHORUS: Phosphorus: 3.5 mg/dL (ref 2.5–4.6)

## 2017-06-01 LAB — GLUCOSE, CAPILLARY
GLUCOSE-CAPILLARY: 119 mg/dL — AB (ref 65–99)
GLUCOSE-CAPILLARY: 144 mg/dL — AB (ref 65–99)
Glucose-Capillary: 153 mg/dL — ABNORMAL HIGH (ref 65–99)
Glucose-Capillary: 125 mg/dL — ABNORMAL HIGH (ref 65–99)
Glucose-Capillary: 141 mg/dL — ABNORMAL HIGH (ref 65–99)
Glucose-Capillary: 135 mg/dL — ABNORMAL HIGH (ref 65–99)
Glucose-Capillary: 133 mg/dL — ABNORMAL HIGH (ref 65–99)

## 2017-06-01 LAB — MAGNESIUM: Magnesium: 1.9 mg/dL (ref 1.7–2.4)

## 2017-06-01 MED ORDER — DEXTROSE 5 % IV SOLN
1.0000 g | INTRAVENOUS | Status: DC
  Administered 2017-06-01 – 2017-06-02 (×2): 1 g via INTRAVENOUS
  Filled 2017-06-01 (×3): qty 10

## 2017-06-01 NOTE — Progress Notes (Signed)
PROGRESS NOTE    Henry David  WUJ:[id number] DOB: 11/28/1960 DOA: 05/16/2017 PCP: Arnoldo Morale, MD   Brief Narrative:  56 year old WM PMHx Anxiety; Depression,PTSD, Apical mural thrombus; STEMI,HTN,CAD, HLD, BPH (benign prostatic hyperplasia);  Diverticulitis;   Found down earlier today. He was last seen normal at 2 AM and it is 1816 at time of this dictation. CT scan of the head is already showing a large area of MCA territory infarction. 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 He is unable to provide any history and on arrival in the intensive care unit his respirations were intermittently sonorous and I have intubated him for airway protection.    Subjective: 10/21 alert follows commands attempted to speak.not just noted questions. Negative CP, negative SOB, negative abdominal pain. Last 24 hours afebrile        Assessment & Plan:   Active Problems:   Acute ischemic stroke (HCC)   Cerebral edema (HCC)   Brain herniation (HCC)   Encounter for central line care   Encounter for orogastric (OG) tube placement   Ventilator dependent ([place])   Acute respiratory failure (HCC)   On mechanically assisted ventilation (HCC)   Cocaine abuse (HCC)   Dysphagia, post-stroke   Right hemiparesis (HCC)   Benign essential HTN   Coronary artery disease involving native coronary artery of native heart without angina pectoris   Agitation   Hypokalemia   Hypernatremia   Acute blood loss anemia  Acute CVA -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 -secondary to Cocaine abuse,EtOH abuse and Essential HTN. -Severe encephalopathy, waxing and waning. Minimally follows commands today. Difficult to arouse most likely secondary to not sleeping well during the night and administration of Haldol. If continues will consider repeat MRI: Extension CVA (possible but unlikely)  -stroke team consulted -PT/OT:  Recommended CIR. Unsure patient will be able participate at expected level secondary to his residual neurologic deficits.  Substance abuse -10/5 UDS positive for Cocaine  Depression/ Anxiety. -Unable to evaluate secondary to patient's ongoing deficits  Chronic Systolic CHF -Strict in and out since admission -3.9 L -Daily weight Filed Weights   05/30/17 0500 05/31/17 1238 06/01/17 0305  Weight: 196 lb (88.9 kg) 196 lb 6.4 oz (89.1 kg) 196 lb 14.4 oz (89.3 kg)  -Metoprolol 12.5 mg BID -Transfusion hemoglobin< 8  Essential HTN -See CHF.  CAD -See CHF  OSA? -Continue to hold CPAP   -ABG 10/20: Normal   DM Type 2 controlled with complications -95/6 Hemoglobin A1c= 6.1  Hypertriglyceridemia/HLD -Lipitor 80 mg daily -niacin 500 mg daily  Hyponatremia -Resolved  Hypokalemia. -potassium goal> 4 -potassium 20 mEq TID   Dysphagia -Dysphagia 1, honey thick liquid  UTI positive Escherichia coli -DC Foley, place condom cath -Ceftriaxone per pharmacy 5 days       DVT prophylaxis: Lovenox Code Status: Full Family Communication: none Disposition Plan: CIR?   Consultants:  Stroke team [facility]    Procedures/Significant Events:  10/5 - Presents to ED with large Left MCA CVA  CT Head 10/5 > Large left MCA nonhemorrhagic infarct with minimal sparing of super ganglionic cortex. Mass effect with effacement of the sulci and partial effacement of the left lateral ventricle. 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 10/6 - 3% NS 10/6 Echocardiogram > LVEF 45-50%, Akinesis of the mid-apical anteroseptal, apical  inferoseptal, and apical myocardium. Hypokinesis of the apical inferior myocardium. - no PFO or mural thrombus   CT Head 10/7> L MCA cytotoxic edema with 4 mm midline shift  10/9 Extubated  10/10 enceph bad, precedex   MRI/MRA Head 10/10 > Evolving L MCA territory infarct with  worsened edema, 12 mm midline shift, Occlusion of L ICA CT head 10/13>>> 1. Continued increased intracranial mass effect from the large left MCA territory infarct. Rightward midline shift now 14 mm (increased from 12 mm on 05/21/2017). 2. Petechial hemorrhage as seen by MRI, but no malignant hemorrhagic transformation   EEG 05/26/2017>> findings indicates bilateral cerebral dysfunction that is non-specific in etiology and can be seen with hypoxic/ischemic injury, toxic/metabolic encephalopathies, or medication effect.  The absence of epileptiform discharges does not rule out a clinical diagnosis of epilepsy.  Clinical correlation is advised. 10/16- Slow wean of 3% Saline per neuro started 9 am ( D/C 10/17 at 9 am), failed SLP 10/18 PEG tube placement 10/19 PCXR pending   I have personally reviewed and interpreted all radiology studies and my findings are as above.  VENTILATOR SETTINGS: none   Cultures MRSA PCR 10/5 > Negative  10/19 blood 1/2 positive coag negative staph (most likely contaminant) 10/19 urine positive Escherichia coli    Antimicrobials: Anti-infectives    Start     Stop   05/29/17 1100  ceFAZolin (ANCEF) IVPB 2g/100 mL premix     05/29/17 1103       Devices    LINES / TUBES:  ETT 10/5 >> 10/9 Right Subclavian CVC 10/5 >> 10/12 R IJ CVC 10/13 >>      Continuous Infusions: . dexmedetomidine (PRECEDEX) IV infusion Stopped (05/27/17 1820)  . feeding supplement (JEVITY 1.2 CAL) 1,000 mL (06/01/17 0317)     Objective: Vitals:   05/31/17 2300 05/31/17 2319 06/01/17 0300 06/01/17 0305  BP:  131/79 127/65   Pulse:  92 89   Resp:  16 18   Temp: 98 F (36.7 C)  97.9 F (36.6 C)   TempSrc: Oral  Oral   SpO2:  98% 96%   Weight:    196 lb 14.4 oz (89.3 kg)  Height:        Intake/Output Summary (Last 24 hours) at 06/01/17 0757 Last data filed at 06/01/17 0000  Gross per 24 hour  Intake             1338 ml  Output              500 ml  Net               838 ml   Filed Weights   05/30/17 0500 05/31/17 1238 06/01/17 0305  Weight: 196 lb (88.9 kg) 196 lb 6.4 oz (89.1 kg) 196 lb 14.4 oz (89.3 kg)     Physical Exam:  General: alert follows commands attempted to speak,No acute respiratory distress Neck:  Negative scars, masses, torticollis, lymphadenopathy, JVD Lungs: Clear to auscultation bilaterally without wheezes or crackles Cardiovascular: Regular rate and rhythm without murmur gallop or rub normal S1 and S2 Abdomen: negative abdominal pain, nondistended, positive soft, bowel sounds, no rebound, no ascites, no appreciable mass, PEG tube present covered no sign of infection Extremities: No significant cyanosis, clubbing, or edema bilateral lower extremities Skin: Negative rashes, lesions, ulcers Psychiatric:  Unable to evaluate secondary CVA Central nervous system:  Follow some commands, right hemiparesis, nonverbal (did attempt to speak today), positive dysarthria, positive expressive aphasia, negative receptive aphasia.  Data Reviewed: Care during the described time interval was provided by me .  I have reviewed this patient's available data, including medical history, events of note, physical examination, and all test results as part of my evaluation.   CBC:  Recent Labs Lab 05/26/17 0314 05/27/17 0412 05/28/17 0534 05/30/17 1212 06/01/17 0546  WBC 9.1 8.5 11.4* 9.7 8.4  NEUTROABS  --  5.7 8.4*  --   --   HGB 12.1* 11.8* 13.5 14.2 13.1  HCT 36.6* 35.4* 39.6 41.6 39.5  MCV 84.3 84.3 83.7 84.7 85.9  PLT 283 311 409* 432* 270*   Basic Metabolic Panel:  Recent Labs Lab 05/28/17 0534  05/28/17 2345 05/29/17 0906  05/30/17 0644  05/30/17 1939 05/31/17 0052 05/31/17 0624 05/31/17 1237 06/01/17 0546  NA 141  < > 133* 137  < > 137  < > 139 137 137 138 138  K 2.9*  < > 3.2* 3.0*  --  3.1*  --   --   --  3.5  --  3.7  CL 110  < > 104 105  --  104  --   --   --  105  --  104  CO2 21*  < > 21* 21*  --  22  --   --    --  23  --  25  GLUCOSE 100*  < > 121* 102*  --  146*  --   --   --  144*  --  161*  BUN 12  < > 13 16  --  20  --   --   --  20  --  18  CREATININE 0.72  < > 0.68 0.81  --  0.72  --   --   --  0.72  --  0.67  CALCIUM 8.3*  < > 8.2* 8.5*  --  8.7*  --   --   --  8.5*  --  8.7*  MG 2.1  --  2.0 2.0  --  1.9  --   --   --  1.9  --  1.9  PHOS 3.1  --  3.3  --   --  3.1  --   --   --  3.0  --  3.5  < > = values in this interval not displayed. GFR: Estimated Creatinine Clearance: 113.2 mL/min (by C-G formula based on SCr of 0.67 mg/dL). Liver Function Tests: No results for input(s): AST, ALT, ALKPHOS, BILITOT, PROT, ALBUMIN in the last 168 hours. No results for input(s): LIPASE, AMYLASE in the last 168 hours. No results for input(s): AMMONIA in the last 168 hours. Coagulation Profile: No results for input(s): INR, PROTIME in the last 168 hours. Cardiac Enzymes: No results for input(s): CKTOTAL, CKMB, CKMBINDEX, TROPONINI in the last 168 hours. BNP (last 3 results) No results for input(s): PROBNP in the last 8760 hours. HbA1C: No results for input(s): HGBA1C in the last 72 hours. CBG:  Recent Labs Lab 05/31/17 1233 05/31/17 1505 05/31/17 1803 05/31/17 2122 06/01/17 0436  GLUCAP 144* 126* 178* 129* 125*   Lipid Profile: No results for input(s): CHOL, HDL, LDLCALC, TRIG, CHOLHDL, LDLDIRECT in the last 72 hours. Thyroid Function Tests: No results for input(s): TSH, T4TOTAL, FREET4, T3FREE, THYROIDAB in the last 72 hours. Anemia Panel: No results for input(s): VITAMINB12, FOLATE, FERRITIN, TIBC, IRON, RETICCTPCT in the last 72 hours. Urine analysis:    Component Value Date/Time   COLORURINE YELLOW 05/20/2017 [place]  05/20/2017 0948   LABSPEC 1.021 05/20/2017 0948   PHURINE 7.0 05/20/2017 [place] 05/20/2017 0948   GLUCOSEU NEGATIVE 06/20/2008 1444   HGBUR NEGATIVE 05/20/2017 0948   BILIRUBINUR NEGATIVE 05/20/2017 0948   KETONESUR NEGATIVE  05/20/2017 0948   PROTEINUR NEGATIVE 05/20/2017 0948   UROBILINOGEN 0.2 mg/dL 06/20/2008 1444   NITRITE NEGATIVE 05/20/2017 0948   LEUKOCYTESUR NEGATIVE 05/20/2017 0948   Sepsis Labs: '@LABRCNTIP'$ (procalcitonin:4,lacticidven:4)  ) Recent Results (from the past 240 hour(s))  Culture, blood (routine x 2)     Status: None (Preliminary result)   Collection Time: 05/30/17  2:55 PM  Result Value Ref Range Status   Specimen Description BLOOD LEFT FOREARM  Final   Special Requests   Final    BOTTLES DRAWN AEROBIC AND ANAEROBIC Blood Culture adequate volume   Culture NO GROWTH < 24 HOURS  Final   Report Status PENDING  Incomplete  Culture, blood (routine x 2)     Status: None (Preliminary result)   Collection Time: 05/30/17  3:03 PM  Result Value Ref Range Status   Specimen Description BLOOD LEFT FOREARM  Final   Special Requests   Final    BOTTLES DRAWN AEROBIC AND ANAEROBIC Blood Culture adequate volume   Culture  Setup Time   Final    GRAM POSITIVE COCCI IN CLUSTERS AEROBIC BOTTLE ONLY CRITICAL RESULT CALLED TO, READ BACK BY AND VERIFIED WITH: TDonzetta Kohut [id number] 06/01/2017 T. TYSOR    Culture GRAM POSITIVE COCCI  Final   Report Status PENDING  Incomplete  Blood Culture ID Panel (Reflexed)     Status: Abnormal   Collection Time: 05/30/17  3:03 PM  Result Value Ref Range Status   Enterococcus species NOT DETECTED NOT DETECTED Final   Listeria monocytogenes NOT DETECTED NOT DETECTED Final   Staphylococcus species DETECTED (A) NOT DETECTED Final    Comment: Methicillin (oxacillin) susceptible coagulase negative staphylococcus. Possible blood culture contaminant (unless isolated from more than one blood culture draw or clinical case suggests pathogenicity). No antibiotic treatment is indicated for blood  culture contaminants. CRITICAL RESULT CALLED TO, READ BACK BY AND VERIFIED WITH: T. Donzetta Kohut [id number] 06/01/2017 T. TYSOR    Staphylococcus aureus NOT DETECTED NOT DETECTED Final    Methicillin resistance NOT DETECTED NOT DETECTED Final   Streptococcus species NOT DETECTED NOT DETECTED Final   Streptococcus agalactiae NOT DETECTED NOT DETECTED Final   Streptococcus pneumoniae NOT DETECTED NOT DETECTED Final   Streptococcus pyogenes NOT DETECTED NOT DETECTED Final   Acinetobacter baumannii NOT DETECTED NOT DETECTED Final   Enterobacteriaceae species NOT DETECTED NOT DETECTED Final   Enterobacter cloacae complex NOT DETECTED NOT DETECTED Final   Escherichia coli NOT DETECTED NOT DETECTED Final   Klebsiella oxytoca NOT DETECTED NOT DETECTED Final   Klebsiella pneumoniae NOT DETECTED NOT DETECTED Final   Proteus species NOT DETECTED NOT DETECTED Final   Serratia marcescens NOT DETECTED NOT DETECTED Final   Haemophilus influenzae NOT DETECTED NOT DETECTED Final   Neisseria meningitidis NOT DETECTED NOT DETECTED Final   Pseudomonas aeruginosa NOT DETECTED NOT DETECTED Final   Candida albicans NOT DETECTED NOT DETECTED Final   Candida glabrata NOT DETECTED NOT DETECTED Final   Candida krusei NOT DETECTED NOT DETECTED Final   Candida parapsilosis NOT DETECTED NOT DETECTED Final   Candida tropicalis NOT DETECTED NOT DETECTED Final         Radiology Studies: Dg Chest Port 1 View  Result Date: 05/30/2017 CLINICAL DATA:  Fevers of unknown origin EXAM:  PORTABLE CHEST 1 VIEW COMPARISON:  05/28/2017 FINDINGS: Cardiac shadow is stable. Right jugular line is been removed. The overall inspiratory effort is poor with some crowding of the vascular markings. No focal infiltrate or sizable effusion is seen. No bony abnormality is noted. IMPRESSION: Poor inspiratory effort without acute abnormality. Electronically Signed   By: Inez Catalina M.D.   On: 05/30/2017 14:54        Scheduled Meds: . aspirin  325 mg Per Tube Daily  . atorvastatin  80 mg Per Tube q1800  . chlorhexidine gluconate (MEDLINE KIT)  15 mL Mouth Rinse BID  . Chlorhexidine Gluconate Cloth  6 each Topical Daily   . clopidogrel  75 mg Per Tube Daily  . enoxaparin (LOVENOX) injection  40 mg Subcutaneous Q24H  . feeding supplement (PRO-STAT SUGAR FREE 64)  30 mL Per Tube BID  . folic acid  1 mg Oral Daily  . insulin aspart  0-15 Units Subcutaneous Q4H  . mouth rinse  15 mL Mouth Rinse BID  . metoprolol tartrate  12.5 mg Oral BID  . multivitamin  1 tablet Oral Daily  . niacin  500 mg Oral Daily  . pantoprazole  40 mg Oral Daily  . QUEtiapine  50 mg Oral QHS  . senna-docusate  1 tablet Oral QHS  . sodium chloride flush  10-40 mL Intracatheter Q12H  . thiamine  100 mg Oral Daily   Continuous Infusions: . dexmedetomidine (PRECEDEX) IV infusion Stopped (05/27/17 1820)  . feeding supplement (JEVITY 1.2 CAL) 1,000 mL (06/01/17 0317)     LOS: 16 days    Time spent: 40 minutes    Charline Hoskinson, Geraldo Docker, MD [facility] Pager [phone]   If 7PM-7AM, please contact night-coverage [email or web address] Password [email or web address] 06/01/2017, 7:57 AM

## 2017-06-01 NOTE — Progress Notes (Signed)
PHARMACY - PHYSICIAN COMMUNICATION CRITICAL VALUE ALERT - BLOOD CULTURE IDENTIFICATION (BCID)  Results for orders placed or performed during the hospital encounter of 05/16/17  Blood Culture ID Panel (Reflexed) (Collected: 05/30/2017  3:03 PM)  Result Value Ref Range   Enterococcus species NOT DETECTED NOT DETECTED   Listeria monocytogenes NOT DETECTED NOT DETECTED   Staphylococcus species DETECTED (A) NOT DETECTED   Staphylococcus aureus NOT DETECTED NOT DETECTED   Methicillin resistance NOT DETECTED NOT DETECTED   Streptococcus species NOT DETECTED NOT DETECTED   Streptococcus agalactiae NOT DETECTED NOT DETECTED   Streptococcus pneumoniae NOT DETECTED NOT DETECTED   Streptococcus pyogenes NOT DETECTED NOT DETECTED   Acinetobacter baumannii NOT DETECTED NOT DETECTED   Enterobacteriaceae species NOT DETECTED NOT DETECTED   Enterobacter cloacae complex NOT DETECTED NOT DETECTED   Escherichia coli NOT DETECTED NOT DETECTED   Klebsiella oxytoca NOT DETECTED NOT DETECTED   Klebsiella pneumoniae NOT DETECTED NOT DETECTED   Proteus species NOT DETECTED NOT DETECTED   Serratia marcescens NOT DETECTED NOT DETECTED   Haemophilus influenzae NOT DETECTED NOT DETECTED   Neisseria meningitidis NOT DETECTED NOT DETECTED   Pseudomonas aeruginosa NOT DETECTED NOT DETECTED   Candida albicans NOT DETECTED NOT DETECTED   Candida glabrata NOT DETECTED NOT DETECTED   Candida krusei NOT DETECTED NOT DETECTED   Candida parapsilosis NOT DETECTED NOT DETECTED   Candida tropicalis NOT DETECTED NOT DETECTED    Name of physician (or Provider) Contacted: K. Sofia, PA-C  Changes to prescribed antibiotics required:   No antibiotics for now.   1 of 2 bottles.  Possible contaminant.   Dennie Fetters, [place] Pager: [phone] 06/01/2017  4:54 AM

## 2017-06-02 LAB — URINE CULTURE: Culture: >=100000 — AB

## 2017-06-02 LAB — CULTURE, BLOOD (ROUTINE X 2): Special Requests: ADEQUATE

## 2017-06-02 LAB — PHOSPHORUS: PHOSPHORUS: 4 mg/dL (ref 2.5–4.6)

## 2017-06-02 LAB — BASIC METABOLIC PANEL
Anion gap: 8 (ref 5–15)
BUN: 17 mg/dL (ref 6–20)
CHLORIDE: 103 mmol/L (ref 101–111)
CO2: 23 mmol/L (ref 22–32)
CREATININE: 0.66 mg/dL (ref 0.61–1.24)
Calcium: 8.4 mg/dL — ABNORMAL LOW (ref 8.9–10.3)
GFR calc Af Amer: >60 mL/min (ref >60)
Glucose, Bld: 150 mg/dL — ABNORMAL HIGH (ref 65–99)
Potassium: 4.5 mmol/L (ref 3.5–5.1)
SODIUM: 134 mmol/L — AB (ref 135–145)

## 2017-06-02 LAB — GLUCOSE, CAPILLARY
GLUCOSE-CAPILLARY: 144 mg/dL — AB (ref 65–99)
GLUCOSE-CAPILLARY: 115 mg/dL — AB (ref 65–99)
GLUCOSE-CAPILLARY: 158 mg/dL — AB (ref 65–99)
Glucose-Capillary: 140 mg/dL — ABNORMAL HIGH (ref 65–99)
Glucose-Capillary: 143 mg/dL — ABNORMAL HIGH (ref 65–99)
Glucose-Capillary: 159 mg/dL — ABNORMAL HIGH (ref 65–99)

## 2017-06-02 LAB — MAGNESIUM: MAGNESIUM: 1.9 mg/dL (ref 1.7–2.4)

## 2017-06-02 MED ORDER — FOLIC ACID 1 MG PO TABS
1.0000 mg | ORAL_TABLET | Freq: Every day | ORAL | Status: DC
  Administered 2017-06-03: 1 mg
  Filled 2017-06-02: qty 1

## 2017-06-02 MED ORDER — HALOPERIDOL LACTATE 5 MG/ML IJ SOLN: 2.0000 mg | INTRAMUSCULAR | Status: DC | PRN

## 2017-06-02 MED ORDER — QUETIAPINE FUMARATE 25 MG PO TABS
25.0000 mg | ORAL_TABLET | Freq: Every day | ORAL | Status: DC
  Administered 2017-06-02: 25 mg via ORAL
  Filled 2017-06-02: qty 1

## 2017-06-02 NOTE — Progress Notes (Signed)
Occupational Therapy Treatment Patient Details Name: Henry David MRN: [id number] DOB: 06/15/1961 Today's Date: 06/02/2017    History of present illness Pt is a 56 y.o. male with history of CAD, Cocaine use, Anxiety, Apical mural thromus, Depression, Essential HTN, PTSD, STEMI, Knee sx, Cardiac cath x3. Pt prsented with aphasia and R sided weakness. CT on 10/5 showed large L MCA infarct. CT on 10/13 showed increased intracranial mass effect with right midline shift now 14 mm.   OT comments  Focus of session on bed mobility, sitting balance and EOB with grooming activity and following visual target across midline toward R side. Pt transferred with Stedy to chair, requiring +2 mod assist to stand. Following simple commands requires multimodal cues and pt responds inconsistently. Updated d/c disposition to SNF.  Follow Up Recommendations  SNF;Supervision/Assistance - 24 hour    Equipment Recommendations  None recommended by OT    Recommendations for Other Services      Precautions / Restrictions Precautions Precaution Comments: PEG, abdominal binder       Mobility Bed Mobility Overal bed mobility: Needs Assistance Bed Mobility: Rolling;Sidelying to Sit Rolling: Max assist Sidelying to sit: +2 for physical assistance;Max assist       General bed mobility comments: max assist to flex L knee and assistp pt to reach to R rail with L hand, assist for R LE to EOB, to raise trunk and shift hips to EOB with bed pad  Transfers Overall transfer level: Needs assistance Equipment used:  (stedy) Transfers: Sit to/from Stand Sit to Stand: +2 physical assistance;Mod assist         General transfer comment: used L UE to pull up with steady, assist of bed pad to rise, R side lean    Balance Overall balance assessment: Needs assistance   Sitting balance-Leahy Scale: Poor Sitting balance - Comments: can maintain static sitting with min guard assist, unable to withstand challenge of  reaching Postural control: Right lateral lean;Posterior lean Standing balance support: Single extremity supported Standing balance-Leahy Scale: Poor Standing balance comment: requires mod/max assist when standing in steady, heavy lean to R                           ADL either performed or assessed with clinical judgement   ADL Overall ADL's : Needs assistance/impaired     Grooming: Brushing hair;Sitting;Moderate assistance Grooming Details (indicate cue type and reason): only combs L side of head, despite cues                                     Vision   Additional Comments: worked on tracking OT toward L side, able to follow to 45 degress with head turn   Perception     Praxis      Cognition Arousal/Alertness: Awake/alert Behavior During Therapy: WFL for tasks assessed/performed (smiling at times) Overall Cognitive Status: Impaired/Different from baseline Area of Impairment: Attention;Following commands                   Current Attention Level: Sustained   Following Commands: Follows one step commands with increased time;Follows one step commands inconsistently (and multimodal cues)     Problem Solving: Decreased initiation;Slow processing General Comments: expressive and receptive language deficits        Exercises     Shoulder Instructions       General Comments  Pertinent Vitals/ Pain       Pain Assessment: Faces Faces Pain Scale: Hurts a little bit Pain Location: R shoulder Pain Descriptors / Indicators: Grimacing Pain Intervention(s): Repositioned  Home Living                                          Prior Functioning/Environment              Frequency  Min 3X/week        Progress Toward Goals  OT Goals(current goals can now be found in the care plan section)  Progress towards OT goals: Progressing toward goals  Acute Rehab OT Goals Patient Stated Goal: unable to state OT Goal  Formulation: Patient unable to participate in goal setting Time For Goal Achievement: 06/08/17 Potential to Achieve Goals: Good  Plan Discharge plan needs to be updated    Co-evaluation    PT/OT/SLP Co-Evaluation/Treatment: Yes Reason for Co-Treatment: For patient/therapist safety   OT goals addressed during session: ADL's and self-care;Strengthening/ROM      AM-PAC PT "6 Clicks" Daily Activity     Outcome Measure   Help from another person eating meals?: Total Help from another person taking care of personal grooming?: A Lot Help from another person toileting, which includes using toliet, bedpan, or urinal?: Total Help from another person bathing (including washing, rinsing, drying)?: Total Help from another person to put on and taking off regular upper body clothing?: Total Help from another person to put on and taking off regular lower body clothing?: Total 6 Click Score: 7    End of Session    OT Visit Diagnosis: Unsteadiness on feet (R26.81);Other abnormalities of gait and mobility (R26.89);Muscle weakness (generalized) (M62.81);Hemiplegia and hemiparesis Hemiplegia - Right/Left: Right Hemiplegia - dominant/non-dominant: Dominant Hemiplegia - caused by: Cerebral infarction   Activity Tolerance Patient tolerated treatment well   Patient Left in chair;with call bell/phone within reach;with chair alarm set   Nurse Communication Need for lift equipment        Time: 1345-1415 OT Time Calculation (min): 30 min  Charges: OT General Charges $OT Visit: 1 Visit OT Treatments $Therapeutic Activity: 8-22 mins  06/02/2017 [name], OTR/L Pager: [phone]  [name], [name] 06/02/2017, 2:26 PM

## 2017-06-02 NOTE — Plan of Care (Signed)
Problem: Education: Goal: Knowledge of disease or condition will improve Outcome: Progressing Patient arousable and discussed plan of care for the evening with no teach back displayed at this time

## 2017-06-02 NOTE — Progress Notes (Signed)
  Speech Language Pathology Treatment: Dysphagia;Cognitive-Linquistic  Patient Details Name: Henry David MRN: [id number] DOB: 1961-02-09 Today's Date: 06/02/2017 Time: 8889-1694 SLP Time Calculation (min) (ACUTE ONLY): 14 min  Assessment / Plan / Recommendation Clinical Impression  Pt seen during breakfast meal for skilled observation of diet tolerance and cognitive-linguistic therapy. Pt used pointing to select between binary drink options and followed simple, one-step instructions with Mod cues within functional context. He is making attempts to communicate some this morning, but verbal output is comprised of neologisms. Pt had prolonged bolus formation and transit with pureed solids, but used honey thick liquid washes with Mod I to facilitate oral clearance. SLP provided Min cues for slower pacing with drinking. Intermittent anterior spillage was also observed, seemingly without awareness and therefore needing assistance from SLP and RN to clear. Pt had one delayed cough observed across all intake. Recommend to continue with current diet and precautions.   HPI HPI: Henry David a 56 y.o.malewith history of anxiety, depression, diverticulitis, GERD, cocaine use,history of apical mural thrombus, hypertension, hyperlipidemia and coronary artery diseasepresenting with global aphasia, left gaze preference and right hemiplegia. Hedidnot receive IV t-PA due to late presentation. Intubated 10/5-10/9. Head MRI 10/10 shows large evolving acute ischemic infarct involving essentially the entirety of the left MCA territory, similar to previous exams and associated mass effect with 12 mm of left-to-right midline shift, worsened relative to most recent CT from 05/18/2017.       SLP Plan  Continue with current plan of care       Recommendations  Diet recommendations: Dysphagia 1 (puree);Honey-thick liquid Liquids provided via: Cup Medication Administration: Crushed with  puree Supervision: Full supervision/cueing for compensatory strategies Compensations: Slow rate;Small sips/bites Postural Changes and/or Swallow Maneuvers: Seated upright 90 degrees                Oral Care Recommendations: Oral care before and after PO Follow up Recommendations: Inpatient Rehab SLP Visit Diagnosis: Dysphagia, oropharyngeal phase (R13.12);Cognitive communication deficit (R41.841);Aphasia (R47.01) Plan: Continue with current plan of care       GO                [name] 06/02/2017, 10:44 AM  [name], M.A. CCC-SLP [phone]

## 2017-06-02 NOTE — Progress Notes (Signed)
Physical Therapy Treatment Patient Details Name: Henry David Passarella MRN: [id number] DOB: 04/23/1961 Today's Date: 06/02/2017    History of Present Illness Pt is a 56 y.o. male with history of CAD, Cocaine use, Anxiety, Apical mural thromus, Depression, Essential HTN, PTSD, STEMI, Knee sx, Cardiac cath x3. Pt prsented with aphasia and R sided weakness. CT on 10/5 showed large L MCA infarct. CT on 10/13 showed increased intracranial mass effect with right midline shift now 14 mm.    PT Comments    Pt making good progress with sitting balance.   Follow Up Recommendations  SNF     Equipment Recommendations  Wheelchair (measurements PT);Wheelchair cushion (measurements PT);Hospital bed    Recommendations for Other Services       Precautions / Restrictions Precautions Precaution Comments: PEG, abdominal binder Restrictions Weight Bearing Restrictions: No    Mobility  Bed Mobility Overal bed mobility: Needs Assistance Bed Mobility: Rolling;Sidelying to Sit Rolling: Max assist Sidelying to sit: +2 for physical assistance;Max assist       General bed mobility comments: max assist to flex L knee and assistp pt to reach to R rail with L hand, assist for R LE to EOB, to raise trunk and shift hips to EOB with bed pad  Transfers Overall transfer level: Needs assistance Equipment used:  (stedy) Transfers: Sit to/from [employer]GI CorporationStand;Stand Pivot Transfers Sit to Stand: +2 physical assistance;Mod assist Stand pivot transfers: +2 physical assistance;Max assist       General transfer comment: used L UE to pull up with steady, assist of bed pad to rise, R side lean. With pt on Stedy pt with heavy rt lean requiring max assist to keep positioned.  Ambulation/Gait                 Stairs            Wheelchair Mobility    Modified Rankin (Stroke Patients Only) Modified Rankin (Stroke Patients Only) Pre-Morbid Rankin Score: No symptoms Modified Rankin: Severe disability      Balance Overall balance assessment: Needs assistance Sitting-balance support: Single extremity supported;Feet supported Sitting balance-Leahy Scale: Poor Sitting balance - Comments: can maintain static sitting with min guard assist, unable to withstand challenge of reaching. Sat EOB x 10-12 minutes Postural control: Right lateral lean;Posterior lean Standing balance support: Single extremity supported Standing balance-Leahy Scale: Poor Standing balance comment: requires mod/max assist when standing in steady, heavy lean to R                            Cognition Arousal/Alertness: Awake/alert Behavior During Therapy: WFL for tasks assessed/performed (smiling at times) Overall Cognitive Status: Impaired/Different from baseline Area of Impairment: Attention;Following commands                   Current Attention Level: Sustained   Following Commands: Follows one step commands with increased time;Follows one step commands inconsistently (and multimodal cues)     Problem Solving: Decreased initiation;Slow processing General Comments: expressive and receptive language deficits      Exercises      General Comments        Pertinent Vitals/Pain Pain Assessment: Faces Faces Pain Scale: Hurts a little bit Pain Location: R shoulder Pain Descriptors / Indicators: Grimacing Pain Intervention(s): Repositioned    Home Living                      Prior Function  PT Goals (current goals can now be found in the care plan section) Acute Rehab PT Goals Patient Stated Goal: unable to state Progress towards PT goals: Progressing toward goals    Frequency    Min 3X/week      PT Plan Current plan remains appropriate;Frequency needs to be updated    Co-evaluation PT/OT/SLP Co-Evaluation/Treatment: Yes Reason for Co-Treatment: For patient/therapist safety PT goals addressed during session: Mobility/safety with mobility;Balance OT goals  addressed during session: ADL's and self-care;Strengthening/ROM      AM-PAC PT "6 Clicks" Daily Activity  Outcome Measure  Difficulty turning over in bed (including adjusting bedclothes, sheets and blankets)?: Unable Difficulty moving from lying on back to sitting on the side of the bed? : Unable Difficulty sitting down on and standing up from a chair with arms (e.g., wheelchair, bedside commode, etc,.)?: Unable Help needed moving to and from a bed to chair (including a wheelchair)?: Total Help needed walking in hospital room?: Total Help needed climbing 3-5 steps with a railing? : Total 6 Click Score: 6    End of Session   Activity Tolerance: Patient tolerated treatment well Patient left: in chair;with call bell/phone within reach;with chair alarm set Nurse Communication: Mobility status;Need for lift equipment PT Visit Diagnosis: Difficulty in walking, not elsewhere classified (R26.2);Muscle weakness (generalized) (M62.81);Hemiplegia and hemiparesis Hemiplegia - Right/Left: Right Hemiplegia - dominant/non-dominant: Dominant Hemiplegia - caused by: Cerebral infarction     Time: 1345-1415 PT Time Calculation (min) (ACUTE ONLY): 30 min  Charges:  $Therapeutic Activity: 8-22 mins                    G Codes:       [facility] PT [phone]    Angelina Ok [facility] 06/02/2017, 4:10 PM

## 2017-06-02 NOTE — Progress Notes (Addendum)
3:30: CSW spoke with [place] at [facility]. At this time she is agreeable to take pt if ready tomorrow. CSW went to update pt of placement, however pt is unable to speak with CSW due to stroke. CSW will reach out to emergency contacts in the chart.   CSW received call from RN CM informed CSW that pt could be being discharged tomorrow. CSW reached out to admissions at [facility]) concerning placement for pt. Tameka confirmed that they are able to meet pt needs with an LOG. CSW attempted to follow up with Tameka again to inform her that pt could be coming to the facility tomorrow. CSW left VM for Tameka to call CSW back.   CSW has also been speaking with Wallie Char from [facility] to see if they can meet pt's as well.    Claude Manges Ashlon Lottman, MSW, LCSW-A Emergency Department Clinical Social Worker [phone]

## 2017-06-02 NOTE — Progress Notes (Signed)
Inpatient Rehabilitation  Discussed case with acute CSW and nurse case manager team and offered to reduce burden of care prior to likely placement at a SNF given severity of deficits and that patient has no next of kin. However, Medical Director for case management stated that if an LOG was needed then he would rather place from acute care.  Updated current nurse case manager and CSW.  Will continue to follow at a distance in case circumstances change.  Call if questions.   Charlane Ferretti., CCC/SLP Admission Coordinator  [facility]  Cell [phone]

## 2017-06-02 NOTE — Progress Notes (Signed)
At this time pt has not received any bed offers. CSW spoke with [facility] to see if they would be agreeable to take pt. If they are unable to meet pt needs, clinical supervisor Wandra Mannan will assist. CSW continues to follow for discharge needs.     Henry David Taqwa Deem, MSW, LCSW-A Emergency Department Clinical Social Worker [phone]

## 2017-06-02 NOTE — Progress Notes (Signed)
[place] TEAM 1 - Stepdown/ICU TEAM  Henry David  UOR:[id number] DOB: [birth year] DOA: 05/16/2017 PCP: Arnoldo Morale, MD    Brief Narrative:  56yo M w/ Hx Anxiety, Depression, PTSD, Apical mural thrombus, STEMI, HTN, CAD, HLD, BPH, and Diverticulitis who was found down on 10/5. CT head revealed a large area of L MCA territory infarction. 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 He was unable to provide any history and on arrival in the intensive care unit his respirations were intermittently sonorous requiring intubation for airway protection.  Significant Events: 10/5 - Presented to ED with large Left MCA CVA - intubated  10/6 - 3% NS 10/9 - Extubated  10/10-  enceph > precedex 10/16 - Slow wean of 3% Saline per Neuro started 9 am 10/17 - stopped 3% saline at 9AM -  failed SLP 10/18 - PEG per Gen Surgery   Subjective: The patient is laying in bed without signs of respiratory distress or uncontrolled pain.  He is aphasic.  He opens his eyes and follows the examiner during the examination but does not follow simple commands nor does he attempt to communicate.  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 with global aphasia, R hemiplegia, and cerebral edema - secondary to Cocaine abuse, EtOH abuse and HTN - developed progressive midline shift to 17m requiring hot salt - PT/OT recommend CIR  Hx of apical mural thrombus May 2017 F/U TTE Nov 2017 w/o thrombus - anticoag stopped Dec 2017 - no evidence of thrombus this admit   E coli UTI Pt was having fevers - urine culture revealed >100K E coli - will tx for 5 days - pt DOES NOT have a foley in place   Coag neg Staph in 1 of 2 blood cx Fever explained by UTI - suspect this is a contaminant   Severe encephalopathy waxing and waning - follow - avoid sedatives as able   Cocaine abuse  Ongoing use will likely not occur due to severe deficits at this time    Chronic Systolic CHF EF [id number]%per TTE this admit w/ multiple [facility] of [facility] Filed Weights   05/31/17 1238 06/01/17 0305 06/02/17 0249  Weight: 89.1 kg (196 lb 6.4 oz) 89.3 kg (196 lb 14.4 oz) 89.6 kg (197 lb 8.5 oz)    HTN Long term BP goal 130-150 in setting of L ICA occlusion - presently dipping below goal at times - D/C BB and follow   CAD S/p stenting on DAPT   DM2  10/6 A1c 6.1 - CBG controlled   Hypertriglyceridemia / HLD Cont Lipitor and Niacin  Hyponatremia Follow trend   Hypokalemia Corrected   Dysphagia Dysphagia 1, honey thick liquid as per SLP eval 10/19   DVT prophylaxis: Lovenox Code Status: FULL CODE Family Communication: no family present at time of exam  Disposition Plan: SNF placement   Consultants:  Stroke Team PCCM Gen Surgery   Antimicrobials:  Rocephin 10/21 >  Objective: Blood pressure 134/76, pulse (!) 108, temperature 98.3 F (36.8 C), temperature source Oral, resp. rate (!) 21, height 5' 10" (1.778 m), weight 89.6 kg (197 lb 8.5 oz), SpO2 92 %.  Intake/Output Summary (Last 24 hours) at 06/02/17 0941 Last data filed at 06/02/17 0830  Gross per 24 hour  Intake             2180 ml  Output  2275 ml  Net              -95 ml   Filed Weights   05/31/17 1238 06/01/17 0305 06/02/17 0249  Weight: 89.1 kg (196 lb 6.4 oz) 89.3 kg (196 lb 14.4 oz) 89.6 kg (197 lb 8.5 oz)    Examination: General: No acute respiratory distress Lungs: Clear to auscultation bilaterally without wheezes or crackles Cardiovascular: Regular rate and rhythm without murmur gallop or rub normal S1 and S2 Abdomen: Nontender, nondistended, soft, bowel sounds positive, no rebound, no ascites, no appreciable mass - PEG insertion clean and dry  Extremities: No significant cyanosis, clubbing, or edema bilateral lower extremities  CBC:  Recent Labs Lab 05/27/17 0412 05/28/17 0534 05/30/17 1212 06/01/17 0546  WBC 8.5 11.4* 9.7 8.4  NEUTROABS 5.7  8.4*  --   --   HGB 11.8* 13.5 14.2 13.1  HCT 35.4* 39.6 41.6 39.5  MCV 84.3 83.7 84.7 85.9  PLT 311 409* 432* 428*   Basic Metabolic Panel:  Recent Labs Lab 05/28/17 2345 05/29/17 0906  05/30/17 0644  05/31/17 0052 05/31/17 0624 05/31/17 1237 06/01/17 0546 06/02/17 0457  NA 133* 137  < > 137  < > 137 137 138 138 134*  K 3.2* 3.0*  --  3.1*  --   --  3.5  --  3.7 4.5  CL 104 105  --  104  --   --  105  --  104 103  CO2 21* 21*  --  22  --   --  23  --  25 23  GLUCOSE 121* 102*  --  146*  --   --  144*  --  161* 150*  BUN 13 16  --  20  --   --  20  --  18 17  CREATININE 0.68 0.81  --  0.72  --   --  0.72  --  0.67 0.66  CALCIUM 8.2* 8.5*  --  8.7*  --   --  8.5*  --  8.7* 8.4*  MG 2.0 2.0  --  1.9  --   --  1.9  --  1.9 1.9  PHOS 3.3  --   --  3.1  --   --  3.0  --  3.5 4.0  < > = values in this interval not displayed. GFR: Estimated Creatinine Clearance: 113.3 mL/min (by C-G formula based on SCr of 0.66 mg/dL).  Liver Function Tests: No results for input(s): AST, ALT, ALKPHOS, BILITOT, PROT, ALBUMIN in the last 168 hours. No results for input(s): LIPASE, AMYLASE in the last 168 hours. No results for input(s): AMMONIA in the last 168 hours.   HbA1C: Hgb A1c MFr Bld  Date/Time Value Ref Range Status  05/17/2017 05:00 AM 6.1 (H) 4.8 - 5.6 % Final    Comment:    (NOTE) Pre diabetes:          5.7%-6.4% Diabetes:              >6.4% Glycemic control for   <7.0% adults with diabetes   12/29/2015 03:50 AM 6.6 (H) 4.8 - 5.6 % Final    Comment:    (NOTE)         Pre-diabetes: 5.7 - 6.4         Diabetes: >6.4         Glycemic control for adults with diabetes: <7.0     CBG:  Recent Labs Lab 06/01/17 1702 06/01/17 1930 06/01/17 2319 06/02/17   0341 06/02/17 0826  GLUCAP 135* 133* 144* 140* 144*    Recent Results (from the past 240 hour(s))  Culture, blood (routine x 2)     Status: None (Preliminary result)   Collection Time: 05/30/17  2:55 PM  Result Value  Ref Range Status   Specimen Description BLOOD LEFT FOREARM  Final   Special Requests   Final    BOTTLES DRAWN AEROBIC AND ANAEROBIC Blood Culture adequate volume   Culture NO GROWTH 2 DAYS  Final   Report Status PENDING  Incomplete  Culture, blood (routine x 2)     Status: Abnormal (Preliminary result)   Collection Time: 05/30/17  3:03 PM  Result Value Ref Range Status   Specimen Description BLOOD LEFT FOREARM  Final   Special Requests   Final    BOTTLES DRAWN AEROBIC AND ANAEROBIC Blood Culture adequate volume   Culture  Setup Time   Final    GRAM POSITIVE COCCI IN CLUSTERS AEROBIC BOTTLE ONLY CRITICAL RESULT CALLED TO, READ BACK BY AND VERIFIED WITH: T. EGAN,PHARMD [id number] 06/01/2017 T. TYSOR    Culture (A)  Final    STAPHYLOCOCCUS SPECIES (COAGULASE NEGATIVE) THE SIGNIFICANCE OF ISOLATING THIS ORGANISM FROM A SINGLE SET OF BLOOD CULTURES WHEN MULTIPLE SETS ARE DRAWN IS UNCERTAIN. PLEASE NOTIFY THE MICROBIOLOGY DEPARTMENT WITHIN ONE WEEK IF SPECIATION AND SENSITIVITIES ARE REQUIRED.    Report Status PENDING  Incomplete  Blood Culture ID Panel (Reflexed)     Status: Abnormal   Collection Time: 05/30/17  3:03 PM  Result Value Ref Range Status   Enterococcus species NOT DETECTED NOT DETECTED Final   Listeria monocytogenes NOT DETECTED NOT DETECTED Final   Staphylococcus species DETECTED (A) NOT DETECTED Final    Comment: Methicillin (oxacillin) susceptible coagulase negative staphylococcus. Possible blood culture contaminant (unless isolated from more than one blood culture draw or clinical case suggests pathogenicity). No antibiotic treatment is indicated for blood  culture contaminants. CRITICAL RESULT CALLED TO, READ BACK BY AND VERIFIED WITH: T. Donzetta Kohut [id number] 06/01/2017 T. TYSOR    Staphylococcus aureus NOT DETECTED NOT DETECTED Final   Methicillin resistance NOT DETECTED NOT DETECTED Final   Streptococcus species NOT DETECTED NOT DETECTED Final   Streptococcus agalactiae NOT  DETECTED NOT DETECTED Final   Streptococcus pneumoniae NOT DETECTED NOT DETECTED Final   Streptococcus pyogenes NOT DETECTED NOT DETECTED Final   Acinetobacter baumannii NOT DETECTED NOT DETECTED Final   Enterobacteriaceae species NOT DETECTED NOT DETECTED Final   Enterobacter cloacae complex NOT DETECTED NOT DETECTED Final   Escherichia coli NOT DETECTED NOT DETECTED Final   Klebsiella oxytoca NOT DETECTED NOT DETECTED Final   Klebsiella pneumoniae NOT DETECTED NOT DETECTED Final   Proteus species NOT DETECTED NOT DETECTED Final   Serratia marcescens NOT DETECTED NOT DETECTED Final   Haemophilus influenzae NOT DETECTED NOT DETECTED Final   Neisseria meningitidis NOT DETECTED NOT DETECTED Final   Pseudomonas aeruginosa NOT DETECTED NOT DETECTED Final   Candida albicans NOT DETECTED NOT DETECTED Final   Candida glabrata NOT DETECTED NOT DETECTED Final   Candida krusei NOT DETECTED NOT DETECTED Final   Candida parapsilosis NOT DETECTED NOT DETECTED Final   Candida tropicalis NOT DETECTED NOT DETECTED Final  Culture, Urine     Status: Abnormal   Collection Time: 05/30/17  7:00 PM  Result Value Ref Range Status   Specimen Description URINE, RANDOM  Final   Special Requests NONE  Final   Culture >=100,000 COLONIES/mL ESCHERICHIA COLI (A)  Final   Report  Status 06/02/2017 FINAL  Final   Organism ID, Bacteria ESCHERICHIA COLI (A)  Final      Susceptibility   Escherichia coli - MIC*    AMPICILLIN >=32 RESISTANT Resistant     CEFAZOLIN <=4 SENSITIVE Sensitive     CEFTRIAXONE <=1 SENSITIVE Sensitive     CIPROFLOXACIN <=0.25 SENSITIVE Sensitive     GENTAMICIN <=1 SENSITIVE Sensitive     IMIPENEM <=0.25 SENSITIVE Sensitive     NITROFURANTOIN <=16 SENSITIVE Sensitive     TRIMETH/SULFA <=20 SENSITIVE Sensitive     AMPICILLIN/SULBACTAM >=32 RESISTANT Resistant     PIP/TAZO <=4 SENSITIVE Sensitive     Extended ESBL NEGATIVE Sensitive     * >=100,000 COLONIES/mL ESCHERICHIA COLI      Scheduled Meds: . aspirin  325 mg Per Tube Daily  . atorvastatin  80 mg Per Tube q1800  . chlorhexidine gluconate (MEDLINE KIT)  15 mL Mouth Rinse BID  . Chlorhexidine Gluconate Cloth  6 each Topical Daily  . clopidogrel  75 mg Per Tube Daily  . enoxaparin (LOVENOX) injection  40 mg Subcutaneous Q24H  . feeding supplement (PRO-STAT SUGAR FREE 64)  30 mL Per Tube BID  . folic acid  1 mg Oral Daily  . insulin aspart  0-15 Units Subcutaneous Q4H  . mouth rinse  15 mL Mouth Rinse BID  . metoprolol tartrate  12.5 mg Oral BID  . multivitamin  1 tablet Oral Daily  . niacin  500 mg Oral Daily  . pantoprazole  40 mg Oral Daily  . QUEtiapine  50 mg Oral QHS  . senna-docusate  1 tablet Oral QHS  . sodium chloride flush  10-40 mL Intracatheter Q12H  . thiamine  100 mg Oral Daily     LOS: 17 days    T. , MD [facility] Office  [phone] Pager - Text Page per Amion as per below:  On-Call/Text Page:      [email or web address]      password [email or web address]  If 7PM-7AM, please contact night-coverage [email or web address] Password [email or web address] 06/02/2017, 9:41 AM     

## 2017-06-02 NOTE — Progress Notes (Signed)
Nurse suggested a visit to this patient may be good.  Went into room. Patient looked at me but seemed not to understand anything.  I said a short prayer for him. (not able to communicate) Phebe Colla, Chaplain   06/02/17 1500  Clinical Encounter Type  Visited With Patient  Visit Type Initial;Spiritual support  Spiritual Encounters  Spiritual Needs Prayer  Stress Factors  Patient Stress Factors None identified  Family Stress Factors None identified

## 2017-06-03 DIAGNOSIS — J9601 Acute respiratory failure with hypoxia: Secondary | ICD-10-CM

## 2017-06-03 LAB — GLUCOSE, CAPILLARY
GLUCOSE-CAPILLARY: 147 mg/dL — AB (ref 65–99)
GLUCOSE-CAPILLARY: 130 mg/dL — AB (ref 65–99)
Glucose-Capillary: 124 mg/dL — ABNORMAL HIGH (ref 65–99)
Glucose-Capillary: 176 mg/dL — ABNORMAL HIGH (ref 65–99)
Glucose-Capillary: 144 mg/dL — ABNORMAL HIGH (ref 65–99)

## 2017-06-03 LAB — BASIC METABOLIC PANEL
Anion gap: 12 (ref 5–15)
BUN: 19 mg/dL (ref 6–20)
CO2: 28 mmol/L (ref 22–32)
Calcium: 9.6 mg/dL (ref 8.9–10.3)
Chloride: 99 mmol/L — ABNORMAL LOW (ref 101–111)
Creatinine, Ser: 0.75 mg/dL (ref 0.61–1.24)
GFR calc Af Amer: >60 mL/min (ref >60)
GLUCOSE: 144 mg/dL — AB (ref 65–99)
POTASSIUM: 3.7 mmol/L (ref 3.5–5.1)
Sodium: 139 mmol/L (ref 135–145)

## 2017-06-03 MED ORDER — ACETAMINOPHEN 160 MG/5ML PO SOLN: 650.0000 mg | ORAL | 0 refills | Status: AC | PRN

## 2017-06-03 MED ORDER — FOLIC ACID 1 MG PO TABS: 1.0000 mg | ORAL_TABLET | Freq: Every day | ORAL | Status: AC

## 2017-06-03 MED ORDER — CLOPIDOGREL BISULFATE 75 MG PO TABS: 75.0000 mg | ORAL_TABLET | Freq: Every day | ORAL | Status: DC

## 2017-06-03 MED ORDER — PRO-STAT SUGAR FREE PO LIQD: 30.0000 mL | Freq: Two times a day (BID) | ORAL | 0 refills | Status: AC

## 2017-06-03 MED ORDER — SENNOSIDES-DOCUSATE SODIUM 8.6-50 MG PO TABS: 1.0000 | ORAL_TABLET | Freq: Every day | ORAL | Status: AC

## 2017-06-03 MED ORDER — ACETAMINOPHEN 325 MG PO TABS: 650.0000 mg | ORAL_TABLET | ORAL | Status: AC | PRN

## 2017-06-03 MED ORDER — JEVITY 1.2 CAL PO LIQD: 1000.0000 mL | ORAL | 0 refills | Status: AC

## 2017-06-03 MED ORDER — ATORVASTATIN CALCIUM 80 MG PO TABS: 80.0000 mg | ORAL_TABLET | Freq: Every day | ORAL | Status: DC

## 2017-06-03 MED ORDER — THIAMINE HCL 100 MG PO TABS: 100.0000 mg | ORAL_TABLET | Freq: Every day | ORAL | Status: DC

## 2017-06-03 MED ORDER — CEFUROXIME AXETIL 500 MG PO TABS: 500.0000 mg | ORAL_TABLET | Freq: Two times a day (BID) | ORAL | Status: AC

## 2017-06-03 MED ORDER — INSULIN ASPART 100 UNIT/ML ~~LOC~~ SOLN: 0.0000 [IU] | SUBCUTANEOUS | 11 refills | Status: AC

## 2017-06-03 MED ORDER — NIACIN 500 MG PO TABS: 500.0000 mg | ORAL_TABLET | Freq: Every day | ORAL | Status: AC

## 2017-06-03 MED ORDER — QUETIAPINE FUMARATE 25 MG PO TABS: 25.0000 mg | ORAL_TABLET | Freq: Every day | ORAL | Status: AC

## 2017-06-03 MED ORDER — ASPIRIN 325 MG PO TABS: 325.0000 mg | ORAL_TABLET | Freq: Every day | ORAL | Status: DC

## 2017-06-03 MED ORDER — PANTOPRAZOLE SODIUM 40 MG PO TBEC: 40.0000 mg | DELAYED_RELEASE_TABLET | Freq: Every day | ORAL | Status: AC

## 2017-06-03 MED ORDER — CEFUROXIME AXETIL 500 MG PO TABS
500.0000 mg | ORAL_TABLET | Freq: Two times a day (BID) | ORAL | Status: DC
  Administered 2017-06-03: 500 mg via ORAL
  Filled 2017-06-03 (×2): qty 1

## 2017-06-03 MED ORDER — PROSIGHT PO TABS: 1.0000 | ORAL_TABLET | Freq: Every day | ORAL | 0 refills | Status: AC

## 2017-06-03 NOTE — Progress Notes (Addendum)
10:38am-CSW confirmed transport for pt today at 2pm. CSW attempted to reach out to pt's roommate Greig Castilla to inform him of transfer, however no answer but VM was left.   CSW spoke with Dr. Sharon Seller this morning and confirmed that pt would  be discharging today. CSW spoke with Effie Shy at [facility] and was informed that they did receive LOG for pt and that 2pm today works for pt to be transported to the facility. CSW reached out to [facility] to set transport up for pt for 2pm as pt will need special transport due to inability to sit up sometimes. CSW will send d/c summary through the hub when available and will update staff of time of transport once [facility] informs CSW. RN to call report to [phone].     Claude Manges Elon Eoff, MSW, LCSW-A Emergency Department Clinical Social Worker [phone]

## 2017-06-03 NOTE — Discharge Summary (Signed)
DISCHARGE SUMMARY  MILLS MCKAIG  MR#: [id number]  DOB:10-Dec-1960  Date of Admission: 05/16/2017 Date of Discharge: 06/03/2017  Attending Physician:MCCLUNG,JEFFREY T  Patient's JOI:NOMV, Odette Horns, MD  Consults: Stroke Team PCCM Gen Surgery   Disposition: D/C to SNF  Follow-up Appts: Ongoing care will be provided by the medical staff at the accepting SNF facility.   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 CT head revealed a large area of L MCA territory infarction. 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 He was unable to provide any history and on arrival in the intensive care unit his respirations were intermittently sonorous requiring intubation for airway protection.  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, R hemiplegia, and cerebral edema - secondary to Cocaine abuse, EtOH abuse and HTN - developed progressive midline shift to 71mm requiring hot salt w/ improvement on f/u CT head - has stabilized clinically but requires 24hr care - remains aphasic but has been cleared by SLP for a modified diet w/ assistance - to cont ASA 325 and Plavix as per Neuro     Hx of apical mural thrombus May 2017 F/U TTE Nov 2017 w/o thrombus - anticoag stopped Dec 2017 - no evidence of thrombus this admit   E coli UTI Pt was having fevers - urine culture revealed >100K E coli - to tx for 7 days total, w/ last day of treatment being 10/27 - pt DOES NOT have a foley in place   Coag neg Staph in 1 of 2 blood cx Fever explained by UTI - suspect this is a contaminant - no evidence of actual bacteremia   Severe encephalopathy waxing and waning - follow - avoid sedatives as able - no evidence of withdrawal at time of d/c - unclear at this time how much cognitive ability pt will recover   Cocaine abuse  Ongoing use will likely not occur due to severe deficits at this time   Chronic Systolic CHF EF 45-50% per TTE this admit w/ multiple areas of WMA - likely related to ischemia - medical management would be most appropriate at this time   HTN Long term BP goal 130-150 in setting of L ICA occlusion - presently dipping below goal at times - stopped BB - cont to follow   CAD S/p stenting on DAPT   DM2  10/6 A1c 6.1 - CBG controlled at time of d/c   Hypertriglyceridemia / HLD Cont Lipitor and Niacin  Hyponatremia Corrected w/ free water via PEG   Hypokalemia Corrected w/ cupplementation   Dysphagia Dysphagia 1, honey thick liquid as  per SLP eval 10/19   Allergies as of 06/03/2017      Reactions   Coconut Fatty Acids    unknown      Medication List    STOP taking these medications   aspirin 81 MG EC tablet Replaced by:  aspirin 325 MG tablet   buPROPion 150 MG 12 hr tablet Commonly known as:  WELLBUTRIN SR   cetirizine 10 MG tablet Commonly known as:  ZYRTEC   cyclobenzaprine 10 MG tablet Commonly known as:  FLEXERIL   famotidine 20 MG tablet Commonly known as:  PEPCID   Fish Oil 1000 MG Caps   fluticasone 50 MCG/ACT nasal spray Commonly known as:  FLONASE   hydrOXYzine 25 MG tablet Commonly known as:   ATARAX/VISTARIL   lisinopril 2.5 MG tablet Commonly known as:  PRINIVIL,ZESTRIL   metoprolol tartrate 25 MG tablet Commonly known as:  LOPRESSOR   NITROSTAT 0.4 MG SL tablet Generic drug:  nitroGLYCERIN   olopatadine 0.1 % ophthalmic solution Commonly known as:  PATANOL   sertraline 100 MG tablet Commonly known as:  ZOLOFT   tamsulosin 0.4 MG Caps capsule Commonly known as:  FLOMAX     TAKE these medications   acetaminophen 325 MG tablet Commonly known as:  TYLENOL Take 2 tablets (650 mg total) by mouth every 4 (four) hours as needed for mild pain (or temp > 37.5 C (99.5 F)). What changed:  medication strength  how much to take  when to take this  reasons to take this   acetaminophen 160 MG/5ML solution Commonly known as:  TYLENOL Place 20.3 mLs (650 mg total) into feeding tube every 4 (four) hours as needed for mild pain (or temp > 37.5 C (99.5 F)). What changed:  You were already taking a medication with the same name, and this prescription was added. Make sure you understand how and when to take each.   aspirin 325 MG tablet Place 1 tablet (325 mg total) into feeding tube daily. Replaces:  aspirin 81 MG EC tablet   atorvastatin 80 MG tablet Commonly known as:  LIPITOR Place 1 tablet (80 mg total) into feeding tube daily at 6 PM. What changed:  how much to take  how to take this  when to take this  additional instructions   cefUROXime 500 MG tablet Commonly known as:  CEFTIN Take 1 tablet (500 mg total) by mouth 2 (two) times daily with a meal.   clopidogrel 75 MG tablet Commonly known as:  PLAVIX Place 1 tablet (75 mg total) into feeding tube daily. What changed:  See the new instructions.   feeding supplement (JEVITY 1.2 CAL) Liqd Place 1,000 mLs into feeding tube continuous.   feeding supplement (PRO-STAT SUGAR FREE 64) Liqd Place 30 mLs into feeding tube 2 (two) times daily.   folic acid 1 MG tablet Commonly known as:  FOLVITE Place 1  tablet (1 mg total) into feeding tube daily.   insulin aspart 100 UNIT/ML injection Commonly known as:  novoLOG Inject 0-15 Units into the skin every 4 (four) hours.   multivitamin Tabs tablet Take 1 tablet by mouth daily.   niacin 500 MG tablet Take 1 tablet (500 mg total) by mouth daily.   pantoprazole 40 MG tablet Commonly known as:  PROTONIX Take 1 tablet (40 mg total) by mouth daily.   QUEtiapine 25 MG tablet Commonly known as:  SEROQUEL Take 1 tablet (25 mg total) by mouth at bedtime.   senna-docusate 8.6-50 MG tablet Commonly  known as:  Senokot-S Take 1 tablet by mouth at bedtime.   thiamine 100 MG tablet Take 1 tablet (100 mg total) by mouth daily.       Day of Discharge BP 94/83 (BP Location: Left Arm)   Pulse 99   Temp 98.3 F (36.8 C) (Axillary)   Resp (!) 21   Ht 5\' 10"  (1.778 m)   Wt 84.6 kg (186 lb 8.2 oz)   SpO2 95%   BMI 26.76 kg/m   Physical Exam: General: No acute respiratory distress Lungs: Clear to auscultation bilaterally without wheezes or crackles Cardiovascular: Regular rate and rhythm without murmur gallop or rub normal S1 and S2 Abdomen: Nontender, nondistended, soft, bowel sounds positive, no rebound, no ascites, no appreciable mass - PEG insertion clean and dry  Extremities: No significant cyanosis, clubbing, or edema bilateral lower extremities  Basic Metabolic Panel:  Recent Labs Lab 05/28/17 2345 05/29/17 0906  05/30/17 0644  05/31/17 0624 05/31/17 1237 06/01/17 0546 06/02/17 0457 06/03/17 0518  NA 133* 137  < > 137  < > 137 138 138 134* 139  K 3.2* 3.0*  --  3.1*  --  3.5  --  3.7 4.5 3.7  CL 104 105  --  104  --  105  --  104 103 99*  CO2 21* 21*  --  22  --  23  --  25 23 28   GLUCOSE 121* 102*  --  146*  --  144*  --  161* 150* 144*  BUN 13 16  --  20  --  20  --  18 17 19   CREATININE 0.68 0.81  --  0.72  --  0.72  --  0.67 0.66 0.75  CALCIUM 8.2* 8.5*  --  8.7*  --  8.5*  --  8.7* 8.4* 9.6  MG 2.0 2.0  --  1.9  --   1.9  --  1.9 1.9  --   PHOS 3.3  --   --  3.1  --  3.0  --  3.5 4.0  --   < > = values in this interval not displayed.  CBC:  Recent Labs Lab 05/28/17 0534 05/30/17 1212 06/01/17 0546  WBC 11.4* 9.7 8.4  NEUTROABS 8.4*  --   --   HGB 13.5 14.2 13.1  HCT 39.6 41.6 39.5  MCV 83.7 84.7 85.9  PLT 409* 432* 428*    CBG:  Recent Labs Lab 06/02/17 1914 06/02/17 2347 06/03/17 0327 06/03/17 0824 06/03/17 1151  GLUCAP 143* 159* 124* 176* 144*    Recent Results (from the past 240 hour(s))  Culture, blood (routine x 2)     Status: None (Preliminary result)   Collection Time: 05/30/17  2:55 PM  Result Value Ref Range Status   Specimen Description BLOOD LEFT FOREARM  Final   Special Requests   Final    BOTTLES DRAWN AEROBIC AND ANAEROBIC Blood Culture adequate volume   Culture NO GROWTH 3 DAYS  Final   Report Status PENDING  Incomplete  Culture, blood (routine x 2)     Status: Abnormal   Collection Time: 05/30/17  3:03 PM  Result Value Ref Range Status   Specimen Description BLOOD LEFT FOREARM  Final   Special Requests   Final    BOTTLES DRAWN AEROBIC AND ANAEROBIC Blood Culture adequate volume   Culture  Setup Time   Final    GRAM POSITIVE COCCI IN CLUSTERS AEROBIC BOTTLE ONLY CRITICAL RESULT CALLED TO, READ BACK  BY AND VERIFIED WITH: T. EGAN,PHARMD 0429 06/01/2017 T. TYSOR    Culture (A)  Final    STAPHYLOCOCCUS SPECIES (COAGULASE NEGATIVE) THE SIGNIFICANCE OF ISOLATING THIS ORGANISM FROM A SINGLE SET OF BLOOD CULTURES WHEN MULTIPLE SETS ARE DRAWN IS UNCERTAIN. PLEASE NOTIFY THE MICROBIOLOGY DEPARTMENT WITHIN ONE WEEK IF SPECIATION AND SENSITIVITIES ARE REQUIRED.    Report Status 06/02/2017 FINAL  Final  Blood Culture ID Panel (Reflexed)     Status: Abnormal   Collection Time: 05/30/17  3:03 PM  Result Value Ref Range Status   Enterococcus species NOT DETECTED NOT DETECTED Final   Listeria monocytogenes NOT DETECTED NOT DETECTED Final   Staphylococcus species  DETECTED (A) NOT DETECTED Final    Comment: Methicillin (oxacillin) susceptible coagulase negative staphylococcus. Possible blood culture contaminant (unless isolated from more than one blood culture draw or clinical case suggests pathogenicity). No antibiotic treatment is indicated for blood  culture contaminants. CRITICAL RESULT CALLED TO, READ BACK BY AND VERIFIED WITH: T. Larita Fife 1610 06/01/2017 T. TYSOR    Staphylococcus aureus NOT DETECTED NOT DETECTED Final   Methicillin resistance NOT DETECTED NOT DETECTED Final   Streptococcus species NOT DETECTED NOT DETECTED Final   Streptococcus agalactiae NOT DETECTED NOT DETECTED Final   Streptococcus pneumoniae NOT DETECTED NOT DETECTED Final   Streptococcus pyogenes NOT DETECTED NOT DETECTED Final   Acinetobacter baumannii NOT DETECTED NOT DETECTED Final   Enterobacteriaceae species NOT DETECTED NOT DETECTED Final   Enterobacter cloacae complex NOT DETECTED NOT DETECTED Final   Escherichia coli NOT DETECTED NOT DETECTED Final   Klebsiella oxytoca NOT DETECTED NOT DETECTED Final   Klebsiella pneumoniae NOT DETECTED NOT DETECTED Final   Proteus species NOT DETECTED NOT DETECTED Final   Serratia marcescens NOT DETECTED NOT DETECTED Final   Haemophilus influenzae NOT DETECTED NOT DETECTED Final   Neisseria meningitidis NOT DETECTED NOT DETECTED Final   Pseudomonas aeruginosa NOT DETECTED NOT DETECTED Final   Candida albicans NOT DETECTED NOT DETECTED Final   Candida glabrata NOT DETECTED NOT DETECTED Final   Candida krusei NOT DETECTED NOT DETECTED Final   Candida parapsilosis NOT DETECTED NOT DETECTED Final   Candida tropicalis NOT DETECTED NOT DETECTED Final  Culture, Urine     Status: Abnormal   Collection Time: 05/30/17  7:00 PM  Result Value Ref Range Status   Specimen Description URINE, RANDOM  Final   Special Requests NONE  Final   Culture >=100,000 COLONIES/mL ESCHERICHIA COLI (A)  Final   Report Status 06/02/2017 FINAL   Final   Organism ID, Bacteria ESCHERICHIA COLI (A)  Final      Susceptibility   Escherichia coli - MIC*    AMPICILLIN >=32 RESISTANT Resistant     CEFAZOLIN <=4 SENSITIVE Sensitive     CEFTRIAXONE <=1 SENSITIVE Sensitive     CIPROFLOXACIN <=0.25 SENSITIVE Sensitive     GENTAMICIN <=1 SENSITIVE Sensitive     IMIPENEM <=0.25 SENSITIVE Sensitive     NITROFURANTOIN <=16 SENSITIVE Sensitive     TRIMETH/SULFA <=20 SENSITIVE Sensitive     AMPICILLIN/SULBACTAM >=32 RESISTANT Resistant     PIP/TAZO <=4 SENSITIVE Sensitive     Extended ESBL NEGATIVE Sensitive     * >=100,000 COLONIES/mL ESCHERICHIA COLI     Time spent in discharge (includes decision making & examination of pt): 35+ minutes  06/03/2017, 12:07 PM   Lonia Blood, MD [facility] Office  [phone] Pager [phone]  On-Call/Text Page:      [email or web address]      password [facility]

## 2017-06-03 NOTE — Progress Notes (Signed)
Inpatient Rehabilitation  Note updated therapy team recommendations for SNF level of post acute rehab as well as plans for discharge today.  Will sign off at this time.    Charlane Ferretti., CCC/SLP Admission Coordinator  [facility]patient [facility]  Cell [phone]

## 2017-06-03 NOTE — Progress Notes (Signed)
CSW spoke with non-emergency dispatch for transportation. Per dispatch, patient was not on list for transportation however CSW noted confirmation that transportation was scheduled transportation for 2PM. CSW scheduled [facility] to pick patient up asap due to length of ride to [place], [place]. CSW confirmed with [facility] patient is able to arrive to facility any time tonight.   Stacy Gardner, [facility] Emergency Room Clinical Social Worker [phone]

## 2017-06-03 NOTE — Progress Notes (Signed)
CSW continues to follow for discharge needs.   Maxwel Meadowcroft S. [employer], MSW, LCSW-A Emergency Department Clinical Social Worker [phone] 

## 2017-06-03 NOTE — Progress Notes (Signed)
CSW has sent over discharge summary to [facility] at this time. Transport will pick pt up at 2pm. RN to call report to [phone]. CSW still unable to reach [place] or other family to inform them of discharge. There is no further CSW intervention needed. CSW signing off.      Claude Manges Jaeveon Ashland, MSW, LCSW-A Emergency Department Clinical Social Worker [phone]

## 2017-06-03 NOTE — Progress Notes (Signed)
Patient transported at this time by [facility] to SNF.

## 2017-06-04 LAB — CULTURE, BLOOD (ROUTINE X 2)
CULTURE: NO GROWTH
Special Requests: ADEQUATE

## 2017-06-28 IMAGING — NM NM MISC PROCEDURE
6 series · 36 of 36 positions shown · non-contrast
Comparison: none

[Series 1: wbr rest · 6.40mm/px · 6 of 64 frames shown]
[frame 6/64]
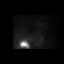
[frame 16/64]
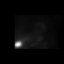
[frame 27/64]
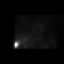
[frame 38/64]
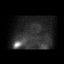
[frame 48/64]
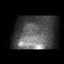
[frame 59/64]
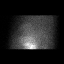

[Series 1: wbr_r-proj_st wbr rest · 6.40mm/px · 6 of 64 frames shown]
[frame 6/64]
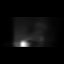
[frame 16/64]
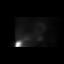
[frame 27/64]
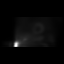
[frame 38/64]
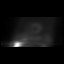
[frame 48/64]
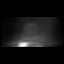
[frame 59/64]
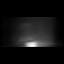

[Series 2: wbr_s-proj_st wbr stress-gsp · 6.40mm/px · 6 of 512 frames shown]
[frame 43/512]
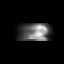
[frame 128/512]
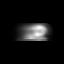
[frame 214/512]
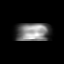
[frame 299/512]
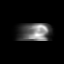
[frame 384/512]
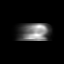
[frame 470/512]
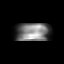

[Series 2: wbr stress-gsp · 6.40mm/px · 6 of 512 frames shown]
[frame 43/512]
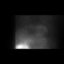
[frame 128/512]
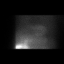
[frame 214/512]
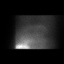
[frame 299/512]
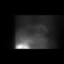
[frame 384/512]
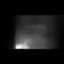
[frame 470/512]
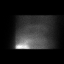

[Series 3: wbr_s-proj_st wbr stress-sum-em · 6.40mm/px · 6 of 64 frames shown]
[frame 6/64]
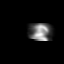
[frame 16/64]
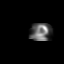
[frame 27/64]
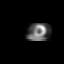
[frame 38/64]
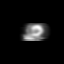
[frame 48/64]
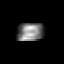
[frame 59/64]
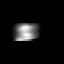

[Series 3: wbr stress-sum-em · 6.40mm/px · 6 of 64 frames shown]
[frame 6/64]
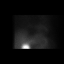
[frame 16/64]
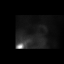
[frame 27/64]
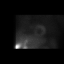
[frame 38/64]
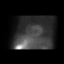
[frame 48/64]
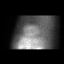
[frame 59/64]
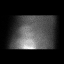

[36 of 36 positions shown; findings below may reference images not displayed]

Canned report from images found in remote index.

Refer to host system for actual result text.

## 2019-12-15 ENCOUNTER — Encounter: Payer: Self-pay | Admitting: General Practice

## 2020-12-26 ENCOUNTER — Ambulatory Visit (INDEPENDENT_AMBULATORY_CARE_PROVIDER_SITE_OTHER): Payer: Medicaid Other | Admitting: Cardiovascular Disease

## 2020-12-26 ENCOUNTER — Other Ambulatory Visit: Payer: Self-pay

## 2020-12-26 ENCOUNTER — Encounter: Payer: Self-pay | Admitting: Cardiovascular Disease

## 2020-12-26 VITALS — BP 107/64 | HR 73 | Wt 189.0 lb

## 2020-12-26 DIAGNOSIS — E785 Hyperlipidemia, unspecified: Secondary | ICD-10-CM | POA: Diagnosis not present

## 2020-12-26 DIAGNOSIS — I10 Essential (primary) hypertension: Secondary | ICD-10-CM | POA: Diagnosis not present

## 2020-12-26 DIAGNOSIS — I2510 Atherosclerotic heart disease of native coronary artery without angina pectoris: Secondary | ICD-10-CM

## 2020-12-26 DIAGNOSIS — Z9861 Coronary angioplasty status: Secondary | ICD-10-CM | POA: Diagnosis not present

## 2020-12-26 DIAGNOSIS — I639 Cerebral infarction, unspecified: Secondary | ICD-10-CM

## 2020-12-26 MED ORDER — ASPIRIN EC 81 MG PO TBEC: 81.0000 mg | DELAYED_RELEASE_TABLET | Freq: Every day | ORAL | 3 refills | Status: AC

## 2020-12-26 NOTE — Patient Instructions (Addendum)
Medication Instructions:  STOP taking aspirin 325mg , BEGIN aspirin 81mg .    *If you need a refill on your cardiac medications before your next appointment, please call your pharmacy*   Lab Work: CMet, CBC, TSH, Lipid, Hgb A1c to be drawn FASTING at your convenience.    Testing/Procedures: To be scheduled before next appointment with Dr. : Your physician has requested that you have an echocardiogram. Echocardiography is a painless test that uses sound waves to create images of your heart. It provides your doctor with information about the size and shape of your heart and how well your heart's chambers and valves are working. This procedure takes approximately one hour. There are no restrictions for this procedure.   Follow-Up: At [facility], you and your health needs are our priority.  As part of our continuing mission to provide you with exceptional heart care, we have created designated Provider Care Teams.  These Care Teams include your primary Cardiologist (physician) and Advanced Practice Providers (APPs -  Physician Assistants and Nurse Practitioners) who all work together to provide you with the care you need, when you need it.  We recommend signing up for the patient portal called "MyChart".  Sign up information is provided on this After Visit Summary.  MyChart is used to connect with patients for Virtual Visits (Telemedicine).  Patients are able to view lab/test results, encounter notes, upcoming appointments, etc.  Non-urgent messages can be sent to your provider as well.   To learn more about what you can do with MyChart, go to [facility].    Your next appointment:   3 month(s)  The format for your next appointment:   In Person  Provider:   [facility], MD
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
## 2021-01-29 ENCOUNTER — Other Ambulatory Visit: Payer: Self-pay

## 2021-01-29 ENCOUNTER — Ambulatory Visit (HOSPITAL_COMMUNITY): Payer: Medicaid Other | Attending: Cardiology

## 2021-01-29 ENCOUNTER — Telehealth: Payer: Self-pay | Admitting: Pharmacist Clinician (PhC)/ Clinical Pharmacy Specialist

## 2021-01-29 DIAGNOSIS — Z9861 Coronary angioplasty status: Secondary | ICD-10-CM | POA: Diagnosis not present

## 2021-01-29 DIAGNOSIS — I2510 Atherosclerotic heart disease of native coronary artery without angina pectoris: Secondary | ICD-10-CM

## 2021-01-29 LAB — ECHOCARDIOGRAM COMPLETE
Area-P 1/2: 3.47 cm2
S' Lateral: 4.05 cm

## 2021-01-29 MED ORDER — PERFLUTREN LIPID MICROSPHERE
1.0000 mL | INTRAVENOUS | Status: AC | PRN
Start: 2021-01-29 — End: 2021-01-29
  Administered 2021-01-29 (×2): 2 mL via INTRAVENOUS

## 2021-01-29 MED ORDER — WARFARIN SODIUM 5 MG PO TABS: ORAL_TABLET | ORAL | 3 refills | Status: AC

## 2021-01-29 MED ORDER — ENOXAPARIN SODIUM 80 MG/0.8ML IJ SOSY: 80.0000 mg | PREFILLED_SYRINGE | Freq: Two times a day (BID) | INTRAMUSCULAR | 0 refills | Status: AC

## 2021-01-29 NOTE — Telephone Encounter (Signed)
Started warfarin 5 mg daily, first INR Friday June 22.  INR goal 2.0-3.0  Started enoxaparin 80 mg q12h, continue until INR > 2.0  Orders faxed to [facility] [phone] [facility] has PA on staff to manage warfarin dosing.

## 2021-01-29 NOTE — Progress Notes (Unsigned)
Spoke with Darlina Guys at [facility].  (Fax [phone]).  They have in-house PA who monitors coumadin dosing/INR values for residents.  Faxed order to start warfarin 5 mg qd starting today, with INR goal of 2.0-3.0 and first INR draw on Friday.  Patient to also start enoxaparin 80 mg bid (CrCl 122.1) and continue until INR > 2.0

## 2021-03-21 ENCOUNTER — Ambulatory Visit (INDEPENDENT_AMBULATORY_CARE_PROVIDER_SITE_OTHER): Payer: Medicaid Other | Admitting: Podiatry

## 2021-03-21 ENCOUNTER — Other Ambulatory Visit: Payer: Self-pay

## 2021-03-21 DIAGNOSIS — M79675 Pain in left toe(s): Secondary | ICD-10-CM | POA: Diagnosis not present

## 2021-03-21 DIAGNOSIS — Z8673 Personal history of transient ischemic attack (TIA), and cerebral infarction without residual deficits: Secondary | ICD-10-CM | POA: Diagnosis not present

## 2021-03-21 DIAGNOSIS — M79674 Pain in right toe(s): Secondary | ICD-10-CM | POA: Diagnosis not present

## 2021-03-21 DIAGNOSIS — B351 Tinea unguium: Secondary | ICD-10-CM

## 2021-03-21 NOTE — Progress Notes (Signed)
   SUBJECTIVE Patient with a history of stroke presents to office today complaining of elongated, thickened nails that cause pain while ambulating in shoes.  Patient is unable to trim their own nails. Patient is here for further evaluation and treatment.   Past Medical History:  Diagnosis Date   Anxiety    Apical mural thrombus    BPH (benign prostatic hyperplasia)    Depression    Diverticulitis    Elevated cholesterol    Essential hypertension    Post traumatic stress disorder    STEMI (ST elevation myocardial infarction) (HCC)     OBJECTIVE General Patient is awake, alert, and oriented x 3 and in no acute distress. Derm Skin is dry and supple bilateral. Negative open lesions or macerations. Remaining integument unremarkable. Nails are tender, long, thickened and dystrophic with subungual debris, consistent with onychomycosis, 1-5 bilateral. No signs of infection noted. Vasc  DP and PT pedal pulses palpable bilaterally. Temperature gradient within normal limits.  Neuro Epicritic and protective threshold sensation diminished bilaterally.  Musculoskeletal Exam No symptomatic pedal deformities noted bilateral. Muscular strength within normal limits.  ASSESSMENT 1. History of stroke 2.  Pain due to onychomycosis of toenails both  PLAN OF CARE 1. Patient evaluated today. 2. Instructed to maintain good pedal hygiene and foot care. Stressed importance of controlling blood sugar.  3. Mechanical debridement of nails 1-5 bilaterally performed using a nail nipper. Filed with dremel without incident.  4. Return to clinic in 3 mos.     Felecia Shelling, DPM [facility]  Dr. Felecia Shelling, DPM    [address] [place], [place] [address]                Office [phone]  Fax [phone]

## 2021-04-06 ENCOUNTER — Ambulatory Visit (INDEPENDENT_AMBULATORY_CARE_PROVIDER_SITE_OTHER): Payer: Medicaid Other | Admitting: Cardiovascular Disease

## 2021-04-06 ENCOUNTER — Other Ambulatory Visit: Payer: Self-pay

## 2021-04-06 ENCOUNTER — Encounter: Payer: Self-pay | Admitting: Cardiovascular Disease

## 2021-04-06 VITALS — BP 120/70 | HR 56 | Wt 185.0 lb

## 2021-04-06 DIAGNOSIS — Z9861 Coronary angioplasty status: Secondary | ICD-10-CM

## 2021-04-06 DIAGNOSIS — I513 Intracardiac thrombosis, not elsewhere classified: Secondary | ICD-10-CM

## 2021-04-06 DIAGNOSIS — E785 Hyperlipidemia, unspecified: Secondary | ICD-10-CM | POA: Diagnosis not present

## 2021-04-06 DIAGNOSIS — I639 Cerebral infarction, unspecified: Secondary | ICD-10-CM

## 2021-04-06 DIAGNOSIS — I255 Ischemic cardiomyopathy: Secondary | ICD-10-CM | POA: Diagnosis not present

## 2021-04-06 DIAGNOSIS — I2510 Atherosclerotic heart disease of native coronary artery without angina pectoris: Secondary | ICD-10-CM

## 2021-04-06 NOTE — Progress Notes (Signed)
Heart and Vascular Care Navigation  04/06/2021  Henry David 05/20/1961 [id number]  Reason for Referral:  Engaged with patient face to face for initial visit for Heart and Vascular Care Coordination. Pt unable to answer questions appropriately, pt cousin speaks for pt during my visit.                                                                                                    Assessment:  LCSW received referral for pt from CMA. She shares that pt cousin present in room is expressing concerns about the facility pt lives in and making sure he is seeing care providers that he needs to see. LCSW met with pt and pt cousin in exam room. I introduced self, role, reason for visit. Pt unable to provide me with hx, his cousin Henry David is present. She states that she is his cousin and that he has two other cousins (her sisters) that she is estranged from. Following stroke in 2018 she had been unaware that pt was residing in [place]. She was able to locate pt at SNF and have him moved to facility in [place]. He currently resides at [facility]. She is his POA (was able to locate paperwork scanned in to his chart). She has had challenges getting things arranged for him- she cites example of having his toe nails clipped, getting what she feels are appropriate referrals for ongoing care and transportation. She states she works in insurance and feels pt could be getting more than he currently is; she is especially frustrated that she has been unable to get a copy of his Medicaid card.   LCSW inquired who pt has contacted already in the community regarding these concerns as there are several agencies that can serve as a mediator for those questions/concerns. Cousin states that she has called ombudsman before but does not know who she spoke with and states it was "about a month ago." She also is frustrated by lack of response from DSS when she calls. She is wondering what  her next steps should be at this time.                HRT/VAS Care Coordination     Patients Home Cardiology Office [facility]   Outpatient Care Team Social Worker   Living arrangements for the past 2 months Skilled Nursing Facility   Lives with: Facility Resident   Patient Current Insurance Coverage Medicaid   Patient Has Concern With Paying Medical Bills No   Does Patient Have Prescription Coverage? Yes   Home Assistive Devices/Equipment Wheelchair; Splint (specify type)  soft wrist splint noted   DME Agency NA   HH Agency NA       Social History:                                                                               SDOH Screenings   Alcohol Screen: Not on file  Depression (PHQ2-9): Not on file  Financial Resource Strain: Not on file  Food Insecurity: No Food Insecurity   Worried About Running Out of Food in the Last Year: Never true   Ran Out of Food in the Last Year: Never true  Housing: Low Risk    Last Housing Risk Score: 0  Physical Activity: Not on file  Social Connections: Not on file  Stress: Not on file  Tobacco Use: Medium Risk   Smoking Tobacco Use: Former   Smokeless Tobacco Use: Former  Transportation Needs: Not on file    SDOH Interventions: Financial Resources:    DSS for financial assistance  Food Insecurity:  Food Insecurity Interventions: Intervention Not Indicated  Housing Insecurity:  Housing Interventions: Intervention Not Indicated  Transportation:         Other Care Navigation Interventions:     Patient Referred to: [employer] Ombudsman, DSS, [employer] Complaints Line, [facility]   Follow-up plan:   LCSW provided support for pt cousin's frustration, I provided her again with the information for [employer] ombudsman and encouraged her to write down what her concerns were, who she speaks with and when she speaks with them. I provided [employer] complaint line for facilities if she continues to have concerns about pt referrals and ensuring his POC if  appropriate. In regards to getting a copy of his Medicaid card I encouraged her to go to DSS along with POA papers and ensure the address is updated and request a copy. If they are unable to do so, then I provided her with the number for the LME [facility] who also may be able to assist her with getting needed information. I also provided my contact information for any f/u questions/concerns. I have updated pt information as well in chart to reflect current address and and Henry's name/number.                    

## 2021-04-06 NOTE — Patient Instructions (Signed)
Medication Instructions:    RECOMMENDATION TO TAKE ANTICOAGULATION MEDICATION   WARAFARIN OR LOVENOX  *If you need a refill on your cardiac medications before your next appointment, please call your pharmacy*   Lab Work: NOT NEEDED   Testing/Procedures:  NOT NEEDED   Follow-Up: At [facility], you and your health needs are our priority.  As part of our continuing mission to provide you with exceptional heart care, we have created designated Provider Care Teams.  These Care Teams include your primary Cardiologist (physician) and Advanced Practice Providers (APPs -  Physician Assistants and Nurse Practitioners) who all work together to provide you with the care you need, when you need it.  We recommend signing up for the patient portal called "MyChart".  Sign up information is provided on this After Visit Summary.  MyChart is used to connect with patients for Virtual Visits (Telemedicine).  Patients are able to view lab/test results, encounter notes, upcoming appointments, etc.  Non-urgent messages can be sent to your provider as well.   To learn more about what you can do with MyChart, go to [email or web address].    Your next appointment:   6 month(s)  The format for your next appointment:   In Person  Provider:   Nicki Guadalajara, MD

## 2021-04-08 ENCOUNTER — Encounter: Payer: Self-pay | Admitting: Cardiovascular Disease
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
## 2021-04-13 ENCOUNTER — Telehealth: Payer: Self-pay

## 2021-04-13 NOTE — Telephone Encounter (Signed)
Records received from [facility] nursing - will have them scanned into chart for updated records for next upcoming visit.   Thanks!

## 2021-06-07 ENCOUNTER — Telehealth: Payer: Self-pay | Admitting: Licensed Clinical Social Worker

## 2021-06-07 NOTE — Telephone Encounter (Signed)
LCSW received a call from pt cousin Anthoula [phone]). She left voicemail on work cell and then called desk phone. She shares that she is still having challenges w/ getting pt the care she feels he needs. She states she has been to "4 lawyers, the omnibudsman, and DSS." She perseverates on that "he isn't incompetent. My doctor says all that needs to happen is to see the neurologist and for the social worker to do a fl2." She also states "nobody takes Medicaid," in regards to neurologists. She laments that his beard and hair are getting long and that he should be getting a hair cut and that the doctor at the SNF is "refusing to talk to her."    LCSW shared that unfortunately being that I do not work at the SNF I cannot speak to what is happening at [facility] and clarified that she needs to follow any advice given to her by the ombudsman  and DSS. If she feels things still aren't going right, then she needs to continue to file complaints. Advised her again that she needs to go to DSS to understand any benefits that his Medicaid entails and speak with the LTC benefits specialists. She has been provided the state Medicaid complaint line as well.    I clarified that capacity can be determined by a medical doctor, but that competency is a legal term and she would need to seek the advice of a lawyer or Legal Aid in regards to next steps if she is interested in getting clarity around that. I have called [facility] and the referral line confirmed that they do take Medicaid, I provided her with their number and the number for [facility]. I cautioned that they may require referral from the facility physician. I provided support for her advocacy and persistence.    Octavio Graves, MSW, LCSW [facility] Care Navigation  [phone]

## 2021-06-27 ENCOUNTER — Other Ambulatory Visit: Payer: Self-pay

## 2021-06-27 ENCOUNTER — Ambulatory Visit (INDEPENDENT_AMBULATORY_CARE_PROVIDER_SITE_OTHER): Payer: Medicaid Other | Admitting: Podiatry

## 2021-06-27 DIAGNOSIS — M79674 Pain in right toe(s): Secondary | ICD-10-CM

## 2021-06-27 DIAGNOSIS — M79675 Pain in left toe(s): Secondary | ICD-10-CM | POA: Diagnosis not present

## 2021-06-27 DIAGNOSIS — B351 Tinea unguium: Secondary | ICD-10-CM | POA: Diagnosis not present

## 2021-07-06 NOTE — Progress Notes (Signed)
   SUBJECTIVE Patient with a history of stroke presents to office today complaining of elongated, thickened nails that cause pain while ambulating in shoes.  Patient is unable to trim their own nails. Patient is here for further evaluation and treatment.   Past Medical History:  Diagnosis Date   Anxiety    Apical mural thrombus    BPH (benign prostatic hyperplasia)    Depression    Diverticulitis    Elevated cholesterol    Essential hypertension    Post traumatic stress disorder    STEMI (ST elevation myocardial infarction) (HCC)     OBJECTIVE General Patient is awake, alert, and oriented x 3 and in no acute distress. Derm Skin is dry and supple bilateral. Negative open lesions or macerations. Remaining integument unremarkable. Nails are tender, long, thickened and dystrophic with subungual debris, consistent with onychomycosis, 1-5 bilateral. No signs of infection noted. Vasc  DP and PT pedal pulses palpable bilaterally. Temperature gradient within normal limits.  Neuro Epicritic and protective threshold sensation diminished bilaterally.  Musculoskeletal Exam No symptomatic pedal deformities noted bilateral. Muscular strength within normal limits.  ASSESSMENT 1. History of stroke 2.  Pain due to onychomycosis of toenails both  PLAN OF CARE 1. Patient evaluated today. 2. Instructed to maintain good pedal hygiene and foot care. Stressed importance of controlling blood sugar.  3. Mechanical debridement of nails 1-5 bilaterally performed using a nail nipper. Filed with dremel without incident.  4.  Appointment for diabetic shoes and insoles  5.  Return to clinic in 3 mos.     Felecia Shelling, DPM [facility]  Dr. Felecia Shelling, DPM    [address] [place], [place] [address]                Office [phone]  Fax [phone]

## 2021-07-31 ENCOUNTER — Telehealth: Payer: Self-pay | Admitting: Podiatry

## 2021-07-31 NOTE — Telephone Encounter (Signed)
Pts power of attorney  called and they need a rx to go to [facility] for diabetic shoes and inserts (pt has medicaid) They were told to call at the end of the year for the prescription.  I returned call and explained that [facility] does not do diabetic shoes and inserts but I will include 2 locations that can do diabetic shoes with pts insurance. She asked if we could mail it to her @ [address] [place] [address].

## 2021-08-07 ENCOUNTER — Encounter: Payer: Self-pay | Admitting: Neurology

## 2021-08-07 ENCOUNTER — Ambulatory Visit (INDEPENDENT_AMBULATORY_CARE_PROVIDER_SITE_OTHER): Payer: Medicaid Other | Admitting: Neurology

## 2021-08-07 VITALS — BP 126/73 | HR 62 | Ht 70.0 in | Wt 185.0 lb

## 2021-08-07 DIAGNOSIS — G8191 Hemiplegia, unspecified affecting right dominant side: Secondary | ICD-10-CM | POA: Diagnosis not present

## 2021-08-07 DIAGNOSIS — I69398 Other sequelae of cerebral infarction: Secondary | ICD-10-CM | POA: Diagnosis not present

## 2021-08-07 DIAGNOSIS — R252 Cramp and spasm: Secondary | ICD-10-CM

## 2021-08-07 DIAGNOSIS — R4701 Aphasia: Secondary | ICD-10-CM | POA: Diagnosis not present

## 2021-08-07 DIAGNOSIS — I6930 Unspecified sequelae of cerebral infarction: Secondary | ICD-10-CM | POA: Diagnosis not present

## 2021-08-07 NOTE — Progress Notes (Signed)
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 Accompanied by cousin. NP/Paper/[facility]/severe aphasia. Pt's cousin would like to discuss physical therapy and speech therapy.    HISTORY OF PRESENT ILLNESS:  This is a 60 year old gentleman with past medical history of a left MCA stroke status post residual right hemiplegia and global aphasia, diabetes mellitus type 2, hypertension, hyperlipidemia and cardiac thrombus who is presenting to establish care.  History was obtained by cousin and also via chart review as patient is globally aphasic.  Per cousin patient was found down in 2018 home by his roommate.  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  He was not a tPA candidate at that time.  Per cousin after discharge patient was taken to rehab and then to a nursing home.  He has been in a living in a nursing home for the past 4 years.  Currently he is at [facility].  At baseline patient is aphasic, he can read and write to communicate at times.  He does have right side hemiplegia but is able to stand and able to ambulate with a device.  Currently he is not getting any therapy.     OTHER MEDICAL CONDITIONS: DMII, Hypertension, HLD, Cardiac thrombus    REVIEW OF SYSTEMS: Full 14 system review of systems performed and negative with exception of: unable to obtain due to severe aphasia   ALLERGIES: Allergies  Allergen Reactions   Coconut Fatty Acids     unknown    HOME MEDICATIONS: Outpatient Medications Prior to Visit  Medication Sig Dispense Refill   acetaminophen (TYLENOL) 160 MG/5ML  solution Place 20.3 mLs (650 mg total) into feeding tube every 4 (four) hours as needed for mild pain (or temp > 37.5 C (99.5 F)). 120 mL 0   acetaminophen (TYLENOL) 325 MG tablet Take 2 tablets (650 mg total) by mouth every 4 (four) hours as needed for mild pain (or temp > 37.5 C (99.5 F)).     Amino Acids-Protein Hydrolys (FEEDING SUPPLEMENT, PRO-STAT SUGAR FREE 64,) LIQD Place 30 mLs into feeding tube 2 (two) times daily. 900 mL 0   aspirin EC 81 MG tablet Take 1 tablet (81 mg total) by mouth daily. Swallow whole. 90 tablet 3   atorvastatin (LIPITOR) 40 MG tablet Take 40 mg by mouth daily.     baclofen (LIORESAL) 10 MG tablet Take by mouth.     cefUROXime (CEFTIN) 500 MG tablet Take 1 tablet (500 mg total) by mouth 2 (two) times daily with a meal.     doxycycline (VIBRA-TABS) 100 MG tablet Take 100 mg by mouth 2 (two) times daily.     enoxaparin (LOVENOX) 80 MG/0.8ML injection Inject 0.8 mLs (80 mg total) into the skin every 12 (twelve) hours. 0 mL 0   FAMOTIDINE ORIG ST 10 MG tablet Take 10 mg by mouth daily.     fluticasone (FLONASE) 50 MCG/ACT nasal spray Place into both nostrils.     folic acid (FOLVITE) 1 MG tablet Place 1 tablet (1 mg total) into feeding tube daily.     insulin aspart (NOVOLOG) 100 UNIT/ML injection Inject  0-15 Units into the skin every 4 (four) hours. 10 mL 11   melatonin 5 MG TABS Take 5 mg by mouth at bedtime.     mirtazapine (REMERON) 15 MG tablet Take 15 mg by mouth at bedtime.     multivitamin (PROSIGHT) TABS tablet Take 1 tablet by mouth daily. 30 each 0   mupirocin ointment (BACTROBAN) 2 % SMARTSIG:1 Application Topical 2-3 Times Daily     niacin 500 MG tablet Take 1 tablet (500 mg total) by mouth daily.     NUEDEXTA 20-10 MG capsule Take 1 capsule by mouth 2 (two) times daily.     Nutritional Supplements (FEEDING SUPPLEMENT, JEVITY 1.2 CAL,) LIQD Place 1,000 mLs into feeding tube continuous.  0   pantoprazole (PROTONIX) 40 MG tablet Take 1 tablet (40 mg total)  by mouth daily.     QUEtiapine (SEROQUEL) 25 MG tablet Take 1 tablet (25 mg total) by mouth at bedtime.     senna-docusate (SENOKOT-S) 8.6-50 MG tablet Take 1 tablet by mouth at bedtime.     sertraline (ZOLOFT) 100 MG tablet Take 1 tablet by mouth daily.     sertraline (ZOLOFT) 25 MG tablet Take 25 mg by mouth daily.     triamcinolone cream (KENALOG) 0.1 % Apply topically.     warfarin (COUMADIN) 5 MG tablet Start with 5 mg qd, dose to be determined by Joetta Manners protocol 90 tablet 3   No facility-administered medications prior to visit.    PAST MEDICAL HISTORY: Past Medical History:  Diagnosis Date   Anxiety    Apical mural thrombus    BPH (benign prostatic hyperplasia)    Depression    Diverticulitis    Elevated cholesterol    Essential hypertension    Post traumatic stress disorder    STEMI (ST elevation myocardial infarction) (HCC)     PAST SURGICAL HISTORY: Past Surgical History:  Procedure Laterality Date   CARDIAC CATHETERIZATION N/A 07/09/2015   Procedure: Left Heart Cath and Coronary Angiography;  Surgeon: Lennette Bihari, MD;  Location: [facility] INVASIVE CV LAB;  Service: Cardiovascular;  Laterality: N/A;   CARDIAC CATHETERIZATION N/A 07/09/2015   Procedure: Coronary Stent Intervention;  Surgeon: Lennette Bihari, MD;  Location: [facility] INVASIVE CV LAB;  Service: Cardiovascular;  Laterality: N/A;   CARDIAC CATHETERIZATION N/A 07/11/2015   Procedure: Coronary Stent Intervention;  Surgeon: Lennette Bihari, MD;  Location: [facility] INVASIVE CV LAB;  Service: Cardiovascular;  Laterality: N/A;   ESOPHAGOGASTRODUODENOSCOPY N/A 05/29/2017   Procedure: ESOPHAGOGASTRODUODENOSCOPY (EGD);  Surgeon: Jimmye Norman, MD;  Location: [facility] ENDOSCOPY;  Service: General;  Laterality: N/A;   KNEE SURGERY     PEG PLACEMENT N/A 05/29/2017   Procedure: PERCUTANEOUS ENDOSCOPIC GASTROSTOMY (PEG) PLACEMENT;  Surgeon: Jimmye Norman, MD;  Location: [facility] ENDOSCOPY;  Service: General;  Laterality: N/A;    FAMILY  HISTORY: Family History  Problem Relation Age of Onset   Heart disease Mother    Heart disease Father     SOCIAL HISTORY: Social History   Socioeconomic History   Marital status: Single    Spouse name: Not on file   Number of children: Not on file   Years of education: Not on file   Highest education level: Not on file  Occupational History   Not on file  Tobacco Use   Smoking status: Former    Packs/day: 0.00    Types: Cigarettes    Quit date: 07/04/2015    Years since quitting: 6.0   Smokeless tobacco: Former  Tobacco comments:    Nov. 27 2016  Substance and Sexual Activity   Alcohol use: Yes    Alcohol/week: 4.0 standard drinks    Types: 4 Cans of beer per week    Comment: occassional use   Drug use: No   Sexual activity: Not Currently  Other Topics Concern   Not on file  Social History Narrative   Not on file   Social Determinants of Health   Financial Resource Strain: Not on file  Food Insecurity: No Food Insecurity   Worried About Programme researcher, broadcasting/film/video in the Last Year: Never true   Ran Out of Food in the Last Year: Never true  Transportation Needs: Not on file  Physical Activity: Not on file  Stress: Not on file  Social Connections: Not on file  Intimate Partner Violence: Not on file    PHYSICAL EXAM  GENERAL EXAM/CONSTITUTIONAL: Vitals:  Vitals:   08/07/21 1403  BP: 126/73  Pulse: 62  Weight: 185 lb (83.9 kg)  Height: 5\' 10"  (1.778 m)   Body mass index is 26.54 kg/m. Wt Readings from Last 3 Encounters:  08/07/21 185 lb (83.9 kg)  04/06/21 185 lb (83.9 kg)  12/26/20 189 lb (85.7 kg)   Patient is in no distress; well developed, nourished and groomed; neck is supple, sitting in his wheelchair.   CARDIOVASCULAR: Examination of carotid arteries is normal; no carotid bruits Regular rate and rhythm, no murmurs Examination of peripheral vascular system by observation and palpation is normal  EYES: Pupils round and reactive to light, Visual  fields full to confrontation, Extraocular movements intacts,   MUSCULOSKELETAL: Gait, strength, tone, movements noted in Neurologic exam below  NEUROLOGIC: MENTAL STATUS:  No flowsheet data found. Globally aphasic  CRANIAL NERVE:  2nd, 3rd, 4th, 6th - pupils equal and reactive to light, visual fields full to confrontation, extraocular muscles intact, no nystagmus 5th - Decrease sensation to right side of face noted as in extinction to double stimulus 7th - facial strength symmetric 8th - hearing intact 9th - palate elevates symmetrically, uvula midline 11th - shoulder shrug symmetric 12th - tongue protrusion midline  MOTOR:  normal bulk and tone, full strength in the LUE and LLE. RUE is plegic, there is contraction at the shoulder and elbow. RLE is 4/5   SENSORY:  Extinction to double stimulus on the right.   COORDINATION:  Not perform due to aphasia  GAIT/STATION:  Deferred      DIAGNOSTIC DATA (LABS, IMAGING, TESTING) - I reviewed patient records, labs, notes, testing and imaging myself where available.  Lab Results  Component Value Date   WBC 8.4 06/01/2017   HGB 13.1 06/01/2017   HCT 39.5 06/01/2017   MCV 85.9 06/01/2017   PLT 428 (H) 06/01/2017      Component Value Date/Time   NA 139 06/03/2017 0518   NA 141 12/12/2016 1118   K 3.7 06/03/2017 0518   CL 99 (L) 06/03/2017 0518   CO2 28 06/03/2017 0518   GLUCOSE 144 (H) 06/03/2017 0518   BUN 19 06/03/2017 0518   BUN 14 12/12/2016 1118   CREATININE 0.75 06/03/2017 0518   CREATININE 1.05 03/08/2016 1116   CALCIUM 9.6 06/03/2017 0518   PROT 7.0 05/16/2017 1537   PROT 6.8 12/12/2016 1118   ALBUMIN 3.7 05/16/2017 1537   ALBUMIN 4.2 12/12/2016 1118   AST 32 05/16/2017 1537   ALT 36 05/16/2017 1537   ALKPHOS 87 05/16/2017 1537   BILITOT 0.6 05/16/2017 1537  BILITOT 0.3 12/12/2016 1118   GFRNONAA >60 06/03/2017 0518   GFRNONAA 79 03/08/2016 1116   GFRAA >60 06/03/2017 0518   GFRAA >89 03/08/2016 1116    Lab Results  Component Value Date   CHOL 186 05/17/2017   HDL 26 (L) 05/17/2017   LDLCALC UNABLE TO CALCULATE IF TRIGLYCERIDE OVER 400 mg/dL 76/81/1572   LDLDIRECT 103.1 05/22/2007   TRIG 532 (H) 05/17/2017   CHOLHDL 7.2 05/17/2017   Lab Results  Component Value Date   HGBA1C 6.1 (H) 05/17/2017   Lab Results  Component Value Date   VITAMINB12 440 05/22/2007   Lab Results  Component Value Date   TSH 2.27 05/22/2007    CT Head 05/26/2017 1. Continued interval evolution of large left MCA territory infarct, with decreasing mass effect and edema as compared to most recent CT from 05/24/2017. Left-to-right midline shift is improved now measuring 7 mm (previously 14 mm). 2. Grossly stable petechial hemorrhage as seen by previous MRI. No evidence for malignant hemorrhagic transformation. 3. No new intracranial abnormality   CTA Head and neck 05/16/2017 1. 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 2. No significant collateral flow to the left MCA territory. 3. Patent anterior communicating artery with retrograde filling of the left A1 segment. 4. Minimal atherosclerotic calcification within the cavernous right internal carotid artery. 5. No other significant atherosclerotic disease within the head or neck. 6. Degenerative changes of the cervical spine are most pronounced at C6-7    ASSESSMENT AND PLAN  60 y.o. year old male with past medical history of a left MCA stroke in 2018 with residual aphasia and right hemiplegia, diabetes mellitus type 2, hypertension, hyperlipidemia and cardiac thrombus currently on Coumadin who is presenting to establish care.  Patient stroke etiology was from a occluded left ICA.  Currently he is a resident of a nursing home for the past 4 years and family is interested in physical therapy.  I will refer the patient for physical, occupational and speech therapy.  He was also noted  to have spasticity, I will refer him for Botox injection.  He will continue current medications, no change and I will see him in 1 year for follow-up.   1. Chronic ischemic left MCA stroke   2. Right hemiplegia (HCC)   3. Global aphasia   4. Spasticity as late effect of cerebrovascular accident (CVA)      PLAN: Patient Instructions  Head CT  Referral to physical, occupational and speech therapy  Continue all other medications  Follow up in 1 year    Orders Placed This Encounter  Procedures   CT HEAD WO CONTRAST ( )   Ambulatory referral to Physical Therapy   Ambulatory referral to Occupational Therapy   Ambulatory referral to Speech Therapy   Ambulatory referral to Neurology    No orders of the defined types were placed in this encounter.   Return in about 1 year (around 08/07/2022).    Windell Norfolk, MD 08/07/2021, 9:50 PM  [facility] [address] [place], [place] [address] [phone]

## 2021-08-07 NOTE — Patient Instructions (Signed)
Head CT  Referral to physical, occupational and speech therapy  Continue all other medications  Follow up in 1 year

## 2021-08-14 ENCOUNTER — Telehealth: Payer: Self-pay | Admitting: Neurology

## 2021-08-14 NOTE — Telephone Encounter (Signed)
Patient was seen by Dr. Teresa Coombs 12/27 and referred to Dr. Terrace Arabia for Xeomin injections. I received signed patient consent (given to records) and charge sheet requesting 600 units of Xeomin (300 units at 1st injection) for dx I69.351. CPT: A5567536, [id number], [id number], [id number], [id number], [id number], [id number], and 58832. Patient currently has Medicaid. I filled out PA form and placed in nurse pod for MD signature.

## 2021-08-14 NOTE — Telephone Encounter (Signed)
Faxed signed PA form to Medicaid. Phone: [phone].

## 2021-08-15 NOTE — Telephone Encounter (Signed)
Received approval from Medicaid. PA #[id number] (08/14/21- 08/09/22).  I called patient and LVM regarding scheduling.

## 2021-08-20 ENCOUNTER — Inpatient Hospital Stay: Admission: RE | Admit: 2021-08-20 | Payer: Medicaid Other | Source: Ambulatory Visit

## 2021-08-22 ENCOUNTER — Ambulatory Visit: Payer: Medicaid Other | Attending: Neurology | Admitting: Speech Pathology

## 2021-08-22 ENCOUNTER — Ambulatory Visit: Payer: Medicaid Other | Admitting: Physical Therapy

## 2021-08-22 ENCOUNTER — Ambulatory Visit: Payer: Medicaid Other | Admitting: Occupational Therapy

## 2021-08-29 ENCOUNTER — Telehealth: Payer: Self-pay | Admitting: Neurology

## 2021-08-29 NOTE — Telephone Encounter (Signed)
Pt's POA Contogiannis,Anthoula called needing to discuss providers orders with the RN. Please advise.

## 2021-08-29 NOTE — Telephone Encounter (Signed)
I spoke to the patient's cousin on [place]. Says she is having issues with the patient's current long term care facility. He missed his appt for his CT Head and PT/OT/Speech therapy. She has the phone number to those offices and will contact them to reschedule. He will keep his pending appt in March for a consultation w/ Dr. Terrace Arabia for Xeomin/Botox injections. He already has his yearly appt scheduled w/ Dr. Teresa Coombs.

## 2021-09-07 ENCOUNTER — Telehealth: Payer: Self-pay | Admitting: Neurology

## 2021-09-07 NOTE — Telephone Encounter (Signed)
I returned call from patient's power of attorney to the [facility] answering service requesting clarification on patient's next follow-up appointment time and date with Dr. Terrace Arabia.  Reviewed the chart and informed her that appointment was on March 9 at 2:30 PM when to arrive half an hour earlier.  She voiced understanding.

## 2021-09-19 ENCOUNTER — Ambulatory Visit: Payer: Medicaid Other | Admitting: Cardiovascular Disease

## 2021-09-28 ENCOUNTER — Other Ambulatory Visit: Payer: Medicaid Other

## 2021-10-03 ENCOUNTER — Ambulatory Visit (INDEPENDENT_AMBULATORY_CARE_PROVIDER_SITE_OTHER): Payer: Medicaid Other | Admitting: Podiatry

## 2021-10-03 ENCOUNTER — Other Ambulatory Visit: Payer: Self-pay

## 2021-10-03 DIAGNOSIS — B351 Tinea unguium: Secondary | ICD-10-CM

## 2021-10-03 DIAGNOSIS — M79674 Pain in right toe(s): Secondary | ICD-10-CM

## 2021-10-03 DIAGNOSIS — M79675 Pain in left toe(s): Secondary | ICD-10-CM | POA: Diagnosis not present

## 2021-10-03 NOTE — Progress Notes (Signed)
° °  SUBJECTIVE Patient with a history of stroke presents to office today complaining of elongated, thickened nails that cause pain while ambulating in shoes.  Patient is unable to trim their own nails. Patient is here for further evaluation and treatment.   Past Medical History:  Diagnosis Date   Anxiety    Apical mural thrombus    BPH (benign prostatic hyperplasia)    Depression    Diverticulitis    Elevated cholesterol    Essential hypertension    Post traumatic stress disorder    STEMI (ST elevation myocardial infarction) (HCC)     OBJECTIVE General Patient is awake, alert, and oriented x 3 and in no acute distress. Derm Skin is dry and supple bilateral. Negative open lesions or macerations. Remaining integument unremarkable. Nails are tender, long, thickened and dystrophic with subungual debris, consistent with onychomycosis, 1-5 bilateral. No signs of infection noted. Vasc  DP and PT pedal pulses palpable bilaterally. Temperature gradient within normal limits.  Neuro Epicritic and protective threshold sensation diminished bilaterally.  Musculoskeletal Exam No symptomatic pedal deformities noted bilateral. Muscular strength within normal limits.  ASSESSMENT 1. History of stroke 2.  Pain due to onychomycosis of toenails both  PLAN OF CARE 1. Patient evaluated today. 2. Instructed to maintain good pedal hygiene and foot care. Stressed importance of controlling blood sugar.  3. Mechanical debridement of nails 1-5 bilaterally performed using a nail nipper. Filed with dremel without incident.  4.  Appointment for diabetic shoes and insoles  5.  Return to clinic in 3 mos.     Pritika Alvarez M. Aleeya Veitch, DPM [facility]  Dr. Emonee Winkowski M. Grettell Ransdell, DPM    [address]                                      [place], [place] [address]                Office [phone]  Fax [phone]      

## 2021-10-04 ENCOUNTER — Encounter: Payer: Medicaid Other | Admitting: Speech Pathology

## 2021-10-04 ENCOUNTER — Ambulatory Visit: Payer: Medicaid Other | Admitting: Physical Therapy

## 2021-10-04 ENCOUNTER — Ambulatory Visit: Payer: Medicaid Other | Admitting: Occupational Therapy

## 2021-10-18 ENCOUNTER — Institutional Professional Consult (permissible substitution): Payer: Medicaid Other | Admitting: Neurology

## 2021-10-18 ENCOUNTER — Telehealth: Payer: Self-pay | Admitting: Cardiovascular Disease

## 2021-10-18 NOTE — Telephone Encounter (Signed)
Please fax order to nursing home if med switch is approved.. ? ? ?Fax # [phone] ?

## 2021-10-18 NOTE — Telephone Encounter (Signed)
There is growing evidence that using Eliquis or Xarelto for treating mural thrombus is non inferior to warfarin but there have not been large controlled trials confirming it yet.  However, since patient is currently refusing warfarin and INR checks, it may be his only option for anticoagulation.  Will route to Dr Tresa Endo for his input. ?

## 2021-10-18 NOTE — Telephone Encounter (Signed)
Nursing home states that pt is not taking the coumadin and will not allow staff to check his INR... would like to know if Eliquis is an option for the patient... please advise  ?

## 2021-10-29 ENCOUNTER — Telehealth: Payer: Self-pay | Admitting: Licensed Clinical Social Worker

## 2021-10-29 LAB — HEMOGLOBIN A1C
Est. average glucose Bld gHb Est-mCnc: 120 mg/dL
Hgb A1c MFr Bld: 5.8 % — ABNORMAL HIGH (ref 4.8–5.6)

## 2021-10-29 NOTE — Telephone Encounter (Signed)
Pt present in office for lab draw today.  ?Pt cousin Anthoula accompanies him today- she shared concerns about medication management at SNF. Thinks what he should be taking is different than what he is taking. She shared she has information she'd like to report to the state. She has previously been provided with this information by this Probation officer but it was printed and provided again. She was given the following: ?Number for [facility] located at [facility] [phone] ), I also provided the state report line  ? ?By Phone ?Complaint Hotline: [phone] (within [facility].) or [phone] ?Complaint Hotline Hours: 9:00 a.m. - 12:00 p.m. and 1:00 p.m. - 4:00 p.m. weekdays, except holidays. ? ?By Fax ?Please fax your information to [phone] ? ?By Mail ?Mail complaints to: ?Complaint Intake Unit ?[place] ?[place], [place] [address] ? ?Encouraged her to contact them with any information she has. Also encouraged her to during pt next visit speak with Dr. Claiborne Billings about having a staff member contact SNF to double check on medication management.  ? ?Westley Hummer, MSW, LCSW ?Clinical Social Worker II ?[facility]  ?[phone]- work cell phone (preferred) ?[phone]- desk phone ? ?

## 2021-10-30 LAB — COMPREHENSIVE METABOLIC PANEL
ALT: 16 IU/L (ref 0–44)
AST: 22 IU/L (ref 0–40)
Albumin/Globulin Ratio: 1.6 (ref 1.2–2.2)
Albumin: 4.4 g/dL (ref 3.8–4.8)
Alkaline Phosphatase: 93 IU/L (ref 44–121)
BUN/Creatinine Ratio: 16 (ref 10–24)
BUN: 13 mg/dL (ref 8–27)
Bilirubin Total: 0.7 mg/dL (ref 0.0–1.2)
CO2: 20 mmol/L (ref 20–29)
Calcium: 8.9 mg/dL (ref 8.6–10.2)
Chloride: 104 mmol/L (ref 96–106)
Creatinine, Ser: 0.8 mg/dL (ref 0.76–1.27)
Globulin, Total: 2.7 g/dL (ref 1.5–4.5)
Glucose: 83 mg/dL (ref 70–99)
Potassium: 4.4 mmol/L (ref 3.5–5.2)
Sodium: 141 mmol/L (ref 134–144)
Total Protein: 7.1 g/dL (ref 6.0–8.5)
eGFR: 100 mL/min/{1.73_m2} (ref >59)

## 2021-10-30 LAB — LIPID PANEL
Chol/HDL Ratio: 3.9 ratio (ref 0.0–5.0)
Cholesterol, Total: 131 mg/dL (ref 100–199)
HDL: 34 mg/dL — ABNORMAL LOW (ref >39)
LDL Chol Calc (NIH): 77 mg/dL (ref 0–99)
Triglycerides: 105 mg/dL (ref 0–149)
VLDL Cholesterol Cal: 20 mg/dL (ref 5–40)

## 2021-10-30 LAB — CBC
Hematocrit: 44.5 % (ref 37.5–51.0)
Hemoglobin: 15.3 g/dL (ref 13.0–17.7)
MCH: 29.9 pg (ref 26.6–33.0)
MCHC: 34.4 g/dL (ref 31.5–35.7)
MCV: 87 fL (ref 79–97)
Platelets: 261 10*3/uL (ref 150–450)
RBC: 5.12 x10E6/uL (ref 4.14–5.80)
RDW: 12.5 % (ref 11.6–15.4)
WBC: 5.7 10*3/uL (ref 3.4–10.8)

## 2021-10-30 LAB — TSH: TSH: 2.6 u[IU]/mL (ref 0.450–4.500)

## 2021-10-30 NOTE — Telephone Encounter (Signed)
Patient has appointment on 03/24 with Dr.Kelly, will discuss at this visit.  ? ?

## 2021-10-30 NOTE — Telephone Encounter (Signed)
Agreeable discussed with patient at office visit ?

## 2021-11-02 ENCOUNTER — Encounter: Payer: Self-pay | Admitting: Cardiovascular Disease

## 2021-11-02 ENCOUNTER — Ambulatory Visit (INDEPENDENT_AMBULATORY_CARE_PROVIDER_SITE_OTHER): Payer: Medicaid Other | Admitting: Cardiovascular Disease

## 2021-11-02 ENCOUNTER — Other Ambulatory Visit: Payer: Self-pay

## 2021-11-02 VITALS — BP 100/60 | HR 64 | Ht 70.0 in | Wt 193.2 lb

## 2021-11-02 DIAGNOSIS — I513 Intracardiac thrombosis, not elsewhere classified: Secondary | ICD-10-CM

## 2021-11-02 DIAGNOSIS — I639 Cerebral infarction, unspecified: Secondary | ICD-10-CM

## 2021-11-02 DIAGNOSIS — E785 Hyperlipidemia, unspecified: Secondary | ICD-10-CM

## 2021-11-02 DIAGNOSIS — I2510 Atherosclerotic heart disease of native coronary artery without angina pectoris: Secondary | ICD-10-CM

## 2021-11-02 DIAGNOSIS — I255 Ischemic cardiomyopathy: Secondary | ICD-10-CM

## 2021-11-02 DIAGNOSIS — I2102 ST elevation (STEMI) myocardial infarction involving left anterior descending coronary artery: Secondary | ICD-10-CM

## 2021-11-02 DIAGNOSIS — Z9861 Coronary angioplasty status: Secondary | ICD-10-CM

## 2021-11-02 DIAGNOSIS — Z7901 Long term (current) use of anticoagulants: Secondary | ICD-10-CM

## 2021-11-02 NOTE — Progress Notes (Signed)
Patient ID: Henry David, male   DOB: 1961-04-29, 61 y.o.   MRN: [id number] ? ? ? ? ? ?PCP: Dr. Arnoldo Morale ? ? ?7 month F/U evaluation ? ?HPI: Henry David is a 61 y.o. male resides at [facility].  He presents to the office today brought in by his cousin, Henry David to reestablish cardiology care. ? ?Henry David has a history of anxiety , depression and stress disorder.  He had not seen a physician in years in the past was told of having high cholesterol but never received treatment. On July 09, 2015 he developed new onset chest pain leading to a [facility] emergency room presentation.  His ECG several hours after presentation revealed anterolateral ST elevation and he was transported to [facility] for emergent catheterization and PCI. Catheterization was performed by me, which revealed mild acute LV dysfunction with mid anterolateral hypocontractility and an EF of 45-50%.  There was significant multivessel CAD with the LAD giving rise to a bifurcating diagonal vessel with tubular narrowing of 75-80% and after the takeoff of the proximal ulcerative perforating artery the LAD had a 95% napkin ring stenosis followed by 60% stenosis in its midsegment. The left circumflex coronary artery was felt to have an 80% eccentric narrowing in the first marginal branch. The RCA was a large dominant vessel with diffuse 80% proximal stenosis.  He underwent successful intervention with PTCA stenting of the mid LAD with insertion of a [id number] mm Xience Alpine stent postdilated to 2.97 mm in the 100% occlusion reduced to 0%, and PTCA of his diagonal stenosis being reduced to approximate 5%. 2 days later he underwent repeat catheterization which showed patent intervention sites.  His circumflex marginal stenosis had improved and now only appeared 30%. His RCA stenosis appeared 90% and he underwent successful stenting of the RCA  with insertion of a 4.0?18 mm Xience Alpine DES stent postdilated to  4.51 mm with the stenoses being reduced to 0% in the staged procedure.  ? ?When I saw him in February 2017 he remained stable without recurrent chest pain development.  He was on  aspirin and Brilinta 90 mg twice a day for dual antiplatelet therapy.  He has been on metoprolol, tartrate 25 mg twice a day and low-dose lisinopril 2.5 mg in the morning for CAD.  He has had issues with low blood pressure and had been off lisinopril.  I suggested since his blood pressure was stable of rechallenge of low-dose therapy..  For his anxiety. He was on Wellbutrin 150 mg twice a day.  He was tolerating atorvastatin 80 mg for his hyperlipidemia.  He denied myalgias.   ? ?An echo study on 12/26/2015 showed an EF of 40-45%.  There was akinesis of the anteroseptal and apical myocardium and he had grade 1 diastolic dysfunction.  Aortic root was mildly dilated.  There appeared to be an apical thrombus on definity evaluation. ? ?He was hospitalized on 12/29/2015 with palpitations and increased anxiety.  During that evaluation, he was started on Coumadin in light of his apical thrombus. When I saw him in follow-up, since he was started on Coumadin, I discontinued Brilinta and started Plavix.   ? ?He underwent a follow-up echo Doppler study on 06/24/2016 with Definity contrast.  EF was 40-45%.  There was akinesis of the mid apical, anteroseptal and anterior wall.  He had grade 1 diastolic dysfunction.  He was no evidence for any thrombus on contrast evaluation.  There was trivial TR.  He also underwent a one-year follow-up nuclear perfusion study which showed an EF of 41%.  There was evidence for his prior scar in the mid anterior, anteroseptal and apical segment.  There was no associated ischemia.  Review of his medications reveals that he has been taking triple drug therapy with aspirin, Plavix and warfarin.  I suggested discontinuance of warfarin and that he continue on aspirin and Plavix indefinitely.  He underwent a follow-up echo  Doppler study on 12/18/2016 which showed an EF of 40-45% with akinesis of the mid anterior apical myocardium.  There was grade 2 diastolic dysfunction.  He denies any significant chest pain.  At times there has been a rare chest tightness which is nonexertional.  He admits to mild shortness of breath with activity.  I last saw him in May 2018. ? 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  He developed midline shift requiring hot salt with improvement and on follow-up CT of his head he had clinically stabilized but required subsequent 24-hour care.  He apparently was discharged to [facility]. ? ?I saw him in May 2022 after not having seen him since 2018.  At that time, the patient was residing in [place]   Both parents are deceased.  He has been followed by his cousin Henry David who is his power of attorney.  She arranged for him to get a reevaluation and scheduled him for this appointment today.  Patient has had continued aphasia and it is difficult to ascertain a clinical history.  Apparently, approximately 1-1/2 years ago he stopped taking the clopidogrel after he had developed a bleed in his arm.  When I saw him it appeared that he was still on  full dose aspirin 325 mg, atorvastatin 40 mg insulin, folic acid, niacin, melatonin, and mirtazapine 15 mg at bedtime which are administered at [facility].  During that evaluation, I recommended that he undergo a 2D echo Doppler study for reassessment of LV function.  He is followed by Dr. Delfina Redwood who is the physician at his nursing home.  I recommended he change his aspirin to 81 mg.  I scheduled him for follow-up echo Doppler study for reassessment of LV function and potential thrombus. ? ?He underwent an echo Doppler study on January 29, 2021.  This showed reduced LV function with EF at 35 to 40% with  wall motion abnormalities consistent with his prior MI.  He did have an LV apical thrombus measuring 3.2 cm long by 1.6 cm wide.  There was mild aortic sclerosis.  Subsequently, it was recommended that he initiate warfarin therapy. ? ?When I saw him in August 2022 he continues to reside at [place].  There was issue regarding whether or not he was on warfarin or Lovenox.  There was concern that the patient had refused treatment.  At that time I spent considerable time discussing the importance of resumption of anticoagulation particularly with his apical thrombus.   ? ?Presently, he continues to resolve at [facility].  Dr. Maudry Mayhew is the provider.  According to the [facility] records, he is currently taking warfarin 5 mg on Monday Wednesday and Friday and 4 mg on Tuesday Thursday Saturday and Sunday.  I do not have any data regarding his prothrombin times.  He recently had laboratory on October 29, 2021 which showed a total cholesterol  131 HDL 34 LDL 77 triglycerides 105.  Hemoglobin A1c was 5.8.  Creatinine was 0.8 and potassium 4.4.  TSH was normal at 2.6.  He denies any chest pain or shortness of breath.  He is unaware of palpitations.  He is here now in the office with his cousin presents for follow-up evaluation. ? ? ?Past Medical History:  ?Diagnosis Date  ? Anxiety   ? Apical mural thrombus   ? BPH (benign prostatic hyperplasia)   ? Depression   ? Diverticulitis   ? Elevated cholesterol   ? Essential hypertension   ? Post traumatic stress disorder   ? STEMI (ST elevation myocardial infarction) ([place])   ? ? ?Past Surgical History:  ?Procedure Laterality Date  ? CARDIAC CATHETERIZATION N/A 07/09/2015  ? Procedure: Left Heart Cath and Coronary Angiography;  Surgeon: Troy Sine, MD;  Location: [place] [facility];  Service: Cardiovascular;  Laterality: N/A;  ? CARDIAC CATHETERIZATION N/A 07/09/2015  ? Procedure: Coronary Stent Intervention;  Surgeon: Troy Sine, MD;  Location: [place] [facility];   Service: Cardiovascular;  Laterality: N/A;  ? CARDIAC CATHETERIZATION N/A 07/11/2015  ? Procedure: Coronary Stent Intervention;  Surgeon: Troy Sine, MD;  Location: [place] [facility];  Service: Cardiova

## 2021-11-02 NOTE — Patient Instructions (Addendum)
Medication Instructions:  ?The current medical regimen is effective;  continue present plan and medications as directed. Please refer to the Current Medication list given to you today.  ? ?*If you need a refill on your cardiac medications before your next appointment, please call your pharmacy* ? ?Lab Work:   Testing/Procedures:  ?NONE    NONE ? ?Special Instructions ?Echocardiogram - Your physician has requested that you have an echocardiogram. Echocardiography is a painless test that uses sound waves to create images of your heart. It provides your doctor with information about the size and shape of your heart and how well your heart?s chambers and valves are working. This procedure takes approximately one hour. There are no restrictions for this procedure. This will be performed at either our [facility] location - [address] -or- [facility] location [facility] 2nd floor. SOMEONE WILL BE CALLING YOU TO SCHEDULE THIS APPOINTMENT ? ?Follow-Up: ?Your next appointment:  6 month(s) In Person with Nicki Guadalajara, MD   ? ?Please call our office 2 months in advance to schedule this appointment  ? ?At [facility], you and your health needs are our priority.  As part of our continuing mission to provide you with exceptional heart care, we have created designated Provider Care Teams.  These Care Teams include your primary Cardiologist (physician) and Advanced Practice Providers (APPs -  Physician Assistants and Nurse Practitioners) who all work together to provide you with the care you need, when you need it. ? ?We recommend signing up for the patient portal called "MyChart".  Sign up information is provided on this After Visit Summary.  MyChart is used to connect with patients for Virtual Visits (Telemedicine).  Patients are able to view lab/test results, encounter notes, upcoming appointments, etc.  Non-urgent messages can be sent to your provider as well.   ?To learn more about what you can  do with MyChart, go to [email or web address].   ? ? ?

## 2021-11-29 ENCOUNTER — Ambulatory Visit (HOSPITAL_COMMUNITY): Payer: Medicaid Other | Attending: Cardiology

## 2021-11-29 DIAGNOSIS — I513 Intracardiac thrombosis, not elsewhere classified: Secondary | ICD-10-CM

## 2021-11-29 LAB — ECHOCARDIOGRAM COMPLETE
Area-P 1/2: 3.39 cm2
S' Lateral: 2 cm

## 2021-11-29 MED ORDER — PERFLUTREN LIPID MICROSPHERE
1.0000 mL | INTRAVENOUS | Status: AC | PRN
  Administered 2021-11-29: 2 mL via INTRAVENOUS

## 2021-12-01 ENCOUNTER — Emergency Department (HOSPITAL_COMMUNITY): Payer: Medicaid Other

## 2021-12-01 ENCOUNTER — Other Ambulatory Visit: Payer: Self-pay

## 2021-12-01 ENCOUNTER — Encounter (HOSPITAL_COMMUNITY): Payer: Self-pay | Admitting: Emergency Medicine

## 2021-12-01 ENCOUNTER — Emergency Department (HOSPITAL_COMMUNITY)
Admission: EM | Admit: 2021-12-01 | Discharge: 2021-12-01 | Disposition: A | Discharge status: Skilled Nursing Facility | Payer: Medicaid Other | Attending: Emergency Medicine | Admitting: Emergency Medicine

## 2021-12-01 DIAGNOSIS — S0990XA Unspecified injury of head, initial encounter: Secondary | ICD-10-CM | POA: Insufficient documentation

## 2021-12-01 DIAGNOSIS — Z794 Long term (current) use of insulin: Secondary | ICD-10-CM | POA: Insufficient documentation

## 2021-12-01 DIAGNOSIS — Z23 Encounter for immunization: Secondary | ICD-10-CM | POA: Diagnosis not present

## 2021-12-01 DIAGNOSIS — S0081XA Abrasion of other part of head, initial encounter: Secondary | ICD-10-CM

## 2021-12-01 DIAGNOSIS — W01198A Fall on same level from slipping, tripping and stumbling with subsequent striking against other object, initial encounter: Secondary | ICD-10-CM | POA: Insufficient documentation

## 2021-12-01 DIAGNOSIS — S0093XA Contusion of unspecified part of head, initial encounter: Secondary | ICD-10-CM

## 2021-12-01 DIAGNOSIS — Z7901 Long term (current) use of anticoagulants: Secondary | ICD-10-CM | POA: Diagnosis not present

## 2021-12-01 DIAGNOSIS — S60221A Contusion of right hand, initial encounter: Secondary | ICD-10-CM

## 2021-12-01 DIAGNOSIS — W010XXA Fall on same level from slipping, tripping and stumbling without subsequent striking against object, initial encounter: Secondary | ICD-10-CM

## 2021-12-01 DIAGNOSIS — S01102A Unspecified open wound of left eyelid and periocular area, initial encounter: Secondary | ICD-10-CM | POA: Diagnosis not present

## 2021-12-01 DIAGNOSIS — S0083XA Contusion of other part of head, initial encounter: Secondary | ICD-10-CM

## 2021-12-01 DIAGNOSIS — R399 Unspecified symptoms and signs involving the genitourinary system: Secondary | ICD-10-CM

## 2021-12-01 DIAGNOSIS — S0993XA Unspecified injury of face, initial encounter: Secondary | ICD-10-CM | POA: Diagnosis present

## 2021-12-01 DIAGNOSIS — R300 Dysuria: Secondary | ICD-10-CM | POA: Insufficient documentation

## 2021-12-01 LAB — URINALYSIS, ROUTINE W REFLEX MICROSCOPIC
Bilirubin Urine: NEGATIVE
Glucose, UA: NEGATIVE mg/dL
Hgb urine dipstick: NEGATIVE
Ketones, ur: NEGATIVE mg/dL
Leukocytes,Ua: NEGATIVE
Nitrite: NEGATIVE
Protein, ur: NEGATIVE mg/dL
Specific Gravity, Urine: 1.02 (ref 1.005–1.030)
pH: 5 (ref 5.0–8.0)

## 2021-12-01 LAB — PROTIME-INR
INR: 1 (ref 0.8–1.2)
Prothrombin Time: 13.5 seconds (ref 11.4–15.2)

## 2021-12-01 MED ORDER — TETANUS-DIPHTH-ACELL PERTUSSIS 5-2.5-18.5 LF-MCG/0.5 IM SUSY
0.5000 mL | PREFILLED_SYRINGE | Freq: Once | INTRAMUSCULAR | Status: AC
Start: 2021-12-01 — End: 2021-12-01
  Administered 2021-12-01: 0.5 mL via INTRAMUSCULAR
  Filled 2021-12-01: qty 0.5

## 2021-12-01 MED ORDER — ACETAMINOPHEN 500 MG PO TABS
1000.0000 mg | ORAL_TABLET | Freq: Once | ORAL | Status: AC
Start: 2021-12-01 — End: 2021-12-01
  Administered 2021-12-01: 1000 mg via ORAL
  Filled 2021-12-01: qty 2

## 2021-12-01 NOTE — ED Notes (Signed)
Received verbal report from Kuch Y RN at this time 

## 2021-12-01 NOTE — ED Notes (Signed)
Pt is non verbal and unable to answer questions at this time. This baseline for pt. He was able to motion that he needed to urinate and a urinal was provided for him with assistance from this nurse ?

## 2021-12-01 NOTE — ED Notes (Signed)
[facility] arrived for transport 

## 2021-12-01 NOTE — ED Notes (Signed)
Made pt and family aware he was up for d/c and asked how did he get to and from. I was advised pt needs an ambulance. Spoke with Network engineer to request [facility] for transportation at this time ?

## 2021-12-01 NOTE — Discharge Instructions (Addendum)
It was our pleasure to provide your ER care today - we hope that you feel better. ? ?Keep abrasion very clean. Ice/coldpack to area area.  ? ?Take acetaminophen as need for pain. ? ?Fall precautions -  use great care to avoid falling - call for assistance when up/out of bed.  ? ?On today's labs, your coumadin level is subtherapeutic (INR 1) - make sure you are receiving the medication as prescribed by your doctor, and contact your doctor/doctors office tomorrow to discuss the level and your dose.    No urine infection was noted on your urine tests tonight - for urine symptoms, follow up with your doctor this coming week.  ? ?Return to ER if worse, new symptoms, new/severe pain, or other concern.  ?

## 2021-12-01 NOTE — ED Triage Notes (Signed)
Patient arrived via EMS from [facility] for fall. Patient was found on bathroom floor. Patient non-verbal at baseline due to previous CVA. Bruise and small laceration noted on forehead and right arm. Per EMS: CBG-152, BP-120/72, SpO2-96% on room air.  ?

## 2021-12-01 NOTE — ED Provider Notes (Addendum)
?[facility] ?Provider Note ? ? ?CSN: [id number] ?Arrival date & time: 12/01/21  1513 ? ?  ? ?History ? ?Chief Complaint  ?Patient presents with  ? Fall  ? ? ?Henry David is a 61 y.o. male. ? ?Patient w fall at facility. Pt with prior cva with aphasia and right hemiparesis at his baseline, was up/washing self when fell. No loc noted. Contusion to head. Healed small abrasion/superficial lac to left eyebrow. Pt limited historian, level 5 caveat, aphasia. Is on coumadin therapy/atrial thrombus. Also points to right hand as area of pain.  ? ?The history is provided by the patient, medical records, a relative and the EMS personnel.  ?Fall ? ? ?  ? ?Home Medications ?Prior to Admission medications   ?Medication Sig Start Date End Date Taking? Authorizing Provider  ?acetaminophen (TYLENOL) 160 MG/5ML solution Place 20.3 mLs (650 mg total) into feeding tube every 4 (four) hours as needed for mild pain (or temp > 37.5 C (99.5 F)). 06/03/17   Cherene Altes, MD  ?acetaminophen (TYLENOL) 325 MG tablet Take 2 tablets (650 mg total) by mouth every 4 (four) hours as needed for mild pain (or temp > 37.5 C (99.5 F)). 06/03/17   Cherene Altes, MD  ?Amino Acids-Protein Hydrolys (FEEDING SUPPLEMENT, PRO-STAT SUGAR FREE 64,) LIQD Place 30 mLs into feeding tube 2 (two) times daily. 06/03/17   Cherene Altes, MD  ?aspirin EC 81 MG tablet Take 1 tablet (81 mg total) by mouth daily. Swallow whole. 12/26/20   Troy Sine, MD  ?atorvastatin (LIPITOR) 40 MG tablet Take 40 mg by mouth daily.    [provider]  ?baclofen (LIORESAL) 10 MG tablet Take by mouth. 12/21/20   [provider]  ?Baclofen 5 MG TABS Take 1 tablet by mouth 3 (three) times daily. 10/22/21   [provider]  ?cefUROXime (CEFTIN) 500 MG tablet Take 1 tablet (500 mg total) by mouth 2 (two) times daily with a meal. 06/03/17   Cherene Altes, MD  ?doxycycline (VIBRA-TABS) 100 MG tablet Take  100 mg by mouth 2 (two) times daily. 12/21/20   [provider]  ?enoxaparin (LOVENOX) 80 MG/0.8ML injection Inject 0.8 mLs (80 mg total) into the skin every 12 (twelve) hours. 01/29/21   Troy Sine, MD  ?FAMOTIDINE ORIG ST 10 MG tablet Take 10 mg by mouth daily. 12/23/20   [provider]  ?fluticasone (FLONASE) 50 MCG/ACT nasal spray Place into both nostrils. 11/27/20   [provider]  ?folic acid (FOLVITE) 1 MG tablet Place 1 tablet (1 mg total) into feeding tube daily. 06/04/17   Cherene Altes, MD  ?insulin aspart (NOVOLOG) 100 UNIT/ML injection Inject 0-15 Units into the skin every 4 (four) hours. 06/03/17   Cherene Altes, MD  ?melatonin 5 MG TABS Take 5 mg by mouth at bedtime. 12/17/20   [provider]  ?mirtazapine (REMERON) 15 MG tablet Take 15 mg by mouth at bedtime. 12/25/20   [provider]  ?multivitamin (PROSIGHT) TABS tablet Take 1 tablet by mouth daily. 06/04/17   Cherene Altes, MD  ?mupirocin ointment (BACTROBAN) 2 % SMARTSIG:1 Application Topical 2-3 Times Daily 12/21/20   [provider]  ?niacin 500 MG tablet Take 1 tablet (500 mg total) by mouth daily. 06/04/17   Cherene Altes, MD  ?NUEDEXTA 20-10 MG capsule Take 1 capsule by mouth 2 (two) times daily. 12/21/20   [provider]  ?Nutritional Supplements (  FEEDING SUPPLEMENT, JEVITY 1.2 CAL,) LIQD Place 1,000 mLs into feeding tube continuous. 06/03/17   Cherene Altes, MD  ?pantoprazole (PROTONIX) 40 MG tablet Take 1 tablet (40 mg total) by mouth daily. 06/04/17   Cherene Altes, MD  ?QUEtiapine (SEROQUEL) 25 MG tablet Take 1 tablet (25 mg total) by mouth at bedtime. 06/03/17   Cherene Altes, MD  ?senna-docusate (SENOKOT-S) 8.6-50 MG tablet Take 1 tablet by mouth at bedtime. 06/03/17   Cherene Altes, MD  ?sertraline (ZOLOFT) 100 MG tablet Take 1 tablet by mouth daily. 09/24/20   [provider]  ?sertraline (ZOLOFT) 25 MG tablet Take 25 mg  by mouth daily. 11/29/20   [provider]  ?thiamine 100 MG tablet Take 100 mg by mouth daily. 08/29/21   [provider]  ?triamcinolone cream (KENALOG) 0.1 % Apply topically. 10/31/20   [provider]  ?warfarin (COUMADIN) 4 MG tablet Take 4 mg by mouth every other day. Takes 4 (Four) MG Every Tuesday , Thursday, Saturday and Sunday.    [provider]  ?warfarin (COUMADIN) 5 MG tablet Start with 5 mg qd, dose to be determined by Ritta Slot protocol 01/29/21   Troy Sine, MD  ?   ? ?Allergies    ?Coconut fatty acids   ? ?Review of Systems   ?Review of Systems  ?Unable to perform ROS: Patient nonverbal  ?Constitutional:  Negative for fever.  ?HENT:  Negative for nosebleeds.   ?Gastrointestinal:  Negative for vomiting.  ? ?Physical Exam ?Updated Vital Signs ?BP (!) 98/50   Pulse 61   Temp 98.9 ?F (37.2 ?C) (Oral)   Resp 17   SpO2 96%  ?Physical Exam ?Vitals and nursing note reviewed.  ?Constitutional:   ?   Appearance: Normal appearance. He is well-developed.  ?HENT:  ?   Head:  ?   Comments: Contusion left scalp and left forehead/eyebrow area. Healing/scabbed superficial wound to left eyebrow without sign of infection. Facial bones/orbits grossly intact.  ?   Nose: Nose normal.  ?   Mouth/Throat:  ?   Mouth: Mucous membranes are moist.  ?   Pharynx: Oropharynx is clear.  ?Eyes:  ?   General: No scleral icterus. ?   Extraocular Movements: Extraocular movements intact.  ?   Conjunctiva/sclera: Conjunctivae normal.  ?   Pupils: Pupils are equal, round, and reactive to light.  ?Neck:  ?   Trachea: No tracheal deviation.  ?Cardiovascular:  ?   Rate and Rhythm: Normal rate and regular rhythm.  ?   Pulses: Normal pulses.  ?   Heart sounds: Normal heart sounds. No murmur heard. ?  No friction rub. No gallop.  ?Pulmonary:  ?   Effort: Pulmonary effort is normal. No accessory muscle usage or respiratory distress.  ?   Breath sounds: Normal breath sounds.  ?Chest:  ?   Chest wall: No  tenderness.  ?Abdominal:  ?   General: Bowel sounds are normal. There is no distension.  ?   Palpations: Abdomen is soft.  ?   Tenderness: There is no abdominal tenderness. There is no guarding.  ?   Comments: No abd contusion noted.   ?Genitourinary: ?   Comments: No cva tenderness. ?Musculoskeletal:     ?   General: No swelling or tenderness.  ?   Cervical back: Normal range of motion and neck supple. No rigidity.  ?   Comments: ?mid cervical tenderness, CTLS spine, non tender, aligned, no step off. ?Tenderness dorsum left  hand, superficial abrasion to area, otherwise good rom bil ext without pain or focal bony tenderness.   ?Skin: ?   General: Skin is warm and dry.  ?   Findings: No rash.  ?Neurological:  ?   Mental Status: He is alert.  ?   Comments: Alert. Aphasic - speech at baseline per family member. Right hemiparesis - baseline per family. Moves left side purposefully with good strength.   ?Psychiatric:     ?   Mood and Affect: Mood normal.  ? ? ?ED Results / Procedures / Treatments   ?Labs ?(all labs ordered are listed, but only abnormal results are displayed) ?Results for orders placed or performed during the hospital encounter of 12/01/21  ?Protime-INR  ?Result Value Ref Range  ? Prothrombin Time 13.5 11.4 - 15.2 seconds  ? INR 1.0 0.8 - 1.2  ?Urinalysis, Routine w reflex microscopic Urine, Clean Catch  ?Result Value Ref Range  ? Color, Urine YELLOW YELLOW  ? APPearance TURBID (A) CLEAR  ? Specific Gravity, Urine 1.020 1.005 - 1.030  ? pH 5.0 5.0 - 8.0  ? Glucose, UA NEGATIVE NEGATIVE mg/dL  ? Hgb urine dipstick NEGATIVE NEGATIVE  ? Bilirubin Urine NEGATIVE NEGATIVE  ? Ketones, ur NEGATIVE NEGATIVE mg/dL  ? Protein, ur NEGATIVE NEGATIVE mg/dL  ? Nitrite NEGATIVE NEGATIVE  ? Leukocytes,Ua NEGATIVE NEGATIVE  ? Bacteria, UA RARE (A) NONE SEEN  ? Mucus PRESENT   ? Amorphous Crystal PRESENT   ? ?CT Head Wo Contrast ? ?Result Date: 12/01/2021 ?CLINICAL DATA:  Neck trauma, midline tenderness (Age 53-64y); Head  trauma, abnormal mental status (Age 47-64y) EXAM: CT HEAD WITHOUT CONTRAST CT CERVICAL SPINE WITHOUT CONTRAST TECHNIQUE: Multidetector CT imaging of the head and cervical spine was performed following the

## 2021-12-01 NOTE — ED Notes (Signed)
Attempted to call [facility] for verbal report however there was no answer ?

## 2021-12-01 NOTE — ED Notes (Signed)
Lab draw attempted without success, phlebotomy requested ?

## 2021-12-03 ENCOUNTER — Encounter: Payer: Self-pay | Admitting: *Deleted

## 2021-12-04 ENCOUNTER — Institutional Professional Consult (permissible substitution): Payer: Medicaid Other | Admitting: Neurology

## 2022-01-02 ENCOUNTER — Ambulatory Visit: Payer: Medicaid Other | Admitting: Podiatry

## 2022-01-21 ENCOUNTER — Telehealth: Payer: Self-pay | Admitting: Podiatry

## 2022-01-21 ENCOUNTER — Ambulatory Visit (INDEPENDENT_AMBULATORY_CARE_PROVIDER_SITE_OTHER): Payer: Medicaid Other | Admitting: Podiatry

## 2022-01-21 ENCOUNTER — Other Ambulatory Visit: Payer: Self-pay | Admitting: Podiatry

## 2022-01-21 DIAGNOSIS — M79674 Pain in right toe(s): Secondary | ICD-10-CM | POA: Diagnosis not present

## 2022-01-21 DIAGNOSIS — Z8673 Personal history of transient ischemic attack (TIA), and cerebral infarction without residual deficits: Secondary | ICD-10-CM

## 2022-01-21 DIAGNOSIS — M79675 Pain in left toe(s): Secondary | ICD-10-CM | POA: Diagnosis not present

## 2022-01-21 DIAGNOSIS — B351 Tinea unguium: Secondary | ICD-10-CM | POA: Diagnosis not present

## 2022-01-21 NOTE — Telephone Encounter (Signed)
I am not sure where she is wanting to get the diabetic shoes and inserts.  I will go ahead and place an order in the patient's chart.  We can print it off and she can come to the front desk to pick it up.  Unfortunately we do not do diabetic shoes and insoles for Medicaid either.-Dr. Logan Bores

## 2022-01-21 NOTE — Telephone Encounter (Signed)
Patients POA came in and she is requesting for a new order for diabetic shoes and insert to be entered. [facility] is no longer accepting orders for inserts and shoes.

## 2022-01-21 NOTE — Progress Notes (Signed)
   SUBJECTIVE Patient with a history of stroke presents to office today complaining of elongated, thickened nails that cause pain while ambulating in shoes.  Patient is unable to trim their own nails. Patient is here for further evaluation and treatment.   Past Medical History:  Diagnosis Date   Anxiety    Apical mural thrombus    BPH (benign prostatic hyperplasia)    Depression    Diverticulitis    Elevated cholesterol    Essential hypertension    Post traumatic stress disorder    STEMI (ST elevation myocardial infarction) ([facility])     OBJECTIVE General Patient is awake, alert, and oriented x 3 and in no acute distress. Derm Skin is dry and supple bilateral. Negative open lesions or macerations. Remaining integument unremarkable. Nails are tender, long, thickened and dystrophic with subungual debris, consistent with onychomycosis, 1-5 bilateral. No signs of infection noted. Vasc  DP and PT pedal pulses palpable bilaterally. Temperature gradient within normal limits.  Neuro Epicritic and protective threshold sensation diminished bilaterally.  Musculoskeletal Exam No symptomatic pedal deformities noted bilateral. Muscular strength within normal limits.  ASSESSMENT 1. History of stroke 2.  Pain due to onychomycosis of toenails both  PLAN OF CARE 1. Patient evaluated today. 2. Instructed to maintain good pedal hygiene and foot care. Stressed importance of controlling blood sugar.  3. Mechanical debridement of nails 1-5 bilaterally performed using a nail nipper. Filed with dremel without incident.  4.  Appointment for diabetic shoes and insoles  5.  Return to clinic in 4 months    Felecia Shelling, DPM [facility]  Dr. Felecia Shelling, DPM    [address] [place], [place] [address]                Office [phone]  Fax [phone]

## 2022-01-21 NOTE — Progress Notes (Signed)
Diabetic shoes and custom molded insoles

## 2022-05-20 ENCOUNTER — Ambulatory Visit: Payer: Commercial Managed Care - HMO | Admitting: Podiatry

## 2022-05-20 ENCOUNTER — Telehealth: Payer: Self-pay | Admitting: Podiatry

## 2022-06-03 ENCOUNTER — Ambulatory Visit: Payer: Commercial Managed Care - HMO | Admitting: Podiatry

## 2022-06-04 ENCOUNTER — Ambulatory Visit (INDEPENDENT_AMBULATORY_CARE_PROVIDER_SITE_OTHER): Payer: Commercial Managed Care - HMO | Admitting: Podiatry

## 2022-06-04 DIAGNOSIS — M79675 Pain in left toe(s): Secondary | ICD-10-CM

## 2022-06-04 DIAGNOSIS — M79674 Pain in right toe(s): Secondary | ICD-10-CM

## 2022-06-04 DIAGNOSIS — B351 Tinea unguium: Secondary | ICD-10-CM | POA: Diagnosis not present

## 2022-06-04 NOTE — Progress Notes (Signed)
   Chief Complaint  Patient presents with   Callouses    Patient is here for diabetic foot care.    SUBJECTIVE Patient with a history of stroke presents to office today complaining of elongated, thickened nails that cause pain while ambulating in shoes.  Patient is unable to trim their own nails. Patient is here for further evaluation and treatment.   Past Medical History:  Diagnosis Date   Anxiety    Apical mural thrombus    BPH (benign prostatic hyperplasia)    Depression    Diverticulitis    Elevated cholesterol    Essential hypertension    Post traumatic stress disorder    STEMI (ST elevation myocardial infarction) ([place])     OBJECTIVE General Patient is awake, alert, and oriented x 3 and in no acute distress. Derm Skin is dry and supple bilateral. Negative open lesions or macerations. Remaining integument unremarkable. Nails are tender, long, thickened and dystrophic with subungual debris, consistent with onychomycosis, 1-5 bilateral. No signs of infection noted. Vasc  DP and PT pedal pulses palpable bilaterally. Temperature gradient within normal limits.  Neuro Epicritic and protective threshold sensation diminished bilaterally.  Musculoskeletal Exam No symptomatic pedal deformities noted bilateral. Muscular strength within normal limits.  ASSESSMENT 1. History of stroke 2.  Pain due to onychomycosis of toenails both  PLAN OF CARE 1. Patient evaluated today. 2. Instructed to maintain good pedal hygiene and foot care. Stressed importance of controlling blood sugar.  3. Mechanical debridement of nails 1-5 bilaterally performed using a nail nipper. Filed with dremel without incident.  4.  Appointment for diabetic shoes and insoles  5.  Return to clinic in 4 months    Edrick Kins, DPM [facility]  Dr. Edrick Kins, DPM    [address], [place] [address]                Office [phone]  Fax [phone]

## 2022-07-17 ENCOUNTER — Encounter: Payer: Self-pay | Admitting: Neurology

## 2022-07-17 ENCOUNTER — Ambulatory Visit (INDEPENDENT_AMBULATORY_CARE_PROVIDER_SITE_OTHER): Payer: Commercial Managed Care - HMO | Admitting: Neurology

## 2022-07-17 VITALS — BP 111/70 | HR 62

## 2022-07-17 DIAGNOSIS — Z8673 Personal history of transient ischemic attack (TIA), and cerebral infarction without residual deficits: Secondary | ICD-10-CM

## 2022-07-17 DIAGNOSIS — R4701 Aphasia: Secondary | ICD-10-CM | POA: Diagnosis not present

## 2022-07-17 DIAGNOSIS — G8191 Hemiplegia, unspecified affecting right dominant side: Secondary | ICD-10-CM | POA: Diagnosis not present

## 2022-07-17 DIAGNOSIS — I69398 Other sequelae of cerebral infarction: Secondary | ICD-10-CM

## 2022-07-17 DIAGNOSIS — R252 Cramp and spasm: Secondary | ICD-10-CM

## 2022-07-17 NOTE — Patient Instructions (Signed)
Continue current medications  Increase physical therapy/physical activity  Follow up with PCP  Return as needed

## 2022-07-17 NOTE — Progress Notes (Signed)
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  He is alone.  He is globally aphasic so history unavailable.  I spoke with the facility, nurse Dawn who reported patient is overall stable.  Initially he was refusing his medications but as of now he has been compliant with the medications.  He is participating in physical activity.  Overall stable, no current concerns.   HISTORY OF PRESENT ILLNESS:  This is a 61 year old gentleman with past medical history of a left MCA stroke status post residual right hemiplegia and global aphasia, diabetes mellitus type 2, hypertension, hyperlipidemia and cardiac thrombus who is presenting to establish care.  History was obtained by cousin and also via chart review as patient is globally aphasic.  Per cousin patient was found down in 2018 home by his roommate.  He was taken to [facility] where he was found to have a large left MCA territory stroke, etiology likely last left ICA occlusion.  He was not a tPA candidate at that time.  Per cousin after discharge patient was taken to rehab and then to a nursing home.  He has been in a living in a nursing home for the past 4 years.  Currently he is at [facility].  At baseline patient is aphasic, he can read and write to communicate at times.  He does have right side hemiplegia but is able to stand and able to ambulate with a device.  Currently he is not getting any therapy.     OTHER MEDICAL CONDITIONS: DMII, Hypertension, HLD, Cardiac thrombus    REVIEW OF SYSTEMS: Full 14 system review of systems performed and negative with exception of: unable to obtain due to severe  aphasia   ALLERGIES: Allergies  Allergen Reactions   Coconut Fatty Acids     unknown    HOME MEDICATIONS: Outpatient Medications Prior to Visit  Medication Sig Dispense Refill   acetaminophen (TYLENOL) 160 MG/5ML solution Place 20.3 mLs (650 mg total) into feeding tube every 4 (four) hours as needed for mild pain (or temp > 37.5 C (99.5 F)). 120 mL 0   acetaminophen (TYLENOL) 325 MG tablet Take 2 tablets (650 mg total) by mouth every 4 (four) hours as needed for mild pain (or temp > 37.5 C (99.5 F)).     Amino Acids-Protein Hydrolys (FEEDING SUPPLEMENT, PRO-STAT SUGAR FREE 64,) LIQD Place 30 mLs into feeding tube 2 (two) times daily. 900 mL 0   aspirin EC 81 MG tablet Take 1 tablet (81 mg total) by mouth daily. Swallow whole. 90 tablet 3   atorvastatin (LIPITOR) 40 MG tablet Take 40 mg by mouth daily.     baclofen (LIORESAL) 10 MG tablet Take by mouth.     Baclofen 5 MG TABS Take 1 tablet by mouth 3 (three) times daily.     cefUROXime (CEFTIN) 500 MG tablet Take 1 tablet (500 mg total) by mouth 2 (two) times daily with a meal.     doxycycline (VIBRA-TABS) 100 MG tablet Take 100 mg by mouth 2 (two) times daily.     enoxaparin (LOVENOX) 80 MG/0.8ML injection Inject 0.8 mLs (80 mg total) into the skin every 12 (twelve)  hours. 0 mL 0   FAMOTIDINE ORIG ST 10 MG tablet Take 10 mg by mouth daily.     fluticasone (FLONASE) 50 MCG/ACT nasal spray Place into both nostrils.     folic acid (FOLVITE) 1 MG tablet Place 1 tablet (1 mg total) into feeding tube daily.     insulin aspart (NOVOLOG) 100 UNIT/ML injection Inject 0-15 Units into the skin every 4 (four) hours. 10 mL 11   melatonin 5 MG TABS Take 5 mg by mouth at bedtime.     mirtazapine (REMERON) 15 MG tablet Take 15 mg by mouth at bedtime.     multivitamin (PROSIGHT) TABS tablet Take 1 tablet by mouth daily. 30 each 0   mupirocin ointment (BACTROBAN) 2 % SMARTSIG:1 Application Topical 2-3 Times Daily     niacin 500 MG tablet Take 1 tablet  (500 mg total) by mouth daily.     NUEDEXTA 20-10 MG capsule Take 1 capsule by mouth 2 (two) times daily.     Nutritional Supplements (FEEDING SUPPLEMENT, JEVITY 1.2 CAL,) LIQD Place 1,000 mLs into feeding tube continuous.  0   pantoprazole (PROTONIX) 40 MG tablet Take 1 tablet (40 mg total) by mouth daily.     QUEtiapine (SEROQUEL) 25 MG tablet Take 1 tablet (25 mg total) by mouth at bedtime.     senna-docusate (SENOKOT-S) 8.6-50 MG tablet Take 1 tablet by mouth at bedtime.     sertraline (ZOLOFT) 100 MG tablet Take 1 tablet by mouth daily.     sertraline (ZOLOFT) 25 MG tablet Take 25 mg by mouth daily.     thiamine 100 MG tablet Take 100 mg by mouth daily.     triamcinolone cream (KENALOG) 0.1 % Apply topically.     warfarin (COUMADIN) 4 MG tablet Take 4 mg by mouth every other day. Takes 4 (Four) MG Every Tuesday , Thursday, Saturday and Sunday.     warfarin (COUMADIN) 5 MG tablet Start with 5 mg qd, dose to be determined by Joetta Manners protocol 90 tablet 3   No facility-administered medications prior to visit.    PAST MEDICAL HISTORY: Past Medical History:  Diagnosis Date   Anxiety    Apical mural thrombus    BPH (benign prostatic hyperplasia)    Depression    Diverticulitis    Elevated cholesterol    Essential hypertension    Post traumatic stress disorder    STEMI (ST elevation myocardial infarction) ([facility])     PAST SURGICAL HISTORY: Past Surgical History:  Procedure Laterality Date   CARDIAC CATHETERIZATION N/A 07/09/2015   Procedure: Left Heart Cath and Coronary Angiography;  Surgeon: Lennette Bihari, MD;  Location: [facility] INVASIVE CV LAB;  Service: Cardiovascular;  Laterality: N/A;   CARDIAC CATHETERIZATION N/A 07/09/2015   Procedure: Coronary Stent Intervention;  Surgeon: Lennette Bihari, MD;  Location: [facility] INVASIVE CV LAB;  Service: Cardiovascular;  Laterality: N/A;   CARDIAC CATHETERIZATION N/A 07/11/2015   Procedure: Coronary Stent Intervention;  Surgeon: Lennette Bihari, MD;   Location: [facility] INVASIVE CV LAB;  Service: Cardiovascular;  Laterality: N/A;   ESOPHAGOGASTRODUODENOSCOPY N/A 05/29/2017   Procedure: ESOPHAGOGASTRODUODENOSCOPY (EGD);  Surgeon: Jimmye Norman, MD;  Location: [facility] ENDOSCOPY;  Service: General;  Laterality: N/A;   KNEE SURGERY     PEG PLACEMENT N/A 05/29/2017   Procedure: PERCUTANEOUS ENDOSCOPIC GASTROSTOMY (PEG) PLACEMENT;  Surgeon: Jimmye Norman, MD;  Location: [facility] ENDOSCOPY;  Service: General;  Laterality: N/A;    FAMILY HISTORY: Family History  Problem Relation Age of Onset  Heart disease Mother    Heart disease Father     SOCIAL HISTORY: Social History   Socioeconomic History   Marital status: Single    Spouse name: Not on file   Number of children: Not on file   Years of education: Not on file   Highest education level: Not on file  Occupational History   Not on file  Tobacco Use   Smoking status: Former    Packs/day: 0.00    Types: Cigarettes    Quit date: 07/04/2015    Years since quitting: 7.0   Smokeless tobacco: Former   Tobacco comments:    Nov. 27 2016  Substance and Sexual Activity   Alcohol use: Yes    Alcohol/week: 4.0 standard drinks of alcohol    Types: 4 Cans of beer per week    Comment: occassional use   Drug use: No   Sexual activity: Not Currently  Other Topics Concern   Not on file  Social History Narrative   Not on file   Social Determinants of Health   Financial Resource Strain: Not on file  Food Insecurity: No Food Insecurity (04/06/2021)   Hunger Vital Sign    Worried About Running Out of Food in the Last Year: Never true    Ran Out of Food in the Last Year: Never true  Transportation Needs: Not on file  Physical Activity: Not on file  Stress: Not on file  Social Connections: Not on file  Intimate Partner Violence: Not on file    PHYSICAL EXAM  GENERAL EXAM/CONSTITUTIONAL: Vitals:  Vitals:   07/17/22 1340  BP: 111/70  Pulse: 62    There is no height or weight on file to  calculate BMI. Wt Readings from Last 3 Encounters:  11/02/21 193 lb 3.2 oz (87.6 kg)  08/07/21 185 lb (83.9 kg)  04/06/21 185 lb (83.9 kg)   Patient is in no distress; well developed, nourished and groomed; neck is supple, sitting in his wheelchair.   EYES: Able to track examiner, Extraocular movements intacts,   MUSCULOSKELETAL: Gait, strength, tone, movements noted in Neurologic exam below  NEUROLOGIC: MENTAL STATUS:      No data to display         Globally aphasic,     CRANIAL NERVE:  2nd, 3rd, 4th, 6th - able to track examiner, extraocular muscles intact, no nystagmus 5th - Decrease sensation to right side of face noted as in extinction to double stimulus 7th - facial strength symmetric 8th - hearing intact 9th - palate elevates symmetrically, uvula midline 11th - shoulder shrug symmetric 12th - tongue protrusion midline  MOTOR:  normal bulk and tone, full strength in the LUE and LLE. RUE is plegic, there is contraction at the shoulder and elbow. RLE is 4/5   SENSORY:  Extinction to double stimulus on the right.   COORDINATION:  Not perform due to aphasia  GAIT/STATION:  Deferred    DIAGNOSTIC DATA (LABS, IMAGING, TESTING) - I reviewed patient records, labs, notes, testing and imaging myself where available.  Lab Results  Component Value Date   WBC 5.7 10/29/2021   HGB 15.3 10/29/2021   HCT 44.5 10/29/2021   MCV 87 10/29/2021   PLT 261 10/29/2021      Component Value Date/Time   NA 141 10/29/2021 1206   K 4.4 10/29/2021 1206   CL 104 10/29/2021 1206   CO2 20 10/29/2021 1206   GLUCOSE 83 10/29/2021 1206   GLUCOSE 144 (H) 06/03/2017 0518  BUN 13 10/29/2021 1206   CREATININE 0.80 10/29/2021 1206   CREATININE 1.05 03/08/2016 1116   CALCIUM 8.9 10/29/2021 1206   PROT 7.1 10/29/2021 1206   ALBUMIN 4.4 10/29/2021 1206   AST 22 10/29/2021 1206   ALT 16 10/29/2021 1206   ALKPHOS 93 10/29/2021 1206   BILITOT 0.7 10/29/2021 1206   GFRNONAA >60  06/03/2017 0518   GFRNONAA 79 03/08/2016 1116   GFRAA >60 06/03/2017 0518   GFRAA >89 03/08/2016 1116   Lab Results  Component Value Date   CHOL 131 10/29/2021   HDL 34 (L) 10/29/2021   LDLCALC 77 10/29/2021   LDLDIRECT 103.1 05/22/2007   TRIG 105 10/29/2021   CHOLHDL 3.9 10/29/2021   Lab Results  Component Value Date   HGBA1C 5.8 (H) 10/29/2021   Lab Results  Component Value Date   VITAMINB12 440 05/22/2007   Lab Results  Component Value Date   TSH 2.600 10/29/2021    CT Head 05/26/2017 1. Continued interval evolution of large left MCA territory infarct, with decreasing mass effect and edema as compared to most recent CT from 05/24/2017. Left-to-right midline shift is improved now measuring 7 mm (previously 14 mm). 2. Grossly stable petechial hemorrhage as seen by previous MRI. No evidence for malignant hemorrhagic transformation. 3. No new intracranial abnormality   CTA Head and neck 05/16/2017 1. 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 2. No significant collateral flow to the left MCA territory. 3. Patent anterior communicating artery with retrograde filling of the left A1 segment. 4. Minimal atherosclerotic calcification within the cavernous right internal carotid artery. 5. No other significant atherosclerotic disease within the head or neck. 6. Degenerative changes of the cervical spine are most pronounced at C6-7   CT Head 12-10-2021 Large left cerebral encephalomalacia. No evidence of large-territorial acute infarction. No parenchymal hemorrhage. No mass lesion. No extra-axial collection   ASSESSMENT AND PLAN  61 y.o. year old male with past medical history of a left MCA stroke in 2018 with residual aphasia and right hemiplegia, diabetes mellitus type 2, hypertension, hyperlipidemia and history of cardiac thrombus who is presenting for follow up. He is here alone today, so history limited  but overall, per nursing staff over the phone, he has been stable, no current concerns. We will continue current medications, encourage physical activity and have him follow up with PCP. Return as needed.     1. Chronic ischemic left MCA stroke   2. Right hemiplegia (HCC)   3. Global aphasia   4. Spasticity as late effect of cerebrovascular accident (CVA)      Patient Instructions  Continue current medications  Increase physical therapy/physical activity  Follow up with PCP  Return as needed    No orders of the defined types were placed in this encounter.   No orders of the defined types were placed in this encounter.   Return if symptoms worsen or fail to improve.  I have spent a total of 30 minutes dedicated to this patient today, preparing to see patient, performing a medically appropriate examination and evaluation, ordering tests and/or medications and procedures, and counseling and educating the patient/family/caregiver; independently interpreting result and communicating results to the family/patient/caregiver; and documenting clinical information in the electronic medical record.   Windell Norfolk, MD 07/17/2022, 3:07 PM  [facility] [address] [place], [place] [address] [phone]

## 2022-08-07 ENCOUNTER — Ambulatory Visit: Payer: Medicaid Other | Admitting: Neurology

## 2022-08-23 ENCOUNTER — Telehealth: Payer: Self-pay | Admitting: Cardiovascular Disease

## 2022-08-23 NOTE — Telephone Encounter (Signed)
Pt c/o medication issue:  1. Name of Medication:  warfarin (COUMADIN)  2. How are you currently taking this medication (dosage and times per day)?   3. Are you having a reaction (difficulty breathing--STAT)?   4. What is your medication issue?   Henry Mesa, NP with [facility] states the patient has been noncompliant with taking his Coumadin. She also mentions that sometimes he allows them to check his INR and sometimes he does not. She states that she is unable to reason with him. She would like to discuss possibility of switching him to a different medication. She is also requesting to have any recommended orders faxed to [facility].  Phone#: [phone] Fax#: [phone]

## 2022-08-26 NOTE — Telephone Encounter (Signed)
Pt on warfarin for LV apical thrombus noted on 01/2021 echo, still present on 11/2021 echo. Changing to [place] would be an off label use for treatment of LV thrombus, however if pt has been noncompliant with warfarin and is refusing INR checks, changing to DOAC may be warranted. Would need MD input regarding potential anticoag change since it'd be for an off-label use.

## 2022-08-26 NOTE — Telephone Encounter (Signed)
Attempted to contact NP, unable to reach.   Left call back number.

## 2022-08-28 NOTE — Telephone Encounter (Signed)
NP called back. Advised of message below. She is aware we will wait for MD to return and will see what he recommends.   If we change, she request information be faxed.

## 2022-08-28 NOTE — Telephone Encounter (Signed)
Attempted to contact NP, LVM to call back.   Left call back number.

## 2022-09-04 ENCOUNTER — Ambulatory Visit (INDEPENDENT_AMBULATORY_CARE_PROVIDER_SITE_OTHER): Payer: Commercial Managed Care - HMO | Admitting: Podiatry

## 2022-09-04 DIAGNOSIS — M79675 Pain in left toe(s): Secondary | ICD-10-CM

## 2022-09-04 DIAGNOSIS — B351 Tinea unguium: Secondary | ICD-10-CM

## 2022-09-04 DIAGNOSIS — M79674 Pain in right toe(s): Secondary | ICD-10-CM | POA: Diagnosis not present

## 2022-09-04 NOTE — Progress Notes (Signed)
   Chief Complaint  Patient presents with   Nail Problem    Routine foot care      SUBJECTIVE Patient with a history of stroke presents to office today complaining of elongated, thickened nails that cause pain while ambulating in shoes.  Patient is unable to trim their own nails. Patient is here for further evaluation and treatment.   Past Medical History:  Diagnosis Date   Anxiety    Apical mural thrombus    BPH (benign prostatic hyperplasia)    Depression    Diverticulitis    Elevated cholesterol    Essential hypertension    Post traumatic stress disorder    STEMI (ST elevation myocardial infarction) ([place])     OBJECTIVE General Patient is awake, alert, and oriented x 3 and in no acute distress. Derm Skin is dry and supple bilateral. Negative open lesions or macerations. Remaining integument unremarkable. Nails are tender, long, thickened and dystrophic with subungual debris, consistent with onychomycosis, 1-5 bilateral. No signs of infection noted. Vasc  DP and PT pedal pulses palpable bilaterally. Temperature gradient within normal limits.  Neuro Epicritic and protective threshold sensation diminished bilaterally.  Musculoskeletal Exam No symptomatic pedal deformities noted bilateral. Muscular strength within normal limits.  ASSESSMENT 1. History of stroke 2.  Pain due to onychomycosis of toenails both  PLAN OF CARE 1. Patient evaluated today. 2. Instructed to maintain good pedal hygiene and foot care. Stressed importance of controlling blood sugar.  3. Mechanical debridement of nails 1-5 bilaterally performed using a nail nipper. Filed with dremel without incident.  4.  Appointment for diabetic shoes and insoles  5.  Return to clinic in 4 months    Edrick Kins, DPM [facility]  Dr. Edrick Kins, DPM    [address], [place] [address]                Office [phone]  Fax [phone]

## 2022-09-13 NOTE — Telephone Encounter (Signed)
Faxed over information below in regards to MD change.

## 2022-09-13 NOTE — Telephone Encounter (Signed)
Patient has his documented apical thrombus and is noncompliant with warfarin and may be best to consider Eliquis for some anticoagulant benefit and potential further stroke reduction risk.  With Age in the 40s and normal renal function dose will be 5 mg twice a day

## 2022-10-16 ENCOUNTER — Ambulatory Visit: Payer: Commercial Managed Care - HMO | Attending: Nurse Practitioner | Admitting: Nurse Practitioner

## 2022-10-16 ENCOUNTER — Emergency Department (HOSPITAL_COMMUNITY): Payer: Medicaid Other

## 2022-10-16 ENCOUNTER — Other Ambulatory Visit: Payer: Self-pay

## 2022-10-16 ENCOUNTER — Encounter: Payer: Self-pay | Admitting: Nurse Practitioner

## 2022-10-16 ENCOUNTER — Emergency Department (HOSPITAL_COMMUNITY)
Admission: EM | Admit: 2022-10-16 | Discharge: 2022-10-16 | Disposition: A | Discharge status: Home / Self Care | Payer: Medicaid Other | Attending: Emergency Medicine | Admitting: Emergency Medicine

## 2022-10-16 ENCOUNTER — Encounter (HOSPITAL_COMMUNITY): Payer: Self-pay

## 2022-10-16 VITALS — BP 104/61 | HR 83 | Temp 98.7°F | Ht 70.0 in | Wt 198.6 lb

## 2022-10-16 DIAGNOSIS — K8020 Calculus of gallbladder without cholecystitis without obstruction: Secondary | ICD-10-CM | POA: Insufficient documentation

## 2022-10-16 DIAGNOSIS — Z8673 Personal history of transient ischemic attack (TIA), and cerebral infarction without residual deficits: Secondary | ICD-10-CM

## 2022-10-16 DIAGNOSIS — Z7901 Long term (current) use of anticoagulants: Secondary | ICD-10-CM | POA: Diagnosis not present

## 2022-10-16 DIAGNOSIS — Z79899 Other long term (current) drug therapy: Secondary | ICD-10-CM | POA: Diagnosis not present

## 2022-10-16 DIAGNOSIS — I10 Essential (primary) hypertension: Secondary | ICD-10-CM

## 2022-10-16 DIAGNOSIS — I2510 Atherosclerotic heart disease of native coronary artery without angina pectoris: Secondary | ICD-10-CM

## 2022-10-16 DIAGNOSIS — I513 Intracardiac thrombosis, not elsewhere classified: Secondary | ICD-10-CM

## 2022-10-16 DIAGNOSIS — R1011 Right upper quadrant pain: Secondary | ICD-10-CM

## 2022-10-16 DIAGNOSIS — E785 Hyperlipidemia, unspecified: Secondary | ICD-10-CM

## 2022-10-16 DIAGNOSIS — I255 Ischemic cardiomyopathy: Secondary | ICD-10-CM

## 2022-10-16 LAB — CBC WITH DIFFERENTIAL/PLATELET
Abs Immature Granulocytes: 0.03 10*3/uL (ref 0.00–0.07)
Basophils Absolute: 0.1 10*3/uL (ref 0.0–0.1)
Basophils Relative: 1 %
Eosinophils Absolute: 0.1 10*3/uL (ref 0.0–0.5)
Eosinophils Relative: 1 %
HCT: 44.2 % (ref 39.0–52.0)
Hemoglobin: 14.5 g/dL (ref 13.0–17.0)
Immature Granulocytes: 0 %
Lymphocytes Relative: 15 %
Lymphs Abs: 1.6 10*3/uL (ref 0.7–4.0)
MCH: 29.2 pg (ref 26.0–34.0)
MCHC: 32.8 g/dL (ref 30.0–36.0)
MCV: 89.1 fL (ref 80.0–100.0)
Monocytes Absolute: 1 10*3/uL (ref 0.1–1.0)
Monocytes Relative: 10 %
Neutro Abs: 8.1 10*3/uL — ABNORMAL HIGH (ref 1.7–7.7)
Neutrophils Relative %: 73 %
Platelets: 300 10*3/uL (ref 150–400)
RBC: 4.96 MIL/uL (ref 4.22–5.81)
RDW: 13.7 % (ref 11.5–15.5)
WBC: 11 10*3/uL — ABNORMAL HIGH (ref 4.0–10.5)
nRBC: 0 % (ref 0.0–0.2)

## 2022-10-16 LAB — COMPREHENSIVE METABOLIC PANEL
ALT: 18 U/L (ref 0–44)
AST: 20 U/L (ref 15–41)
Albumin: 3.6 g/dL (ref 3.5–5.0)
Alkaline Phosphatase: 71 U/L (ref 38–126)
Anion gap: 8 (ref 5–15)
BUN: 14 mg/dL (ref 8–23)
CO2: 25 mmol/L (ref 22–32)
Calcium: 8.4 mg/dL — ABNORMAL LOW (ref 8.9–10.3)
Chloride: 101 mmol/L (ref 98–111)
Creatinine, Ser: 0.89 mg/dL (ref 0.61–1.24)
GFR, Estimated: >60 mL/min (ref >60)
Glucose, Bld: 115 mg/dL — ABNORMAL HIGH (ref 70–99)
Potassium: 3.8 mmol/L (ref 3.5–5.1)
Sodium: 134 mmol/L — ABNORMAL LOW (ref 135–145)
Total Bilirubin: 0.9 mg/dL (ref 0.3–1.2)
Total Protein: 7.6 g/dL (ref 6.5–8.1)

## 2022-10-16 LAB — PROTIME-INR
INR: 1.1 (ref 0.8–1.2)
Prothrombin Time: 14.3 seconds (ref 11.4–15.2)

## 2022-10-16 LAB — LIPASE, BLOOD: Lipase: 46 U/L (ref 11–51)

## 2022-10-16 MED ORDER — MORPHINE SULFATE (PF) 4 MG/ML IV SOLN
4.0000 mg | Freq: Once | INTRAVENOUS | Status: AC
  Administered 2022-10-16: 4 mg via INTRAVENOUS
  Filled 2022-10-16: qty 1

## 2022-10-16 NOTE — Discharge Instructions (Signed)
You were seen in the emergency department for your abdominal pain.  Your workup shows that you have gallstones but no signs of gallbladder infection or blockages from the stones.  You can follow-up with general surgery as needed and may need to schedule to have your gallbladder removed in the future if you are continuing to have pain.  You should return to the emergency department if you are having significantly worsening pain, fevers, repetitive vomiting or if you have any other new or concerning symptoms.

## 2022-10-16 NOTE — Patient Instructions (Signed)
Medication Instructions:  Your physician recommends that you continue on your current medications as directed. Please refer to the Current Medication list given to you today.   *If you need a refill on your cardiac medications before your next appointment, please call your pharmacy*   Lab Work: NONE ordered at this time of appointment   If you have labs (blood work) drawn today and your tests are completely normal, you will receive your results only by: [place] (if you have MyChart) OR A paper copy in the mail If you have any lab test that is abnormal or we need to change your treatment, we will call you to review the results.   Testing/Procedures: NONE ordered at this time of appointment     Follow-Up: At [facility], you and your health needs are our priority.  As part of our continuing mission to provide you with exceptional heart care, we have created designated Provider Care Teams.  These Care Teams include your primary Cardiologist (physician) and Advanced Practice Providers (APPs -  Physician Assistants and Nurse Practitioners) who all work together to provide you with the care you need, when you need it.  We recommend signing up for the patient portal called "MyChart".  Sign up information is provided on this After Visit Summary.  MyChart is used to connect with patients for Virtual Visits (Telemedicine).  Patients are able to view lab/test results, encounter notes, upcoming appointments, etc.  Non-urgent messages can be sent to your provider as well.   To learn more about what you can do with MyChart, go to [email or web address].    Your next appointment:    As needed  Provider:   Shelva Majestic, MD     Other Instructions Pt will proceed to the ED for rt abdominal pain.

## 2022-10-16 NOTE — Progress Notes (Signed)
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  Past Medical History    Past Medical History:  Diagnosis Date   Anxiety    Apical mural thrombus    BPH (benign prostatic hyperplasia)    Depression    Diverticulitis    Elevated cholesterol    Essential hypertension    Post traumatic stress disorder    STEMI (ST elevation myocardial infarction) [facility])    Past Surgical History:  Procedure Laterality Date   CARDIAC CATHETERIZATION N/A 07/09/2015   Procedure: Left Heart Cath and Coronary Angiography;  Surgeon: Troy Sine, MD;  Location: [facility];  Service: Cardiovascular;  Laterality: N/A;   CARDIAC CATHETERIZATION N/A 07/09/2015   Procedure: Coronary Stent Intervention;  Surgeon: Troy Sine, MD;  Location: [place] [facility];  Service: Cardiovascular;  Laterality: N/A;   CARDIAC CATHETERIZATION N/A 07/11/2015   Procedure: Coronary Stent Intervention;  Surgeon: Troy Sine, MD;  Location: [place] [facility];  Service: Cardiovascular;  Laterality: N/A;   ESOPHAGOGASTRODUODENOSCOPY N/A 05/29/2017   Procedure: ESOPHAGOGASTRODUODENOSCOPY (EGD);  Surgeon: Judeth Horn, MD;  Location: [facility] ENDOSCOPY;  Service: General;  Laterality: N/A;   KNEE SURGERY     PEG PLACEMENT N/A 05/29/2017   Procedure: PERCUTANEOUS ENDOSCOPIC GASTROSTOMY (PEG) PLACEMENT;  Surgeon: Judeth Horn, MD;  Location: [place] ENDOSCOPY;  Service: General;  Laterality: N/A;    Allergies  Allergies  Allergen Reactions   Coconut Fatty Acids Other (See Comments)    "Allergic," per paperwork from facility     Labs/Other Studies  Reviewed    The following studies were reviewed today: Echo 11/2021: IMPRESSIONS    1. Left ventricular apical thrombus noted - this was also seen on study  done in June 2022. Left ventricular ejection fraction, by estimation, is  45 to 50%. The left ventricle has mildly decreased function. The left  ventricle demonstrates regional wall  motion abnormalities (see scoring diagram/findings for description). Left  ventricular diastolic parameters are consistent with Grade I diastolic  dysfunction (impaired relaxation).   2. Right ventricular systolic function is normal. The right ventricular  size is normal. There is normal pulmonary artery systolic pressure.   3. The mitral valve is normal in structure. No evidence of mitral valve  regurgitation. No evidence of mitral stenosis.   4. The aortic valve is normal in structure. Aortic valve regurgitation is  trivial. No aortic stenosis is present.   5. The inferior vena cava is normal in size with greater than 50%  respiratory variability, suggesting right atrial pressure of 3 mmHg.    Recent Labs: 10/29/2021: TSH 2.600 10/16/2022: ALT 18; BUN 14; Creatinine, Ser 0.89; Hemoglobin 14.5; Platelets 300; Potassium 3.8; Sodium 134  Recent Lipid Panel    Component Value Date/Time   CHOL 131 10/29/2021 1206   TRIG 105 10/29/2021 1206   HDL 34 (L) 10/29/2021 1206   CHOLHDL 3.9 10/29/2021 1206   CHOLHDL 7.2 05/17/2017 0500   VLDL UNABLE TO CALCULATE IF TRIGLYCERIDE OVER 400 mg/dL 05/17/2017 0500   LDLCALC 77 10/29/2021 1206   LDLDIRECT 103.1 05/22/2007 0922    History of Present Illness  62 year old male with the above past medical history including CAD s/p STEMI, DES-LAD, DES-RCA in 2016, 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   He was hospitalized in November 2016 in the setting of large anterior STEMI.  He underwent successful DES-totally occluded LAD.  He  subsequently underwent staged intervention 2 days later to his RCA.  Echocardiogram in 2016 showed LV apical thrombus.  He was started on warfarin.  Repeat echocardiogram in 06/2022 showed EF 40 to 45%, akinesis of the mid apical, anteroseptal, and anterior wall, G1 DD, trivial TR.  Nuclear perfusion study in 2017 showed EF 41%, evidence of prior scar in the mid anterior, anteroseptal, and apical segment, no new ischemia.  He suffered a CVA in October 2018.  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  He has since resided at [facility]. Most recent echocardiogram in 11/2021 showed EF 45 to 50%, mildly decreased LV function, LV apical thrombus, G1 DD, normal RV systolic function, no significant valvular abnormalities.  He was last seen in the office on 11/02/2021 and was stable from a cardiac standpoint.  He denied symptoms concerning for angina.  Our office received a message from [facility] stating that patient had been nonadherent to his warfarin.  He was transitioned to Eliquis per Dr. Claiborne Billings.   He presents today for follow-up.  Since his last visit he has been stable overall from a cardiac standpoint though patient is a very difficult historian due to severe expressive aphasia.  His speech is limited to incomprehensible words.  He was brought to our office today by [facility] transportation.  He is unaccompanied for his visit.  Upon arrival he is grimacing in pain to the point of tears and clenching his right abdominal area in pain. He is unable to provide any additional details due to limited communication.  Our staff contacted the facility. Pt's nurse noted he was unaware of patient's abdominal pain.  She did comment  that he had not taken "any of his medications" for some time now, including his warfarin.  He never started taking Eliquis.   Home Medications    Current Outpatient Medications  Medication Sig Dispense Refill   acetaminophen (TYLENOL) 325 MG tablet Take 2 tablets  (650 mg total) by mouth every 4 (four) hours as needed for mild pain (or temp > 37.5 C (99.5 F)).     aspirin EC 81 MG tablet Take 1 tablet (81 mg total) by mouth daily. Swallow whole. (Patient not taking: Reported on 10/16/2022) 90 tablet 3   atorvastatin (LIPITOR) 40 MG tablet Take 40 mg by mouth daily.     baclofen (LIORESAL) 10 MG tablet Take 5 mg by mouth 3 (three) times daily.     FAMOTIDINE ORIG ST 10 MG tablet Take 10 mg by mouth daily.     folic acid (FOLVITE) 1 MG tablet Place 1 tablet (1 mg total) into feeding tube daily. (Patient taking differently: Take 1 mg by mouth daily.)     mirtazapine (REMERON) 15 MG tablet Take 15 mg by mouth at bedtime.     niacin 500 MG tablet Take 1 tablet (500 mg total) by mouth daily.     NUEDEXTA 20-10 MG capsule Take 1 capsule by mouth 2 (two) times daily.     sertraline (ZOLOFT) 25 MG tablet Take 25 mg by mouth in the morning.     sertraline (ZOLOFT) 50 MG tablet Take 50 mg by mouth in the  morning.     warfarin (COUMADIN) 4 MG tablet Take 4 mg by mouth daily at 6 PM.     acetaminophen (TYLENOL) 160 MG/5ML solution Place 20.3 mLs (650 mg total) into feeding tube every 4 (four) hours as needed for mild pain (or temp > 37.5 C (99.5 F)). (Patient not taking: Reported on 10/16/2022) 120 mL 0   Amino Acids-Protein Hydrolys (FEEDING SUPPLEMENT, PRO-STAT SUGAR FREE 64,) LIQD Place 30 mLs into feeding tube 2 (two) times daily. (Patient not taking: Reported on 10/16/2022) 900 mL 0   aspirin 81 MG chewable tablet Chew 81 mg by mouth daily.     cefUROXime (CEFTIN) 500 MG tablet Take 1 tablet (500 mg total) by mouth 2 (two) times daily with a meal. (Patient not taking: Reported on 10/16/2022)     enoxaparin (LOVENOX) 80 MG/0.8ML injection Inject 0.8 mLs (80 mg total) into the skin every 12 (twelve) hours. (Patient not taking: Reported on 10/16/2022) 0 mL 0   guaiFENesin (ROBITUSSIN) 100 MG/5ML liquid Take 10 mLs by mouth every 6 (six) hours as needed for cough or to loosen  phlegm.     insulin aspart (NOVOLOG) 100 UNIT/ML injection Inject 0-15 Units into the skin every 4 (four) hours. (Patient not taking: Reported on 10/16/2022) 10 mL 11   loratadine (CLARITIN REDITABS) 10 MG dissolvable tablet Take 10 mg by mouth daily.     multivitamin (PROSIGHT) TABS tablet Take 1 tablet by mouth daily. (Patient not taking: Reported on 10/16/2022) 30 each 0   Nutritional Supplements (FEEDING SUPPLEMENT, JEVITY 1.2 CAL,) LIQD Place 1,000 mLs into feeding tube continuous. (Patient not taking: Reported on 10/16/2022)  0   pantoprazole (PROTONIX) 40 MG tablet Take 1 tablet (40 mg total) by mouth daily. (Patient not taking: Reported on 10/16/2022)     QUEtiapine (SEROQUEL) 25 MG tablet Take 1 tablet (25 mg total) by mouth at bedtime. (Patient not taking: Reported on 10/16/2022)     senna-docusate (SENOKOT-S) 8.6-50 MG tablet Take 1 tablet by mouth at bedtime. (Patient not taking: Reported on 10/16/2022)     Sunscreens (SUNSCREEN KIDS SPF 50 EX) Apply 1 application  topically every 2 (two) hours as needed (for sun protection).     thiamine 100 MG tablet Take 100 mg by mouth daily.     warfarin (COUMADIN) 5 MG tablet Start with 5 mg qd, dose to be determined by Ritta Slot protocol (Patient not taking: Reported on 10/16/2022) 90 tablet 3   No current facility-administered medications for this visit.     Review of Systems    He denies chest pain, palpitations, dyspnea, pnd, orthopnea, n, v, dizziness, syncope, edema, weight gain, or early satiety. All other systems reviewed and are otherwise negative except as noted above.   Physical Exam    VS:  BP 104/61   Pulse 83   Temp 98.7 F (37.1 C)   Ht '5\' 10"'$  (1.778 m)   Wt 198 lb 9.6 oz (90.1 kg)   SpO2 97%   BMI 28.50 kg/m   GEN: Well nourished, well developed, grimacing in pain.  HEENT: normal. Neck: Supple, no JVD, carotid bruits, or masses. Cardiac: RRR, no murmurs, rubs, or gallops. No clubbing, cyanosis, edema.  Radials/DP/PT 2+ and equal  bilaterally.  Respiratory:  Respirations regular and unlabored, clear to auscultation bilaterally. GI: Soft, nontender, nondistended, BS + x 4. MS: no deformity or atrophy. Skin: warm and dry, no rash. Neuro: Expressive aphasia, right hemiparesis. Psych: Normal affect.  Accessory Clinical Findings  ECG personally reviewed by me today -sinus rhythm, 83 bpm, low voltage- no acute changes.   Lab Results  Component Value Date   WBC 11.0 (H) 10/16/2022   HGB 14.5 10/16/2022   HCT 44.2 10/16/2022   MCV 89.1 10/16/2022   PLT 300 10/16/2022   Lab Results  Component Value Date   CREATININE 0.89 10/16/2022   BUN 14 10/16/2022   NA 134 (L) 10/16/2022   K 3.8 10/16/2022   CL 101 10/16/2022   CO2 25 10/16/2022   Lab Results  Component Value Date   ALT 18 10/16/2022   AST 20 10/16/2022   ALKPHOS 71 10/16/2022   BILITOT 0.9 10/16/2022   Lab Results  Component Value Date   CHOL 131 10/29/2021   HDL 34 (L) 10/29/2021   LDLCALC 77 10/29/2021   LDLDIRECT 103.1 05/22/2007   TRIG 105 10/29/2021   CHOLHDL 3.9 10/29/2021    Lab Results  Component Value Date   HGBA1C 5.8 (H) 10/29/2021    Assessment & Plan    1. Acute abdominal pain: Patient presents today with acute right sided abdominal pain.  Patient is tearful, grimacing in pain, clenching the right side of his abdomen.  His pain seems to be worse with coughing and is episodic, mildly tender to palpation.  When asked about chest pain, he shakes his head "no." He appears euvolemic and well compensated on exam.  Vital signs are stable.  He is afebrile.  Discussed with Dr. Debara Pickett, DOD, who agrees patient would benefit from ED evaluation to determine source of symptoms given difficult history taking, likely need for imaging.  I explained this to the patient and he nodded in agreement.  Facility was notified of need for ED evaluation, they are unable to provide transportation.  Patient will be transported via non-emergent ambulance.    2. CAD: S/p STEMI, DES-LAD, DES-RCA in 2016. Stable with no anginal symptoms. No indication for ischemic evaluation.  Continue aspirin, Lipitor.  3. ICM: Most recent echo in 11/2021 showed EF 45 to 50%, mildly decreased LV function, LV apical thrombus, G1 DD, normal RV systolic function, no significant valvular abnormalities. Euvolemic and well compensated on exam.  Unfortunately, he has been off anticoagulation for some period of time, likely since January.  Would recommend transition to Eliquis.  4. LV apical thrombus: Recently transitioned from coumadin to Eliquis in the setting of non-adherence per Dr. Evette Georges notes. However, per pt's facility RN he has not been taking warfarin (likely since January 2024) and never started taking Eliquis.  Would recommend starting Eliquis 5 mg twice daily.  5. History of CVA: He has severe expressive aphasia, residual right hemiparesis.  Recommend initiation of Eliquis as above.  6. Hypertension: BP well controlled. Continue current antihypertensive regimen.   7. Hyperlipidemia: LDL was 77 in 10/2021. Due for repeat fasting lipids, LFTs.  Continue Lipitor.  8. Disposition: Follow-up pending ED evaluation.       Lenna Sciara, NP 10/16/2022, 6:11 PM

## 2022-10-16 NOTE — ED Triage Notes (Signed)
Patient BIB [facility] from heart doctor for routine checkup. Resident at [facility]. Has upper right quadrant abdominal pain. Right sided deficits from previous CVA that has also affected speech. No vomiting.

## 2022-10-16 NOTE — ED Provider Notes (Signed)
[facility] Provider Note   CSN: [id number] Arrival date & time: 10/16/22  1527     History  Chief Complaint  Patient presents with   Abdominal Pain    Henry David is a 62 y.o. male.  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  The patient was at his cardiologist appointment for your regularly scheduled appointment when he was noted to have severe right upper quadrant abdominal pain and was recommended to come to the emergency department for further evaluation.  History is limited due to patient's aphasia but he is able to tell me that he has had pain for the last 4 to 5 days.  He denies any nausea, vomiting, diarrhea or constipation, dysuria or hematuria, fevers or chills.  He denies associated chest pain or shortness of breath.  The history is provided by the patient and medical records. The history is limited by the condition of the patient (Level 5 caveat for aphasia).  Abdominal Pain      Home Medications Prior to Admission medications   Medication Sig Start Date End Date Taking? Authorizing Provider  acetaminophen (TYLENOL) 325 MG tablet Take 2 tablets (650 mg total) by mouth every 4 (four) hours as needed for mild pain (or temp > 37.5 C (99.5 F)). 06/03/17  Yes Cherene Altes, MD  aspirin 81 MG chewable tablet Chew 81 mg by mouth daily.   Yes [provider]  atorvastatin (LIPITOR) 40 MG tablet Take 40 mg by mouth daily.   Yes [provider]  baclofen (LIORESAL) 10 MG tablet Take 5 mg by mouth 3 (three) times daily. 12/21/20  Yes [provider]  FAMOTIDINE ORIG ST 10 MG tablet Take 10 mg by mouth daily. 12/23/20  Yes [provider]  folic acid (FOLVITE) 1 MG tablet Place 1 tablet (1 mg total) into feeding tube daily. Patient  taking differently: Take 1 mg by mouth daily. 06/04/17  Yes Cherene Altes, MD  guaiFENesin (ROBITUSSIN) 100 MG/5ML liquid Take 10 mLs by mouth every 6 (six) hours as needed for cough or to loosen phlegm.   Yes [provider]  loratadine (CLARITIN REDITABS) 10 MG dissolvable tablet Take 10 mg by mouth daily.   Yes [provider]  mirtazapine (REMERON) 15 MG tablet Take 15 mg by mouth at bedtime. 12/25/20  Yes [provider]  niacin 500 MG tablet Take 1 tablet (500 mg total) by mouth daily. 06/04/17  Yes Cherene Altes, MD  NUEDEXTA 20-10 MG capsule Take 1 capsule by mouth 2 (two) times daily. 12/21/20  Yes [provider]  sertraline (ZOLOFT) 25 MG tablet Take 25 mg by mouth in the morning. 11/29/20  Yes [provider]  sertraline (ZOLOFT) 50 MG tablet Take 50 mg by mouth in the morning. 08/12/22  Yes [provider]  Sunscreens (SUNSCREEN KIDS SPF 53 EX) Apply 1 application  topically every 2 (two) hours as needed (for sun protection).   Yes [provider]  thiamine 100 MG tablet Take 100 mg by mouth daily. 08/29/21  Yes [provider]  warfarin (COUMADIN) 4 MG tablet Take 4 mg by mouth daily at 6 PM.   Yes [provider]  acetaminophen (TYLENOL) 160 MG/5ML solution Place 20.3 mLs (650 mg total) into feeding tube every 4 (four) hours  as needed for mild pain (or temp > 37.5 C (99.5 F)). Patient not taking: Reported on 10/16/2022 06/03/17   Cherene Altes, MD  Amino Acids-Protein Hydrolys (FEEDING SUPPLEMENT, PRO-STAT SUGAR FREE 64,) LIQD Place 30 mLs into feeding tube 2 (two) times daily. Patient not taking: Reported on 10/16/2022 06/03/17   Cherene Altes, MD  aspirin EC 81 MG tablet Take 1 tablet (81 mg total) by mouth daily. Swallow whole. Patient not taking: Reported on 10/16/2022 12/26/20   Troy Sine, MD  cefUROXime (CEFTIN) 500 MG tablet Take 1 tablet (500 mg total) by mouth 2 (two) times daily  with a meal. Patient not taking: Reported on 10/16/2022 06/03/17   Cherene Altes, MD  enoxaparin (LOVENOX) 80 MG/0.8ML injection Inject 0.8 mLs (80 mg total) into the skin every 12 (twelve) hours. Patient not taking: Reported on 10/16/2022 01/29/21   Troy Sine, MD  insulin aspart (NOVOLOG) 100 UNIT/ML injection Inject 0-15 Units into the skin every 4 (four) hours. Patient not taking: Reported on 10/16/2022 06/03/17   Cherene Altes, MD  multivitamin (PROSIGHT) TABS tablet Take 1 tablet by mouth daily. Patient not taking: Reported on 10/16/2022 06/04/17   Cherene Altes, MD  Nutritional Supplements (FEEDING SUPPLEMENT, JEVITY 1.2 CAL,) LIQD Place 1,000 mLs into feeding tube continuous. Patient not taking: Reported on 10/16/2022 06/03/17   Cherene Altes, MD  pantoprazole (PROTONIX) 40 MG tablet Take 1 tablet (40 mg total) by mouth daily. Patient not taking: Reported on 10/16/2022 06/04/17   Cherene Altes, MD  QUEtiapine (SEROQUEL) 25 MG tablet Take 1 tablet (25 mg total) by mouth at bedtime. Patient not taking: Reported on 10/16/2022 06/03/17   Cherene Altes, MD  senna-docusate (SENOKOT-S) 8.6-50 MG tablet Take 1 tablet by mouth at bedtime. Patient not taking: Reported on 10/16/2022 06/03/17   Cherene Altes, MD  warfarin (COUMADIN) 5 MG tablet Start with 5 mg qd, dose to be determined by Ritta Slot protocol Patient not taking: Reported on 10/16/2022 01/29/21   Troy Sine, MD      Allergies    Coconut fatty acids    Review of Systems   Review of Systems  Gastrointestinal:  Positive for abdominal pain.    Physical Exam Updated Vital Signs BP 124/77   Pulse 73   Temp 98.6 F (37 C) (Oral)   Resp 20   SpO2 96%  Physical Exam Vitals and nursing note reviewed.  Constitutional:      General: He is not in acute distress.    Appearance: He is well-developed. He is obese.  HENT:     Head: Normocephalic and atraumatic.     Mouth/Throat:     Mouth: Mucous  membranes are moist.     Pharynx: Oropharynx is clear.  Eyes:     Extraocular Movements: Extraocular movements intact.  Cardiovascular:     Rate and Rhythm: Normal rate and regular rhythm.     Heart sounds: Normal heart sounds.  Pulmonary:     Effort: Pulmonary effort is normal.     Breath sounds: Normal breath sounds.  Abdominal:     General: Abdomen is flat.     Palpations: Abdomen is soft.     Tenderness: There is abdominal tenderness in the right upper quadrant. There is guarding. There is no right CVA tenderness, left CVA tenderness or rebound.  Skin:    General: Skin is warm and dry.  Neurological:     Mental Status: He is alert  and oriented to person, place, and time.     Comments: Expressive aphasia, RUE > RLE paraplegia  Psychiatric:        Mood and Affect: Mood normal.        Behavior: Behavior normal.     ED Results / Procedures / Treatments   Labs (all labs ordered are listed, but only abnormal results are displayed) Labs Reviewed  COMPREHENSIVE METABOLIC PANEL - Abnormal; Notable for the following components:      Result Value   Sodium 134 (*)    Glucose, Bld 115 (*)    Calcium 8.4 (*)    All other components within normal limits  CBC WITH DIFFERENTIAL/PLATELET - Abnormal; Notable for the following components:   WBC 11.0 (*)    Neutro Abs 8.1 (*)    All other components within normal limits  LIPASE, BLOOD  PROTIME-INR    EKG EKG Interpretation  Date/Time:  Wednesday October 16 2022 15:42:25 EST Ventricular Rate:  80 PR Interval:  148 QRS Duration: 90 QT Interval:  335 QTC Calculation: 387 R Axis:   85 Text Interpretation: Sinus rhythm Consider left atrial enlargement Borderline right axis deviation Low voltage, precordial leads No significant change since last tracing Confirmed by Leanord Asal ([id number]) on 10/16/2022 4:06:32 PM  Radiology US Abdomen Limited RUQ (LIVER/GB)  Result Date: 10/16/2022 CLINICAL DATA:  Right upper quadrant pain EXAM:  ULTRASOUND ABDOMEN LIMITED RIGHT UPPER QUADRANT COMPARISON:  None Available. FINDINGS: Gallbladder: Contracted gallbladder with suspected 2.8 cm gallstone. No pericholecystic fluid or findings to suggest acute cholecystitis. Common bile duct: Diameter: 4 mm Liver: At the upper limits of normal for parenchymal echogenicity. No focal hepatic lesion is seen. Portal vein is patent on color Doppler imaging with normal direction of blood flow towards the liver. Other: None. IMPRESSION: Cholelithiasis, without associated sonographic findings to suggest acute cholecystitis. Electronically Signed   By: Julian Hy M.D.   On: 10/16/2022 17:45   DG Chest 2 View  Result Date: 10/16/2022 CLINICAL DATA:  Right lower chest pain EXAM: CHEST - 2 VIEW COMPARISON:  Chest x-ray 05/30/2017 FINDINGS: Underinflation. Slightly elevated right hemidiaphragm. Normal cardiopericardial silhouette. No consolidation, pneumothorax or effusion. No edema. Overlapping cardiac leads. Films are under penetrated. Limited lateral view. IMPRESSION: Underinflation. Slightly elevated right hemidiaphragm. No acute cardiopulmonary disease Electronically Signed   By: Jill Side M.D.   On: 10/16/2022 17:31    Procedures Procedures    Medications Ordered in ED Medications  morphine (PF) 4 MG/ML injection 4 mg (4 mg Intravenous Given 10/16/22 1717)    ED Course/ Medical Decision Making/ A&P Clinical Course as of 10/16/22 1848  Wed Oct 16, 2022  1819 Patient's workup shows cholelithiasis without cholecystitis.  He has normal LFTs without signs of biliary obstruction.  Patient's pain is controlled.  He is stable for discharge home and will be given outpatient general surgery follow-up.  He was given strict return precautions. 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 The complaint involves an extensive differential diagnosis and also carries with it a high risk of complications and morbidity.    The differential  diagnosis includes cholelithiasis, cholecystitis, pancreatitis, pneumonia, pneumothorax, pulmonary edema, musculoskeletal pain, no rash making shingles unlikely, other intra-abdominal infection  Additional history obtained: Additional history obtained from N/A Records reviewed [facility] and outpatient cardiology records  ED Course and Reassessment: Patient was well-appearing on arrival no acute distress he is afebrile.  He does have point right upper quadrant tenderness to palpation and will have abdominal labs, chest x-ray and right upper quadrant ultrasound performed to evaluate for cause of his pain.  He was given morphine for pain control and will be closely reassessed.  Independent labs interpretation:  The following labs were independently interpreted: mild leukocytosis, otherwise within normal range  Independent visualization of imaging: - I independently visualized the following imaging with scope of interpretation limited to determining acute life threatening conditions related to emergency care: RUQ [place], which revealed cholelithiasis without cholecystitis  Consultation: - Consulted or discussed management/test interpretation w/ external professional: N/A  Consideration for admission or further workup: Patient has no emergent conditions requiring admission or further work-up at this time and is stable for discharge home with general surgery follow-up  Social Determinants of health: N/A    Amount and/or Complexity of Data Reviewed Labs: ordered. Radiology: ordered.  Risk Prescription drug management.          Final Clinical Impression(s) / ED Diagnoses Final diagnoses:  Calculus of gallbladder without  cholecystitis without obstruction    Rx / DC Orders ED Discharge Orders     None         Kemper Durie, DO 10/16/22 1848

## 2022-10-16 NOTE — ED Notes (Signed)
US at bedside

## 2022-12-04 ENCOUNTER — Ambulatory Visit: Payer: Medicaid Other | Admitting: Podiatry

## 2022-12-09 ENCOUNTER — Ambulatory Visit (INDEPENDENT_AMBULATORY_CARE_PROVIDER_SITE_OTHER): Payer: Commercial Managed Care - HMO | Admitting: Podiatry

## 2022-12-09 DIAGNOSIS — B351 Tinea unguium: Secondary | ICD-10-CM

## 2022-12-09 DIAGNOSIS — M79674 Pain in right toe(s): Secondary | ICD-10-CM

## 2022-12-09 DIAGNOSIS — M79675 Pain in left toe(s): Secondary | ICD-10-CM | POA: Diagnosis not present

## 2022-12-09 NOTE — Progress Notes (Signed)
   Chief Complaint  Patient presents with   RFC     SUBJECTIVE Patient with a history of stroke presents to office today complaining of elongated, thickened nails that cause pain while ambulating in shoes.  Patient is unable to trim their own nails. Patient is here for further evaluation and treatment.   Past Medical History:  Diagnosis Date   Anxiety    Apical mural thrombus    BPH (benign prostatic hyperplasia)    Depression    Diverticulitis    Elevated cholesterol    Essential hypertension    Post traumatic stress disorder    STEMI (ST elevation myocardial infarction) (HCC)     OBJECTIVE General Patient is awake, alert, and oriented x 3 and in no acute distress. Derm Skin is dry and supple bilateral. Negative open lesions or macerations. Remaining integument unremarkable. Nails are tender, long, thickened and dystrophic with subungual debris, consistent with onychomycosis, 1-5 bilateral. No signs of infection noted. Vasc  DP and PT pedal pulses palpable bilaterally. Temperature gradient within normal limits.  Neuro Epicritic and protective threshold sensation diminished bilaterally.  Musculoskeletal Exam No symptomatic pedal deformities noted bilateral. Muscular strength within normal limits.  ASSESSMENT 1. History of stroke 2.  Pain due to onychomycosis of toenails both  PLAN OF CARE 1. Patient evaluated today. 2. Instructed to maintain good pedal hygiene and foot care. Stressed importance of controlling blood sugar.  3. Mechanical debridement of nails 1-5 bilaterally performed using a nail nipper. Filed with dremel without incident.  4.  Return to clinic in 4 months    Felecia Shelling, DPM [facility]  Dr. Felecia Shelling, DPM    [address] [place], [place] [address]                Office [phone]  Fax [phone]

## 2023-03-11 NOTE — Telephone Encounter (Signed)
error 

## 2023-03-17 ENCOUNTER — Ambulatory Visit (INDEPENDENT_AMBULATORY_CARE_PROVIDER_SITE_OTHER): Payer: Medicaid Other | Admitting: Podiatry

## 2023-03-17 DIAGNOSIS — B351 Tinea unguium: Secondary | ICD-10-CM

## 2023-03-17 DIAGNOSIS — M79674 Pain in right toe(s): Secondary | ICD-10-CM

## 2023-03-17 DIAGNOSIS — M79675 Pain in left toe(s): Secondary | ICD-10-CM

## 2023-03-17 NOTE — Progress Notes (Signed)
   Chief Complaint  Patient presents with   Nail Problem    Nail trim    SUBJECTIVE Patient with a history of stroke presents to office today complaining of elongated, thickened nails that cause pain while ambulating in shoes.  Patient is unable to trim their own nails. Patient is here for further evaluation and treatment.   Past Medical History:  Diagnosis Date   Anxiety    Apical mural thrombus    BPH (benign prostatic hyperplasia)    Depression    Diverticulitis    Elevated cholesterol    Essential hypertension    Post traumatic stress disorder    STEMI (ST elevation myocardial infarction) (HCC)     OBJECTIVE General Patient is awake, alert, and oriented x 3 and in no acute distress. Derm Skin is dry and supple bilateral. Negative open lesions or macerations. Remaining integument unremarkable. Nails are tender, long, thickened and dystrophic with subungual debris, consistent with onychomycosis, 1-5 bilateral. No signs of infection noted. Vasc  DP and PT pedal pulses palpable bilaterally. Temperature gradient within normal limits.  Neuro Epicritic and protective threshold sensation diminished bilaterally.  Musculoskeletal Exam No symptomatic pedal deformities noted bilateral. Muscular strength within normal limits.  ASSESSMENT 1. History of stroke 2.  Pain due to onychomycosis of toenails both  PLAN OF CARE 1. Patient evaluated today. 2. Instructed to maintain good pedal hygiene and foot care. Stressed importance of controlling blood sugar.  3. Mechanical debridement of nails 1-5 bilaterally performed using a nail nipper. Filed with dremel without incident.  4.  Return to clinic in 4 months    Felecia Shelling, DPM [facility]  Dr. Felecia Shelling, DPM    [address] [place], [place] [address]                Office [phone]  Fax [phone]

## 2023-06-18 ENCOUNTER — Encounter: Payer: Self-pay | Admitting: Podiatry

## 2023-06-18 ENCOUNTER — Ambulatory Visit (INDEPENDENT_AMBULATORY_CARE_PROVIDER_SITE_OTHER): Payer: Medicaid Other | Admitting: Podiatry

## 2023-06-18 DIAGNOSIS — B351 Tinea unguium: Secondary | ICD-10-CM

## 2023-06-18 DIAGNOSIS — M79675 Pain in left toe(s): Secondary | ICD-10-CM | POA: Diagnosis not present

## 2023-06-18 DIAGNOSIS — M79674 Pain in right toe(s): Secondary | ICD-10-CM | POA: Diagnosis not present

## 2023-06-18 NOTE — Progress Notes (Signed)
   Chief Complaint  Patient presents with   [facility]    DFC-Nail Care     SUBJECTIVE Patient with a history of stroke presents to office today complaining of elongated, thickened nails that cause pain while ambulating in shoes.  Patient is unable to trim their own nails. Patient is here for further evaluation and treatment.   Past Medical History:  Diagnosis Date   Anxiety    Apical mural thrombus    BPH (benign prostatic hyperplasia)    Depression    Diverticulitis    Elevated cholesterol    Essential hypertension    Post traumatic stress disorder    STEMI (ST elevation myocardial infarction) (HCC)     OBJECTIVE General Patient is awake, alert, and oriented x 3 and in no acute distress. Derm Skin is dry and supple bilateral. Negative open lesions or macerations. Remaining integument unremarkable. Nails are tender, long, thickened and dystrophic with subungual debris, consistent with onychomycosis, 1-5 bilateral. No signs of infection noted. Vasc  DP and PT pedal pulses palpable bilaterally. Temperature gradient within normal limits.  Neuro Epicritic and protective threshold sensation diminished bilaterally.  Musculoskeletal Exam No symptomatic pedal deformities noted bilateral. Muscular strength within normal limits.  ASSESSMENT 1. History of stroke 2.  Pain due to onychomycosis of toenails both  PLAN OF CARE 1. Patient evaluated today. 2. Instructed to maintain good pedal hygiene and foot care. Stressed importance of controlling blood sugar.  3. Mechanical debridement of nails 1-5 bilaterally performed using a nail nipper. Filed with dremel without incident.  4.  Return to clinic in 4 months    Felecia Shelling, DPM [facility]  Dr. Felecia Shelling, DPM    [address] [place], [place] [address]                Office [phone]  Fax [phone]

## 2023-09-17 ENCOUNTER — Ambulatory Visit: Payer: Medicaid Other | Admitting: Podiatry

## 2023-10-22 ENCOUNTER — Encounter: Payer: Self-pay | Admitting: Neurology

## 2023-10-22 ENCOUNTER — Ambulatory Visit (INDEPENDENT_AMBULATORY_CARE_PROVIDER_SITE_OTHER): Payer: Medicaid Other | Admitting: Neurology

## 2023-10-22 VITALS — BP 113/74 | HR 63

## 2023-10-22 DIAGNOSIS — R4701 Aphasia: Secondary | ICD-10-CM | POA: Diagnosis not present

## 2023-10-22 DIAGNOSIS — Z8673 Personal history of transient ischemic attack (TIA), and cerebral infarction without residual deficits: Secondary | ICD-10-CM | POA: Diagnosis not present

## 2023-10-22 DIAGNOSIS — R252 Cramp and spasm: Secondary | ICD-10-CM

## 2023-10-22 DIAGNOSIS — G8191 Hemiplegia, unspecified affecting right dominant side: Secondary | ICD-10-CM | POA: Diagnosis not present

## 2023-10-22 NOTE — Patient Instructions (Signed)
 Continue current medications  Recommend physical therapy at least 3 days per week  Will follow up with Dr. Terrace Arabia regarding Botox injection for spasticity  Return in a year or sooner if worse

## 2023-10-22 NOTE — Progress Notes (Unsigned)
 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 Pt's cousin states that she would like to discuss F/U appointment and PT for patient. Pt's cousin would like pt to have another MRI.    INTERVAL HISTORY 10/22/2023:  Patient presents today for follow-up, last visit was a year ago; since then he is stable, has not had any additional events concerning for stroke.  He still at the nursing facility.  He is accompanied by cousin Anthoula today who tells me that he is not getting any physical therapy.  At last visit we have scheduled him for Botox but he did not show for his appointment due to difficulty getting a ride.  Overall he is stable, cousin would like to for patient to have Botox treatment and start physical therapy at the facility which required a new order.   INTERVAL HISTORY 07/17/22:  Patient presents today for follow-up.  He is alone.  He is globally aphasic so history unavailable.  I spoke with the facility, nurse Dawn who reported patient is overall stable.  Initially he was refusing his medications but as of now he has been compliant with the medications.  He is participating in physical activity.  Overall stable, no current concerns.   HISTORY OF PRESENT ILLNESS:  This is a 63 year old gentleman with past medical history of a left MCA stroke status post residual right hemiplegia and global aphasia, diabetes mellitus type 2, hypertension, hyperlipidemia and cardiac thrombus who is presenting to establish care.  History was obtained by cousin and also via chart review as patient is globally aphasic.  Per cousin patient was found down in 2018 home by his roommate.  He was taken to [facility] where he was found  to have a large left MCA territory stroke, etiology likely last left ICA occlusion.  He was not a tPA candidate at that time.  Per cousin after discharge patient was taken to rehab and then to a nursing home.  He has been in a living in a nursing home for the past 4 years.  Currently he is at [facility].  At baseline patient is aphasic, he can read and write to communicate at times.  He does have right side hemiplegia but is able to stand and able to ambulate with a device.  Currently he is not getting any therapy.     OTHER MEDICAL CONDITIONS: DMII, Hypertension, HLD, Cardiac thrombus    REVIEW OF SYSTEMS: Full 14 system review of systems performed and negative with exception of: unable to obtain due to severe aphasia   ALLERGIES: Allergies  Allergen Reactions   Coconut Fatty Acid Other (See Comments)    "Allergic," per paperwork from facility    HOME MEDICATIONS: Outpatient Medications Prior to Visit  Medication Sig Dispense Refill   atorvastatin (LIPITOR) 40 MG tablet Take 40 mg by mouth daily.     baclofen (LIORESAL) 10 MG tablet Take 5 mg by mouth 3 (three) times daily.     Dextromethorphan-quiNIDine (NUEDEXTA) 20-10 MG capsule Take 1 capsule by mouth.     ELIQUIS 5 MG TABS tablet      FAMOTIDINE ORIG ST 10 MG tablet Take 10 mg by mouth daily.     loratadine (  CLARITIN REDITABS) 10 MG dissolvable tablet Take 10 mg by mouth daily.     mirtazapine (REMERON) 15 MG tablet Take 15 mg by mouth at bedtime.     sertraline (ZOLOFT) 50 MG tablet Take 50 mg by mouth in the morning.     acetaminophen (TYLENOL) 160 MG/5ML solution Place 20.3 mLs (650 mg total) into feeding tube every 4 (four) hours as needed for mild pain (or temp > 37.5 C (99.5 F)). (Patient not taking: Reported on 10/16/2022) 120 mL 0   acetaminophen (TYLENOL) 325 MG tablet Take 2 tablets (650 mg total) by mouth every 4 (four) hours as needed for mild pain (or temp > 37.5 C (99.5 F)).     Amino Acids-Protein Hydrolys  (FEEDING SUPPLEMENT, PRO-STAT SUGAR FREE 64,) LIQD Place 30 mLs into feeding tube 2 (two) times daily. (Patient not taking: Reported on 10/16/2022) 900 mL 0   aspirin 81 MG chewable tablet Chew 81 mg by mouth daily.     aspirin EC 81 MG tablet Take 1 tablet (81 mg total) by mouth daily. Swallow whole. (Patient not taking: Reported on 10/16/2022) 90 tablet 3   cefUROXime (CEFTIN) 500 MG tablet Take 1 tablet (500 mg total) by mouth 2 (two) times daily with a meal. (Patient not taking: Reported on 10/16/2022)     enoxaparin (LOVENOX) 80 MG/0.8ML injection Inject 0.8 mLs (80 mg total) into the skin every 12 (twelve) hours. (Patient not taking: Reported on 10/16/2022) 0 mL 0   folic acid (FOLVITE) 1 MG tablet Place 1 tablet (1 mg total) into feeding tube daily. (Patient not taking: Reported on 10/22/2023)     guaiFENesin (ROBITUSSIN) 100 MG/5ML liquid Take 10 mLs by mouth every 6 (six) hours as needed for cough or to loosen phlegm. (Patient not taking: Reported on 10/22/2023)     insulin aspart (NOVOLOG) 100 UNIT/ML injection Inject 0-15 Units into the skin every 4 (four) hours. (Patient not taking: Reported on 10/16/2022) 10 mL 11   multivitamin (PROSIGHT) TABS tablet Take 1 tablet by mouth daily. (Patient not taking: Reported on 10/16/2022) 30 each 0   niacin 500 MG tablet Take 1 tablet (500 mg total) by mouth daily. (Patient not taking: Reported on 10/22/2023)     NUEDEXTA 20-10 MG capsule Take 1 capsule by mouth 2 (two) times daily. (Patient not taking: Reported on 10/22/2023)     Nutritional Supplements (FEEDING SUPPLEMENT, JEVITY 1.2 CAL,) LIQD Place 1,000 mLs into feeding tube continuous. (Patient not taking: Reported on 10/16/2022)  0   pantoprazole (PROTONIX) 40 MG tablet Take 1 tablet (40 mg total) by mouth daily. (Patient not taking: Reported on 10/16/2022)     QUEtiapine (SEROQUEL) 25 MG tablet Take 1 tablet (25 mg total) by mouth at bedtime. (Patient not taking: Reported on 10/16/2022)     senna-docusate (SENOKOT-S)  8.6-50 MG tablet Take 1 tablet by mouth at bedtime. (Patient not taking: Reported on 10/16/2022)     sertraline (ZOLOFT) 25 MG tablet Take 25 mg by mouth in the morning. (Patient not taking: Reported on 10/22/2023)     Sunscreens (SUNSCREEN KIDS SPF 50 EX) Apply 1 application  topically every 2 (two) hours as needed (for sun protection). (Patient not taking: Reported on 10/22/2023)     thiamine 100 MG tablet Take 100 mg by mouth daily. (Patient not taking: Reported on 10/22/2023)     warfarin (COUMADIN) 4 MG tablet Take 4 mg by mouth daily at 6 PM. (Patient not taking: Reported on 10/22/2023)  warfarin (COUMADIN) 5 MG tablet Start with 5 mg qd, dose to be determined by Joetta Manners protocol (Patient not taking: Reported on 10/16/2022) 90 tablet 3   No facility-administered medications prior to visit.    PAST MEDICAL HISTORY: Past Medical History:  Diagnosis Date   Anxiety    Apical mural thrombus    BPH (benign prostatic hyperplasia)    Depression    Diverticulitis    Elevated cholesterol    Essential hypertension    Post traumatic stress disorder    STEMI (ST elevation myocardial infarction) (HCC)     PAST SURGICAL HISTORY: Past Surgical History:  Procedure Laterality Date   CARDIAC CATHETERIZATION N/A 07/09/2015   Procedure: Left Heart Cath and Coronary Angiography;  Surgeon: Lennette Bihari, MD;  Location: [facility] INVASIVE CV LAB;  Service: Cardiovascular;  Laterality: N/A;   CARDIAC CATHETERIZATION N/A 07/09/2015   Procedure: Coronary Stent Intervention;  Surgeon: Lennette Bihari, MD;  Location: [facility] INVASIVE CV LAB;  Service: Cardiovascular;  Laterality: N/A;   CARDIAC CATHETERIZATION N/A 07/11/2015   Procedure: Coronary Stent Intervention;  Surgeon: Lennette Bihari, MD;  Location: [facility] INVASIVE CV [facility];  Service: Cardiovascular;  Laterality: N/A;   ESOPHAGOGASTRODUODENOSCOPY N/A 05/29/2017   Procedure: ESOPHAGOGASTRODUODENOSCOPY (EGD);  Surgeon: Jimmye Norman, MD;  Location: [facility] ENDOSCOPY;   Service: General;  Laterality: N/A;   KNEE SURGERY     PEG PLACEMENT N/A 05/29/2017   Procedure: PERCUTANEOUS ENDOSCOPIC GASTROSTOMY (PEG) PLACEMENT;  Surgeon: Jimmye Norman, MD;  Location: [facility] ENDOSCOPY;  Service: General;  Laterality: N/A;    FAMILY HISTORY: Family History  Problem Relation Age of Onset   Heart disease Mother    Heart disease Father     SOCIAL HISTORY: Social History   Socioeconomic History   Marital status: Single    Spouse name: Not on file   Number of children: Not on file   Years of education: Not on file   Highest education level: Not on file  Occupational History   Not on file  Tobacco Use   Smoking status: Former    Current packs/day: 0.00    Types: Cigarettes    Quit date: 07/04/2015    Years since quitting: 8.3   Smokeless tobacco: Former   Tobacco comments:    Nov. 27 2016  Substance and Sexual Activity   Alcohol use: Yes    Alcohol/week: 4.0 standard drinks of alcohol    Types: 4 Cans of beer per week    Comment: occassional use   Drug use: No   Sexual activity: Not Currently  Other Topics Concern   Not on file  Social History Narrative   Not on file   Social Drivers of Health   Financial Resource Strain: Not on file  Food Insecurity: No Food Insecurity (04/06/2021)   Hunger Vital Sign    Worried About Running Out of Food in the Last Year: Never true    Ran Out of Food in the Last Year: Never true  Transportation Needs: Not on file  Physical Activity: Not on file  Stress: Not on file  Social Connections: Not on file  Intimate Partner Violence: Not on file    PHYSICAL EXAM  GENERAL EXAM/CONSTITUTIONAL: Vitals:  Vitals:   10/22/23 1102  BP: 113/74  Pulse: 63     There is no height or weight on file to calculate BMI. Wt Readings from Last 3 Encounters:  10/16/22 198 lb 9.6 oz (90.1 kg)  11/02/21 193 lb 3.2 oz (87.6 kg)  08/07/21  185 lb (83.9 kg)   Patient is in no distress; well developed, nourished and groomed; neck  is supple, sitting in his wheelchair.   MUSCULOSKELETAL: Gait, strength, tone, movements noted in Neurologic exam below  NEUROLOGIC: MENTAL STATUS:      No data to display         Globally aphasic,     CRANIAL NERVE:  2nd, 3rd, 4th, 6th - able to track examiner, extraocular muscles intact, no nystagmus 5th - Decrease sensation to right side of face noted as in extinction to double stimulus 7th - facial strength symmetric 8th - hearing intact 9th - palate elevates symmetrically, uvula midline 11th - shoulder shrug symmetric 12th - tongue protrusion midline  MOTOR:  normal bulk and tone, full strength in the LUE and LLE. RUE is plegic, there is contraction at the shoulder and elbow. RLE is 4/5   SENSORY:  Extinction to double stimulus on the right.   COORDINATION:  Not perform due to aphasia  GAIT/STATION:  Deferred    DIAGNOSTIC DATA (LABS, IMAGING, TESTING) - I reviewed patient records, labs, notes, testing and imaging myself where available.  Lab Results  Component Value Date   WBC 11.0 (H) 10/16/2022   HGB 14.5 10/16/2022   HCT 44.2 10/16/2022   MCV 89.1 10/16/2022   PLT 300 10/16/2022      Component Value Date/Time   NA 134 (L) 10/16/2022 1717   NA 141 10/29/2021 1206   K 3.8 10/16/2022 1717   CL 101 10/16/2022 1717   CO2 25 10/16/2022 1717   GLUCOSE 115 (H) 10/16/2022 1717   BUN 14 10/16/2022 1717   BUN 13 10/29/2021 1206   CREATININE 0.89 10/16/2022 1717   CREATININE 1.05 03/08/2016 1116   CALCIUM 8.4 (L) 10/16/2022 1717   PROT 7.6 10/16/2022 1717   PROT 7.1 10/29/2021 1206   ALBUMIN 3.6 10/16/2022 1717   ALBUMIN 4.4 10/29/2021 1206   AST 20 10/16/2022 1717   ALT 18 10/16/2022 1717   ALKPHOS 71 10/16/2022 1717   BILITOT 0.9 10/16/2022 1717   BILITOT 0.7 10/29/2021 1206   GFRNONAA >60 10/16/2022 1717   GFRNONAA 79 03/08/2016 1116   GFRAA >60 06/03/2017 0518   GFRAA >89 03/08/2016 1116   Lab Results  Component Value Date   CHOL 131  10/29/2021   HDL 34 (L) 10/29/2021   LDLCALC 77 10/29/2021   LDLDIRECT 103.1 05/22/2007   TRIG 105 10/29/2021   CHOLHDL 3.9 10/29/2021   Lab Results  Component Value Date   HGBA1C 5.8 (H) 10/29/2021   Lab Results  Component Value Date   VITAMINB12 440 05/22/2007   Lab Results  Component Value Date   TSH 2.600 10/29/2021    CT Head 05/26/2017 1. Continued interval evolution of large left MCA territory infarct, with decreasing mass effect and edema as compared to most recent CT from 05/24/2017. Left-to-right midline shift is improved now measuring 7 mm (previously 14 mm). 2. Grossly stable petechial hemorrhage as seen by previous MRI. No evidence for malignant hemorrhagic transformation. 3. No new intracranial abnormality   CTA Head and neck 05/16/2017 1. 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 2. No significant collateral flow to the left MCA territory. 3. Patent anterior communicating artery with retrograde filling of the left A1 segment. 4. Minimal atherosclerotic calcification within the cavernous right internal carotid artery. 5. No other significant atherosclerotic disease within the head or neck. 6. Degenerative changes  of the cervical spine are most pronounced at C6-7   CT Head Dec 18, 2021 Large left cerebral encephalomalacia. No evidence of large-territorial acute infarction. No parenchymal hemorrhage. No mass lesion. No extra-axial collection   ASSESSMENT AND PLAN  63 y.o. year old male with past medical history of a left MCA stroke in 2018 with residual aphasia and right hemiplegia, diabetes mellitus type 2, hypertension, hyperlipidemia and history of cardiac thrombus who is presenting for follow up. He is stable, compliant with his medications.  Will write him a new order for physical therapy.  We will also get him Botox treatment for his right upper extremity spasticity, Anthoula reports that  she will arrange for transport.  I will see him in a year for follow-up or sooner if worse.    1. Chronic ischemic left MCA stroke   2. Right hemiplegia (HCC)   3. Global aphasia   4. Spasticity as late effect of cerebrovascular accident (CVA)      Patient Instructions  Continue current medications  Recommend physical therapy at least 3 days per week  Will follow up with Dr. Terrace Arabia regarding Botox injection for spasticity  Return in a year or sooner if worse    Orders Placed This Encounter  Procedures   Ambulatory referral to Physical Therapy    No orders of the defined types were placed in this encounter.   Return in about 1 year (around 10/21/2024).  I have spent a total of 35 minutes dedicated to this patient today, preparing to see patient, performing a medically appropriate examination and evaluation, ordering tests and/or medications and procedures, and counseling and educating the patient/family/caregiver; independently interpreting result and communicating results to the family/patient/caregiver; and documenting clinical information in the electronic medical record.   Windell Norfolk, MD 10/23/2023, 8:16 AM  [facility] [address] [place], [place] [address] [phone]

## 2023-11-17 ENCOUNTER — Ambulatory Visit (INDEPENDENT_AMBULATORY_CARE_PROVIDER_SITE_OTHER): Admitting: Podiatry

## 2023-11-17 ENCOUNTER — Encounter: Payer: Self-pay | Admitting: Podiatry

## 2023-11-17 DIAGNOSIS — M79674 Pain in right toe(s): Secondary | ICD-10-CM | POA: Diagnosis not present

## 2023-11-17 DIAGNOSIS — B351 Tinea unguium: Secondary | ICD-10-CM

## 2023-11-17 DIAGNOSIS — M79675 Pain in left toe(s): Secondary | ICD-10-CM | POA: Diagnosis not present

## 2023-11-17 NOTE — Progress Notes (Signed)
   Chief Complaint  Patient presents with   Nail Problem    Patient is here for RFC/ Nail trim    SUBJECTIVE Patient with a history of stroke presents to office today complaining of elongated, thickened nails that cause pain while ambulating in shoes.  Patient is unable to trim their own nails. Patient is here for further evaluation and treatment.   Past Medical History:  Diagnosis Date   Anxiety    Apical mural thrombus    BPH (benign prostatic hyperplasia)    Depression    Diverticulitis    Elevated cholesterol    Essential hypertension    Post traumatic stress disorder    STEMI (ST elevation myocardial infarction) (HCC)     OBJECTIVE General Patient is awake, alert, and oriented x 3 and in no acute distress. Derm Skin is dry and supple bilateral. Negative open lesions or macerations. Remaining integument unremarkable. Nails are tender, long, thickened and dystrophic with subungual debris, consistent with onychomycosis, 1-5 bilateral. No signs of infection noted. Vasc  DP and PT pedal pulses palpable bilaterally. Temperature gradient within normal limits.  Neuro Epicritic and protective threshold sensation diminished bilaterally.  Musculoskeletal Exam No symptomatic pedal deformities noted bilateral. Muscular strength within normal limits.  ASSESSMENT 1. History of stroke 2.  Pain due to onychomycosis of toenails both  PLAN OF CARE 1. Patient evaluated today. 2. Instructed to maintain good pedal hygiene and foot care. Stressed importance of controlling blood sugar.  3. Mechanical debridement of nails 1-5 bilaterally performed using a nail nipper. Filed with dremel without incident.  4.  Return to clinic in 4 months    Felecia Shelling, DPM [facility]  Dr. Felecia Shelling, DPM    [address] [place], [place] [address]                Office [phone]  Fax [phone]

## 2024-02-23 ENCOUNTER — Ambulatory Visit: Admitting: Podiatry

## 2024-03-03 ENCOUNTER — Ambulatory Visit: Admitting: Podiatry

## 2024-03-17 ENCOUNTER — Ambulatory Visit: Admitting: Podiatry

## 2024-06-08 ENCOUNTER — Telehealth: Payer: Self-pay | Admitting: Nurse Practitioner

## 2024-06-08 NOTE — Telephone Encounter (Signed)
 Patient has to have appt for tomorrow r/s. Called [facility] twice to reschedule appointment but phone keeps ringing and noone picks up. Called patients cousin who is on DPR, Anthoula, and she states that we need to call Jame, I advised her that I have already tried calling 2x and she said she would try to give them a call and have them call the office.

## 2024-06-09 ENCOUNTER — Ambulatory Visit: Admitting: Nurse Practitioner

## 2024-08-26 ENCOUNTER — Emergency Department (HOSPITAL_COMMUNITY)
Admission: EM | Admit: 2024-08-26 | Discharge: 2024-08-27 | Disposition: A | Discharge status: Home / Self Care | Source: Other Acute Inpatient Hospital | Attending: Emergency Medicine | Admitting: Emergency Medicine

## 2024-08-26 ENCOUNTER — Other Ambulatory Visit: Payer: Self-pay

## 2024-08-26 ENCOUNTER — Emergency Department (HOSPITAL_COMMUNITY)

## 2024-08-26 DIAGNOSIS — Z87891 Personal history of nicotine dependence: Secondary | ICD-10-CM | POA: Insufficient documentation

## 2024-08-26 DIAGNOSIS — R079 Chest pain, unspecified: Secondary | ICD-10-CM | POA: Insufficient documentation

## 2024-08-26 DIAGNOSIS — Z7982 Long term (current) use of aspirin: Secondary | ICD-10-CM | POA: Insufficient documentation

## 2024-08-26 DIAGNOSIS — Z7901 Long term (current) use of anticoagulants: Secondary | ICD-10-CM | POA: Insufficient documentation

## 2024-08-26 DIAGNOSIS — Z794 Long term (current) use of insulin: Secondary | ICD-10-CM | POA: Diagnosis not present

## 2024-08-26 LAB — CBC
HCT: 45.9 % (ref 39.0–52.0)
Hemoglobin: 15.1 g/dL (ref 13.0–17.0)
MCH: 28.8 pg (ref 26.0–34.0)
MCHC: 32.9 g/dL (ref 30.0–36.0)
MCV: 87.4 fL (ref 80.0–100.0)
Platelets: 271 K/uL (ref 150–400)
RBC: 5.25 MIL/uL (ref 4.22–5.81)
RDW: 13.6 % (ref 11.5–15.5)
WBC: 13.3 K/uL — ABNORMAL HIGH (ref 4.0–10.5)
nRBC: 0 % (ref 0.0–0.2)

## 2024-08-26 LAB — BASIC METABOLIC PANEL WITH GFR
Anion gap: 13 (ref 5–15)
BUN: 15 mg/dL (ref 8–23)
CO2: 22 mmol/L (ref 22–32)
Calcium: 9 mg/dL (ref 8.9–10.3)
Chloride: 103 mmol/L (ref 98–111)
Creatinine, Ser: 0.96 mg/dL (ref 0.61–1.24)
GFR, Estimated: >60 mL/min
Glucose, Bld: 117 mg/dL — ABNORMAL HIGH (ref 70–99)
Potassium: 4.7 mmol/L (ref 3.5–5.1)
Sodium: 138 mmol/L (ref 135–145)

## 2024-08-26 LAB — TROPONIN T, HIGH SENSITIVITY: Troponin T High Sensitivity: 21 ng/L — ABNORMAL HIGH (ref 0–19)

## 2024-08-26 LAB — I-STAT CHEM 8, ED
BUN: 15 mg/dL (ref 8–23)
Calcium, Ion: 1.11 mmol/L — ABNORMAL LOW (ref 1.15–1.40)
Chloride: 104 mmol/L (ref 98–111)
Creatinine, Ser: 1 mg/dL (ref 0.61–1.24)
Glucose, Bld: 118 mg/dL — ABNORMAL HIGH (ref 70–99)
HCT: 45 % (ref 39.0–52.0)
Hemoglobin: 15.3 g/dL (ref 13.0–17.0)
Potassium: 4.6 mmol/L (ref 3.5–5.1)
Sodium: 141 mmol/L (ref 135–145)
TCO2: 24 mmol/L (ref 22–32)

## 2024-08-26 LAB — CBG MONITORING, ED: Glucose-Capillary: 129 mg/dL — ABNORMAL HIGH (ref 70–99)

## 2024-08-26 MED ORDER — ASPIRIN 81 MG PO CHEW
324.0000 mg | CHEWABLE_TABLET | Freq: Once | ORAL | Status: AC
  Administered 2024-08-26: 324 mg via ORAL
  Filled 2024-08-26: qty 4

## 2024-08-26 NOTE — ED Triage Notes (Signed)
 Pt BIB [facility] from [place]. Pt C/O chest pain and SHOB. Pt is non-verbal with Hx of R sided deficits.

## 2024-08-27 ENCOUNTER — Emergency Department (HOSPITAL_COMMUNITY)

## 2024-08-27 DIAGNOSIS — R079 Chest pain, unspecified: Secondary | ICD-10-CM | POA: Diagnosis not present

## 2024-08-27 LAB — TROPONIN T, HIGH SENSITIVITY: Troponin T High Sensitivity: 19 ng/L (ref 0–19)

## 2024-08-27 MED ORDER — IOHEXOL 350 MG/ML SOLN
75.0000 mL | Freq: Once | INTRAVENOUS | Status: AC | PRN
  Administered 2024-08-27: 75 mL via INTRAVENOUS

## 2024-08-27 MED ORDER — ACETAMINOPHEN 500 MG PO TABS
1000.0000 mg | ORAL_TABLET | Freq: Once | ORAL | Status: AC
  Administered 2024-08-27: 1000 mg via ORAL
  Filled 2024-08-27: qty 2

## 2024-08-27 NOTE — Discharge Instructions (Signed)
 Your workup this evening was reassuring.  I recommend following up with your primary care provider and your cardiologist for further evaluation.  If you develop worsening symptoms or life-threatening symptoms please return to the emergency department.

## 2024-08-27 NOTE — ED Notes (Signed)
 Attempted to call report to facility 2x

## 2024-08-27 NOTE — ED Provider Notes (Signed)
 " [facility] EMERG[facility]Y DE[facility]MENT AT [facility] Provider Note   CSN: [id number] Arrival date & time: 08/26/24  2124     Patient presents with: Chest Pain   Henry David is a 64 y.o. male.  Patient with past medical history significant for right-sided deficits post CVA, aphasia, PTSD, STEMI presents to the emergency department via EMS from [facility].  Patient indicated he had chest pain with some shortness of breath.  Patient is unable to elaborate as to how long this has been ongoing as he is only able to nod yes and no to questions.  He shakes his head no when asked about nausea, vomiting, abdominal pain, numbness, radiation of pain into arm or jaw.    Chest Pain      Prior to Admission medications  Medication Sig Start Date End Date Taking? Authorizing Provider  acetaminophen  (TYLENOL ) 160 MG/5ML solution Place 20.3 mLs (650 mg total) into feeding tube every 4 (four) hours as needed for mild pain (or temp > 37.5 C (99.5 F)). Patient not taking: Reported on 10/16/2022 06/03/17   Danton Reyes DASEN, MD  acetaminophen  (TYLENOL ) 325 MG tablet Take 2 tablets (650 mg total) by mouth every 4 (four) hours as needed for mild pain (or temp > 37.5 C (99.5 F)). 06/03/17   Danton Reyes DASEN, MD  Amino Acids -Protein Hydrolys (FEEDING SUPPLEMENT, PRO-STAT SUGAR FREE 64,) LIQD Place 30 mLs into feeding tube 2 (two) times daily. Patient not taking: Reported on 10/16/2022 06/03/17   Danton Reyes DASEN, MD  aspirin  81 MG chewable tablet Chew 81 mg by mouth daily.    [provider]  aspirin  EC 81 MG tablet Take 1 tablet (81 mg total) by mouth daily. Swallow whole. Patient not taking: Reported on 10/16/2022 12/26/20   Burnard Debby LABOR, MD  atorvastatin  (LIPITOR ) 40 MG tablet Take 40 mg by mouth daily.    [provider]  baclofen (LIORESAL) 10 MG tablet Take 5 mg by mouth 3 (three) times daily. 12/21/20   [provider]  cefUROXime  (CEFTIN ) 500 MG  tablet Take 1 tablet (500 mg total) by mouth 2 (two) times daily with a meal. Patient not taking: Reported on 10/16/2022 06/03/17   Danton Reyes DASEN, MD  Dextromethorphan-quiNIDine (NUEDEXTA) 20-10 MG capsule Take 1 capsule by mouth.    [provider]  ELIQUIS 5 MG TABS tablet  06/27/23   [provider]  enoxaparin  (LOVENOX ) 80 MG/0.8ML injection Inject 0.8 mLs (80 mg total) into the skin every 12 (twelve) hours. Patient not taking: Reported on 10/16/2022 01/29/21   Burnard Debby LABOR, MD  FAMOTIDINE  ORIG ST 10 MG tablet Take 10 mg by mouth daily. 12/23/20   [provider]  folic acid  (FOLVITE ) 1 MG tablet Place 1 tablet (1 mg total) into feeding tube daily. Patient not taking: Reported on 10/22/2023 06/04/17   Danton Reyes DASEN, MD  guaiFENesin (ROBITUSSIN) 100 MG/5ML liquid Take 10 mLs by mouth every 6 (six) hours as needed for cough or to loosen phlegm. Patient not taking: Reported on 10/22/2023    [provider]  insulin  aspart (NOVOLOG ) 100 UNIT/ML injection Inject 0-15 Units into the skin every 4 (four) hours. Patient not taking: Reported on 10/16/2022 06/03/17   Danton Reyes DASEN, MD  loratadine (CLARITIN REDITABS) 10 MG dissolvable tablet Take 10 mg by mouth daily.    [provider]  mirtazapine (REMERON) 15 MG tablet Take 15 mg by mouth at bedtime. 12/25/20   [provider]  multivitamin (PROSIGHT) TABS tablet Take 1 tablet by mouth daily. Patient not taking: Reported on 10/16/2022 06/04/17   Danton Reyes DASEN, MD  niacin  500 MG tablet Take 1 tablet (500 mg total) by mouth daily. Patient not taking: Reported on 10/22/2023 06/04/17   Danton Reyes DASEN, MD  NUEDEXTA 20-10 MG capsule Take 1 capsule by mouth 2 (two) times daily. Patient not taking: Reported on 10/22/2023 12/21/20   [provider]  Nutritional Supplements (FEEDING SUPPLEMENT, JEVITY 1.2 CAL,) LIQD Place 1,000 mLs into feeding tube continuous. Patient not taking:  Reported on 10/16/2022 06/03/17   Danton Reyes DASEN, MD  pantoprazole  (PROTONIX ) 40 MG tablet Take 1 tablet (40 mg total) by mouth daily. Patient not taking: Reported on 10/16/2022 06/04/17   Danton Reyes DASEN, MD  QUEtiapine  (SEROQUEL ) 25 MG tablet Take 1 tablet (25 mg total) by mouth at bedtime. Patient not taking: Reported on 10/16/2022 06/03/17   Danton Reyes DASEN, MD  senna-docusate (SENOKOT-S) 8.6-50 MG tablet Take 1 tablet by mouth at bedtime. Patient not taking: Reported on 10/16/2022 06/03/17   Danton Reyes DASEN, MD  sertraline  (ZOLOFT ) 25 MG tablet Take 25 mg by mouth in the morning. Patient not taking: Reported on 10/22/2023 11/29/20   [provider]  sertraline  (ZOLOFT ) 50 MG tablet Take 50 mg by mouth in the morning. 08/12/22   [provider]  Sunscreens (SUNSCREEN KIDS SPF 50 EX) Apply 1 application  topically every 2 (two) hours as needed (for sun protection). Patient not taking: Reported on 10/22/2023    [provider]  thiamine  100 MG tablet Take 100 mg by mouth daily. Patient not taking: Reported on 10/22/2023 08/29/21   [provider]  warfarin (COUMADIN ) 4 MG tablet Take 4 mg by mouth daily at 6 PM. Patient not taking: Reported on 10/22/2023    [provider]  warfarin (COUMADIN ) 5 MG tablet Start with 5 mg qd, dose to be determined by Jame protocol Patient not taking: Reported on 10/16/2022 01/29/21   Burnard Debby LABOR, MD    Allergies: Coconut fatty acid    Review of Systems  Cardiovascular:  Positive for chest pain.    Updated Vital Signs BP (!) 105/58 (BP Location: Left Arm)   Pulse 76   Temp 97.7 F (36.5 C)   Resp 16   SpO2 96%   Physical Exam Vitals and nursing note reviewed.  Constitutional:      General: He is not in acute distress.    Appearance: He is well-developed. He is obese.  HENT:     Head: Normocephalic and atraumatic.     Mouth/Throat:     Mouth: Mucous membranes are moist.     Pharynx: Oropharynx  is clear.  Eyes:     Extraocular Movements: Extraocular movements intact.  Cardiovascular:     Rate and Rhythm: Normal rate and regular rhythm.     Heart sounds: Normal heart sounds.  Pulmonary:     Effort: Pulmonary effort is normal.     Breath sounds: Normal breath sounds.  Abdominal:     General: Abdomen is flat.     Palpations: Abdomen is soft.     Tenderness: There is no abdominal tenderness. There is no right CVA tenderness or left CVA tenderness.  Skin:    General: Skin is warm and dry.  Neurological:     Mental Status: He is alert and oriented to person, place, and time.     Comments: Expressive aphasia, RUE >  RLE paraplegia  Psychiatric:        Mood and Affect: Mood normal.        Behavior: Behavior normal.     (all labs ordered are listed, but only abnormal results are displayed) Labs Reviewed  BASIC METABOLIC PANEL WITH GFR - Abnormal; Notable for the following components:      Result Value   Glucose, Bld 117 (*)    All other components within normal limits  CBC - Abnormal; Notable for the following components:   WBC 13.3 (*)    All other components within normal limits  CBG MONITORING, ED - Abnormal; Notable for the following components:   Glucose-Capillary 129 (*)    All other components within normal limits  I-STAT CHEM 8, ED - Abnormal; Notable for the following components:   Glucose, Bld 118 (*)    Calcium , Ion 1.11 (*)    All other components within normal limits  TROPONIN T, HIGH SENSITIVITY - Abnormal; Notable for the following components:   Troponin T High Sensitivity 21 (*)    All other components within normal limits  TROPONIN T, HIGH SENSITIVITY    EKG: None  Radiology: CT Angio Chest PE W and/or Wo Contrast Result Date: 08/27/2024 EXAM: CTA CHEST 08/27/2024 12:26:42 AM TECHNIQUE: CTA of the chest was performed after the administration of intravenous contrast. Multiplanar reformatted images are provided for review. MIP images are provided for  review. Automated exposure control, iterative reconstruction, and/or weight based adjustment of the mA/kV was utilized to reduce the radiation dose to as low as reasonably achievable. COMPARISON: None available. CLINICAL HISTORY: Chest pain, dyspnea, bed bound, nonverbal. FINDINGS: PULMONARY ARTERIES: Pulmonary arteries are adequately opacified for evaluation. No acute pulmonary embolus. Main pulmonary artery is normal in caliber. MEDIASTINUM: Extensive multivessel coronary artery calcifications. The heart and pericardium demonstrate no acute abnormality. There is no acute abnormality of the thoracic aorta. LYMPH NODES: No mediastinal, hilar or axillary lymphadenopathy. LUNGS AND PLEURA: Bibasilar atelectasis. No confluent pulmonary infiltrate. No focal consolidation or pulmonary edema. No evidence of pleural effusion or pneumothorax. UPPER ABDOMEN: Small hiatal hernia. Limited images of the upper abdomen are otherwise unremarkable. SOFT TISSUES AND BONES: Osseous structures are age appropriate. No acute bone abnormality. No lytic or blastic bone lesion. No acute soft tissue abnormality. IMPRESSION: 1. No pulmonary embolism or acute pulmonary abnormality. 2. Extensive multivessel coronary artery calcifications. 3. Small hiatal hernia. Electronically signed by: Dorethia Molt MD 08/27/2024 12:31 AM EST RP Workstation: [id number]   DG Chest Portable 1 View Result Date: 08/26/2024 EXAM: 1 VIEW(S) XRAY OF THE CHEST 08/26/2024 10:05:00 PM COMPARISON: 10/15/2021 CLINICAL HISTORY: FINDINGS: LINES, TUBES AND DEVICES: EKG leads artifact noted. LUNGS AND PLEURA: Low lung volumes. Interstitial crowding. Mild pulmonary edema. Trace left pleural effusion. No pneumothorax. HEART AND MEDIASTINUM: No acute abnormality of the cardiac and mediastinal silhouettes. BONES AND SOFT TISSUES: Bilateral shoulder degenerative changes. No acute osseous abnormality. IMPRESSION: 1. Low lung volumes. Question trace left pleural effusion.  Recommend repeat PA and lateral view of the chest for further evaluation. Electronically signed by: Morgane Naveau MD 08/26/2024 10:13 PM EST RP Workstation: [id number]     Procedures   Medications Ordered in the ED  aspirin  chewable tablet 324 mg (324 mg Oral Given 08/26/24 2201)  iohexol  (OMNIPAQUE ) 350 MG/ML injection 75 mL (75 mLs Intravenous Contrast Given 08/27/24 0020)  acetaminophen  (TYLENOL ) tablet 1,000 mg (1,000 mg Oral Given 08/27/24 0112)  Medical Decision Making Amount and/or Complexity of Data Reviewed Labs: ordered. Radiology: ordered.  Risk OTC drugs. Prescription drug management.   This patient presents to the ED for concern of chest pain, this involves an extensive number of treatment options, and is a complaint that carries with it a high risk of complications and morbidity.  The differential diagnosis includes musculoskeletal pain, anxiety, ACS, PE, pneumonia, others   Co morbidities / Chronic conditions that complicate the patient evaluation  As noted in HPI   Additional history obtained:  Additional history obtained from EMR External records from outside source obtained and reviewed including neurology notes from March   Lab Tests:  I Ordered, and personally interpreted labs.  The pertinent results include: Mild leukocytosis with a white count of 13,300, initial troponin 21 with a repeat of 19   Imaging Studies ordered:  I ordered imaging studies including chest x-ray and CT angio chest PE study I independently visualized and interpreted imaging which showed  1. No pulmonary embolism or acute pulmonary abnormality.  2. Extensive multivessel coronary artery calcifications.  3. Small hiatal hernia.   I agree with the radiologist interpretation   Cardiac Monitoring: / EKG:  The patient was maintained on a cardiac monitor.  I personally viewed and interpreted the cardiac monitored which showed an underlying  rhythm of: Sinus rhythm   Problem List / ED Course / Critical interventions / Medication management   I ordered medication including aspirin , Tylenol  Reevaluation of the patient after these medicines showed that the patient improved I have reviewed the patients home medicines and have made adjustments as needed   Social Determinants of Health:  Patient is a former smoker   Test / Admission - Considered:  Patient with negative troponins x 2 and a nonischemic EKG.  No sign of ACS at this time.  CT angio chest PE study with no pneumonia, PE, pleural effusion noted.  Presentation not consistent with dissection and no evidence of the same on CT.  Suspect musculoskeletal pain at this time.  No life-threatening cause of chest pain identified.  Plan to recommend outpatient follow-up with cardiology and primary care.  Patient stable for discharge back to his facility with return precautions provided.      Final diagnoses:  Chest pain, unspecified type    ED Discharge Orders     None          Logan Ubaldo KATHEE DEVONNA 08/27/24 0225  "

## 2024-10-21 ENCOUNTER — Ambulatory Visit: Admitting: Neurology
